# Patient Record
Sex: Male | Born: 1965 | Race: Black or African American | Hispanic: No | Smoking: Current every day smoker
Health system: Southern US, Community
[De-identification: ages and names within clinical notes are randomized; demographics above are authoritative.]

## PROBLEM LIST (undated history)

## (undated) DIAGNOSIS — IMO0002 Reserved for concepts with insufficient information to code with codable children: Secondary | ICD-10-CM

## (undated) DIAGNOSIS — R7989 Other specified abnormal findings of blood chemistry: Secondary | ICD-10-CM

## (undated) DIAGNOSIS — M1990 Unspecified osteoarthritis, unspecified site: Secondary | ICD-10-CM

## (undated) DIAGNOSIS — R634 Abnormal weight loss: Secondary | ICD-10-CM

## (undated) DIAGNOSIS — R945 Abnormal results of liver function studies: Secondary | ICD-10-CM

## (undated) DIAGNOSIS — K859 Acute pancreatitis without necrosis or infection, unspecified: Secondary | ICD-10-CM

## (undated) DIAGNOSIS — K219 Gastro-esophageal reflux disease without esophagitis: Secondary | ICD-10-CM

## (undated) DIAGNOSIS — F1010 Alcohol abuse, uncomplicated: Secondary | ICD-10-CM

## (undated) DIAGNOSIS — R51 Headache: Secondary | ICD-10-CM

## (undated) DIAGNOSIS — S62101A Fracture of unspecified carpal bone, right wrist, initial encounter for closed fracture: Secondary | ICD-10-CM

## (undated) DIAGNOSIS — F419 Anxiety disorder, unspecified: Secondary | ICD-10-CM

## (undated) DIAGNOSIS — I10 Essential (primary) hypertension: Secondary | ICD-10-CM

## (undated) DIAGNOSIS — G4700 Insomnia, unspecified: Secondary | ICD-10-CM

## (undated) DIAGNOSIS — H543 Unqualified visual loss, both eyes: Secondary | ICD-10-CM

## (undated) DIAGNOSIS — N390 Urinary tract infection, site not specified: Secondary | ICD-10-CM

## (undated) HISTORY — PX: EYE SURGERY: SHX253

## (undated) HISTORY — DX: Other specified abnormal findings of blood chemistry: R79.89

## (undated) HISTORY — PX: UPPER GASTROINTESTINAL ENDOSCOPY: SHX188

## (undated) HISTORY — PX: FRACTURE SURGERY: SHX138

## (undated) HISTORY — PX: ESOPHAGOGASTRODUODENOSCOPY: SHX1529

## (undated) HISTORY — DX: Abnormal results of liver function studies: R94.5

## (undated) HISTORY — PX: OTHER SURGICAL HISTORY: SHX169

## (undated) HISTORY — PX: MULTIPLE TOOTH EXTRACTIONS: SHX2053

## (undated) HISTORY — PX: COLONOSCOPY: SHX174

---

## 2000-01-17 ENCOUNTER — Emergency Department (HOSPITAL_COMMUNITY): Admission: EM | Admit: 2000-01-17 | Discharge: 2000-01-17 | Payer: Self-pay | Admitting: Emergency Medicine

## 2000-01-18 ENCOUNTER — Ambulatory Visit (HOSPITAL_COMMUNITY): Admission: RE | Admit: 2000-01-18 | Discharge: 2000-01-18 | Payer: Self-pay | Admitting: Ophthalmology

## 2000-10-27 ENCOUNTER — Encounter: Payer: Self-pay | Admitting: Emergency Medicine

## 2000-10-27 ENCOUNTER — Emergency Department (HOSPITAL_COMMUNITY): Admission: EM | Admit: 2000-10-27 | Discharge: 2000-10-27 | Payer: Self-pay | Admitting: Emergency Medicine

## 2000-12-09 ENCOUNTER — Emergency Department (HOSPITAL_COMMUNITY): Admission: EM | Admit: 2000-12-09 | Discharge: 2000-12-09 | Payer: Self-pay | Admitting: Emergency Medicine

## 2003-08-12 ENCOUNTER — Emergency Department (HOSPITAL_COMMUNITY): Admission: EM | Admit: 2003-08-12 | Discharge: 2003-08-12 | Payer: Self-pay | Admitting: Emergency Medicine

## 2004-03-16 ENCOUNTER — Emergency Department (HOSPITAL_COMMUNITY): Admission: EM | Admit: 2004-03-16 | Discharge: 2004-03-16 | Payer: Self-pay | Admitting: Emergency Medicine

## 2004-03-30 ENCOUNTER — Encounter: Admission: RE | Admit: 2004-03-30 | Discharge: 2004-06-04 | Payer: Self-pay | Admitting: Sports Medicine

## 2004-06-05 ENCOUNTER — Encounter: Admission: RE | Admit: 2004-06-05 | Discharge: 2004-07-05 | Payer: Self-pay | Admitting: Sports Medicine

## 2005-03-15 ENCOUNTER — Ambulatory Visit: Payer: Self-pay | Admitting: Family Medicine

## 2005-03-15 ENCOUNTER — Emergency Department (HOSPITAL_COMMUNITY): Admission: EM | Admit: 2005-03-15 | Discharge: 2005-03-15 | Payer: Self-pay | Admitting: *Deleted

## 2005-03-22 ENCOUNTER — Emergency Department (HOSPITAL_COMMUNITY): Admission: EM | Admit: 2005-03-22 | Discharge: 2005-03-22 | Payer: Self-pay | Admitting: Emergency Medicine

## 2005-12-14 ENCOUNTER — Ambulatory Visit: Payer: Self-pay | Admitting: Family Medicine

## 2006-01-19 ENCOUNTER — Ambulatory Visit: Payer: Self-pay | Admitting: Family Medicine

## 2006-02-02 ENCOUNTER — Ambulatory Visit: Payer: Self-pay | Admitting: Family Medicine

## 2006-08-14 ENCOUNTER — Emergency Department (HOSPITAL_COMMUNITY): Admission: EM | Admit: 2006-08-14 | Discharge: 2006-08-14 | Payer: Self-pay | Admitting: Emergency Medicine

## 2008-06-26 ENCOUNTER — Emergency Department (HOSPITAL_COMMUNITY): Admission: EM | Admit: 2008-06-26 | Discharge: 2008-06-26 | Payer: Self-pay | Admitting: Family Medicine

## 2009-03-10 ENCOUNTER — Emergency Department (HOSPITAL_COMMUNITY): Admission: EM | Admit: 2009-03-10 | Discharge: 2009-03-10 | Payer: Self-pay | Admitting: Emergency Medicine

## 2009-08-12 ENCOUNTER — Encounter: Admission: RE | Admit: 2009-08-12 | Discharge: 2009-08-12 | Payer: Self-pay | Admitting: Family Medicine

## 2010-08-01 ENCOUNTER — Inpatient Hospital Stay (INDEPENDENT_AMBULATORY_CARE_PROVIDER_SITE_OTHER)
Admission: RE | Admit: 2010-08-01 | Discharge: 2010-08-01 | Disposition: A | Discharge status: Home / Self Care | Payer: Medicare Other | Source: Ambulatory Visit | Attending: Family Medicine | Admitting: Family Medicine

## 2010-08-01 ENCOUNTER — Ambulatory Visit (INDEPENDENT_AMBULATORY_CARE_PROVIDER_SITE_OTHER): Payer: Medicare Other

## 2010-08-01 DIAGNOSIS — S52509A Unspecified fracture of the lower end of unspecified radius, initial encounter for closed fracture: Secondary | ICD-10-CM

## 2010-08-01 DIAGNOSIS — S52609A Unspecified fracture of lower end of unspecified ulna, initial encounter for closed fracture: Secondary | ICD-10-CM

## 2010-08-03 ENCOUNTER — Emergency Department (HOSPITAL_COMMUNITY)
Admission: EM | Admit: 2010-08-03 | Discharge: 2010-08-03 | Disposition: A | Discharge status: Home / Self Care | Payer: Medicare Other | Attending: Emergency Medicine | Admitting: Emergency Medicine

## 2010-08-03 DIAGNOSIS — S62109A Fracture of unspecified carpal bone, unspecified wrist, initial encounter for closed fracture: Secondary | ICD-10-CM | POA: Insufficient documentation

## 2010-08-03 DIAGNOSIS — X58XXXA Exposure to other specified factors, initial encounter: Secondary | ICD-10-CM | POA: Insufficient documentation

## 2010-08-03 DIAGNOSIS — Y929 Unspecified place or not applicable: Secondary | ICD-10-CM | POA: Insufficient documentation

## 2010-08-03 DIAGNOSIS — R209 Unspecified disturbances of skin sensation: Secondary | ICD-10-CM | POA: Insufficient documentation

## 2010-08-03 DIAGNOSIS — M79609 Pain in unspecified limb: Secondary | ICD-10-CM | POA: Insufficient documentation

## 2010-09-02 ENCOUNTER — Other Ambulatory Visit (HOSPITAL_COMMUNITY): Payer: Self-pay | Admitting: Orthopedic Surgery

## 2010-09-02 ENCOUNTER — Encounter (HOSPITAL_COMMUNITY)
Admission: RE | Admit: 2010-09-02 | Discharge: 2010-09-02 | Disposition: A | Discharge status: Home / Self Care | Payer: Medicare Other | Source: Ambulatory Visit | Attending: Orthopedic Surgery | Admitting: Orthopedic Surgery

## 2010-09-02 ENCOUNTER — Ambulatory Visit (HOSPITAL_COMMUNITY)
Admission: RE | Admit: 2010-09-02 | Discharge: 2010-09-02 | Disposition: A | Discharge status: Home / Self Care | Payer: Medicare Other | Source: Ambulatory Visit | Attending: Orthopedic Surgery | Admitting: Orthopedic Surgery

## 2010-09-02 DIAGNOSIS — Z01812 Encounter for preprocedural laboratory examination: Secondary | ICD-10-CM | POA: Insufficient documentation

## 2010-09-02 DIAGNOSIS — Z01810 Encounter for preprocedural cardiovascular examination: Secondary | ICD-10-CM | POA: Insufficient documentation

## 2010-09-02 DIAGNOSIS — R52 Pain, unspecified: Secondary | ICD-10-CM

## 2010-09-02 DIAGNOSIS — Z01818 Encounter for other preprocedural examination: Secondary | ICD-10-CM | POA: Insufficient documentation

## 2010-09-02 LAB — COMPREHENSIVE METABOLIC PANEL
ALT: 47 U/L (ref 0–53)
AST: 58 U/L — ABNORMAL HIGH (ref 0–37)
Albumin: 4.1 g/dL (ref 3.5–5.2)
Alkaline Phosphatase: 86 U/L (ref 39–117)
BUN: 6 mg/dL (ref 6–23)
CO2: 29 mEq/L (ref 19–32)
Calcium: 9.3 mg/dL (ref 8.4–10.5)
Chloride: 101 mEq/L (ref 96–112)
Creatinine, Ser: 1.12 mg/dL (ref 0.4–1.5)
GFR calc Af Amer: >60 mL/min (ref >60)
GFR calc non Af Amer: >60 mL/min (ref >60)
Glucose, Bld: 91 mg/dL (ref 70–99)
Potassium: 4.1 mEq/L (ref 3.5–5.1)
Sodium: 136 mEq/L (ref 135–145)
Total Bilirubin: 0.5 mg/dL (ref 0.3–1.2)
Total Protein: 6.8 g/dL (ref 6.0–8.3)

## 2010-09-02 LAB — PROTIME-INR
INR: 0.92 (ref 0.00–1.49)
Prothrombin Time: 12.6 seconds (ref 11.6–15.2)

## 2010-09-02 LAB — APTT: aPTT: 28 seconds (ref 24–37)

## 2010-09-02 LAB — CBC
HCT: 40.5 % (ref 39.0–52.0)
Hemoglobin: 14.6 g/dL (ref 13.0–17.0)
MCH: 32.7 pg (ref 26.0–34.0)
MCHC: 36 g/dL (ref 30.0–36.0)
MCV: 90.8 fL (ref 78.0–100.0)
Platelets: 116 10*3/uL — ABNORMAL LOW (ref 150–400)
RBC: 4.46 MIL/uL (ref 4.22–5.81)
RDW: 13.5 % (ref 11.5–15.5)
WBC: 5.5 10*3/uL (ref 4.0–10.5)

## 2010-09-02 LAB — SURGICAL PCR SCREEN
MRSA, PCR: POSITIVE — AB
Staphylococcus aureus: POSITIVE — AB

## 2010-09-04 ENCOUNTER — Observation Stay (HOSPITAL_COMMUNITY)
Admission: RE | Admit: 2010-09-04 | Discharge: 2010-09-05 | Disposition: A | Discharge status: Home / Self Care | Payer: Medicare Other | Source: Ambulatory Visit | Attending: Orthopedic Surgery | Admitting: Orthopedic Surgery

## 2010-09-04 DIAGNOSIS — F17200 Nicotine dependence, unspecified, uncomplicated: Secondary | ICD-10-CM | POA: Insufficient documentation

## 2010-09-04 DIAGNOSIS — IMO0002 Reserved for concepts with insufficient information to code with codable children: Principal | ICD-10-CM | POA: Insufficient documentation

## 2010-09-04 DIAGNOSIS — R05 Cough: Secondary | ICD-10-CM | POA: Insufficient documentation

## 2010-09-04 DIAGNOSIS — H543 Unqualified visual loss, both eyes: Secondary | ICD-10-CM | POA: Insufficient documentation

## 2010-09-04 DIAGNOSIS — R059 Cough, unspecified: Secondary | ICD-10-CM | POA: Insufficient documentation

## 2010-09-04 DIAGNOSIS — I10 Essential (primary) hypertension: Secondary | ICD-10-CM | POA: Insufficient documentation

## 2010-09-13 NOTE — Op Note (Signed)
  NAMEVARNELL, Austin [name]                 ACCOUNT NO.:  [id number]  MEDICAL RECORD NO.:  [id number]           PATIENT TYPE:  O  LOCATION:  5029                         FACILITY:  [facility]  PHYSICIAN:  Nadara Mustard, MD     DATE OF BIRTH:  03-06-1966  DATE OF PROCEDURE:  09/04/2010 DATE OF DISCHARGE:                              OPERATIVE REPORT   PREOPERATIVE DIAGNOSIS:  Displaced right distal radius fracture.  POSTOPERATIVE DIAGNOSIS:  Displaced right distal radius fracture.  PROCEDURE:  Open reduction and internal fixation of right distal radius.  SURGEON:  Nadara Mustard, MD  ANESTHESIA:  General.  ESTIMATED BLOOD LOSS:  Minimal.  ANTIBIOTICS:  1 g of Kefzol.  DRAINS:  None.  COMPLICATIONS:  None.  TOURNIQUET TIME:  Esmarch at the forearm for approximately 30 minutes.  DISPOSITION:  To PACU in stable condition.  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  He underwent closed reduction and obtained essentially anatomic alignment; however, with the patient's weightbearing on the forearm, he developed shortening and displacement and presents at this time for open reduction and internal fixation due to loss of function of the wrist and fingers. Risks and benefits of surgery were discussed including infection, neurovascular injury, persistent pain, need for additional surgery.  The patient states he understands and wishes to proceed at this time.  DESCRIPTION OF PROCEDURE:  The patient was brought to the OR room 4 and underwent a general anesthetic.  After an adequate level of anesthesia was obtained, the patient's right upper extremity was prepped using DuraPrep and draped into a sterile field.  An anterior volar incision of Sherilyn Cooter was used.  After the skin was incised carried bluntly through the soft tissue.  The skin was incised and blunt dissection was carried down to the FCR.  The FCR  sheath was incised and the FCR as well as the neurovascular bundle were retracted ulnarly.  The quadratus was elevated from the radial aspect of radius and elevated ulnarly.  The fracture was identified and osteotome was used to unlock the displaced shortened distal radius fracture.  This was then brought out to length and the patient's anatomic volar tilt was restored.  The patient did not have a normal volar tilt, this was the anatomic volar tilt of the patient.  The volar plate was applied with locking screws distally x4 and compression screws proximally x3.  C-arm fluoroscopy verified reduction in both AP and lateral planes.  The wound was irrigated with normal saline. Esmarch was released.  Hemostasis was obtained.  The subcu was closed using 2-0 Vicryl and the skin was closed using approximated staples.  The wound was covered with Adaptic, orthopedic sponges, Kerlix, and Coban.  The patient was extubated, taken to PACU in stable condition.  Plan for overnight observation.     Nadara Mustard, MD     MVD/MEDQ  D:  09/04/2010  T:  09/05/2010  Job:  [id number]  Electronically Signed by Aldean Baker MD on 09/13/2010 08:12:00 PM

## 2010-10-23 NOTE — Op Note (Signed)
[place]  Patient:    Austin [name], Austin [name]                          MRN: [id number] Proc. Date: 01/18/00 Adm. Date:  [id number] Disc. Date: [id number] Attending:  Ernesto Rutherford                           Operative Report  PREOPERATIVE DIAGNOSES 1. 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 2. 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  POSTOPERATIVE DIAGNOSES  SURGEON:  Ernesto Rutherford, M.D.  ANESTHESIA:  INDICATION:  Patient had been recently discharged from the "penitentiary" and now tells me upon appearing in the emergency room, that he has no way of getting down to [facility] in no way at all.  His preoperative visual acuity in the emergency room is listed as 20/200ths, but the patient reports that his vision had been better than this.  PLANNED PROCEDURE:  Diagnostic vitreous tap of the left eye with injection of intravitreal antibiotics and possible conjunctival wound revision of the left eye from conjunctival retraction in the area of the superotemporal aqueous shunt.  Patient understands the risks of anesthesia including the rare occurrence of death but also to the eye including hemorrhage, infection, scarring, need for further surgery, no change in vision, loss of vision and progressive disease despite intervention.  DESCRIPTION OF PROCEDURE:  After appropriate signed consent was obtained, patient was taken to the operating room where general endotracheal anesthesia was induced without difficulty.  Left periocular region was sterilely prepped in the usual ophthalmic fashion.  Care was taken not to apply any  pressure to the globe.  Diagnostic vitreous tap was then carried out using aspiration with a 26-gauge needle through the pars plana in the superotemporal quadrant away from the shunt devices which had been placed into the anterior chamber.  A small amount of clear fluid and somewhat cloudy fluid appeared to be present. An anterior chamber tap was also carried out and the hypopyon was removed and sent for laboratory testing and cultures.  At this time, conjunctival wound revision was then carried out.  The conjunctiva was tacked down to the limbus with 7-0 Vicryl suture, with care taken to bury the knot.  Intravitreal antibiotics were then applied; vancomycin 1 mg/0.1 cc concentration in a volume of 0.1 cc and ceftazidime 2.25 mg/0.1 cc in a volume of 0.1 cc were injected into the vitreous cavity without difficulty via the pars plana and 30-gauge needle.  This was an attempt to empirically treat the possible endophthalmitis in this only eye of this patient with a profound ocular problem.  Patient was awakened from anesthesia and taken to the recovery room in good and stable condition.  Patient was given intravenous antibiotics and will be sent home postoperatively in one day and his ocular condition monitored carefully. DD: 04/20/00 TD:  04/21/00 Job: [id number]

## 2011-07-26 ENCOUNTER — Emergency Department (INDEPENDENT_AMBULATORY_CARE_PROVIDER_SITE_OTHER)
Admission: EM | Admit: 2011-07-26 | Discharge: 2011-07-26 | Disposition: A | Discharge status: Nursing Home (Includes ICF) | Payer: Medicare Other | Source: Home / Self Care | Attending: Emergency Medicine | Admitting: Emergency Medicine

## 2011-07-26 ENCOUNTER — Emergency Department (HOSPITAL_COMMUNITY)
Admission: EM | Admit: 2011-07-26 | Discharge: 2011-07-26 | Disposition: A | Discharge status: Home / Self Care | Payer: Medicare Other | Attending: Emergency Medicine | Admitting: Emergency Medicine

## 2011-07-26 ENCOUNTER — Encounter (HOSPITAL_COMMUNITY): Payer: Self-pay | Admitting: Emergency Medicine

## 2011-07-26 DIAGNOSIS — Z9889 Other specified postprocedural states: Secondary | ICD-10-CM | POA: Insufficient documentation

## 2011-07-26 DIAGNOSIS — K5289 Other specified noninfective gastroenteritis and colitis: Secondary | ICD-10-CM | POA: Insufficient documentation

## 2011-07-26 DIAGNOSIS — R319 Hematuria, unspecified: Secondary | ICD-10-CM | POA: Insufficient documentation

## 2011-07-26 DIAGNOSIS — I10 Essential (primary) hypertension: Secondary | ICD-10-CM | POA: Insufficient documentation

## 2011-07-26 DIAGNOSIS — Z87828 Personal history of other (healed) physical injury and trauma: Secondary | ICD-10-CM | POA: Insufficient documentation

## 2011-07-26 DIAGNOSIS — M25539 Pain in unspecified wrist: Secondary | ICD-10-CM | POA: Insufficient documentation

## 2011-07-26 DIAGNOSIS — R197 Diarrhea, unspecified: Secondary | ICD-10-CM | POA: Insufficient documentation

## 2011-07-26 DIAGNOSIS — F329 Major depressive disorder, single episode, unspecified: Secondary | ICD-10-CM

## 2011-07-26 DIAGNOSIS — R112 Nausea with vomiting, unspecified: Secondary | ICD-10-CM | POA: Insufficient documentation

## 2011-07-26 DIAGNOSIS — K529 Noninfective gastroenteritis and colitis, unspecified: Secondary | ICD-10-CM

## 2011-07-26 DIAGNOSIS — R42 Dizziness and giddiness: Secondary | ICD-10-CM | POA: Insufficient documentation

## 2011-07-26 HISTORY — DX: Essential (primary) hypertension: I10

## 2011-07-26 HISTORY — DX: Fracture of unspecified carpal bone, right wrist, initial encounter for closed fracture: S62.101A

## 2011-07-26 LAB — POCT I-STAT, CHEM 8
BUN: 5 mg/dL — ABNORMAL LOW (ref 6–23)
Calcium, Ion: 1.15 mmol/L (ref 1.12–1.32)
Chloride: 100 mEq/L (ref 96–112)
Creatinine, Ser: 1 mg/dL (ref 0.50–1.35)
Glucose, Bld: 96 mg/dL (ref 70–99)
HCT: 45 % (ref 39.0–52.0)
Hemoglobin: 15.3 g/dL (ref 13.0–17.0)
Potassium: 3.8 mEq/L (ref 3.5–5.1)
Sodium: 136 mEq/L (ref 135–145)
TCO2: 25 mmol/L (ref 0–100)

## 2011-07-26 LAB — POCT URINALYSIS DIP (DEVICE)
Bilirubin Urine: NEGATIVE
Glucose, UA: NEGATIVE mg/dL
Ketones, ur: NEGATIVE mg/dL
Leukocytes, UA: NEGATIVE
Nitrite: NEGATIVE
Protein, ur: NEGATIVE mg/dL
Specific Gravity, Urine: 1.01 (ref 1.005–1.030)
Urobilinogen, UA: 0.2 mg/dL (ref 0.0–1.0)
pH: 7 (ref 5.0–8.0)

## 2011-07-26 LAB — URINALYSIS, ROUTINE W REFLEX MICROSCOPIC
Bilirubin Urine: NEGATIVE
Glucose, UA: NEGATIVE mg/dL
Ketones, ur: NEGATIVE mg/dL
Leukocytes, UA: NEGATIVE
Nitrite: NEGATIVE
Protein, ur: NEGATIVE mg/dL
Specific Gravity, Urine: 1.003 — ABNORMAL LOW (ref 1.005–1.030)
Urobilinogen, UA: 0.2 mg/dL (ref 0.0–1.0)
pH: 6.5 (ref 5.0–8.0)

## 2011-07-26 LAB — GLUCOSE, CAPILLARY: Glucose-Capillary: 98 mg/dL (ref 70–99)

## 2011-07-26 LAB — CBC
HCT: 39.6 % (ref 39.0–52.0)
Hemoglobin: 14.1 g/dL (ref 13.0–17.0)
MCH: 32.9 pg (ref 26.0–34.0)
MCHC: 35.6 g/dL (ref 30.0–36.0)
MCV: 92.3 fL (ref 78.0–100.0)
Platelets: 128 10*3/uL — ABNORMAL LOW (ref 150–400)
RBC: 4.29 MIL/uL (ref 4.22–5.81)
RDW: 13.3 % (ref 11.5–15.5)
WBC: 8.3 10*3/uL (ref 4.0–10.5)

## 2011-07-26 LAB — BASIC METABOLIC PANEL
BUN: 6 mg/dL (ref 6–23)
Creatinine, Ser: 0.85 mg/dL (ref 0.50–1.35)
GFR calc Af Amer: >90 mL/min (ref >90)
GFR calc non Af Amer: >90 mL/min (ref >90)
Glucose, Bld: 93 mg/dL (ref 70–99)

## 2011-07-26 LAB — URINE MICROSCOPIC-ADD ON: Urine-Other: NONE SEEN

## 2011-07-26 MED ORDER — SODIUM CHLORIDE 0.9 % IV BOLUS (SEPSIS)
1000.0000 mL | Freq: Once | INTRAVENOUS | Status: AC
  Administered 2011-07-26: 1000 mL via INTRAVENOUS

## 2011-07-26 MED ORDER — OXYCODONE-ACETAMINOPHEN 5-325 MG PO TABS: 1.0000 | ORAL_TABLET | ORAL | Status: DC | PRN

## 2011-07-26 MED ORDER — ONDANSETRON 4 MG PO TBDP
ORAL_TABLET | ORAL | Status: AC
  Filled 2011-07-26: qty 1

## 2011-07-26 MED ORDER — SODIUM CHLORIDE 0.9 % IV SOLN
INTRAVENOUS | Status: DC
  Administered 2011-07-26: 17:00:00 via INTRAVENOUS

## 2011-07-26 MED ORDER — ONDANSETRON HCL 4 MG/2ML IJ SOLN
4.0000 mg | Freq: Once | INTRAMUSCULAR | Status: AC
  Administered 2011-07-26: 4 mg via INTRAVENOUS
  Filled 2011-07-26: qty 2

## 2011-07-26 MED ORDER — ONDANSETRON 4 MG PO TBDP
4.0000 mg | ORAL_TABLET | Freq: Once | ORAL | Status: AC
  Administered 2011-07-26: 4 mg via ORAL

## 2011-07-26 NOTE — ED Notes (Signed)
Home health nurse and social worker at bedside.

## 2011-07-26 NOTE — ED Notes (Signed)
Pt sent here from [facility] with hematuria and N/V and fatigue x several days; pt sts was supposed to be taking meds for kidney infection but can not afford; per [facility] c/o about pt being depressed; pt blind

## 2011-07-26 NOTE — ED Notes (Signed)
Patient was sent to obtain a urine specimen , patient could not void.

## 2011-07-26 NOTE — Progress Notes (Signed)
Request made by attending Dr. Effie Shy to obtain home assistance for patient. I met with patient who stated he used to have in home assistance but he lost his Medicaid last year, and has just now been reinstated. Patient further states his ADL's are very limited due to the addition of a severe break to his arm. States he saw Dr. Lajoyce Corners last week and is waiting to hear on a surgery date. For transportation, he relies on [employer] or the possible assistance from a friend to give him a ride. Patient does work for [employer] full-time, but he has been having some problems going to work recently. With patient's permission, I have requested [facility] to meet with patient for evaluations in the home. Dr. Effie Shy is aware of orders necessary for RN, PT, SW. I have left a detailed message with Anibal Henderson of [facility]. I contacted SW Lennox Laity who will meet with patient also.

## 2011-07-26 NOTE — ED Notes (Signed)
PT HERE WITH H/A THAT STARTED X 3 DYS AGO FOLLOWED BY DIARRHEA YESTERDAY AND VOMITING THIS  AM FROM 1-4AM ,WEAKNESS AND FATIGUE.PT HAS HX HTN BUT HASN'T TAKEN MEDS X 1 MNTH.BLURRY VISIO NAND SHAKES ALSO REPORTED.WILL CHECK CBG.BP 164/90

## 2011-07-26 NOTE — ED Provider Notes (Signed)
History     CSN: [id number]  Arrival date & time 07/26/11  0825   First MD Initiated Contact with Patient 07/26/11 [id number]      Chief Complaint  Patient presents with  . GI Problem  . Headache  . Extremity Weakness    (Consider location/radiation/quality/duration/timing/severity/associated sxs/prior treatment) HPI Comments: Patient presents with decreased appetite, malaise over several weeks. Patient states that he has "a lot going on," and is not eating much during this time. States that he is having trouble taking care of himself secondary to degenerating vision, continued pain and difficulty using his right wrist status post fracture last year.  States he lives by himself, and has nobody to take care of him. Patient reports 20-30 pound weight loss during this time, inability to sleep- states he has not slept in over 2 days - decreased energy, loss of interest in usual activities. Reports generalized weakness, headache starting several days ago. Reports multiple episodes of in nbnb vomiting this pain. No abdominal pain. Last bowel movement this morning, was within normal limits for him. No urinary urgency, frequency, dysuria, decreased urine. Patient was recently diagnosed with a "kidney infection" last week, and was prescribed Cipro, which  He was unable to afford, and did not fill this States he ran out of his blood pressure medication month ago. Patient does not remember name medication.  Patient is a 46 y.o. male presenting with weakness. The history is provided by the patient. No language interpreter was used.  Weakness The primary symptoms include nausea and vomiting. Primary symptoms do not include fever. The symptoms began more than 1 week ago.  Additional symptoms include weakness.    Past Medical History  Diagnosis Date  . Hypertension   . Glaucoma   . Wrist fracture, right     Past Surgical History  Procedure Date  . Eye surgery   . Fracture surgery     History  reviewed. No pertinent family history.  History  Substance Use Topics  . Smoking status: Current Everyday Smoker  . Smokeless tobacco: Not on file  . Alcohol Use: Yes      Review of Systems  Constitutional: Positive for appetite change and unexpected weight change. Negative for fever.  Respiratory: Negative for cough, shortness of breath and wheezing.   Cardiovascular: Negative for chest pain.  Gastrointestinal: Positive for nausea and vomiting. Negative for abdominal pain, diarrhea and constipation.  Genitourinary: Negative for dysuria, frequency and decreased urine volume.  Neurological: Positive for weakness.    Allergies  Review of patient's allergies indicates no known allergies.  Home Medications   Current Outpatient Rx  Name Route Sig Dispense Refill  . UNKNOWN TO PATIENT        BP 164/90  Pulse 78  Temp(Src) 98.7 F (37.1 C) (Oral)  Resp 16  SpO2 99%  Physical Exam  Nursing note and vitals reviewed. Constitutional: He is oriented to person, place, and time. He appears well-developed and well-nourished.  HENT:  Head: Normocephalic and atraumatic.  Eyes:       Blind in left eye.  Neck: Normal range of motion.  Cardiovascular: Normal rate, regular rhythm, normal heart sounds and intact distal pulses.   Pulmonary/Chest: Effort normal and breath sounds normal.  Abdominal: Soft. Bowel sounds are normal. He exhibits no distension and no mass. There is no tenderness. There is no rebound, no guarding and no CVA tenderness.  Musculoskeletal: Normal range of motion. He exhibits no edema.  Neurological: He is alert and oriented  to person, place, and time.  Skin: Skin is warm and dry. No rash noted.  Psychiatric: His speech is normal and behavior is normal. Judgment and thought content normal. He exhibits a depressed mood.    ED Course  Procedures (including critical care time)  Labs Reviewed  CBC - Abnormal; Notable for the following:    Platelets 128 (*)     All other components within normal limits  POCT I-STAT, CHEM 8 - Abnormal; Notable for the following:    BUN 5 (*)    All other components within normal limits  POCT URINALYSIS DIP (DEVICE) - Abnormal; Notable for the following:    Hgb urine dipstick LARGE (*)    All other components within normal limits  GLUCOSE, CAPILLARY   No results found.   1. Hematuria   2. Depression    Results for orders placed during the hospital encounter of 07/26/11  GLUCOSE, CAPILLARY      Component Value Range   Glucose-Capillary 98  70 - 99 (mg/dL)  CBC      Component Value Range   WBC 8.3  4.0 - 10.5 (K/uL)   RBC 4.29  4.22 - 5.81 (MIL/uL)   Hemoglobin 14.1  13.0 - 17.0 (g/dL)   HCT 78.2  95.6 - 21.3 (%)   MCV 92.3  78.0 - 100.0 (fL)   MCH 32.9  26.0 - 34.0 (pg)   MCHC 35.6  30.0 - 36.0 (g/dL)   RDW 08.6  57.8 - 46.9 (%)   Platelets 128 (*) 150 - 400 (K/uL)  POCT I-STAT, CHEM 8      Component Value Range   Sodium 136  135 - 145 (mEq/L)   Potassium 3.8  3.5 - 5.1 (mEq/L)   Chloride 100  96 - 112 (mEq/L)   BUN 5 (*) 6 - 23 (mg/dL)   Creatinine, Ser 6.29  0.50 - 1.35 (mg/dL)   Glucose, Bld 96  70 - 99 (mg/dL)   Calcium, Ion 5.28  4.13 - 1.32 (mmol/L)   TCO2 25  0 - 100 (mmol/L)   Hemoglobin 15.3  13.0 - 17.0 (g/dL)   HCT 24.4  01.0 - 27.2 (%)  POCT URINALYSIS DIP (DEVICE)      Component Value Range   Glucose, UA NEGATIVE  NEGATIVE (mg/dL)   Bilirubin Urine NEGATIVE  NEGATIVE    Ketones, ur NEGATIVE  NEGATIVE (mg/dL)   Specific Gravity, Urine 1.010  1.005 - 1.030    Hgb urine dipstick LARGE (*) NEGATIVE    pH 7.0  5.0 - 8.0    Protein, ur NEGATIVE  NEGATIVE (mg/dL)   Urobilinogen, UA 0.2  0.0 - 1.0 (mg/dL)   Nitrite NEGATIVE  NEGATIVE    Leukocytes, UA NEGATIVE  NEGATIVE     .  MDM  Previous records reviewed.  No record of name of blood pressure medicine.  Labs reviewed. Hemoglobin unchanged, electrolytes unchanged. Patient with hematuria. Suspect that this is from untreated  UTI, however, may also be from kidney stone. Is very nauseous here, gave Zofran. Patient tolerating small amounts of soda.  Concern for patient's ability to adequately take care of himself. Patient appears very depressed, but has no explicit suicidal ideation. Transferring to the ER for social work consult, possible behavior health evaluation.   Luiz Blare, MD 07/26/11 1115

## 2011-07-26 NOTE — ED Notes (Signed)
Spoke with pt re: various issues.  Pt denies homicidal/suicidal ideations.  He just wants to feel better physically .  Discussed HH arrangements, pt agreeable.  Pt states that he he has [employer] transportation and is employed by [employer].  Pt has Medicare/Medicaid and will be able to get his meds filled.  Pt says his girlfriend had been getting his groceries, but they had recently broken up.  Food given to pt to take home.  Emotional support offered.

## 2011-07-26 NOTE — Discharge Instructions (Signed)
Stay on a clear liquid diet for one to 2 days.  The home health services will come to your house tomorrow to help you with the problems you have with fixing your foodB.R.A.T. Diet Your doctor has recommended the B.R.A.T. diet for you or your child until the condition improves. This is often used to help control diarrhea and vomiting symptoms. If you or your child can tolerate clear liquids, you may have:  Bananas.   Rice.   Applesauce.   Toast (and other simple starches such as crackers, potatoes, noodles).  Be sure to avoid dairy products, meats, and fatty foods until symptoms are better. Fruit juices such as apple, grape, and prune juice can make diarrhea worse. Avoid these. Continue this diet for 2 days or as instructed by your caregiver. Document Released: 05/24/2005 Document Revised: 02/03/2011 Document Reviewed: 11/10/2006 [facility] Patient Information 2012 [place], [place].Clear Liquid Diet The clear liquid dietconsists of foods that are liquid or will become liquid at room temperature.You should be able to see through the liquid and beverages. Examples of foods allowed on a clear liquid diet include fruit juice, broth or bouillon, gelatin, or frozen ice pops. The purpose of this diet is to provide necessary fluid, electrolytes such a sodium and potassium, and energy to keep the body functioning during times when you are not able to consume a regular diet.A clear liquid diet should not be continued for long periods of time as it is not nutritionally adequate.  REASONS FOR USING A CLEAR LIQUID DIET  In sudden onset (acute) conditions for a patient before or after surgery.   As the first step in oral feeding.   For fluid and electrolyte replacement in diarrheal diseases.   As a diet before certain medical tests are performed.  ADEQUACY The clear liquid diet is adequate only in ascorbic acid, according to the Recommended Dietary Allowances of the [employer]. CHOOSING  FOODS Breads and Starches  Allowed:  None are allowed.   Avoid: All are avoided.  Vegetables  Allowed:  Strained tomato or vegetable juice.   Avoid: Any others.  Fruit  Allowed:  Strained fruit juices and fruit drinks. Include 1 serving of citrus or vitamin C-enriched fruit juice daily.   Avoid: Any others.  Meat and Meat Substitutes  Allowed:  None are allowed.   Avoid: All are avoided.  Milk  Allowed:  None are allowed.   Avoid: All are avoided.  Soups and Combination Foods  Allowed:  Clear bouillon, broth, or strained broth-based soups.   Avoid: Any others.  Desserts and Sweets  Allowed:  Sugar, honey. High protein gelatin. Flavored gelatin, ices, or frozen ice pops that do not contain milk.   Avoid: Any others.  Fats and Oils  Allowed:  None are allowed.   Avoid: All are avoided.  Beverages  Allowed:  Carbonated beverages, cereal beverages, coffee (regular or decaffeinated), or tea.   Avoid: Any others.  Condiments  Allowed:  Iodized salt.   Avoid: Any others, including pepper.  Supplements  Allowed:  Liquid nutrition beverages.   Avoid: Any others that contain lactose or fiber.  SAMPLE MEAL PLAN Breakfast  4 oz strained orange juice.    to 1 cup gelatin (plain or fortified).   1 cup beverage (coffee or tea).   Sugar, if desired.  Midmorning Snack   cup gelatin (plain or fortified).  Lunch  1 cup broth or consomm.   4 oz strained grapefruit juice.    cup gelatin (plain or  fortified).   1 cup beverage (coffee or tea).   Sugar, if desired.  Midafternoon Snack   cup fruit ice.    cup strained fruit juice.  Dinner  1 cup broth or consomm.    cup cranberry juice.    cup flavored gelatin (plain or fortified).   1 cup beverage (coffee or tea).   Sugar, if desired.  Evening Snack  4 oz strained apple juice (vitamin C-fortified).    cup flavored gelatin (plain or fortified).  Document Released: 05/24/2005  Document Revised: 02/03/2011 Document Reviewed: 08/21/2010 [facility] Patient Information 2012 [place], [place]., and the wrist pain. See the doctor of your choice for problems. Return here if needed for problems.  RESOURCE GUIDE  Dental Problems  Patients with Medicaid: [facility] [phone] [address]                                           [phone] [address] Phone:  [phone]                                                  Phone:  [phone]  If unable to pay or uninsured, contact:  [facility] or [facility] to become qualified for the adult dental clinic.  Chronic Pain Problems Contact [facility]  [phone] Patients need to be referred by their primary care doctor.  Insufficient Money for Medicine Contact [employer] Way:  call "211" or [facility] 769-787-6193.  No Primary Care Doctor Call [facility]  [phone] Other agencies that provide inexpensive medical care    [facility]  [phone]    [facility]  [phone]    [facility]  [phone]    [facility]  [phone]    [facility]  [phone]    [facility]  [phone]  Psychological Services [facility] Behavioral Health  [phone] [facility]  [phone] [facility] Mental Health   [phone] (emergency services [phone])  Substance Abuse Resources [facility] and Drug Services  [phone] [facility] [phone] [facility] [phone] Floyd[facility] Flock [phone] Residential & Outpatient Substance Abuse Program  6143645327  Abuse/Neglect [facility] Child Abuse Hotline 512 872 3240 [facility] Child Abuse Hotline (541)719-8881 (After Hours)  Emergency Shelter [facility] (443)380-5917  Maternity Homes [facility] at the [place] [facility] ([phone] [facility] ([phone]  MRSA Hotline #:    (857)203-0581    [facility] Resources  [facility]     [employer]                          [facility] Dept. [address] [place]                       [address]      [address]  [address]  Cristobal Goldmann Phone:  [phone]                                   Phone:  [phone]                 Phone:  [phone]  [facility] Mental Health Phone:  [phone]  [facility] Child Abuse Hotline 413 024 4992 [phone] (After Hours)

## 2011-07-26 NOTE — ED Notes (Signed)
Pt states he has been weak and feels unsteady. He states he has not eaten in 2 days. He is blind and unable to cook for him self. States he saw the nurse at his job last week and was told he had a UTI. He was given a RX for abx but was unable to fill it due to financial reasons.

## 2011-07-26 NOTE — ED Notes (Signed)
Patient given crackers and water per EDP order for PO challenge Patient verbalized understanding.

## 2011-07-26 NOTE — ED Provider Notes (Addendum)
History     CSN: [id number]  Arrival date & time 07/26/11  1149   First MD Initiated Contact with Patient 07/26/11 1426      Chief Complaint  Patient presents with  . Hematuria    (Consider location/radiation/quality/duration/timing/severity/associated sxs/prior treatment) HPI Austin [name] is a 46 y.o. male who presents with nausea, vomiting, diarrhea, and dizziness that started today. 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 He has had help in the home, but none recently. He usually eats when he is at work, during the weekdays, but not on weekends because he cannot prepare his food. He was reportedly diagnosed with a urinary tract infection about 2 weeks ago, was prescribed medication, but did not take it. He states that he had no transportation to allow him to get the medication. Patient is unable to see. The color of his emesis or urine. He was dropped off at the hospital facility today by his sister. He reports stress, but denies depression. He has no flank pain or abdominal pain. He has no fever or chills. He is able to ambulate without falling. He has not tried any medication to improve his symptoms today.    Past Medical History  Diagnosis Date  . Hypertension   . Glaucoma   . Wrist fracture, right     Past Surgical History  Procedure Date  . Eye surgery   . Fracture surgery     History reviewed. No pertinent family history.  History  Substance Use Topics  . Smoking status: Current Everyday Smoker  . Smokeless tobacco: Not on file  . Alcohol Use: Yes      Review of Systems  All other systems reviewed and are negative.    Allergies  Review of patient's allergies indicates no known allergies.  Home Medications   Current Outpatient Rx  Name Route Sig Dispense Refill  . HYDROCODONE-ACETAMINOPHEN 5-325 MG PO TABS Oral Take 2 tablets by mouth every 6 (six) hours as  needed. For pain    . HYDROCORTISONE 1 % EX CREA Topical Apply 1 application topically 2 (two) times daily.      BP 162/90  Pulse 74  Temp(Src) 99.3 F (37.4 C) (Oral)  Resp 18  SpO2 98%  Physical Exam  Nursing note and vitals reviewed. Constitutional: He is oriented to person, place, and time. He appears well-developed and well-nourished.  HENT:  Head: Normocephalic and atraumatic.  Right Ear: External ear normal.  Left Ear: External ear normal.  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  Neck: Normal range of motion and phonation normal. Neck supple.  Cardiovascular: Normal rate, regular rhythm, normal heart sounds and intact distal pulses.   Pulmonary/Chest: Effort normal and breath sounds normal. He exhibits no bony tenderness.  Abdominal: Soft. Normal appearance and bowel sounds are normal. He exhibits no mass. There is no tenderness. There is no guarding.  Musculoskeletal: Normal range of motion.  Neurological: He is alert and oriented to person, place, and time. He has normal strength. No sensory deficit. He exhibits normal muscle tone. Coordination normal.  Skin: Skin is warm, dry and intact.  Psychiatric: He has a normal mood and affect. His behavior is normal. Judgment and thought content normal.    ED Course  Procedures (including critical care time)  Labs  Reviewed  URINALYSIS, ROUTINE W REFLEX MICROSCOPIC - Abnormal; Notable for the following:    Specific Gravity, Urine 1.003 (*)    Hgb urine dipstick MODERATE (*)    All other components within normal limits  URINE MICROSCOPIC-ADD ON  BASIC METABOLIC PANEL  CK TOTAL AND CKMB  URINALYSIS, ROUTINE W REFLEX MICROSCOPIC  URINE CULTURE   No results found. The patient was evaluated in the emergency department with social services. We arranged for him to be assessed in his home by home health for social work PT and nursing evaluation.  1.  Gastroenteritis   2. 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 He may have a gastroenteritis. today, did not vomit or have diarrhea. In emergency department. He was able to tolerate crackers and water in emergency department. Home health services have been arranged for further assessment and to get referrals as needed. Patient stable for discharge. Doubt metabolic instability, serious bacterial infection or colitis        Flint Melter, MD 07/26/11 2032  Flint Melter, MD 07/26/11 2033

## 2011-07-27 ENCOUNTER — Other Ambulatory Visit (HOSPITAL_COMMUNITY): Payer: Self-pay | Admitting: Orthopedic Surgery

## 2011-07-27 LAB — URINE CULTURE
Colony Count: NO GROWTH
Culture  Setup Time: 201302182303
Culture: NO GROWTH

## 2011-08-03 ENCOUNTER — Encounter (HOSPITAL_COMMUNITY): Payer: Self-pay | Admitting: Pharmacy Technician

## 2011-08-03 NOTE — Pre-Procedure Instructions (Signed)
20 Austin [name]  08/03/2011   Your procedure is scheduled on:  Friday, March 1  Report to [facility] at 9:40 AM.  Call this number if you have problems the morning of surgery: [phone]   Remember:   Do not eat food:After Midnight.  May have clear liquids: up to 4 Hours before arrival.  Clear liquids include soda, tea, black coffee, apple or grape juice, broth.  Take these medicines the morning of surgery with A SIP OF WATER: Norco pain pill, Cipro   Do not wear jewelry, make-up or nail polish.  Do not wear lotions, powders, or perfumes. You may wear deodorant.  Do not shave 48 hours prior to surgery.  Do not bring valuables to the hospital.  Contacts, dentures or bridgework may not be worn into surgery.  Leave suitcase in the car. After surgery it may be brought to your room.  For patients admitted to the hospital, checkout time is 11:00 AM the day of discharge.   Patients discharged the day of surgery will not be allowed to drive home.  Name and phone number of your driver:   Special Instructions: Incentive Spirometry - Practice and bring it with you on the day of surgery. and CHG Shower Use Special Wash: 1/2 bottle night before surgery and 1/2 bottle morning of surgery.   Please read over the following fact sheets that you were given: Pain Booklet, Coughing and Deep Breathing and Surgical Site Infection Prevention

## 2011-08-04 ENCOUNTER — Encounter (HOSPITAL_COMMUNITY)
Admission: RE | Admit: 2011-08-04 | Discharge: 2011-08-04 | Disposition: A | Discharge status: Home / Self Care | Payer: Medicare Other | Source: Ambulatory Visit | Attending: Orthopedic Surgery | Admitting: Orthopedic Surgery

## 2011-08-04 ENCOUNTER — Encounter (HOSPITAL_COMMUNITY): Payer: Self-pay

## 2011-08-04 DIAGNOSIS — N390 Urinary tract infection, site not specified: Secondary | ICD-10-CM

## 2011-08-04 HISTORY — DX: Unspecified osteoarthritis, unspecified site: M19.90

## 2011-08-04 HISTORY — DX: Headache: R51

## 2011-08-04 HISTORY — DX: Urinary tract infection, site not specified: N39.0

## 2011-08-04 HISTORY — DX: Anxiety disorder, unspecified: F41.9

## 2011-08-04 HISTORY — DX: Unqualified visual loss, both eyes: H54.3

## 2011-08-04 HISTORY — DX: Reserved for concepts with insufficient information to code with codable children: IMO0002

## 2011-08-04 HISTORY — DX: Abnormal weight loss: R63.4

## 2011-08-04 LAB — COMPREHENSIVE METABOLIC PANEL
Albumin: 3.6 g/dL (ref 3.5–5.2)
Alkaline Phosphatase: 114 U/L (ref 39–117)
BUN: 11 mg/dL (ref 6–23)
Chloride: 103 mEq/L (ref 96–112)
Creatinine, Ser: 0.98 mg/dL (ref 0.50–1.35)
GFR calc Af Amer: >90 mL/min (ref >90)
Glucose, Bld: 114 mg/dL — ABNORMAL HIGH (ref 70–99)
Potassium: 4.2 mEq/L (ref 3.5–5.1)
Total Bilirubin: 0.3 mg/dL (ref 0.3–1.2)

## 2011-08-04 LAB — CBC
HCT: 39.3 % (ref 39.0–52.0)
Hemoglobin: 14.1 g/dL (ref 13.0–17.0)
MCHC: 35.9 g/dL (ref 30.0–36.0)
MCV: 91.4 fL (ref 78.0–100.0)
RDW: 13 % (ref 11.5–15.5)
WBC: 4.9 10*3/uL (ref 4.0–10.5)

## 2011-08-04 LAB — PROTIME-INR
INR: 0.9 (ref 0.00–1.49)
Prothrombin Time: 12.3 seconds (ref 11.6–15.2)

## 2011-08-04 LAB — SURGICAL PCR SCREEN: Staphylococcus aureus: NEGATIVE

## 2011-08-04 NOTE — Progress Notes (Signed)
Notified Dr. Audrie Lia office that pt most likely does not have anyone to stay with him after surgery and he is concerned about how he will manage on his own as he is already having difficulty managing.   Encouraged pt to follow-up with social worker re: assistance.

## 2011-08-05 MED ORDER — CEFAZOLIN SODIUM 1-5 GM-% IV SOLN
1.0000 g | INTRAVENOUS | Status: AC
  Administered 2011-08-06: 1 g via INTRAVENOUS
  Filled 2011-08-05: qty 50

## 2011-08-06 ENCOUNTER — Encounter (HOSPITAL_COMMUNITY): Payer: Self-pay | Admitting: Anesthesiology

## 2011-08-06 ENCOUNTER — Ambulatory Visit (HOSPITAL_COMMUNITY): Payer: Medicare Other | Admitting: Anesthesiology

## 2011-08-06 ENCOUNTER — Ambulatory Visit (HOSPITAL_COMMUNITY)
Admission: RE | Admit: 2011-08-06 | Discharge: 2011-08-07 | Disposition: A | Discharge status: Home / Self Care | Payer: Medicare Other | Source: Ambulatory Visit | Attending: Orthopedic Surgery | Admitting: Orthopedic Surgery

## 2011-08-06 ENCOUNTER — Encounter (HOSPITAL_COMMUNITY)
Admission: RE | Disposition: A | Discharge status: Home / Self Care | Payer: Self-pay | Source: Ambulatory Visit | Attending: Orthopedic Surgery

## 2011-08-06 DIAGNOSIS — I10 Essential (primary) hypertension: Secondary | ICD-10-CM | POA: Insufficient documentation

## 2011-08-06 DIAGNOSIS — M129 Arthropathy, unspecified: Secondary | ICD-10-CM | POA: Insufficient documentation

## 2011-08-06 DIAGNOSIS — T85848A Pain due to other internal prosthetic devices, implants and grafts, initial encounter: Secondary | ICD-10-CM

## 2011-08-06 DIAGNOSIS — F411 Generalized anxiety disorder: Secondary | ICD-10-CM | POA: Insufficient documentation

## 2011-08-06 DIAGNOSIS — F1210 Cannabis abuse, uncomplicated: Secondary | ICD-10-CM | POA: Insufficient documentation

## 2011-08-06 DIAGNOSIS — H543 Unqualified visual loss, both eyes: Secondary | ICD-10-CM | POA: Insufficient documentation

## 2011-08-06 DIAGNOSIS — Z01812 Encounter for preprocedural laboratory examination: Secondary | ICD-10-CM | POA: Insufficient documentation

## 2011-08-06 DIAGNOSIS — Z969 Presence of functional implant, unspecified: Secondary | ICD-10-CM | POA: Diagnosis present

## 2011-08-06 DIAGNOSIS — F17200 Nicotine dependence, unspecified, uncomplicated: Secondary | ICD-10-CM | POA: Insufficient documentation

## 2011-08-06 DIAGNOSIS — Z7901 Long term (current) use of anticoagulants: Secondary | ICD-10-CM | POA: Insufficient documentation

## 2011-08-06 DIAGNOSIS — Z79899 Other long term (current) drug therapy: Secondary | ICD-10-CM | POA: Insufficient documentation

## 2011-08-06 DIAGNOSIS — Y831 Surgical operation with implant of artificial internal device as the cause of abnormal reaction of the patient, or of later complication, without mention of misadventure at the time of the procedure: Secondary | ICD-10-CM | POA: Insufficient documentation

## 2011-08-06 DIAGNOSIS — M79609 Pain in unspecified limb: Secondary | ICD-10-CM | POA: Insufficient documentation

## 2011-08-06 DIAGNOSIS — T8489XA Other specified complication of internal orthopedic prosthetic devices, implants and grafts, initial encounter: Secondary | ICD-10-CM | POA: Insufficient documentation

## 2011-08-06 HISTORY — PX: HARDWARE REMOVAL: SHX979

## 2011-08-06 SURGERY — REMOVAL, HARDWARE
Anesthesia: General | Site: Wrist | Laterality: Right | Wound class: Clean

## 2011-08-06 MED ORDER — PATIENT'S GUIDE TO USING COUMADIN BOOK
Freq: Once | Status: AC
  Administered 2011-08-07: 09:00:00
  Filled 2011-08-06: qty 1

## 2011-08-06 MED ORDER — ONDANSETRON HCL 4 MG/2ML IJ SOLN: 4.0000 mg | Freq: Once | INTRAMUSCULAR | Status: DC | PRN

## 2011-08-06 MED ORDER — METOCLOPRAMIDE HCL 5 MG/ML IJ SOLN: 5.0000 mg | Freq: Three times a day (TID) | INTRAMUSCULAR | Status: DC | PRN

## 2011-08-06 MED ORDER — ONDANSETRON HCL 4 MG PO TABS: 4.0000 mg | ORAL_TABLET | Freq: Four times a day (QID) | ORAL | Status: DC | PRN

## 2011-08-06 MED ORDER — OXYCODONE-ACETAMINOPHEN 5-325 MG PO TABS
1.0000 | ORAL_TABLET | ORAL | Status: DC | PRN
  Administered 2011-08-06 – 2011-08-07 (×4): 2 via ORAL
  Filled 2011-08-06 (×4): qty 2

## 2011-08-06 MED ORDER — WARFARIN VIDEO: Freq: Once | Status: DC

## 2011-08-06 MED ORDER — HYDROCODONE-ACETAMINOPHEN 5-325 MG PO TABS
1.0000 | ORAL_TABLET | ORAL | Status: DC | PRN
  Administered 2011-08-06 – 2011-08-07 (×2): 2 via ORAL
  Filled 2011-08-06 (×2): qty 2

## 2011-08-06 MED ORDER — HYDROMORPHONE HCL PF 1 MG/ML IJ SOLN
0.5000 mg | INTRAMUSCULAR | Status: DC | PRN
  Administered 2011-08-06 – 2011-08-07 (×4): 1 mg via INTRAVENOUS
  Filled 2011-08-06 (×4): qty 1

## 2011-08-06 MED ORDER — LIDOCAINE HCL (CARDIAC) 20 MG/ML IV SOLN
INTRAVENOUS | Status: DC | PRN
  Administered 2011-08-06: 100 mg via INTRAVENOUS

## 2011-08-06 MED ORDER — ONDANSETRON HCL 4 MG/2ML IJ SOLN
INTRAMUSCULAR | Status: DC | PRN
  Administered 2011-08-06: 4 mg via INTRAVENOUS

## 2011-08-06 MED ORDER — METOCLOPRAMIDE HCL 10 MG PO TABS: 5.0000 mg | ORAL_TABLET | Freq: Three times a day (TID) | ORAL | Status: DC | PRN

## 2011-08-06 MED ORDER — LACTATED RINGERS IV SOLN
INTRAVENOUS | Status: DC
  Administered 2011-08-06: 11:00:00 via INTRAVENOUS

## 2011-08-06 MED ORDER — WARFARIN - PHARMACIST DOSING INPATIENT
Freq: Every day | Status: DC
  Administered 2011-08-06: 18:00:00
  Filled 2011-08-06 (×2): qty 1

## 2011-08-06 MED ORDER — CEFAZOLIN SODIUM 1-5 GM-% IV SOLN
1.0000 g | Freq: Four times a day (QID) | INTRAVENOUS | Status: AC
  Administered 2011-08-06 – 2011-08-07 (×3): 1 g via INTRAVENOUS
  Filled 2011-08-06 (×3): qty 50

## 2011-08-06 MED ORDER — 0.9 % SODIUM CHLORIDE (POUR BTL) OPTIME
TOPICAL | Status: DC | PRN
  Administered 2011-08-06: 1000 mL

## 2011-08-06 MED ORDER — HYDROMORPHONE HCL PF 1 MG/ML IJ SOLN
0.2500 mg | INTRAMUSCULAR | Status: DC | PRN
  Administered 2011-08-06 (×2): 0.5 mg via INTRAVENOUS

## 2011-08-06 MED ORDER — ONDANSETRON HCL 4 MG/2ML IJ SOLN: 4.0000 mg | Freq: Four times a day (QID) | INTRAMUSCULAR | Status: DC | PRN

## 2011-08-06 MED ORDER — WARFARIN SODIUM 7.5 MG PO TABS
7.5000 mg | ORAL_TABLET | Freq: Once | ORAL | Status: DC
  Filled 2011-08-06: qty 1

## 2011-08-06 MED ORDER — SODIUM CHLORIDE 0.9 % IV SOLN: INTRAVENOUS | Status: DC

## 2011-08-06 MED ORDER — MIDAZOLAM HCL 5 MG/5ML IJ SOLN
INTRAMUSCULAR | Status: DC | PRN
  Administered 2011-08-06: 2 mg via INTRAVENOUS

## 2011-08-06 MED ORDER — FENTANYL CITRATE 0.05 MG/ML IJ SOLN
INTRAMUSCULAR | Status: DC | PRN
  Administered 2011-08-06 (×2): 50 ug via INTRAVENOUS
  Administered 2011-08-06: 100 ug via INTRAVENOUS
  Administered 2011-08-06: 50 ug via INTRAVENOUS

## 2011-08-06 MED ORDER — OXYCODONE-ACETAMINOPHEN 5-325 MG PO TABS: 1.0000 | ORAL_TABLET | ORAL | Status: AC | PRN

## 2011-08-06 MED ORDER — LACTATED RINGERS IV SOLN
INTRAVENOUS | Status: DC | PRN
  Administered 2011-08-06 (×2): via INTRAVENOUS

## 2011-08-06 MED ORDER — PROPOFOL 10 MG/ML IV EMUL
INTRAVENOUS | Status: DC | PRN
  Administered 2011-08-06: 200 mg via INTRAVENOUS

## 2011-08-06 SURGICAL SUPPLY — 44 items
BANDAGE ELASTIC 4 VELCRO ST LF (GAUZE/BANDAGES/DRESSINGS) IMPLANT
BANDAGE ELASTIC 6 VELCRO ST LF (GAUZE/BANDAGES/DRESSINGS) IMPLANT
BANDAGE ESMARK 6X9 LF (GAUZE/BANDAGES/DRESSINGS) IMPLANT
BANDAGE GAUZE ELAST BULKY 4 IN (GAUZE/BANDAGES/DRESSINGS) ×2 IMPLANT
BNDG CMPR 9X6 STRL LF SNTH (GAUZE/BANDAGES/DRESSINGS)
BNDG COHESIVE 4X5 TAN STRL (GAUZE/BANDAGES/DRESSINGS) IMPLANT
BNDG ESMARK 6X9 LF (GAUZE/BANDAGES/DRESSINGS)
CLOTH BEACON ORANGE TIMEOUT ST (SAFETY) ×2 IMPLANT
COVER SURGICAL LIGHT HANDLE (MISCELLANEOUS) ×2 IMPLANT
CUFF TOURNIQUET SINGLE 34IN LL (TOURNIQUET CUFF) IMPLANT
CUFF TOURNIQUET SINGLE 44IN (TOURNIQUET CUFF) IMPLANT
DRAPE C-ARM 42X72 X-RAY (DRAPES) IMPLANT
DRAPE INCISE IOBAN 66X45 STRL (DRAPES) IMPLANT
DRAPE ORTHO SPLIT 77X108 STRL (DRAPES)
DRAPE SURG ORHT 6 SPLT 77X108 (DRAPES) IMPLANT
DRSG EMULSION OIL 3X3 NADH (GAUZE/BANDAGES/DRESSINGS) ×2 IMPLANT
DRSG PAD ABDOMINAL 8X10 ST (GAUZE/BANDAGES/DRESSINGS) ×2 IMPLANT
ELECT REM PT RETURN 9FT ADLT (ELECTROSURGICAL) ×2
ELECTRODE REM PT RTRN 9FT ADLT (ELECTROSURGICAL) ×1 IMPLANT
GLOVE BIOGEL PI IND STRL 9 (GLOVE) ×1 IMPLANT
GLOVE BIOGEL PI INDICATOR 9 (GLOVE) ×1
GLOVE SURG ORTHO 9.0 STRL STRW (GLOVE) ×2 IMPLANT
GOWN PREVENTION PLUS XLARGE (GOWN DISPOSABLE) ×2 IMPLANT
GOWN SRG XL XLNG 56XLVL 4 (GOWN DISPOSABLE) ×2 IMPLANT
GOWN STRL NON-REIN XL XLG LVL4 (GOWN DISPOSABLE) ×4
KIT BASIN OR (CUSTOM PROCEDURE TRAY) ×2 IMPLANT
KIT ROOM TURNOVER OR (KITS) ×2 IMPLANT
MANIFOLD NEPTUNE II (INSTRUMENTS) ×2 IMPLANT
NS IRRIG 1000ML POUR BTL (IV SOLUTION) ×2 IMPLANT
PACK ORTHO EXTREMITY (CUSTOM PROCEDURE TRAY) ×2 IMPLANT
PAD ARMBOARD 7.5X6 YLW CONV (MISCELLANEOUS) ×4 IMPLANT
PAD CAST 4YDX4 CTTN HI CHSV (CAST SUPPLIES) ×1 IMPLANT
PADDING CAST COTTON 4X4 STRL (CAST SUPPLIES) ×2
SPONGE GAUZE 4X4 12PLY (GAUZE/BANDAGES/DRESSINGS) ×2 IMPLANT
STAPLER VISISTAT 35W (STAPLE) IMPLANT
STOCKINETTE IMPERVIOUS 9X36 MD (GAUZE/BANDAGES/DRESSINGS) IMPLANT
SUT ETHILON 2 0 PSLX (SUTURE) IMPLANT
SUT VIC AB 0 CT1 27 (SUTURE)
SUT VIC AB 0 CT1 27XBRD ANBCTR (SUTURE) IMPLANT
SUT VIC AB 2-0 CT1 27 (SUTURE)
SUT VIC AB 2-0 CT1 TAPERPNT 27 (SUTURE) IMPLANT
TOWEL OR 17X24 6PK STRL BLUE (TOWEL DISPOSABLE) ×2 IMPLANT
TOWEL OR 17X26 10 PK STRL BLUE (TOWEL DISPOSABLE) ×2 IMPLANT
WATER STERILE IRR 1000ML POUR (IV SOLUTION) ×2 IMPLANT

## 2011-08-06 NOTE — Anesthesia Procedure Notes (Signed)
Procedure Name: LMA Insertion Date/Time: 08/06/2011 12:20 PM Performed by: Shirlyn Goltz, TOM Pre-anesthesia Checklist: Patient identified, Emergency Drugs available, Suction available, Patient being monitored and Timeout performed Patient Re-evaluated:Patient Re-evaluated prior to inductionOxygen Delivery Method: Circle system utilized Preoxygenation: Pre-oxygenation with 100% oxygen Intubation Type: IV induction Ventilation: Mask ventilation without difficulty LMA: LMA inserted LMA Size: 4.0 Tube type: Oral Number of attempts: 1 Placement Confirmation: positive ETCO2 and breath sounds checked- equal and bilateral Tube secured with: Tape Dental Injury: Teeth and Oropharynx as per pre-operative assessment

## 2011-08-06 NOTE — Progress Notes (Signed)
ANTICOAGULATION CONSULT NOTE - Initial Consult  Pharmacy Consult for warfarin Indication: DVT prophylaxis post surgery     Assessment: Patient is a 46 yo male who is status post open reduction internal fixation for right distal radius fracture. Patient complains of pain from the retained hardware and presents at this time for removal of deep retained hardware. INR is at baseline of 0.9 will start warfarin for dvt px. Warfarin score is 6.    Goal of Therapy:  INR 2 - 3 unless otherwise specified by MD   Plan:  Warfarin 7.5mg  today INR daily  No Known Allergies   Vital Signs: Temp: 98.3 F (36.8 C) (03/01 1600) Temp src: Oral (03/01 0948) BP: 143/94 mmHg (03/01 1600) Pulse Rate: 54  (03/01 1430)  Labs:  Basename 08/04/11 1440  HGB 14.1  HCT 39.3  PLT 147*  APTT 31  LABPROT 12.3  INR 0.90  HEPARINUNFRC --  CREATININE 0.98  CKTOTAL --  CKMB --  TROPONINI --   CrCl is unknown because there is no height on file for the current visit.  Medical History: Past Medical History  Diagnosis Date  . Glaucoma   . Wrist fracture, right   . Foot and toe(s), blister     bilat feet, blistering between toes  . Weight loss     25 lb wt loss since 08/2010  . Hypertension     hx of  . UTI (lower urinary tract infection) 08/04/11    finishing abx tomorrow  . Headache   . Anxiety   . Arthritis   . Blind in both eyes     sees lights and shadows    Medications:  Prescriptions prior to admission  Medication Sig Dispense Refill  . ciprofloxacin (CIPRO) 500 MG tablet Take 500 mg by mouth 2 (two) times daily. Take for 7 days.  First dose 08/02/2011.      Marland Kitchen HYDROcodone-acetaminophen (NORCO) 5-325 MG per tablet Take 2 tablets by mouth every 6 (six) hours as needed. For pain      . hydrocortisone cream 1 % Apply 1 application topically 2 (two) times daily as needed. For rash.        Severiano Gilbert 08/06/2011,5:32 PM

## 2011-08-06 NOTE — Transfer of Care (Signed)
Immediate Anesthesia Transfer of Care Note  Patient: Austin [name]  Procedure(s) Performed: Procedure(s) (LRB): HARDWARE REMOVAL (Right)  Patient Location: PACU  Anesthesia Type: General  Level of Consciousness: awake, alert , oriented and patient cooperative  Airway & Oxygen Therapy: Patient Spontanous Breathing and Patient connected to nasal cannula oxygen  Post-op Assessment: Report given to PACU RN, Post -op Vital signs reviewed and stable and Patient moving all extremities X 4  Post vital signs: Reviewed and stable  Complications: No apparent anesthesia complications

## 2011-08-06 NOTE — H&P (Signed)
Austin [name] is an 46 y.o. male.   Chief Complaint: Right wrist pain. HPI: Patient is a 20 she'll gentleman who is status post open reduction internal fixation for right distal radius fracture. Patient complains of pain from the retained hardware and presents at this time for removal of deep retained hardware.  Past Medical History  Diagnosis Date  . Glaucoma   . Wrist fracture, right   . Foot and toe(s), blister     bilat feet, blistering between toes  . Weight loss     25 lb wt loss since 08/2010  . Hypertension     hx of  . UTI (lower urinary tract infection) 08/04/11    finishing abx tomorrow  . Headache   . Anxiety   . Arthritis   . Blind in both eyes     sees lights and shadows    Past Surgical History  Procedure Date  . Eye surgery   . Fracture surgery   . Multiple tooth extractions     for dentures  . Foot srugery     removal of bone near small toe    No family history on file. Social History:  reports that he has been smoking Cigars.  He does not have any smokeless tobacco history on file. He reports that he drinks about 3.5 ounces of alcohol per week. He reports that he uses illicit drugs (Marijuana).  Allergies: No Known Allergies  Medications Prior to Admission  Medication Dose Route Frequency Provider Last Rate Last Dose  . ceFAZolin (ANCEF) IVPB 1 g/50 mL premix  1 g Intravenous 60 min Pre-Op Nadara Mustard, MD      . ondansetron [facility]) injection 4 mg  4 mg Intravenous Once Flint Melter, MD   4 mg at 07/26/11 1640  . ondansetron (ZOFRAN-ODT) disintegrating tablet 4 mg  4 mg Oral Once Luiz Blare, MD   4 mg at 07/26/11 1100  . sodium chloride 0.9 % bolus 1,000 mL  1,000 mL Intravenous Once Flint Melter, MD   1,000 mL at 07/26/11 1638  . DISCONTD: 0.9 %  sodium chloride infusion   Intravenous Continuous Flint Melter, MD 125 mL/hr at 07/26/11 1717     Medications Prior to Admission  Medication Sig Dispense Refill  . ciprofloxacin (CIPRO) 500  MG tablet Take 500 mg by mouth 2 (two) times daily. Take for 7 days.  First dose 08/02/2011.      Marland Kitchen HYDROcodone-acetaminophen (NORCO) 5-325 MG per tablet Take 2 tablets by mouth every 6 (six) hours as needed. For pain      . hydrocortisone cream 1 % Apply 1 application topically 2 (two) times daily as needed. For rash.        Results for orders placed during the hospital encounter of 08/04/11 (from the past 48 hour(s))  SURGICAL PCR SCREEN     Status: Normal   Collection Time   08/04/11  2:39 PM      Component Value Range Comment   MRSA, PCR NEGATIVE  NEGATIVE     Staphylococcus aureus NEGATIVE  NEGATIVE     No results found.  Review of Systems  All other systems reviewed and are negative.    Blood pressure 142/93, pulse 70, temperature 98.4 F (36.9 C), temperature source Oral, resp. rate 18, SpO2 100.00%. Physical Exam  Examination of right wrist patient has global tenderness to palpation there is no crepitation with range of motion. Radiographs shows a bursal angulation to the  distal radius. Assessment/Plan Assessment: Painful retained hardware right wrist.  Plan: Patient was to proceed with removal of deep retained hardware. Risks and benefits were discussed including infection neurovascular injury persistent pain potential need for osteotomy for correction of the dorsal tilt. Patient states he understands was pursued this time.  Denym Christenberry V 08/06/2011, 9:50 AM

## 2011-08-06 NOTE — Op Note (Signed)
OPERATIVE REPORT  DATE OF SURGERY: 08/06/2011  PATIENT:  Austin [name],  46 y.o. male  PRE-OPERATIVE DIAGNOSIS:  Painful Deep Retained Hardware Right Wrist  POST-OPERATIVE DIAGNOSIS:  painful deep retained hardware right wrist  PROCEDURE:  Procedure(s): HARDWARE REMOVAL  SURGEON:  Surgeon(s): Nadara Mustard, MD  ANESTHESIA:   general  EBL:  Minimal ML  SPECIMEN:  No Specimen  TOURNIQUET:  * No tourniquets in log *  PROCEDURE DETAILS: Patient is a 46 year old gentleman who is status post ORIF right distal radius fracture. Patient has been complaining of increasing pain from the retained hardware has failed conservative treatment and presents at this time for surgical intervention. Risks and benefits were discussed including infection neurovascular injury persistent pain need for additional surgery. Patient states he understands was pursued this time position of procedure patient brought to OR room 3 and underwent a general anesthetic. After adequate levels and anesthesia obtained patient's right upper extremity was prepped using DuraPrep draped in the sterile field. His previous extensile Sherilyn Cooter approach was used this was carried down to the [employer]. The FCR sheath was incised the FCR was retracted radially and the dissection was carried down to the plate. There was fibrous tissue around the plate which was resected. The plate was removed without complications. The wounds irrigated with normal saline. The Esmarch around the wrist was released after approximately 15 minutes and hemostasis was obtained. The wound was closed using 2-0 nylon. The wound was covered with Adaptic orthopedic sponges Kerlix and Coban. Patient was extubated taken to the PACU in stable condition plan for overnight observation for pain control.  PLAN OF CARE: Admit for overnight observation  PATIENT DISPOSITION:  PACU - hemodynamically stable.   Nadara Mustard, MD 08/06/2011 1:02 PM

## 2011-08-06 NOTE — Anesthesia Preprocedure Evaluation (Addendum)
Anesthesia Evaluation  Patient identified by MRN, date of birth, ID band Patient awake    Reviewed: Allergy & Precautions, H&P , NPO status , Patient's Chart, lab work & pertinent test results  Airway Mallampati: II TM Distance: >3 FB Neck ROM: full    Dental  (+) Edentulous Upper and Edentulous Lower   Pulmonary          Cardiovascular hypertension, regular Normal    Neuro/Psych  Headaches, Anxiety Blindness    GI/Hepatic   Endo/Other    Renal/GU      Musculoskeletal   Abdominal   Peds  Hematology   Anesthesia Other Findings Blind (Glaucoma)  Reproductive/Obstetrics                          Anesthesia Physical Anesthesia Plan  ASA: II  Anesthesia Plan:    Post-op Pain Management:    Induction: Intravenous  Airway Management Planned: Mask and LMA  Additional Equipment:   Intra-op Plan:   Post-operative Plan:   Informed Consent: I have reviewed the patients History and Physical, chart, labs and discussed the procedure including the risks, benefits and alternatives for the proposed anesthesia with the patient or authorized representative who has indicated his/her understanding and acceptance.     Plan Discussed with: CRNA, Anesthesiologist and Surgeon  Anesthesia Plan Comments:         Anesthesia Quick Evaluation

## 2011-08-06 NOTE — Anesthesia Postprocedure Evaluation (Signed)
  Anesthesia Post-op Note  Patient: Austin [name]  Procedure(s) Performed: Procedure(s) (LRB): HARDWARE REMOVAL (Right)  Patient Location: PACU  Anesthesia Type: General  Level of Consciousness: awake, alert , oriented and patient cooperative  Airway and Oxygen Therapy: Patient Spontanous Breathing and Patient connected to nasal cannula oxygen  Post-op Pain: mild  Post-op Assessment: Post-op Vital signs reviewed, Patient's Cardiovascular Status Stable, Respiratory Function Stable, Patent Airway, No signs of Nausea or vomiting and Pain level controlled  Post-op Vital Signs: stable  Complications: No apparent anesthesia complications

## 2011-08-06 NOTE — Preoperative (Signed)
Beta Blockers   Reason not to administer Beta Blockers:Not Applicable 

## 2011-08-07 DIAGNOSIS — Z969 Presence of functional implant, unspecified: Secondary | ICD-10-CM | POA: Diagnosis present

## 2011-08-07 LAB — PROTIME-INR
INR: 0.93 (ref 0.00–1.49)
Prothrombin Time: 12.7 seconds (ref 11.6–15.2)

## 2011-08-07 NOTE — Progress Notes (Addendum)
Subjective: 1 Day Post-Op Procedure(s) (LRB): HARDWARE REMOVAL (Right)  Patient reports pain as mild.    Objective:   VITALS:  Temp:  [97 F (36.1 C)-98.7 F (37.1 C)] 98.4 F (36.9 C) (03/02 0509) Pulse Rate:  [54-77] 56  (03/02 0509) Resp:  [10-20] 18  (03/02 0509) BP: (117-152)/(75-98) 152/78 mmHg (03/02 0509) SpO2:  [96 %-100 %] 100 % (03/02 0509) Weight:  [65.772 kg (145 lb)] 65.772 kg (145 lb) (03/02 0501)  Neurologically intact ABD soft Neurovascular intact Sensation intact distally Intact pulses distally Compartment soft Dressing and splint right forearm intact without drainage or saturation.    LABS  Basename 08/04/11 1440  HGB 14.1  WBC 4.9  PLT 147*    Basename 08/04/11 1440  NA 138  K 4.2  CL 103  CO2 26  BUN 11  CREATININE 0.98  GLUCOSE 114*    Basename 08/07/11 0600 08/04/11 1440  LABPT -- --  INR 0.93 0.90     Assessment/Plan: 1 Day Post-Op Procedure(s) (LRB): HARDWARE REMOVAL (Right)  Advance diet Discharge home with home health D/C warfarin Rx for percocet on chart.  Damani Rando E 08/07/2011, 1:09 PM

## 2011-08-07 NOTE — Discharge Summary (Signed)
Physician Discharge Summary  Patient ID: Austin [name] MRN: [id number] DOB/AGE: 12-Oct-1965 46 y.o.  Admit date: 08/06/2011 Discharge date:   Admission Diagnoses:  Principal Problem:  *Retained orthopedic hardware Deep right forearm.  Discharge Diagnoses:  Same  Past Medical History  Diagnosis Date  . Glaucoma   . Wrist fracture, right   . Foot and toe(s), blister     bilat feet, blistering between toes  . Weight loss     25 lb wt loss since 08/2010  . Hypertension     hx of  . UTI (lower urinary tract infection) 08/04/11    finishing abx tomorrow  . Headache   . Anxiety   . Arthritis   . Blind in both eyes     sees lights and shadows    Surgeries: Procedure(s): HARDWARE REMOVAL on 08/06/2011   Consultants:    Discharged Condition: Improved  Hospital Course: Austin [name] is an 46 y.o. male who was admitted 08/06/2011 with a chief complaint of No chief complaint on file. , and found to have a diagnosis of Retained orthopedic hardware.  They were brought to the operating room on 08/06/2011 and underwent the above named procedures.    They were given perioperative antibiotics:  Anti-infectives     Start     Dose/Rate Route Frequency Ordered Stop   08/06/11 1800   ceFAZolin (ANCEF) IVPB 1 g/50 mL premix        1 g 100 mL/hr over 30 Minutes Intravenous Every 6 hours 08/06/11 1653 08/07/11 0531   08/05/11 1415   ceFAZolin (ANCEF) IVPB 1 g/50 mL premix        1 g 100 mL/hr over 30 Minutes Intravenous 60 min pre-op 08/05/11 1411 08/06/11 1221        .  They were given sequential compression devices, early ambulation, and chemoprophylaxis for DVT prophylaxis.Coumadin initially started then discontinued at discharge. Not indicated for upper extremity procedure. POD#1 alert Oriented x 3. Right forearm dressing dry and Intact splint intact. Right hand warm, without swelling, fingers neurovascular intact. Normal sensation and capillary refill. Excellent active ROM of the right  hand fingers.  They benefited maximally from their hospital stay and there were no complications.    Recent vital signs:  Filed Vitals:   08/07/11 0509  BP: 152/78  Pulse: 56  Temp: 98.4 F (36.9 C)  Resp: 18    Recent laboratory studies:  Results for orders placed during the hospital encounter of 08/06/11  PROTIME-INR      Component Value Range   Prothrombin Time 12.7  11.6 - 15.2 (seconds)   INR 0.93  0.00 - 1.49     Discharge Medications:   Medication List  As of 08/07/2011  1:33 PM   STOP taking these medications         HYDROcodone-acetaminophen 5-325 MG per tablet         TAKE these medications         ciprofloxacin 500 MG tablet   Commonly known as: CIPRO   Take 500 mg by mouth 2 (two) times daily. Take for 7 days.  First dose 08/02/2011.      hydrocortisone cream 1 %   Apply 1 application topically 2 (two) times daily as needed. For rash.      oxyCODONE-acetaminophen 5-325 MG per tablet   Commonly known as: PERCOCET   Take 1 tablet by mouth every 4 (four) hours as needed for pain.  Diagnostic Studies: No results found.  Disposition: 01-Home or Self Care  Discharge Orders    Future Orders Please Complete By Expires   Diet - low sodium heart healthy      Call MD / Call 911      Comments:   If you experience chest pain or shortness of breath, CALL 911 and be transported to the hospital emergency room.  If you develope a fever above 101 F, pus (white drainage) or increased drainage or redness at the wound, or calf pain, call your surgeon's office.   Constipation Prevention      Comments:   Drink plenty of fluids.  Prune juice may be helpful.  You may use a stool softener, such as Colace (over the counter) 100 mg twice a day.  Use MiraLax (over the counter) for constipation as needed.   Increase activity slowly as tolerated      Weight Bearing as taught in Physical Therapy      Comments:   Use a walker or crutches as instructed.       Follow-up Information    Follow up with DUDA,MARCUS V, MD in 2 weeks.   Contact information:   [address] [place] [address] [phone]           Signed: Kerrin Champagne 08/07/2011, 1:33 PM

## 2011-08-07 NOTE — Progress Notes (Signed)
Verbal review of D/C instructions done and pt. Verbalizes understanding. Script given. D/C per w/c in stable condition.

## 2011-08-11 ENCOUNTER — Encounter (HOSPITAL_COMMUNITY): Payer: Self-pay | Admitting: Orthopedic Surgery

## 2012-02-11 ENCOUNTER — Emergency Department (INDEPENDENT_AMBULATORY_CARE_PROVIDER_SITE_OTHER): Payer: Medicare Other

## 2012-02-11 ENCOUNTER — Emergency Department (INDEPENDENT_AMBULATORY_CARE_PROVIDER_SITE_OTHER)
Admission: EM | Admit: 2012-02-11 | Discharge: 2012-02-11 | Disposition: A | Discharge status: Home / Self Care | Payer: Medicare Other | Source: Home / Self Care | Attending: Emergency Medicine | Admitting: Emergency Medicine

## 2012-02-11 ENCOUNTER — Encounter (HOSPITAL_COMMUNITY): Payer: Self-pay | Admitting: Emergency Medicine

## 2012-02-11 DIAGNOSIS — R55 Syncope and collapse: Secondary | ICD-10-CM

## 2012-02-11 DIAGNOSIS — S0101XA Laceration without foreign body of scalp, initial encounter: Secondary | ICD-10-CM

## 2012-02-11 DIAGNOSIS — J029 Acute pharyngitis, unspecified: Secondary | ICD-10-CM

## 2012-02-11 DIAGNOSIS — Z23 Encounter for immunization: Secondary | ICD-10-CM

## 2012-02-11 LAB — POCT I-STAT, CHEM 8
Chloride: 101 mEq/L (ref 96–112)
HCT: 44 % (ref 39.0–52.0)
Hemoglobin: 15 g/dL (ref 13.0–17.0)
Potassium: 4 mEq/L (ref 3.5–5.1)
Sodium: 138 mEq/L (ref 135–145)

## 2012-02-11 MED ORDER — TETANUS-DIPHTH-ACELL PERTUSSIS 5-2.5-18.5 LF-MCG/0.5 IM SUSP
INTRAMUSCULAR | Status: AC
  Filled 2012-02-11: qty 0.5

## 2012-02-11 MED ORDER — TRAMADOL HCL 50 MG PO TABS: 100.0000 mg | ORAL_TABLET | Freq: Three times a day (TID) | ORAL | Status: AC | PRN

## 2012-02-11 MED ORDER — TETANUS-DIPHTH-ACELL PERTUSSIS 5-2.5-18.5 LF-MCG/0.5 IM SUSP
0.5000 mL | Freq: Once | INTRAMUSCULAR | Status: AC
  Administered 2012-02-11: 0.5 mL via INTRAMUSCULAR

## 2012-02-11 NOTE — ED Notes (Signed)
Pt also reports he has had left ear pain and sore throat x 2 weeks.

## 2012-02-11 NOTE — ED Notes (Signed)
Pt got up from bed. Became dizzy and lost his balance. Pt is BLIND. Denies LOC. Has small lac on back of head where he hit his head on a table when he fell. Pt has hx of dizziness and falls in the past. Goes to [facility].

## 2012-02-11 NOTE — ED Provider Notes (Signed)
Chief Complaint  Patient presents with  . Near Syncope  . Otalgia  . Sore Throat    History of Present Illness:   Austin [name] is a 46 year old vision impaired male who had an episode of near-syncope about 11:30 this morning. He had been in bed for the past one to 2 days because of a sore throat. He stood up suddenly and had an episode of near-syncope. He doesn't think he lost consciousness completely. He did fall to the ground and hit the back of his head and has a laceration to his occipital area. He has a moderate headache and feels weak lower. He denies any focal weakness or neurological symptoms. 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 He states that for the past 2 weeks he's had a sore throat with pain in the left side it radiates towards his ear. He's felt dizzy, weak, and had pain on swallowing. He also notes sneezing, cough productive of some sputum, diarrhea, epigastric pain, and chest pain. He denies any fever or chills. He denies any palpitations or prior history of heart disease.  Review of Systems:  Other than noted above, the patient denies any of the following symptoms. Systemic:  No fever, chills, or fatigue. Pulmonary:  No cough, wheezing, shortness of breath. Cardiac:  No chest pain, tightness, pressure, palpitations, PND, orthopnea, or edema. Ext:  No leg pain or swelling. Neuro:  No weakness, paresthesias, or difficulty with speech or gait. No vertigo or difficujlty with coordination or balance. No seizure activity, tongue biting, or incontinence.  Psych:  No anxiety or depression.  PMFSH:  Past medical history, family history, social history, meds, and allergies were reviewed and updated as needed. No history of cardiac disease.  No history of excessive alcohol intake.  No family history of sudden death.  Physical Exam:   Vital signs:  BP 127/85  Pulse 75  Temp 98.4 F (36.9 C) (Oral)  Resp 16  SpO2 98% Filed Vitals:   02/11/12 1330 02/11/12 1355 02/11/12 1400 02/11/12 1401  BP:  143/82 139/73 Supine  132/88 Sitting  127/85 Standing   Pulse: 73 70 70 75  Temp: 98.4 F (36.9 C)     TempSrc: Oral     Resp: 16     SpO2: 98%       Gen:  Alert, oriented, in no distress, skin warm and dry. 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 ENT:  Mucous membranes moist, pharynx clear. Despite his symptoms of severe sore throat, his pharynx appears completely clear. He does have a tiny laceration in the right occipital area. Neck:  Supple, no adenopathy or tenderness.  No JVD.  Thyroid not enlarged. Lungs:  Clear to auscultation, no wheezes, rales or rhonchi.  No respiratory distress. Heart:  Regular rhythm, no extrasystoles.  No gallops, murmers, clicks or rubs. Abdomen:  Soft, nontender, no organomegaly or mass.  Bowel sounds normal.  No pulsatile abdominal mass or bruit. Ext:  No edema. Pulses full and equal. Neuro:  Neurological examination: The patient is alert and oriented x3. Speech is clear, fluent, and appropriate. Cranial nerves are intact. There is no pronator drift and finger to nose was normal. Muscle strength, sensation, and DTRs are normal. Babinskis are downgoing. Station and gait were normal. Romberg sign is negative, patient is able to perform tandem gait well. Skin:  Warm and dry.  No rash.  Labs:   Results for orders placed during the hospital  encounter of 02/11/12  POCT I-STAT, CHEM 8      Component Value Range   Sodium 138  135 - 145 mEq/L   Potassium 4.0  3.5 - 5.1 mEq/L   Chloride 101  96 - 112 mEq/L   BUN 9  6 - 23 mg/dL   Creatinine, Ser 8.11  0.50 - 1.35 mg/dL   Glucose, Bld 78  70 - 99 mg/dL   Calcium, Ion 9.14  7.82 - 1.23 mmol/L   TCO2 23  0 - 100 mmol/L   Hemoglobin 15.0  13.0 - 17.0 g/dL   HCT 95.6  21.3 - 08.6 %     EKG:   Date: 02/11/2012  Rate: 62  Rhythm: normal sinus rhythm  QRS Axis: normal  Intervals: normal  ST/T Wave abnormalities: normal  Conduction Disutrbances:none   Narrative Interpretation: Normal sinus rhythm, normal EKG.  Old EKG Reviewed: none available  Dg Chest 2 View  02/11/2012  *RADIOLOGY REPORT*  Clinical Data: Cough, chest pain, sputum production, dizziness, and near-syncope.  CHEST - 2 VIEW  Comparison: 08/25/2010  Findings: The heart, mediastinal, and hilar contours are normal. Normal pulmonary vascularity.  Trachea is midline. The lungs are well expanded and clear.  Negative for pleural effusion or pneumothorax.  Bilateral cervical ribs at the most inferior cervical spine vertebral body, assumed to be C7, are noted. Visualized thoracic spine vertebral bodies are normal in height and alignment.  No acute bony abnormality.  IMPRESSION:  1.  No acute cardiopulmonary disease. 2.  Bilateral cervical ribs.   Original Report Authenticated By: Britta Mccreedy, M.D.    Procedure Note:  Verbal informed consent was obtained from the patient.  Risks and benefits were outlined with the patient.  Patient understands and accepts these risks.  Identity of the patient was confirmed verbally and by armband.    Procedure was performed as followed:  The small laceration on the right posterior occipital area was prepped with Betadine and closed with a single staple. The patient tolerated this procedure well.  Patient tolerated the procedure well without any immediate complications.   Assessment:  The primary encounter diagnosis was Near syncope. Diagnoses of Scalp laceration and Sore throat were also pertinent to this visit.  His episode of near-syncope he may have been the result of poor oral intake and prolonged immobilization. I am not certain as to the cause of his sore throat since his throat appeared normal despite a complaint of severe sore throat, pain on swallowing, and poor oral intake.  Plan:   1.  The following meds were prescribed:   New Prescriptions   TRAMADOL (ULTRAM) 50 MG TABLET    Take 2 tablets (100 mg total) by mouth every 8 (eight) hours as needed  for pain.   2.  The patient was instructed in symptomatic care and handouts were given. 3.  The patient was told to return if becoming worse in any way, if no better in 3 or 4 days, and given some red flag symptoms including syncope, presyncope, dyspnea, or chest pain that would indicate earlier return.  Follow up:  The patient was told to follow up with Dr. Annalee Genta with regard to his sore throat. In the meantime he was encouraged to get plenty of Ensure and Gatorade.     Reuben Likes, MD 02/11/12 [phone]

## 2012-02-11 NOTE — ED Notes (Signed)
Patient called w questions regarding how to care for his scalp wound and continued pain and bleeding. Was advised to expect some oozing for next 24-48 hours, and would be okay to apply a band aide to his scalp and use an ice pack to help with the discomfort. Was also advised we cannot call something stronger for his injury, as that could mask signs of a worsening head injury condition, but he could take what ever he normally takes as a dose of tylenol for pain and if this does not help his pain then he would need to be rechecked

## 2012-02-18 ENCOUNTER — Emergency Department (INDEPENDENT_AMBULATORY_CARE_PROVIDER_SITE_OTHER)
Admission: EM | Admit: 2012-02-18 | Discharge: 2012-02-18 | Disposition: A | Discharge status: Home / Self Care | Payer: Medicare Other | Source: Home / Self Care | Attending: Emergency Medicine | Admitting: Emergency Medicine

## 2012-02-18 ENCOUNTER — Encounter (HOSPITAL_COMMUNITY): Payer: Self-pay

## 2012-02-18 DIAGNOSIS — Z4802 Encounter for removal of sutures: Secondary | ICD-10-CM

## 2012-02-18 NOTE — ED Notes (Signed)
Pt seen here 02/11/12 for repair of occipital laceration with one staple.  Here today for staple removal.  Denies concerns.

## 2012-02-18 NOTE — ED Provider Notes (Signed)
History     CSN: [id number]  Arrival date & time 02/18/12  5621   First MD Initiated Contact with Patient 02/18/12 1009      Chief Complaint  Patient presents with  . Suture / Staple Removal    (Consider location/radiation/quality/duration/timing/severity/associated sxs/prior treatment) HPI Comments: Patient presents to urgent care today to have a staple removed from his occipital region. Patient denies any tenderness, drainage or any other problem.  Patient is a 46 y.o. male presenting with suture removal. The history is provided by the patient.  Suture / Staple Removal  The sutures were placed 7 to 10 days ago. Treatments since wound repair include antibiotic ointment use. There has been no drainage from the wound. There is no redness present. There is no swelling present. The pain has no pain.    Past Medical History  Diagnosis Date  . Glaucoma   . Wrist fracture, right   . Foot and toe(s), blister     bilat feet, blistering between toes  . Weight loss     25 lb wt loss since 08/2010  . Hypertension     hx of  . UTI (lower urinary tract infection) 08/04/11    finishing abx tomorrow  . Headache   . Anxiety   . Arthritis   . Blind in both eyes     sees lights and shadows    Past Surgical History  Procedure Date  . Eye surgery   . Fracture surgery   . Multiple tooth extractions     for dentures  . Foot srugery     removal of bone near small toe  . Hardware removal 08/06/2011    Procedure: HARDWARE REMOVAL;  Surgeon: Nadara Mustard, MD;  Location: [facility] OR;  Service: Orthopedics;  Laterality: Right;  Removal Deep Retained Hardware Right Distal Radius    No family history on file.  History  Substance Use Topics  . Smoking status: Current Every Day Smoker    Types: Cigars  . Smokeless tobacco: Not on file   Comment: 1/2 - 1 cigar/day  . Alcohol Use: 3.5 oz/week    7 drink(s) per week      Review of Systems  Constitutional: Negative for fever and activity  change.  Respiratory: Negative for shortness of breath.   Skin: Negative for rash.  Neurological: Negative for dizziness and headaches.    Allergies  Review of patient's allergies indicates no known allergies.  Home Medications   Current Outpatient Rx  Name Route Sig Dispense Refill  . TRAMADOL HCL 50 MG PO TABS Oral Take 2 tablets (100 mg total) by mouth every 8 (eight) hours as needed for pain. 30 tablet 0  . CIPROFLOXACIN HCL 500 MG PO TABS Oral Take 500 mg by mouth 2 (two) times daily. Take for 7 days.  First dose 08/02/2011.    Marland Kitchen HYDROCORTISONE 1 % EX CREA Topical Apply 1 application topically 2 (two) times daily as needed. For rash.      BP 150/82  Pulse 69  Temp 98.3 F (36.8 C) (Oral)  Resp 16  SpO2 100%  Physical Exam  Nursing note and vitals reviewed. Constitutional: He appears well-developed and well-nourished.  HENT:  Head: Normocephalic.    Skin: No erythema.    ED Course  Procedures (including critical care time)  Labs Reviewed - No data to display No results found.   1. Visit for suture removal       MDM  Uncomplicated occipital suture removal  Jimmie Molly, MD 02/18/12 1224

## 2012-10-19 DIAGNOSIS — Z947 Corneal transplant status: Secondary | ICD-10-CM | POA: Insufficient documentation

## 2013-03-28 ENCOUNTER — Encounter: Payer: Self-pay | Admitting: Gastroenterology

## 2013-03-29 ENCOUNTER — Encounter: Payer: Self-pay | Admitting: Gastroenterology

## 2013-04-03 ENCOUNTER — Encounter: Payer: Self-pay | Admitting: Gastroenterology

## 2013-04-03 ENCOUNTER — Other Ambulatory Visit (INDEPENDENT_AMBULATORY_CARE_PROVIDER_SITE_OTHER): Payer: Medicare Other

## 2013-04-03 ENCOUNTER — Ambulatory Visit (INDEPENDENT_AMBULATORY_CARE_PROVIDER_SITE_OTHER): Payer: Medicare Other | Admitting: Gastroenterology

## 2013-04-03 VITALS — BP 116/86 | HR 70 | Ht 67.0 in | Wt 143.0 lb

## 2013-04-03 DIAGNOSIS — R634 Abnormal weight loss: Secondary | ICD-10-CM

## 2013-04-03 DIAGNOSIS — G8929 Other chronic pain: Secondary | ICD-10-CM

## 2013-04-03 DIAGNOSIS — R197 Diarrhea, unspecified: Secondary | ICD-10-CM

## 2013-04-03 DIAGNOSIS — R109 Unspecified abdominal pain: Secondary | ICD-10-CM

## 2013-04-03 DIAGNOSIS — R1013 Epigastric pain: Secondary | ICD-10-CM

## 2013-04-03 DIAGNOSIS — F1010 Alcohol abuse, uncomplicated: Secondary | ICD-10-CM

## 2013-04-03 LAB — CBC WITH DIFFERENTIAL/PLATELET
Basophils Absolute: 0 10*3/uL (ref 0.0–0.1)
Basophils Relative: 0.4 % (ref 0.0–3.0)
Eosinophils Absolute: 0.3 10*3/uL (ref 0.0–0.7)
Lymphocytes Relative: 44 % (ref 12.0–46.0)
MCHC: 33.7 g/dL (ref 30.0–36.0)
Neutrophils Relative %: 39 % — ABNORMAL LOW (ref 43.0–77.0)
RBC: 4.54 Mil/uL (ref 4.22–5.81)

## 2013-04-03 LAB — SEDIMENTATION RATE: Sed Rate: 7 mm/hr (ref 0–22)

## 2013-04-03 LAB — HEPATIC FUNCTION PANEL
AST: 68 U/L — ABNORMAL HIGH (ref 0–37)
Albumin: 4 g/dL (ref 3.5–5.2)

## 2013-04-03 LAB — BASIC METABOLIC PANEL
BUN: 9 mg/dL (ref 6–23)
Chloride: 101 mEq/L (ref 96–112)
Potassium: 4.1 mEq/L (ref 3.5–5.1)

## 2013-04-03 LAB — AMYLASE: Amylase: 162 U/L — ABNORMAL HIGH (ref 27–131)

## 2013-04-03 LAB — IBC PANEL
Saturation Ratios: 42.6 % (ref 20.0–50.0)
Transferrin: 251.4 mg/dL (ref 212.0–360.0)

## 2013-04-03 LAB — FERRITIN: Ferritin: 423.7 ng/mL — ABNORMAL HIGH (ref 22.0–322.0)

## 2013-04-03 LAB — TSH: TSH: 1.53 u[IU]/mL (ref 0.35–5.50)

## 2013-04-03 LAB — FOLATE: Folate: 7.2 ng/mL (ref >5.9)

## 2013-04-03 LAB — LIPASE: Lipase: 33 U/L (ref 11.0–59.0)

## 2013-04-03 LAB — VITAMIN B12: Vitamin B-12: 610 pg/mL (ref 211–911)

## 2013-04-03 MED ORDER — OMEPRAZOLE 40 MG PO CPDR: 40.0000 mg | DELAYED_RELEASE_CAPSULE | Freq: Every day | ORAL | Status: DC

## 2013-04-03 NOTE — Progress Notes (Signed)
History of Present Illness:  This is a 47 year old African American male who is blind in both eyes.  He apparently had colonoscopy 2 years ago which was unremarkable.  We do not have this report for review, and he is sent now by Dr. Mirna Mires for evaluation of anorexia and weight loss.  This patient relates that for the last 3 years he's had anorexia,and he may go 3-4 days without eating.  When he does he has pain in his epigastric area with nausea but no emesis.  He denies reflux symptoms or dysphagia or any specific hepatobiliary complaints.  His abdominal pain is not severe and does not awaken him at night.  As mentioned above his had a negative colonoscopy, and he says he has regular bowel movements, but also appears of rather severe bulky-diarrhea..  He denies hematemesis or melena, but he is legally blind.  He apparently uses alcohol daily and heavily but denies a past history of hepatitis or pancreatitis.  He also denies abuse of cigarettes or NSAIDs.  I have no other records for review.  He is accompanied today by his brother who fell asleep during the interview and exam.  I have reviewed this patient's present history, medical and surgical past history, allergies and medications.     ROS:   All systems were reviewed and are negative unless otherwise stated in the HPI.    Physical Exam: Blood pressure 116/86, pulse 70 and regular, weight 143 with a BMI of 22.39.  I am unable to tell if his jaundice because of his eye problems. General well developed well nourished patient in no acute distress, appearing their stated age He has one prosthesis in his left eye and his right eye is bandaged from recent surgery. Skin no lesions noted Neck supple, no adenopathy, no thyroid enlargement, no tenderness Chest clear to percussion and auscultation Heart no significant murmurs, gallops or rubs noted Abdomen no hepatosplenomegaly masses or tenderness, BS normal.  Rectal inspection normal no fissures, or  fistulae noted.  No masses or tenderness on digital exam. Stool guaiac negative. Extremities no acute joint lesions, edema, phlebitis or evidence of cellulitis. Neurologic patient oriented x 3, cranial nerves intact, no focal neurologic deficits noted. Psychological mental status normal and normal affect.  Assessment and plan: I cannot appreciate hepatosplenomegaly or any abdominal masses.  His anorexia and weight loss certainly suggests possibility of either chronic alcoholic pancreatitis or pancreatic cancer.  Other considerations would be gastric carcinoma or some type of structural/ inflammatory upper GI lesion.  I've sent him by the lab for a complete set of lab tests including amylase and lipase, and scheduled CT scan of the abdomen and pelvis.  He probably will need endoscopic exam with these been completed.  In the interim, I placed him on Dexilant 60 mg every morning.  This patient has some type of serious underlying medical days gastrointestinal illness with an alleged 40-50 pound weight loss over the last several years.  He does complain of periodic diarrhea, but this is not his main complaint.  There may be an element of associated chronic malabsorption.

## 2013-04-03 NOTE — Patient Instructions (Addendum)
You have been given a separate informational sheet regarding your tobacco use, the importance of quitting and local resources to help you quit.   You have been scheduled for a CT scan of the abdomen and pelvis at [facility] ([address]---this is in the same building as [facility]).   You are scheduled on 04-06-2013 at 230 pm. You should arrive 15 minutes prior to your appointment time for registration. Please follow the written instructions below on the day of your exam:  WARNING: IF YOU ARE ALLERGIC TO IODINE/X-RAY DYE, PLEASE NOTIFY RADIOLOGY IMMEDIATELY AT [phone]! YOU WILL BE GIVEN A 13 HOUR PREMEDICATION PREP.  1) Do not eat or drink anything after 1030 am (4 hours prior to your test) 2) You have been given 2 bottles of oral contrast to drink. The solution may taste better if refrigerated, but do NOT add ice or any other liquid to this solution. Shake  well before drinking.    Drink 1 bottle of contrast @ 1230 pm (2 hours prior to your exam)  Drink 1 bottle of contrast @ 130 pm (1 hour prior to your exam)  You may take any medications as prescribed with a small amount of water except for the following: Metformin, Glucophage, Glucovance, Avandamet, Riomet, Fortamet, Actoplus Met, Janumet, Glumetza or Metaglip. The above medications must be held the day of the exam AND 48 hours after the exam.  The purpose of you drinking the oral contrast is to aid in the visualization of your intestinal tract. The contrast solution may cause some diarrhea. Before your exam is started, you will be given a small amount of fluid to drink. Depending on your individual set of symptoms, you may also receive an intravenous injection of x-ray contrast/dye. Plan on being at [facility] for 30 minutes or long, depending on the type of exam you are having performed.  This test typically takes 30-45 minutes to complete.  If you have any questions regarding your exam or if you need  to reschedule, you may call the CT department at [phone] between the hours of 8:00 am and 5:00 pm, Monday-Friday.  _______________________________________________________________________________________________________________________________________  Dr. Loleta Chance does not have record for your Colonoscopy report, if you remember who did the procedure please call office so we may obtain that record.  We have sent the following medications to your pharmacy for you to pick up at your convenience: Omeprazole 40 mg, please take one tablet by mouth once daily   Your physician has requested that you go to the basement for the following lab work before leaving today: Amylase Lipase  Sedimentation Rate  Anemia Panel Liver Function Panel CBC TSH BMP

## 2013-04-04 ENCOUNTER — Encounter: Payer: Medicare Other | Admitting: Gastroenterology

## 2013-04-06 ENCOUNTER — Other Ambulatory Visit: Payer: Medicare Other

## 2013-04-06 ENCOUNTER — Telehealth: Payer: Self-pay | Admitting: Gastroenterology

## 2013-04-06 NOTE — Telephone Encounter (Signed)
lmom for pt to call back; informed Rose at [facility]

## 2013-04-10 ENCOUNTER — Ambulatory Visit (HOSPITAL_COMMUNITY)
Admission: RE | Admit: 2013-04-10 | Discharge: 2013-04-10 | Disposition: A | Discharge status: Home / Self Care | Payer: Medicare Other | Source: Ambulatory Visit | Attending: Gastroenterology | Admitting: Gastroenterology

## 2013-04-10 DIAGNOSIS — R109 Unspecified abdominal pain: Secondary | ICD-10-CM

## 2013-04-10 MED ORDER — IOHEXOL 300 MG/ML  SOLN
100.0000 mL | Freq: Once | INTRAMUSCULAR | Status: AC | PRN
  Administered 2013-04-10: 100 mL via INTRAVENOUS

## 2013-04-11 ENCOUNTER — Telehealth: Payer: Self-pay | Admitting: *Deleted

## 2013-04-11 DIAGNOSIS — K760 Fatty (change of) liver, not elsewhere classified: Secondary | ICD-10-CM

## 2013-04-11 NOTE — Telephone Encounter (Signed)
Pt r/s

## 2013-04-11 NOTE — Telephone Encounter (Signed)
Informed pt of the need for EGD and Hemochromatosis lab. He will come for PV and lab on 04/16/13 and his EGD is on 04/18/13. Also discussed pt's CT and the need for him to cut back on his drinking. He admits to 1-2 beers a day and he drinks liquor on the weekends. He will attempt to cut back.

## 2013-04-11 NOTE — Telephone Encounter (Signed)
Message copied by Florene Glen on Wed Apr 11, 2013  2:33 PM ------      Message from: Jarold Motto, DAVID R      Created: Tue Apr 10, 2013  4:17 PM       CT scan labs are unremarkable. He needs to be scheduled for endoscopic exam. ------

## 2013-04-16 ENCOUNTER — Ambulatory Visit (AMBULATORY_SURGERY_CENTER): Payer: Self-pay | Admitting: *Deleted

## 2013-04-16 ENCOUNTER — Other Ambulatory Visit: Payer: Medicare Other

## 2013-04-16 ENCOUNTER — Encounter: Payer: Medicare Other | Admitting: Gastroenterology

## 2013-04-16 ENCOUNTER — Other Ambulatory Visit: Payer: Self-pay | Admitting: Gastroenterology

## 2013-04-16 VITALS — Ht 67.0 in | Wt 150.6 lb

## 2013-04-16 DIAGNOSIS — R109 Unspecified abdominal pain: Secondary | ICD-10-CM

## 2013-04-16 NOTE — Progress Notes (Signed)
No allergies to eggs or soy. No problems with anesthesia.  

## 2013-04-18 ENCOUNTER — Ambulatory Visit (AMBULATORY_SURGERY_CENTER): Payer: Medicare Other | Admitting: Gastroenterology

## 2013-04-18 ENCOUNTER — Telehealth: Payer: Self-pay | Admitting: *Deleted

## 2013-04-18 ENCOUNTER — Encounter: Payer: Self-pay | Admitting: Gastroenterology

## 2013-04-18 VITALS — BP 132/90 | HR 69 | Temp 98.6°F | Resp 17 | Ht 67.0 in | Wt 150.0 lb

## 2013-04-18 DIAGNOSIS — R109 Unspecified abdominal pain: Secondary | ICD-10-CM

## 2013-04-18 DIAGNOSIS — K2970 Gastritis, unspecified, without bleeding: Secondary | ICD-10-CM

## 2013-04-18 DIAGNOSIS — R112 Nausea with vomiting, unspecified: Secondary | ICD-10-CM

## 2013-04-18 DIAGNOSIS — R634 Abnormal weight loss: Secondary | ICD-10-CM

## 2013-04-18 MED ORDER — SODIUM CHLORIDE 0.9 % IV SOLN: 500.0000 mL | INTRAVENOUS | Status: DC

## 2013-04-18 NOTE — Progress Notes (Signed)
Patient did not experience any of the following events: a burn prior to discharge; a fall within the facility; wrong site/side/patient/procedure/implant event; or a hospital transfer or hospital admission upon discharge from the facility. (G8907) Patient did not have preoperative order for IV antibiotic SSI prophylaxis. (G8918)  

## 2013-04-18 NOTE — Progress Notes (Signed)
Lidocaine-40mg IV prior to Propofol InductionPropofol given over incremental dosages 

## 2013-04-18 NOTE — Op Note (Signed)
[facility] [address] [place], [address]   ENDOSCOPY PROCEDURE REPORT  PATIENT: [name], Austin Robles  MR#: [id number] BIRTHDATE: Apr 01, 1966 , 47  yrs. old GENDER: Male ENDOSCOPIST:Evanee Lubrano Hale Bogus, MD, Clementeen Graham REFERRED BY: Mirna Mires, M.D. PROCEDURE DATE:  04/18/2013 PROCEDURE:   EGD w/ biopsy for H.pylori ASA CLASS:    Class II INDICATIONS: Nausea and Epigastric pain. MEDICATION: propofol (Diprivan) 250mg  IV TOPICAL ANESTHETIC:   Cetacaine Spray  DESCRIPTION OF PROCEDURE:   After the risks and benefits of the procedure were explained, informed consent was obtained.  The LB [id number]  endoscope was introduced through the mouth  and advanced to the second portion of the duodenum .  The instrument was slowly withdrawn as the mucosa was fully examined.      DUODENUM: The duodenal mucosa showed no abnormalities in the bulb and second portion of the duodenum.  STOMACH: There was mild antral gastropathy noted.  Cold forcep biopsies were taken at the antrum.  ESOPHAGUS: The mucosa of the esophagus appeared normal. Retroflexed views revealed no abnormalities.    The scope was then withdrawn from the patient and the procedure completed.  COMPLICATIONS: There were no complications.   ENDOSCOPIC IMPRESSION: 1.   The duodenal mucosa showed no abnormalities in the bulb and second portion of the duodenum 2.   There was mild antral gastropathy noted [T2] ...r/o H.pylori 3.   The mucosa of the esophagus appeared normal 4 . Abdomonal pain ?? etiology... CT scan normal.  RECOMMENDATIONS: 1.  Await pathology results 2.  Continue current medications 3. schedule gastric emptying scan.Marland KitchenMarland Kitchen?? functional problems,may need psych evaluation.    _______________________________ eSignedMardella Layman, MD, [facility] 04/18/2013 1:44 PM   standard discharge

## 2013-04-18 NOTE — Progress Notes (Signed)
Called to room to assist during endoscopic procedure.  Patient ID and intended procedure confirmed with present staff. Received instructions for my participation in the procedure from the performing physician.  

## 2013-04-18 NOTE — Patient Instructions (Addendum)
YOU HAD AN ENDOSCOPIC PROCEDURE TODAY AT THE [facility]: Refer to the procedure report that was given to you for any specific questions about what was found during the examination.  If the procedure report does not answer your questions, please call your gastroenterologist to clarify.  If you requested that your care partner not be given the details of your procedure findings, then the procedure report has been included in a sealed envelope for you to review at your convenience later.  YOU SHOULD EXPECT: Some feelings of bloating in the abdomen. Passage of more gas than usual.  Walking can help get rid of the air that was put into your GI tract during the procedure and reduce the bloating. If you had a lower endoscopy (such as a colonoscopy or flexible sigmoidoscopy) you may notice spotting of blood in your stool or on the toilet paper. If you underwent a bowel prep for your procedure, then you may not have a normal bowel movement for a few days.  DIET: Your first meal following the procedure should be a light meal and then it is ok to progress to your normal diet.  A half-sandwich or bowl of soup is an example of a good first meal.  Heavy or fried foods are harder to digest and may make you feel nauseous or bloated.  Likewise meals heavy in dairy and vegetables can cause extra gas to form and this can also increase the bloating.  Drink plenty of fluids but you should avoid alcoholic beverages for 24 hours.  ACTIVITY: Your care partner should take you home directly after the procedure.  You should plan to take it easy, moving slowly for the rest of the day.  You can resume normal activity the day after the procedure however you should NOT DRIVE or use heavy machinery for 24 hours (because of the sedation medicines used during the test).    SYMPTOMS TO REPORT IMMEDIATELY: A gastroenterologist can be reached at any hour.  During normal business hours, 8:30 AM to 5:00 PM Monday through Friday,  call [phone].  After hours and on weekends, please call the GI answering service at [phone] who will take a message and have the physician on call contact you.    Following upper endoscopy (EGD)  Vomiting of blood or coffee ground material  New chest pain or pain under the shoulder blades  Painful or persistently difficult swallowing  New shortness of breath  Fever of 100F or higher  Black, tarry-looking stools  FOLLOW UP: If any biopsies were taken you will be contacted by phone or by letter within the next 1-3 weeks.  Call your gastroenterologist if you have not heard about the biopsies in 3 weeks.  Our staff will call the home number listed on your records the next business day following your procedure to check on you and address any questions or concerns that you may have at that time regarding the information given to you following your procedure. This is a courtesy call and so if there is no answer at the home number and we have not heard from you through the emergency physician on call, we will assume that you have returned to your regular daily activities without incident.  SIGNATURES/CONFIDENTIALITY: You and/or your care partner have signed paperwork which will be entered into your electronic medical record.  These signatures attest to the fact that that the information above on your After Visit Summary has been reviewed and is understood.  Full   responsibility of the confidentiality of this discharge information lies with you and/or your care-partner.  Resume medications. Arrive at [facility] at 7:15 a.m. November 28th Radiology, Nothing after midnight. DO Not take  OMEPRAZOLE 40 MG November 28TH

## 2013-04-18 NOTE — Telephone Encounter (Signed)
Per Dr Jarold Motto, schedule for [facility]. 05/04/13 arrive at 07:15am at [facility], Npo after midnight, no prilosec. Shelia in [facility] will notify the pt.

## 2013-04-19 ENCOUNTER — Telehealth: Payer: Self-pay

## 2013-04-19 NOTE — Telephone Encounter (Signed)
  Follow up Call-  Call back number 04/18/2013  Post procedure Call Back phone  # [phone]  Permission to leave phone message Yes     Patient questions:  Do you have a fever, pain , or abdominal swelling? no Pain Score  0 *  Have you tolerated food without any problems? yes  Have you been able to return to your normal activities? yes  Do you have any questions about your discharge instructions: Diet   no Medications  no Follow up visit  no  Do you have questions or concerns about your Care? no  Actions: * If pain score is 4 or above: No action needed, pain <4.  No problems per the pt. Maw

## 2013-04-20 ENCOUNTER — Encounter: Payer: Self-pay | Admitting: Gastroenterology

## 2013-04-20 LAB — HELICOBACTER PYLORI SCREEN-BIOPSY: UREASE: NEGATIVE

## 2013-05-04 ENCOUNTER — Ambulatory Visit (HOSPITAL_COMMUNITY): Payer: Medicare Other

## 2013-05-15 ENCOUNTER — Encounter (HOSPITAL_COMMUNITY): Admission: RE | Admit: 2013-05-15 | Payer: Medicare Other | Source: Ambulatory Visit

## 2014-06-06 ENCOUNTER — Encounter (HOSPITAL_COMMUNITY): Payer: Self-pay | Admitting: Emergency Medicine

## 2014-06-06 ENCOUNTER — Emergency Department (INDEPENDENT_AMBULATORY_CARE_PROVIDER_SITE_OTHER)
Admission: EM | Admit: 2014-06-06 | Discharge: 2014-06-06 | Disposition: A | Discharge status: Home / Self Care | Payer: Medicare Other | Source: Home / Self Care | Attending: Emergency Medicine | Admitting: Emergency Medicine

## 2014-06-06 DIAGNOSIS — M79672 Pain in left foot: Secondary | ICD-10-CM | POA: Diagnosis not present

## 2014-06-06 DIAGNOSIS — L089 Local infection of the skin and subcutaneous tissue, unspecified: Secondary | ICD-10-CM | POA: Diagnosis not present

## 2014-06-06 MED ORDER — CEPHALEXIN 500 MG PO CAPS: 500.0000 mg | ORAL_CAPSULE | Freq: Four times a day (QID) | ORAL | Status: DC

## 2014-06-06 MED ORDER — MUPIROCIN CALCIUM 2 % NA OINT
TOPICAL_OINTMENT | NASAL | Status: DC
Start: 2014-06-06 — End: 2015-10-10

## 2014-06-06 MED ORDER — BACITRACIN 500 UNIT/GM EX OINT
1.0000 "application " | TOPICAL_OINTMENT | Freq: Once | CUTANEOUS | Status: AC
  Administered 2014-06-06: 1 via TOPICAL

## 2014-06-06 MED ORDER — HYDROCODONE-ACETAMINOPHEN 5-325 MG PO TABS
1.0000 | ORAL_TABLET | ORAL | Status: DC | PRN
Start: 2014-06-06 — End: 2015-10-10

## 2014-06-06 NOTE — Discharge Instructions (Signed)
Keep toes spaces dry. Place cotton between toes and change every 2 hours. Use a very small amount of the ointment twice a day. See your podiatrist next week.

## 2014-06-06 NOTE — ED Notes (Signed)
Noticed a wound or cut btween two toes about 3-4 days ago, now reports noticing odor to wound, wound is draining.  Patient has been soaking in epsom salts, applying topical antibiotic

## 2014-06-06 NOTE — ED Provider Notes (Signed)
CSN: [id number]     Arrival date & time 06/06/14  1350 History   First MD Initiated Contact with Patient 06/06/14 1444     Chief Complaint  Patient presents with  . Foot Pain   (Consider location/radiation/quality/duration/timing/severity/associated sxs/prior Treatment) HPI Comments: 48 year old male complaining of pain to the left foot. Approximately 10 days ago he noticed an area between the fifth and fourth toes of the left foot that were macerated and painful. In the past 3 days the localized pain has been getting worse. He is complaining of pain mainly to the lateral and dorsal SPECT of the foot. He denies any known injury. The left fifth toe is medially displaced place pressure on the adjacent toe. This causes skin to skin contact on a continuous basis bus skin breakdown and maceration appearance. The patient believes it to be infected and smells and odor that he believes his infection. He has been soaking the foot and saw and vinegar tainted water. This is a recurring problem.   Past Medical History  Diagnosis Date  . Glaucoma   . Wrist fracture, right   . Foot and toe(s), blister     bilat feet, blistering between toes  . Weight loss     25 lb wt loss since 08/2010  . Hypertension     hx of  . UTI (lower urinary tract infection) 08/04/11    finishing abx tomorrow  . Headache(784.0)   . Anxiety   . Arthritis   . Blind in both eyes     sees lights and shadows   Past Surgical History  Procedure Laterality Date  . Eye surgery Left   . Fracture surgery Right     wrist  . Multiple tooth extractions      for dentures  . Foot srugery      removal of bone near small toe  . Hardware removal  08/06/2011    Procedure: HARDWARE REMOVAL;  Surgeon: Newt Minion, MD;  Location: [facility];  Service: Orthopedics;  Laterality: Right;  Removal Deep Retained Hardware Right Distal Radius  . Eye surgery Right     prostetic eye   Family History  Problem Relation Age of Onset  . Diabetes Sister    . Diabetes Maternal Aunt     aunt and uncles  . Hypertension Sister     aunt and uncles maternal side  . Colon cancer Neg Hx    History  Substance Use Topics  . Smoking status: Current Every Day Smoker -- 1.00 packs/day    Types: Cigarettes, Cigars  . Smokeless tobacco: Never Used     Comment: 1/2 - 1 cigar/day  . Alcohol Use: 3.5 oz/week    7 drink(s) per week     Comment: mostly on the weekends occasionally through the week    Review of Systems  Constitutional: Negative.   Eyes: Positive for visual disturbance.  Respiratory: Negative.   Cardiovascular: Negative for chest pain.  Skin: Positive for wound.       As per HPI    Allergies  Review of patient's allergies indicates no known allergies.  Home Medications   Prior to Admission medications   Medication Sig Start Date End Date Taking? Authorizing Provider  brimonidine (ALPHAGAN) 0.2 % ophthalmic solution Place 1 drop into the left eye 2 (two) times daily.    Historical Provider, MD  cephALEXin (KEFLEX) 500 MG capsule Take 1 capsule (500 mg total) by mouth 4 (four) times daily. 06/06/14   Janne Napoleon,  NP  HYDROcodone-acetaminophen (NORCO/VICODIN) 5-325 MG per tablet Take 1 tablet by mouth every 4 (four) hours as needed. 06/06/14   Janne Napoleon, NP  mupirocin nasal ointment (BACTROBAN) 2 % Apply very small amount between toes bid 06/06/14   Janne Napoleon, NP  prednisoLONE acetate (PRED FORTE) 1 % ophthalmic suspension Place 1 drop into the left eye 2 (two) times daily.    Historical Provider, MD  timolol (BETIMOL) 0.5 % ophthalmic solution Place 1 drop into the left eye 2 (two) times daily.    Historical Provider, MD   BP 149/92 mmHg  Pulse 78  Temp(Src) 98.3 F (36.8 C) (Oral)  Resp 20  SpO2 98% Physical Exam  Constitutional: He is oriented to person, place, and time. He appears well-developed. No distress.  Eyes:  Blind  Cardiovascular: Normal rate.   Pulmonary/Chest: Effort normal. No respiratory distress.   Neurological: He is alert and oriented to person, place, and time.  Skin:  Skin between the 4th and 5th toes of left foot macerated, white. No current drainage, bleeding or exudate. The 5th toe is swollen and tender with minimal redness. Tenderness to the 4th toe. Pain and tenderness to the dorsilateral aspect of the forefoot. No deformity or swelling.  No redness or other discoloration.  5th digit digit anatomically deviated medially and rotated outward. No lymphangitis.  Nursing note and vitals reviewed.   ED Course  Procedures (including critical care time) Labs Review Labs Reviewed - No data to display  Imaging Review No results found.   MDM   1. Foot pain, left   2. Toe infection     Will place a very small amount of bacitracin ointment between the toes and then line with iodoform calls and cotton for separation. Patient instructed to keep his toes dry he may continue to use his solution for soaking bit make sure it stays dry Keflex as directed. He appears in ointment as directed, Norco 5 mg #15. Follow-up with podiatrist next week. \     Janne Napoleon, NP 06/06/14 [phone]

## 2014-08-15 ENCOUNTER — Emergency Department (HOSPITAL_COMMUNITY): Payer: Medicare Other

## 2014-08-15 ENCOUNTER — Emergency Department (HOSPITAL_COMMUNITY)
Admission: EM | Admit: 2014-08-15 | Discharge: 2014-08-15 | Disposition: A | Discharge status: Home / Self Care | Payer: Medicare Other | Attending: Emergency Medicine | Admitting: Emergency Medicine

## 2014-08-15 ENCOUNTER — Encounter (HOSPITAL_COMMUNITY): Payer: Self-pay | Admitting: Physical Medicine and Rehabilitation

## 2014-08-15 DIAGNOSIS — I10 Essential (primary) hypertension: Secondary | ICD-10-CM | POA: Insufficient documentation

## 2014-08-15 DIAGNOSIS — Z792 Long term (current) use of antibiotics: Secondary | ICD-10-CM | POA: Insufficient documentation

## 2014-08-15 DIAGNOSIS — M1990 Unspecified osteoarthritis, unspecified site: Secondary | ICD-10-CM | POA: Diagnosis not present

## 2014-08-15 DIAGNOSIS — Z79899 Other long term (current) drug therapy: Secondary | ICD-10-CM | POA: Insufficient documentation

## 2014-08-15 DIAGNOSIS — R0602 Shortness of breath: Secondary | ICD-10-CM | POA: Insufficient documentation

## 2014-08-15 DIAGNOSIS — Z87440 Personal history of urinary (tract) infections: Secondary | ICD-10-CM | POA: Insufficient documentation

## 2014-08-15 DIAGNOSIS — R079 Chest pain, unspecified: Secondary | ICD-10-CM | POA: Insufficient documentation

## 2014-08-15 DIAGNOSIS — Z720 Tobacco use: Secondary | ICD-10-CM | POA: Insufficient documentation

## 2014-08-15 DIAGNOSIS — Z87828 Personal history of other (healed) physical injury and trauma: Secondary | ICD-10-CM | POA: Diagnosis not present

## 2014-08-15 DIAGNOSIS — Z8781 Personal history of (healed) traumatic fracture: Secondary | ICD-10-CM | POA: Insufficient documentation

## 2014-08-15 DIAGNOSIS — H540 Blindness, both eyes: Secondary | ICD-10-CM | POA: Diagnosis not present

## 2014-08-15 DIAGNOSIS — R51 Headache: Secondary | ICD-10-CM | POA: Diagnosis not present

## 2014-08-15 DIAGNOSIS — D696 Thrombocytopenia, unspecified: Secondary | ICD-10-CM | POA: Insufficient documentation

## 2014-08-15 DIAGNOSIS — R519 Headache, unspecified: Secondary | ICD-10-CM

## 2014-08-15 DIAGNOSIS — R040 Epistaxis: Secondary | ICD-10-CM | POA: Insufficient documentation

## 2014-08-15 DIAGNOSIS — R748 Abnormal levels of other serum enzymes: Secondary | ICD-10-CM

## 2014-08-15 DIAGNOSIS — Z8659 Personal history of other mental and behavioral disorders: Secondary | ICD-10-CM | POA: Diagnosis not present

## 2014-08-15 LAB — COMPREHENSIVE METABOLIC PANEL
ALT: 152 U/L — ABNORMAL HIGH (ref 0–53)
ANION GAP: 11 (ref 5–15)
AST: 328 U/L — AB (ref 0–37)
Albumin: 3.5 g/dL (ref 3.5–5.2)
Alkaline Phosphatase: 140 U/L — ABNORMAL HIGH (ref 39–117)
BUN: <5 mg/dL — ABNORMAL LOW (ref 6–23)
CO2: 26 mmol/L (ref 19–32)
Calcium: 9.1 mg/dL (ref 8.4–10.5)
Chloride: 97 mmol/L (ref 96–112)
Creatinine, Ser: 1.11 mg/dL (ref 0.50–1.35)
GFR calc Af Amer: 88 mL/min — ABNORMAL LOW (ref >90)
GFR, EST NON AFRICAN AMERICAN: 76 mL/min — AB (ref >90)
GLUCOSE: 136 mg/dL — AB (ref 70–99)
Potassium: 4.1 mmol/L (ref 3.5–5.1)
Sodium: 134 mmol/L — ABNORMAL LOW (ref 135–145)
TOTAL PROTEIN: 6.4 g/dL (ref 6.0–8.3)
Total Bilirubin: 1 mg/dL (ref 0.3–1.2)

## 2014-08-15 LAB — CBC WITH DIFFERENTIAL/PLATELET
Basophils Absolute: 0 10*3/uL (ref 0.0–0.1)
Basophils Relative: 1 % (ref 0–1)
Eosinophils Absolute: 0.1 10*3/uL (ref 0.0–0.7)
Eosinophils Relative: 4 % (ref 0–5)
HCT: 40 % (ref 39.0–52.0)
Hemoglobin: 14.2 g/dL (ref 13.0–17.0)
Lymphocytes Relative: 47 % — ABNORMAL HIGH (ref 12–46)
Lymphs Abs: 1.2 10*3/uL (ref 0.7–4.0)
MCH: 32.8 pg (ref 26.0–34.0)
MCHC: 35.5 g/dL (ref 30.0–36.0)
MCV: 92.4 fL (ref 78.0–100.0)
Monocytes Absolute: 0.2 10*3/uL (ref 0.1–1.0)
Monocytes Relative: 9 % (ref 3–12)
Neutro Abs: 1 10*3/uL — ABNORMAL LOW (ref 1.7–7.7)
Neutrophils Relative %: 39 % — ABNORMAL LOW (ref 43–77)
Platelets: 91 10*3/uL — ABNORMAL LOW (ref 150–400)
RBC: 4.33 MIL/uL (ref 4.22–5.81)
RDW: 13.5 % (ref 11.5–15.5)
WBC: 2.5 10*3/uL — ABNORMAL LOW (ref 4.0–10.5)

## 2014-08-15 LAB — I-STAT TROPONIN, ED: TROPONIN I, POC: 0 ng/mL (ref 0.00–0.08)

## 2014-08-15 MED ORDER — KETOROLAC TROMETHAMINE 30 MG/ML IJ SOLN
30.0000 mg | Freq: Once | INTRAMUSCULAR | Status: AC
  Administered 2014-08-15: 30 mg via INTRAMUSCULAR

## 2014-08-15 MED ORDER — METOCLOPRAMIDE HCL 5 MG/ML IJ SOLN
10.0000 mg | Freq: Once | INTRAMUSCULAR | Status: AC
  Administered 2014-08-15: 10 mg via INTRAVENOUS
  Filled 2014-08-15: qty 2

## 2014-08-15 MED ORDER — KETOROLAC TROMETHAMINE 30 MG/ML IJ SOLN
30.0000 mg | Freq: Once | INTRAMUSCULAR | Status: DC
  Filled 2014-08-15: qty 1

## 2014-08-15 MED ORDER — SODIUM CHLORIDE 0.9 % IV BOLUS (SEPSIS)
1000.0000 mL | Freq: Once | INTRAVENOUS | Status: AC
  Administered 2014-08-15: 1000 mL via INTRAVENOUS

## 2014-08-15 MED ORDER — DIPHENHYDRAMINE HCL 50 MG/ML IJ SOLN
25.0000 mg | Freq: Once | INTRAMUSCULAR | Status: AC
  Administered 2014-08-15: 25 mg via INTRAVENOUS
  Filled 2014-08-15: qty 1

## 2014-08-15 NOTE — ED Provider Notes (Signed)
CSN: [id number]     Arrival date & time 08/15/14  1243 History   First MD Initiated Contact with Patient 08/15/14 1455     Chief Complaint  Patient presents with  . Headache  . Shortness of Breath  . Chest Pain     Patient is a 49 y.o. male presenting with headaches, shortness of breath, and chest pain. The history is provided by the patient. No language interpreter was used.  Headache Shortness of Breath Associated symptoms: chest pain and headaches   Chest Pain Associated symptoms: headache and shortness of breath    Mr. Betters presents for evaluation of headache and shortness of breath.  The headache started three days ago and was gradual in onset.  He tried advil without relief.  He describes it as a migraine headache.  The pain started behind his left eye and spread throughout his entire head.  He has a hx/o glaucoma and is legally blind - can see lights and shadows.  No recent head injury, no fevers, numbness, weakness.  He reports feeling fatigued and tired.  He sees Dr. Drema Dallas for his eyes at [facility].  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  In terms of shortness of breath has been waxing and waning.  It started about three days ago.  There is no clear alleviating or worsening factor.  He denies any leg swelling.  He reports three days of waxing and waning chest pain that is central.  The pain is described as mild, gas-like pain.  There are no clear alleviating or worsening factors, no change with activity.  He reports vomiting and diarrhea that is an ongoing issue.  He reports decreased appetite and nausea.  Sxs are moderate, waxing and waning, worsening.    Past Medical History  Diagnosis Date  . Glaucoma   . Wrist fracture, right   . Foot and toe(s), blister     bilat feet, blistering between toes  . Weight loss     25 lb wt loss since 08/2010  . Hypertension     hx of  . UTI (lower urinary tract infection) 08/04/11    finishing abx tomorrow  .  Headache(784.0)   . Anxiety   . Arthritis   . Blind in both eyes     sees lights and shadows   Past Surgical History  Procedure Laterality Date  . Eye surgery Left   . Fracture surgery Right     wrist  . Multiple tooth extractions      for dentures  . Foot srugery      removal of bone near small toe  . Hardware removal  08/06/2011    Procedure: HARDWARE REMOVAL;  Surgeon: Newt Minion, MD;  Location: [place];  Service: Orthopedics;  Laterality: Right;  Removal Deep Retained Hardware Right Distal Radius  . Eye surgery Right     prostetic eye   Family History  Problem Relation Age of Onset  . Diabetes Sister   . Diabetes Maternal Aunt     aunt and uncles  . Hypertension Sister     aunt and uncles maternal side  . Colon cancer Neg Hx    History  Substance Use Topics  . Smoking status: Current Every Day Smoker -- 1.00 packs/day    Types: Cigarettes, Cigars  . Smokeless tobacco: Never Used     Comment: 1/2 - 1 cigar/day  . Alcohol Use: 3.5 oz/week    7  Standard drinks or equivalent per week    Review of Systems  HENT: Positive for nosebleeds.   Respiratory: Positive for shortness of breath.   Cardiovascular: Positive for chest pain.  Neurological: Positive for headaches.      Allergies  Review of patient's allergies indicates no known allergies.  Home Medications   Prior to Admission medications   Medication Sig Start Date End Date Taking? Authorizing Provider  brimonidine (ALPHAGAN) 0.2 % ophthalmic solution Place 1 drop into the left eye 2 (two) times daily.   Yes Historical Provider, MD  dorzolamide (TRUSOPT) 2 % ophthalmic solution Place 1 drop into the left eye 3 (three) times daily.   Yes Historical Provider, MD  ibuprofen (ADVIL,MOTRIN) 200 MG tablet Take 400 mg by mouth daily as needed.   Yes Historical Provider, MD  pilocarpine (PILOCAR) 4 % ophthalmic solution Place 1 drop into the left eye 3 (three) times daily.   Yes Historical Provider, MD  prednisoLONE  acetate (PRED FORTE) 1 % ophthalmic suspension Place 1 drop into the left eye daily.    Yes Historical Provider, MD  cephALEXin (KEFLEX) 500 MG capsule Take 1 capsule (500 mg total) by mouth 4 (four) times daily. Patient not taking: Reported on 08/15/2014 06/06/14   Janne Napoleon, NP  HYDROcodone-acetaminophen (NORCO/VICODIN) 5-325 MG per tablet Take 1 tablet by mouth every 4 (four) hours as needed. Patient not taking: Reported on 08/15/2014 06/06/14   Janne Napoleon, NP  mupirocin nasal ointment (BACTROBAN) 2 % Apply very small amount between toes bid Patient not taking: Reported on 08/15/2014 06/06/14   Janne Napoleon, NP   BP 124/86 mmHg  Pulse 75  Temp(Src) 98.2 F (36.8 C) (Oral)  Resp 19  SpO2 100% Physical Exam  Constitutional: He is oriented to person, place, and time. He appears well-developed and well-nourished.  HENT:  Head: Normocephalic and atraumatic.  Nose: Nose normal.  Mouth/Throat: Oropharynx is clear and moist.  Eyes:  Right eye is prosthetic, left eye with irregular sclera, cloudy, minimally reactive cornea.  Extra ocular movements intact.    Cardiovascular: Normal rate and regular rhythm.   No murmur heard. Pulmonary/Chest: Effort normal and breath sounds normal. No respiratory distress.  Abdominal: Soft. There is no tenderness. There is no rebound and no guarding.  Musculoskeletal: He exhibits no edema or tenderness.  Neurological: He is alert and oriented to person, place, and time.  No asymmetry of facial muscles, 5/5 strength in all four extremities.  Sensation to light touch intact throughout all four extremities.    Skin: Skin is warm and dry.  Psychiatric: He has a normal mood and affect. His behavior is normal.  Nursing note and vitals reviewed.   ED Course  Procedures (including critical care time) Labs Review Labs Reviewed  CBC WITH DIFFERENTIAL/PLATELET - Abnormal; Notable for the following:    WBC 2.5 (*)    Platelets 91 (*)    Neutrophils Relative % 39 (*)     Neutro Abs 1.0 (*)    Lymphocytes Relative 47 (*)    All other components within normal limits  COMPREHENSIVE METABOLIC PANEL - Abnormal; Notable for the following:    Sodium 134 (*)    Glucose, Bld 136 (*)    BUN <5 (*)    AST 328 (*)    ALT 152 (*)    Alkaline Phosphatase 140 (*)    GFR calc non Af Amer 76 (*)    GFR calc Af Amer 88 (*)    All other  components within normal limits  I-STAT TROPOININ, ED    Imaging Review Dg Chest 2 View  08/15/2014   CLINICAL DATA:  Shortness of Breath, wheezing last week  EXAM: CHEST  2 VIEW  COMPARISON:  02/11/2012  FINDINGS: Cardiomediastinal silhouette is stable. No acute infiltrate or pleural effusion. No pulmonary edema. Bony thorax is unremarkable.  IMPRESSION: No active cardiopulmonary disease.   Electronically Signed   By: Lahoma Crocker M.D.   On: 08/15/2014 14:01   Ct Head Wo Contrast  08/15/2014   CLINICAL DATA:  Headache, shortness breath, chest pain  EXAM: CT HEAD WITHOUT CONTRAST  TECHNIQUE: Contiguous axial images were obtained from the base of the skull through the vertex without intravenous contrast.  COMPARISON:  None.  FINDINGS: There is no evidence of mass effect, midline shift or extra-axial fluid collections. There is no evidence of a space-occupying lesion or intracranial hemorrhage. There is no evidence of a cortical-based area of acute infarction.  The ventricles and sulci are appropriate for the patient's age. The basal cisterns are patent.  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 There is a left eye glaucoma valve. The visualized portions of the paranasal sinuses and mastoid air cells are unremarkable.  The osseous structures are unremarkable.  IMPRESSION: 1. No acute intracranial pathology.   Electronically Signed   By: Kathreen Devoid   On: 08/15/2014 16:06   US Abdomen Limited Ruq  08/15/2014   CLINICAL DATA:  Elevated liver enzymes.  EXAM: US ABDOMEN LIMITED - RIGHT UPPER QUADRANT  COMPARISON:  CT abdomen and pelvis 04/10/2013   FINDINGS: Gallbladder:  No gallstones or wall thickening visualized. No sonographic Murphy sign noted.  Common bile duct:  Diameter: 3.9 mm, normal  Liver:  Mild diffuse parenchymal echotexture consistent with diffuse fatty infiltration. No focal liver lesions identified.  IMPRESSION: Diffuse fatty infiltration of the liver. Normal appearance of the gallbladder and bile ducts.   Electronically Signed   By: Lucienne Capers M.D.   On: 08/15/2014 17:25     EKG Interpretation None      MDM   Final diagnoses:  Elevated liver enzymes  Thrombocytopenia  Acute nonintractable headache, unspecified headache type   patient here for evaluation of multiple complaints. In terms of headache, patient with history of migraine headache, headache is improved in the emergency department. Clinical picture is not consistent with subarachnoid hemorrhage, meningitis, epidural abscess or her dural sinus thrombosis. In terms of malaise and multiple symptoms, LFTs are elevated. Patient reports that he is a heavy drinker at times and last drink 3 days ago. Patient denies any Tylenol use or overdose. Abdominal ultrasound and presentation are not consistent with acute cholecystitis. Clinical picture is not consistent with cholangitis. Patient was likely chronic liver disease. Discussed the importance of PCP follow-up as well as close return precautions for progressive symptoms. Patient also referred to gastroenterology for further evaluation of his elevated liver enzymes.  Quintella Reichert, MD 08/15/14 2342

## 2014-08-15 NOTE — Discharge Instructions (Signed)
Stop drinking alcohol, this is causing damage to your liver.    Headaches, Frequently Asked Questions MIGRAINE HEADACHES Q: What is migraine? What causes it? How can I treat it? A: Generally, migraine headaches begin as a dull ache. Then they develop into a constant, throbbing, and pulsating pain. You may experience pain at the temples. You may experience pain at the front or back of one or both sides of the head. The pain is usually accompanied by a combination of:  Nausea.  Vomiting.  Sensitivity to light and noise. Some people (about 15%) experience an aura (see below) before an attack. The cause of migraine is believed to be chemical reactions in the brain. Treatment for migraine may include over-the-counter or prescription medications. It may also include self-help techniques. These include relaxation training and biofeedback.  Q: What is an aura? A: About 15% of people with migraine get an "aura". This is a sign of neurological symptoms that occur before a migraine headache. You may see wavy or jagged lines, dots, or flashing lights. You might experience tunnel vision or blind spots in one or both eyes. The aura can include visual or auditory hallucinations (something imagined). It may include disruptions in smell (such as strange odors), taste or touch. Other symptoms include:  Numbness.  A "pins and needles" sensation.  Difficulty in recalling or speaking the correct word. These neurological events may last as long as 60 minutes. These symptoms will fade as the headache begins. Q: What is a trigger? A: Certain physical or environmental factors can lead to or "trigger" a migraine. These include:  Foods.  Hormonal changes.  Weather.  Stress. It is important to remember that triggers are different for everyone. To help prevent migraine attacks, you need to figure out which triggers affect you. Keep a headache diary. This is a good way to track triggers. The diary will help you  talk to your healthcare professional about your condition. Q: Does weather affect migraines? A: Bright sunshine, hot, humid conditions, and drastic changes in barometric pressure may lead to, or "trigger," a migraine attack in some people. But studies have shown that weather does not act as a trigger for everyone with migraines. Q: What is the link between migraine and hormones? A: Hormones start and regulate many of your body's functions. Hormones keep your body in balance within a constantly changing environment. The levels of hormones in your body are unbalanced at times. Examples are during menstruation, pregnancy, or menopause. That can lead to a migraine attack. In fact, about three quarters of all women with migraine report that their attacks are related to the menstrual cycle.  Q: Is there an increased risk of stroke for migraine sufferers? A: The likelihood of a migraine attack causing a stroke is very remote. That is not to say that migraine sufferers cannot have a stroke associated with their migraines. In persons under age 59, the most common associated factor for stroke is migraine headache. But over the course of a person's normal life span, the occurrence of migraine headache may actually be associated with a reduced risk of dying from cerebrovascular disease due to stroke.  Q: What are acute medications for migraine? A: Acute medications are used to treat the pain of the headache after it has started. Examples over-the-counter medications, NSAIDs, ergots, and triptans.  Q: What are the triptans? A: Triptans are the newest class of abortive medications. They are specifically targeted to treat migraine. Triptans are vasoconstrictors. They moderate some chemical reactions  in the brain. The triptans work on receptors in your brain. Triptans help to restore the balance of a neurotransmitter called serotonin. Fluctuations in levels of serotonin are thought to be a main cause of migraine.  Q: Are  over-the-counter medications for migraine effective? A: Over-the-counter, or "OTC," medications may be effective in relieving mild to moderate pain and associated symptoms of migraine. But you should see your caregiver before beginning any treatment regimen for migraine.  Q: What are preventive medications for migraine? A: Preventive medications for migraine are sometimes referred to as "prophylactic" treatments. They are used to reduce the frequency, severity, and length of migraine attacks. Examples of preventive medications include antiepileptic medications, antidepressants, beta-blockers, calcium channel blockers, and NSAIDs (nonsteroidal anti-inflammatory drugs). Q: Why are anticonvulsants used to treat migraine? A: During the past few years, there has been an increased interest in antiepileptic drugs for the prevention of migraine. They are sometimes referred to as "anticonvulsants". Both epilepsy and migraine may be caused by similar reactions in the brain.  Q: Why are antidepressants used to treat migraine? A: Antidepressants are typically used to treat people with depression. They may reduce migraine frequency by regulating chemical levels, such as serotonin, in the brain.  Q: What alternative therapies are used to treat migraine? A: The term "alternative therapies" is often used to describe treatments considered outside the scope of conventional Western medicine. Examples of alternative therapy include acupuncture, acupressure, and yoga. Another common alternative treatment is herbal therapy. Some herbs are believed to relieve headache pain. Always discuss alternative therapies with your caregiver before proceeding. Some herbal products contain arsenic and other toxins. TENSION HEADACHES Q: What is a tension-type headache? What causes it? How can I treat it? A: Tension-type headaches occur randomly. They are often the result of temporary stress, anxiety, fatigue, or anger. Symptoms include  soreness in your temples, a tightening band-like sensation around your head (a "vice-like" ache). Symptoms can also include a pulling feeling, pressure sensations, and contracting head and neck muscles. The headache begins in your forehead, temples, or the back of your head and neck. Treatment for tension-type headache may include over-the-counter or prescription medications. Treatment may also include self-help techniques such as relaxation training and biofeedback. CLUSTER HEADACHES Q: What is a cluster headache? What causes it? How can I treat it? A: Cluster headache gets its name because the attacks come in groups. The pain arrives with little, if any, warning. It is usually on one side of the head. A tearing or bloodshot eye and a runny nose on the same side of the headache may also accompany the pain. Cluster headaches are believed to be caused by chemical reactions in the brain. They have been described as the most severe and intense of any headache type. Treatment for cluster headache includes prescription medication and oxygen. SINUS HEADACHES Q: What is a sinus headache? What causes it? How can I treat it? A: When a cavity in the bones of the face and skull (a sinus) becomes inflamed, the inflammation will cause localized pain. This condition is usually the result of an allergic reaction, a tumor, or an infection. If your headache is caused by a sinus blockage, such as an infection, you will probably have a fever. An x-ray will confirm a sinus blockage. Your caregiver's treatment might include antibiotics for the infection, as well as antihistamines or decongestants.  REBOUND HEADACHES Q: What is a rebound headache? What causes it? How can I treat it? A: A pattern of taking acute  headache medications too often can lead to a condition known as "rebound headache." A pattern of taking too much headache medication includes taking it more than 2 days per week or in excessive amounts. That means more  than the label or a caregiver advises. With rebound headaches, your medications not only stop relieving pain, they actually begin to cause headaches. Doctors treat rebound headache by tapering the medication that is being overused. Sometimes your caregiver will gradually substitute a different type of treatment or medication. Stopping may be a challenge. Regularly overusing a medication increases the potential for serious side effects. Consult a caregiver if you regularly use headache medications more than 2 days per week or more than the label advises. ADDITIONAL QUESTIONS AND ANSWERS Q: What is biofeedback? A: Biofeedback is a self-help treatment. Biofeedback uses special equipment to monitor your body's involuntary physical responses. Biofeedback monitors:  Breathing.  Pulse.  Heart rate.  Temperature.  Muscle tension.  Brain activity. Biofeedback helps you refine and perfect your relaxation exercises. You learn to control the physical responses that are related to stress. Once the technique has been mastered, you do not need the equipment any more. Q: Are headaches hereditary? A: Four out of five (80%) of people that suffer report a family history of migraine. Scientists are not sure if this is genetic or a family predisposition. Despite the uncertainty, a child has a 50% chance of having migraine if one parent suffers. The child has a 75% chance if both parents suffer.  Q: Can children get headaches? A: By the time they reach high school, most young people have experienced some type of headache. Many safe and effective approaches or medications can prevent a headache from occurring or stop it after it has begun.  Q: What type of doctor should I see to diagnose and treat my headache? A: Start with your primary caregiver. Discuss his or her experience and approach to headaches. Discuss methods of classification, diagnosis, and treatment. Your caregiver may decide to recommend you to a  headache specialist, depending upon your symptoms or other physical conditions. Having diabetes, allergies, etc., may require a more comprehensive and inclusive approach to your headache. The National Headache Foundation will provide, upon request, a list of [facility] physician members in your state. Document Released: 08/14/2003 Document Revised: 08/16/2011 Document Reviewed: 01/22/2008 [facility] Patient Information 2015 [place], [place]. This information is not intended to replace advice given to you by your health care provider. Make sure you discuss any questions you have with your health care provider.  Thrombocytopenia Thrombocytopenia means there are not enough platelets in your blood. Platelets are tiny cells in your blood. When you start bleeding, platelets clump together around the cut or injury to stop the bleeding. This process is called blood clotting. Not having enough platelets can cause bleeding problems. HOME CARE  Check your skin and inside your mouth for bruises or blood as told by your doctor.  Check your spit (sputum), pee (urine), and poop (stool) for blood as told by your doctor.  Do not do activities that can cause bumps or bruises until your doctor says it is okay.  Be careful not to cut yourself when you shave or use scissors, needles, knives, or other tools.  Be careful not to burn yourself when you iron or cook.  Ask your doctor if you can drink alcohol.  Only take medicines as told by your doctor.  Tell all your doctors and your dentist that you have this bleeding problem. GET  HELP RIGHT AWAY IF:  You are bleeding anywhere on your body.  You are bleeding or have bruises without knowing why.  You have blood in your spit, pee, or poop. MAKE SURE YOU:  Understand these instructions.  Will watch your condition.  Will get help right away if you are not doing well or get worse. Document Released: 05/13/2011 Document Revised: 08/16/2011 Document Reviewed:  05/13/2011 [facility] Patient Information 2015 [place], [place]. This information is not intended to replace advice given to you by your health care provider. Make sure you discuss any questions you have with your health care provider. Cirrhosis Cirrhosis is a condition of scarring of the liver which is caused when the liver has tried repairing itself following damage. This damage may come from a previous infection such as one of the forms of hepatitis (usually hepatitis C), or the damage may come from being injured by toxins. The main toxin that causes this damage is alcohol. The scarring of the liver from use of alcohol is irreversible. That means the liver cannot return to normal even though alcohol is not used any more. The main danger of hepatitis C infection is that it may cause long-lasting (chronic) liver disease, and this also may lead to cirrhosis. This complication is progressive and irreversible. CAUSES  Prior to available blood tests, hepatitis C could be contracted by blood transfusions. Since testing of blood has improved, this is now unlikely. This infection can also be contracted through intravenous drug use and the sharing of needles. It can also be contracted through sexual relationships. The injury caused by alcohol comes from too much use. It is not a few drinks that poison the liver, but years of misuse. Usually there will be some signs and symptoms early with scarring of the liver that suggest the development of better habits. Alcohol should never be used while using acetaminophen. A small dose of both taken together may cause irreversible damage to the liver. HOME CARE INSTRUCTIONS  There is no specific treatment for cirrhosis. However, there are things you can do to avoid making the condition worse.  Rest as needed.  Eat a well-balanced diet. Your caregiver can help you with suggestions.  Vitamin supplements including vitamins A, K, D, and thiamine can help.  A low-salt diet,  water restriction, or diuretic medicine may be needed to reduce fluid retention.  Avoid alcohol. This can be extremely toxic if combined with acetaminophen.  Avoid drugs which are toxic to the liver. Some of these include isoniazid, methyldopa, acetaminophen, anabolic steroids (muscle-building drugs), erythromycin, and oral contraceptives (birth control pills). Check with your caregiver to make sure medicines you are presently taking will not be harmful.  Periodic blood tests may be required. Follow your caregiver's advice regarding the timing of these.  Milk thistle is an herbal remedy which does protect the liver against toxins. However, it will not help once the liver has been scarred. SEEK MEDICAL CARE IF:  You have increasing fatigue or weakness.  You develop swelling of the hands, feet, legs, or face.  You vomit bright red blood, or a coffee ground appearing material.  You have blood in your stools, or the stools turn black and tarry.  You have a fever.  You develop loss of appetite, or have nausea and vomiting.  You develop jaundice.  You develop easy bruising or bleeding.  You have worsening of any of the problems you are concerned about. Document Released: 05/24/2005 Document Revised: 08/16/2011 Document Reviewed: 01/10/2008 [employer] Patient Information  2015 [employer]. This information is not intended to replace advice given to you by your health care provider. Make sure you discuss any questions you have with your health care provider.

## 2014-08-15 NOTE — Care Management (Signed)
ED CM received call from patient concerning discharge, prescriptiion. Clarified with patient that he was not prescribed any medication upon discharge as per Dr. Ralene Bathe. Patient verbalized understanding no further ED CM needs identified.

## 2014-08-15 NOTE — ED Notes (Signed)
Pt presents to department for evaluation of headache, SOB and diffuse chest pain. Ongoing x3 days. Pt reports 9/10 headache upon arrival to ED. Respirations unlabored. Pt is alert and oriented x4.

## 2014-09-11 ENCOUNTER — Ambulatory Visit: Payer: Medicare Other | Admitting: Gastroenterology

## 2014-10-04 ENCOUNTER — Ambulatory Visit: Payer: Medicare Other | Admitting: Internal Medicine

## 2014-10-21 ENCOUNTER — Ambulatory Visit: Payer: Medicare Other | Admitting: Gastroenterology

## 2015-03-11 ENCOUNTER — Encounter (HOSPITAL_COMMUNITY): Payer: Self-pay | Admitting: Emergency Medicine

## 2015-03-11 ENCOUNTER — Emergency Department (HOSPITAL_COMMUNITY)
Admission: EM | Admit: 2015-03-11 | Discharge: 2015-03-11 | Disposition: A | Discharge status: Home / Self Care | Payer: Medicare Other | Attending: Emergency Medicine | Admitting: Emergency Medicine

## 2015-03-11 DIAGNOSIS — M1990 Unspecified osteoarthritis, unspecified site: Secondary | ICD-10-CM | POA: Insufficient documentation

## 2015-03-11 DIAGNOSIS — S46811A Strain of other muscles, fascia and tendons at shoulder and upper arm level, right arm, initial encounter: Secondary | ICD-10-CM

## 2015-03-11 DIAGNOSIS — Y998 Other external cause status: Secondary | ICD-10-CM | POA: Diagnosis not present

## 2015-03-11 DIAGNOSIS — F419 Anxiety disorder, unspecified: Secondary | ICD-10-CM | POA: Insufficient documentation

## 2015-03-11 DIAGNOSIS — Y92410 Unspecified street and highway as the place of occurrence of the external cause: Secondary | ICD-10-CM | POA: Diagnosis not present

## 2015-03-11 DIAGNOSIS — Z79899 Other long term (current) drug therapy: Secondary | ICD-10-CM | POA: Diagnosis not present

## 2015-03-11 DIAGNOSIS — Z8781 Personal history of (healed) traumatic fracture: Secondary | ICD-10-CM | POA: Diagnosis not present

## 2015-03-11 DIAGNOSIS — H540 Blindness, both eyes: Secondary | ICD-10-CM | POA: Diagnosis not present

## 2015-03-11 DIAGNOSIS — Y9389 Activity, other specified: Secondary | ICD-10-CM | POA: Insufficient documentation

## 2015-03-11 DIAGNOSIS — Z720 Tobacco use: Secondary | ICD-10-CM | POA: Diagnosis not present

## 2015-03-11 DIAGNOSIS — S4991XA Unspecified injury of right shoulder and upper arm, initial encounter: Secondary | ICD-10-CM | POA: Diagnosis present

## 2015-03-11 DIAGNOSIS — I10 Essential (primary) hypertension: Secondary | ICD-10-CM | POA: Diagnosis not present

## 2015-03-11 DIAGNOSIS — Z87440 Personal history of urinary (tract) infections: Secondary | ICD-10-CM | POA: Diagnosis not present

## 2015-03-11 DIAGNOSIS — S46911A Strain of unspecified muscle, fascia and tendon at shoulder and upper arm level, right arm, initial encounter: Secondary | ICD-10-CM | POA: Diagnosis not present

## 2015-03-11 MED ORDER — CYCLOBENZAPRINE HCL 10 MG PO TABS: 10.0000 mg | ORAL_TABLET | Freq: Two times a day (BID) | ORAL | Status: DC | PRN
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
## 2015-03-11 NOTE — Discharge Instructions (Signed)
Please read/information, please use medication only as directed. Please follow up with her primary care for reevaluation
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
## 2015-08-04 ENCOUNTER — Emergency Department (INDEPENDENT_AMBULATORY_CARE_PROVIDER_SITE_OTHER)
Admission: EM | Admit: 2015-08-04 | Discharge: 2015-08-04 | Disposition: A | Discharge status: Home / Self Care | Payer: Medicare Other | Source: Home / Self Care | Attending: Family Medicine | Admitting: Family Medicine

## 2015-08-04 ENCOUNTER — Encounter (HOSPITAL_COMMUNITY): Payer: Self-pay | Admitting: Emergency Medicine

## 2015-08-04 ENCOUNTER — Other Ambulatory Visit (HOSPITAL_COMMUNITY)
Admission: RE | Admit: 2015-08-04 | Discharge: 2015-08-04 | Disposition: A | Discharge status: Home / Self Care | Payer: Medicare Other | Source: Ambulatory Visit | Attending: Family Medicine | Admitting: Family Medicine

## 2015-08-04 DIAGNOSIS — Z113 Encounter for screening for infections with a predominantly sexual mode of transmission: Secondary | ICD-10-CM | POA: Diagnosis present
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
## 2015-08-04 NOTE — Discharge Instructions (Signed)
No external signs of STD Gonorrhea Testing Gonorrhea is a bacterial infection that spreads from person to person through sexual contact. It can also spread from a mother to her baby during childbirth. You may be tested for gonorrhea if:  You have symptoms of gonorrhea.  You are pregnant or plan to get pregnant.  You have multiple sexual partners. There are three gonorrhea tests:  Nucleic acid amplification test. For this test, you will provide a urine sample or your health care provider will use a swab to collect a fluid sample from the area of possible infection.  Nucleic acid hybridization test. For this test, your health care provider will use a swab to collect a fluid sample from the penis or vagina.  Gonorrhea culture. For this test, your health care provider will use a swab to collect a sample of bodily fluid from the area of possible infection. This test may be done in cases of sexual assault for legal reasons. In all three methods of testing, the sample is sent to a laboratory for analysis. PREPARATION FOR TEST  Let your health care provider know if you are taking antibiotic medicine.  Do not use vaginal creams or douches.  Try to arrive with a full bladder. RESULTS It is your responsibility to obtain your test results. Ask the lab or department performing the test when and how you will get your results. Contact your health care provider to discuss any questions you have about your results. The results of your gonorrhea test will either be negative or positive. Meaning of Negative Test Results If your test result is negative, then it is most likely that you do not have the disease. Meaning of Positive Test Results If your test result is positive, you have an active gonorrhea infection. Notify any sexual partners so that they can also be tested.   This information is not intended to replace advice given to you by your health care provider. Make sure you discuss any questions  you have with your health care provider.   Document Released: 06/25/2004 Document Revised: 06/14/2014 Document Reviewed: 08/27/2013 Elsevier Interactive Patient Education 2016 Else[employer]  Trichomonas Test The trichomonas test is done to diagnose trichomoniasis, an infection caused by an organism called Trichomonas. Trichomoniasis is a sexually transmitted infection (STI). In women, it causes vaginal infections. In men, it can cause the tube that carries urine (urethra) to become inflamed (urethritis). You may have this test as a part of a routine screening for STIs or if you have symptoms of trichomoniasis. To perform the test, your health care provider will take a sample of discharge. The sample is taken from the vagina or cervix in women and from the urethra in men. A urine sample can also be used for testing. RESULTS It is your responsibility to obtain your test results. Ask the lab or department performing the test when and how you will get your results. Contact your health care provider to discuss any questions you have about your results.  Meaning of Negative Test Results A negative test means you do not have trichomoniasis. Follow your health care provider's directions about any follow-up testing.  Meaning of Positive Test Results A positive test result means you have an active infection that needs to be treated with antibiotic medicine. All your current sexual partners must also be treated or it is likely you will get reinfected.  If your test is positive, your health care provider will start you on medicine and may advise you to:  Not have sexual intercourse until your infection has cleared up.  Use a latex condom properly every time you have sexual intercourse.  Limit the number of sexual partners you have. The more partners you have, the greater your risk of contracting trichomoniasis or another STI.  Tell all sexual partners about your infection so they can also be treated and  to prevent reinfection.   This information is not intended to replace advice given to you by your health care provider. Make sure you discuss any questions you have with your health care provider.   Document Released: 06/26/2004 Document Revised: 06/14/2014 Document Reviewed: 06/05/2013 Elsevier Interactive Patient Education [employer].

## 2015-08-04 NOTE — ED Provider Notes (Signed)
CSN: [id number]     Arrival date & time 08/04/15  1848 History   First MD Initiated Contact with Patient 08/04/15 1955     Chief Complaint  Patient presents with  . Exposure to STD   (Consider location/radiation/quality/duration/timing/severity/associated sxs/prior Treatment) HPI Comments: 50 year old male resents to the urgent care after his girlfriend told him that she had some sort of infection. He thinks that she is referring to a bladder infection greatest infection but not sure. She told him to get checked out. The patient is asymptomatic.   Past Medical History  Diagnosis Date  . Glaucoma   . Wrist fracture, right   . Foot and toe(s), blister     bilat feet, blistering between toes  . Weight loss     25 lb wt loss since 08/2010  . Hypertension     hx of  . UTI (lower urinary tract infection) 08/04/11    finishing abx tomorrow  . Headache(784.0)   . Anxiety   . Arthritis   . Blind in both eyes     sees lights and shadows  . Elevated LFTs    Past Surgical History  Procedure Laterality Date  . Eye surgery Left   . Fracture surgery Right     wrist  . Multiple tooth extractions      for dentures  . Foot srugery      removal of bone near small toe  . Hardware removal  08/06/2011    Procedure: HARDWARE REMOVAL;  Surgeon: Newt Minion, MD;  Location: [facility];  Service: Orthopedics;  Laterality: Right;  Removal Deep Retained Hardware Right Distal Radius  . Eye surgery Right     prostetic eye  . Esophagogastroduodenoscopy     Family History  Problem Relation Age of Onset  . Diabetes Sister   . Diabetes Maternal Aunt     aunt and uncles  . Hypertension Sister     aunt and uncles maternal side  . Colon cancer Neg Hx    Social History  Substance Use Topics  . Smoking status: Current Every Day Smoker -- 1.00 packs/day    Types: Cigarettes, Cigars  . Smokeless tobacco: Never Used     Comment: 1/2 - 1 cigar/day  . Alcohol Use: 3.5 oz/week    7 Standard drinks or  equivalent per week    Review of Systems  Constitutional: Negative.   Respiratory: Negative.   Genitourinary: Negative.   Skin: Negative.   Neurological: Negative.   All other systems reviewed and are negative.   Allergies  Review of patient's allergies indicates no known allergies.  Home Medications   Prior to Admission medications   Medication Sig Start Date End Date Taking? Authorizing Provider  brimonidine (ALPHAGAN) 0.2 % ophthalmic solution Place 1 drop into the left eye 2 (two) times daily.    Historical Provider, MD  cephALEXin (KEFLEX) 500 MG capsule Take 1 capsule (500 mg total) by mouth 4 (four) times daily. Patient not taking: Reported on 08/15/2014 06/06/14   Janne Napoleon, NP  cyclobenzaprine (FLEXERIL) 10 MG tablet Take 1 tablet (10 mg total) by mouth 2 (two) times daily as needed for muscle spasms. 03/11/15   Okey Regal, PA-C  dorzolamide (TRUSOPT) 2 % ophthalmic solution Place 1 drop into the left eye 3 (three) times daily.    Historical Provider, MD  HYDROcodone-acetaminophen (NORCO/VICODIN) 5-325 MG per tablet Take 1 tablet by mouth every 4 (four) hours as needed. Patient not taking: Reported on 08/15/2014 06/06/14  Janne Napoleon, NP  ibuprofen (ADVIL,MOTRIN) 200 MG tablet Take 400 mg by mouth daily as needed.    Historical Provider, MD  mupirocin nasal ointment (BACTROBAN) 2 % Apply very small amount between toes bid Patient not taking: Reported on 08/15/2014 06/06/14   Janne Napoleon, NP  pilocarpine (PILOCAR) 4 % ophthalmic solution Place 1 drop into the left eye 3 (three) times daily.    Historical Provider, MD  prednisoLONE acetate (PRED FORTE) 1 % ophthalmic suspension Place 1 drop into the left eye daily.     Historical Provider, MD   Meds Ordered and Administered this Visit  Medications - No data to display  BP 146/96 mmHg  Pulse 74  Temp(Src) 97.6 F (36.4 C) (Oral)  Resp 16  SpO2 100% No data found.   Physical Exam  Constitutional: He is oriented to  person, place, and time. He appears well-developed and well-nourished. No distress.  Eyes: EOM are normal.  Neck: Normal range of motion. Neck supple.  Cardiovascular: Normal rate.   Pulmonary/Chest: Effort normal. No respiratory distress.  Genitourinary: Penis normal.  Normal external male genitalia. Normal phallus. Bilateral testicles descended, normal shape and orientation. No tenderness to the testicles or the epididymal apparatus. No swelling to the genitals. No urethral discharge. No external skin lesions. No lymphadenopathy.  Musculoskeletal: He exhibits no edema.  Neurological: He is alert and oriented to person, place, and time. He exhibits normal muscle tone.  Skin: Skin is warm and dry.  Psychiatric: He has a normal mood and affect.  Nursing note and vitals reviewed.   ED Course  Procedures (including critical care time)  Labs Review Labs Reviewed  URINE CYTOLOGY ANCILLARY ONLY    Imaging Review No results found.   Visual Acuity Review  Right Eye Distance:   Left Eye Distance:   Bilateral Distance:    Right Eye Near:   Left Eye Near:    Bilateral Near:         MDM   1. Screen for STD (sexually transmitted disease)    No external signs of STD Urine cytology pending    Janne Napoleon, NP 08/04/15 2045

## 2015-08-06 ENCOUNTER — Telehealth: Payer: Self-pay | Admitting: Internal Medicine

## 2015-08-06 DIAGNOSIS — A5900 Urogenital trichomoniasis, unspecified: Secondary | ICD-10-CM

## 2015-08-06 LAB — URINE CYTOLOGY ANCILLARY ONLY
Chlamydia: NEGATIVE
Neisseria Gonorrhea: NEGATIVE
Trichomonas: POSITIVE — AB

## 2015-08-06 MED ORDER — METRONIDAZOLE 500 MG PO TABS: 500.0000 mg | ORAL_TABLET | Freq: Two times a day (BID) | ORAL | Status: DC

## 2015-08-06 NOTE — ED Notes (Signed)
Please let patient know that test for trichomonas was positive.  Will send rx for metronidazole to pharmacy of record [facility] [place] [facility]). Tests for gonorrhea and chlamydia were negative.  LM  Sherlene Shams, MD 08/06/15 [phone]

## 2015-08-07 ENCOUNTER — Telehealth (HOSPITAL_COMMUNITY): Payer: Self-pay | Admitting: Emergency Medicine

## 2015-08-07 NOTE — ED Notes (Signed)
Pt called back... notified of recent lab results from visit 2/27 Pt ID'd properly... Reports feeling better and sx have subsided  Per Dr. Valere Dross,  Please let patient know that test for trichomonas was positive.  Will send rx for metronidazole to pharmacy of record [facility]). Recheck or followup pcp/Takela Anderson for persistent symptoms. Tests for gonorrhea and chlamydia were negative. LM  Adv pt if sx are not getting better to return  Pt verb understanding Education on safe sex given

## 2015-08-07 NOTE — ED Notes (Signed)
x1 attempt  LM on pt's VM [phone] Need to give lab results from recent visit on 2/27  Per Dr. Valere Dross,  Please let patient know that test for trichomonas was positive.  Will send rx for metronidazole to pharmacy of record [facility] [place] [facility]). Recheck or followup pcp/Takela Anderson for persistent symptoms. Tests for gonorrhea and chlamydia were negative. LM  Will try later.

## 2015-10-10 ENCOUNTER — Emergency Department (HOSPITAL_COMMUNITY)
Admission: EM | Admit: 2015-10-10 | Discharge: 2015-10-10 | Disposition: A | Discharge status: Home / Self Care | Payer: Medicare Other | Attending: Emergency Medicine | Admitting: Emergency Medicine

## 2015-10-10 ENCOUNTER — Emergency Department (HOSPITAL_COMMUNITY): Payer: Medicare Other

## 2015-10-10 ENCOUNTER — Encounter (HOSPITAL_COMMUNITY): Payer: Self-pay

## 2015-10-10 DIAGNOSIS — M545 Low back pain: Secondary | ICD-10-CM | POA: Diagnosis not present

## 2015-10-10 DIAGNOSIS — Z792 Long term (current) use of antibiotics: Secondary | ICD-10-CM | POA: Insufficient documentation

## 2015-10-10 DIAGNOSIS — Z79891 Long term (current) use of opiate analgesic: Secondary | ICD-10-CM | POA: Insufficient documentation

## 2015-10-10 DIAGNOSIS — Z79899 Other long term (current) drug therapy: Secondary | ICD-10-CM | POA: Insufficient documentation

## 2015-10-10 DIAGNOSIS — M1990 Unspecified osteoarthritis, unspecified site: Secondary | ICD-10-CM | POA: Insufficient documentation

## 2015-10-10 DIAGNOSIS — M549 Dorsalgia, unspecified: Secondary | ICD-10-CM

## 2015-10-10 DIAGNOSIS — Z791 Long term (current) use of non-steroidal anti-inflammatories (NSAID): Secondary | ICD-10-CM | POA: Diagnosis not present

## 2015-10-10 DIAGNOSIS — I10 Essential (primary) hypertension: Secondary | ICD-10-CM | POA: Insufficient documentation

## 2015-10-10 DIAGNOSIS — F17210 Nicotine dependence, cigarettes, uncomplicated: Secondary | ICD-10-CM | POA: Diagnosis not present

## 2015-10-10 MED ORDER — OXYCODONE-ACETAMINOPHEN 5-325 MG PO TABS
2.0000 | ORAL_TABLET | Freq: Once | ORAL | Status: AC
  Administered 2015-10-10: 2 via ORAL
  Filled 2015-10-10: qty 2

## 2015-10-10 MED ORDER — PREDNISONE 20 MG PO TABS: ORAL_TABLET | ORAL | Status: DC

## 2015-10-10 MED ORDER — OXYCODONE-ACETAMINOPHEN 5-325 MG PO TABS: 1.0000 | ORAL_TABLET | ORAL | Status: DC | PRN

## 2015-10-10 NOTE — ED Notes (Signed)
Bed: WTR9 Expected date:  Expected time:  Means of arrival:  Comments: EMS-back pain

## 2015-10-10 NOTE — ED Notes (Signed)
[facility] called regarding transportation to patient's home. Patient is seeing impaired.

## 2015-10-10 NOTE — ED Notes (Signed)
Patient transported to X-ray 

## 2015-10-10 NOTE — ED Provider Notes (Signed)
CSN: [id number]     Arrival date & time 10/10/15  1324 History   First MD Initiated Contact with Patient 10/10/15 1533     Chief Complaint  Patient presents with  . Back Pain     (Consider location/radiation/quality/duration/timing/severity/associated sxs/prior Treatment) Patient is a 50 y.o. male presenting with back pain. The history is provided by the patient and medical records.  Back Pain   50 y.o. M with hx of HTN, UTI, headaches, anxiety, arthritis, bilateral blindness, presenting to the ED for back pain.  Patient reports he has been having right-sided back pain for the past 3 days. He states he did have a fall 3 days ago when he tripped over his own feet and landed on his bilateral knees. He denies any direct trauma to his back. No head injury or loss of consciousness. He states since this time he's had pain in his low back, worse on the right side. He reports some radiation of pain into his right hip and right thigh. No numbness or weakness. No bowel or bladder incontinence. No dysuria or urinary frequency. No flank pain.  He denies any fever or chills.  He has been taking Tylenol at home without relief. Patient generally walks with a cane/walking stick but does not have it with him today as he took EMS to the ED.  Vital signs stable.  Past Medical History  Diagnosis Date  . Glaucoma   . Wrist fracture, right   . Foot and toe(s), blister     bilat feet, blistering between toes  . Weight loss     25 lb wt loss since 08/2010  . Hypertension     hx of  . UTI (lower urinary tract infection) 08/04/11    finishing abx tomorrow  . Headache(784.0)   . Anxiety   . Arthritis   . Blind in both eyes     sees lights and shadows  . Elevated LFTs    Past Surgical History  Procedure Laterality Date  . Eye surgery Left   . Fracture surgery Right     wrist  . Multiple tooth extractions      for dentures  . Foot srugery      removal of bone near small toe  . Hardware removal  08/06/2011      Procedure: HARDWARE REMOVAL;  Surgeon: Newt Minion, MD;  Location: [facility];  Service: Orthopedics;  Laterality: Right;  Removal Deep Retained Hardware Right Distal Radius  . Eye surgery Right     prostetic eye  . Esophagogastroduodenoscopy     Family History  Problem Relation Age of Onset  . Diabetes Sister   . Diabetes Maternal Aunt     aunt and uncles  . Hypertension Sister     aunt and uncles maternal side  . Colon cancer Neg Hx    Social History  Substance Use Topics  . Smoking status: Current Every Day Smoker -- 1.00 packs/day    Types: Cigarettes, Cigars  . Smokeless tobacco: Never Used     Comment: 1/2 - 1 cigar/day  . Alcohol Use: 3.5 oz/week    7 Standard drinks or equivalent per week    Review of Systems  Musculoskeletal: Positive for back pain.  All other systems reviewed and are negative.     Allergies  Review of patient's allergies indicates no known allergies.  Home Medications   Prior to Admission medications   Medication Sig Start Date End Date Taking? Authorizing Provider  brimonidine [facility])  0.2 % ophthalmic solution Place 1 drop into the left eye 2 (two) times daily.    Historical Provider, MD  cephALEXin (KEFLEX) 500 MG capsule Take 1 capsule (500 mg total) by mouth 4 (four) times daily. Patient not taking: Reported on 08/15/2014 06/06/14   Janne Napoleon, NP  cyclobenzaprine (FLEXERIL) 10 MG tablet Take 1 tablet (10 mg total) by mouth 2 (two) times daily as needed for muscle spasms. 03/11/15   Okey Regal, PA-C  dorzolamide (TRUSOPT) 2 % ophthalmic solution Place 1 drop into the left eye 3 (three) times daily.    Historical Provider, MD  HYDROcodone-acetaminophen (NORCO/VICODIN) 5-325 MG per tablet Take 1 tablet by mouth every 4 (four) hours as needed. Patient not taking: Reported on 08/15/2014 06/06/14   Janne Napoleon, NP  ibuprofen (ADVIL,MOTRIN) 200 MG tablet Take 400 mg by mouth daily as needed.    Historical Provider, MD  metroNIDAZOLE (FLAGYL)  500 MG tablet Take 1 tablet (500 mg total) by mouth 2 (two) times daily. 08/06/15   Sherlene Shams, MD  mupirocin nasal ointment (BACTROBAN) 2 % Apply very small amount between toes bid Patient not taking: Reported on 08/15/2014 06/06/14   Janne Napoleon, NP  pilocarpine (PILOCAR) 4 % ophthalmic solution Place 1 drop into the left eye 3 (three) times daily.    Historical Provider, MD  prednisoLONE acetate (PRED FORTE) 1 % ophthalmic suspension Place 1 drop into the left eye daily.     Historical Provider, MD   BP 143/98 mmHg  Pulse 77  Temp(Src) 99.8 F (37.7 C) (Oral)  Resp 18  Ht 5\' 7"  (1.702 m)  SpO2 96%   Physical Exam  Constitutional: He is oriented to person, place, and time. He appears well-developed and well-nourished. No distress.  HENT:  Head: Normocephalic and atraumatic.  Mouth/Throat: Oropharynx is clear and moist.  Eyes:  Patient is blind  Neck: Normal range of motion. Neck supple.  Cardiovascular: Normal rate, regular rhythm and normal heart sounds.   Pulmonary/Chest: Effort normal and breath sounds normal. No respiratory distress. He has no wheezes.  Abdominal: Soft. Bowel sounds are normal. There is no tenderness. There is CVA tenderness. There is no guarding.  Musculoskeletal: Normal range of motion. He exhibits no edema.       Lumbar back: He exhibits tenderness, bony tenderness and pain. He exhibits no spasm.       Back:  TTP of midline LS and sacrum with radiation to the right SI joint; no noted deformities or step-off; full ROM of all spinal levels without difficulty; normal strength and sensation of BLE  Neurological: He is alert and oriented to person, place, and time.  Skin: Skin is warm and dry. He is not diaphoretic.  Psychiatric: He has a normal mood and affect.  Nursing note and vitals reviewed.   ED Course  Procedures (including critical care time) Labs Review Labs Reviewed - No data to display  Imaging Review Dg Lumbar Spine Complete  10/10/2015   CLINICAL DATA:  Low back pain with increasing severity today. Pain extends into the right lower extremity. EXAM: LUMBAR SPINE - COMPLETE 4+ VIEW COMPARISON:  CT of the abdomen and pelvis 04/10/2013. FINDINGS: Five non rib-bearing lumbar type vertebral bodies are present. Mild leftward curvature of the lumbar spine is again noted. Atherosclerotic calcifications are present in the aorta without aneurysm. IMPRESSION: 1. No acute or focal abnormality to explain the patient's symptoms. 2. Slight leftward curvature of the lumbar spine. 3. Atherosclerosis. Electronically Signed  By: San Morelle M.D.   On: 10/10/2015 16:17   Dg Sacrum/coccyx  10/10/2015  CLINICAL DATA:  Low back pain beginning yesterday is more severe today. EXAM: SACRUM AND COCCYX - 2+ VIEW COMPARISON:  CT of the abdomen and pelvis 04/10/2013. FINDINGS: Two views of the sacrum demonstrate no acute or healing fracture. The pelvis is intact. IMPRESSION: Negative radiographs of the sacrum. Electronically Signed   By: San Morelle M.D.   On: 10/10/2015 16:17   I have personally reviewed and evaluated these images and lab results as part of my medical decision-making.   EKG Interpretation None      MDM   Final diagnoses:  Back pain, unspecified location   51 year old male here with back pain after fall 3 days ago. No direct trauma to his back. Patient is afebrile, nontoxic. He has tenderness of his lumbar spine with radiation to the right SI joint without noted deformity. He has normal strength and sensation of bilateral lower extremities.  X-rays were obtained, no acute findings aside form slight leftward curvature of lumbar spine which is likely chronic.  Pain improved with percocet here in ED.  No neurologic deficits or red flag symptoms today to suggest cauda equina or other emergent spinal pathology.  Suspect lumbar radiculopathy/sciatica.  D/c home with percocet, prednisone.  Encouraged to follow-up with PCP.  Discussed  plan with patient, he/she acknowledged understanding and agreed with plan of care.  Return precautions given for new or worsening symptoms.  Larene Pickett, PA-C 10/10/15 1840  Leo Grosser, MD 10/13/15 [phone]

## 2015-10-10 NOTE — ED Notes (Signed)
[facility] notified that the request for transport needs to be cancelled due to patient having a ride home from a friend.

## 2015-10-10 NOTE — ED Notes (Addendum)
According to EMS, pt c/o of non-traumatic right lower back pain that started yesterday. Pt experienced a mechanical fall and landed on his knees x3 days ago. Pt A+OX4, speaking in complete sentences, ambulatory w/ assistance. Pt states he as been taking generic tylenol w/ no relief. Pt visually impaired/blind.

## 2015-10-10 NOTE — Discharge Instructions (Signed)
Take the prescribed medication as directed.  Use caution when taking percocet, it can make you sleep/drowsy. Follow-up with your primary care doctor.   Return to the ED for new or worsening symptoms.

## 2016-09-13 ENCOUNTER — Ambulatory Visit (HOSPITAL_COMMUNITY)
Admission: EM | Admit: 2016-09-13 | Discharge: 2016-09-13 | Disposition: A | Discharge status: Home / Self Care | Payer: Medicare Other | Attending: Internal Medicine | Admitting: Internal Medicine

## 2016-09-13 ENCOUNTER — Encounter (HOSPITAL_COMMUNITY): Payer: Self-pay | Admitting: *Deleted

## 2016-09-13 DIAGNOSIS — R109 Unspecified abdominal pain: Secondary | ICD-10-CM | POA: Diagnosis not present

## 2016-09-13 DIAGNOSIS — N342 Other urethritis: Secondary | ICD-10-CM

## 2016-09-13 DIAGNOSIS — R197 Diarrhea, unspecified: Secondary | ICD-10-CM | POA: Diagnosis not present

## 2016-09-13 DIAGNOSIS — R300 Dysuria: Secondary | ICD-10-CM | POA: Diagnosis not present

## 2016-09-13 DIAGNOSIS — Z202 Contact with and (suspected) exposure to infections with a predominantly sexual mode of transmission: Secondary | ICD-10-CM

## 2016-09-13 DIAGNOSIS — R3129 Other microscopic hematuria: Secondary | ICD-10-CM

## 2016-09-13 LAB — POCT URINALYSIS DIP (DEVICE)
Bilirubin Urine: NEGATIVE
Glucose, UA: NEGATIVE mg/dL
Leukocytes, UA: NEGATIVE
Nitrite: NEGATIVE
PROTEIN: NEGATIVE mg/dL
Specific Gravity, Urine: 1.02 (ref 1.005–1.030)
UROBILINOGEN UA: 2 mg/dL — AB (ref 0.0–1.0)
pH: 6 (ref 5.0–8.0)

## 2016-09-13 MED ORDER — PHENAZOPYRIDINE HCL 200 MG PO TABS: 200.0000 mg | ORAL_TABLET | Freq: Three times a day (TID) | ORAL | 0 refills | Status: DC

## 2016-09-13 MED ORDER — CEFTRIAXONE SODIUM 250 MG IJ SOLR
INTRAMUSCULAR | Status: AC
  Filled 2016-09-13: qty 250

## 2016-09-13 MED ORDER — LIDOCAINE HCL (PF) 1 % IJ SOLN
INTRAMUSCULAR | Status: AC
  Filled 2016-09-13: qty 2

## 2016-09-13 MED ORDER — CEFTRIAXONE SODIUM 250 MG IJ SOLR
250.0000 mg | Freq: Once | INTRAMUSCULAR | Status: AC
  Administered 2016-09-13: 250 mg via INTRAMUSCULAR

## 2016-09-13 MED ORDER — AZITHROMYCIN 250 MG PO TABS
ORAL_TABLET | ORAL | Status: AC
  Filled 2016-09-13: qty 4

## 2016-09-13 MED ORDER — AZITHROMYCIN 250 MG PO TABS
1000.0000 mg | ORAL_TABLET | Freq: Once | ORAL | Status: AC
  Administered 2016-09-13: 1000 mg via ORAL

## 2016-09-13 NOTE — ED Notes (Signed)
Pt  Wants  A  Second  Opinion      Thinks  They  Did  Not  Him for  Std    As  They  Should  He   Stated

## 2016-09-13 NOTE — ED Provider Notes (Signed)
CSN: [id number]     Arrival date & time 09/13/16  1000 History   First MD Initiated Contact with Patient 09/13/16 1027     Chief Complaint  Patient presents with  . Abdominal Pain   (Consider location/radiation/quality/duration/timing/severity/associated sxs/prior Treatment) Patient c/o abdominal discomfort and diarrhea.  Patient c/o burning sensation when voids.    The history is provided by the patient.  Abdominal Pain  Pain location:  Generalized Pain quality: aching   Pain radiates to:  Does not radiate Pain severity:  Mild Onset quality:  Sudden Duration:  2 days Timing:  Constant Progression:  Waxing and waning Chronicity:  New Relieved by:  Nothing Worsened by:  Nothing Ineffective treatments:  None tried Associated symptoms: dysuria     Past Medical History:  Diagnosis Date  . Anxiety   . Arthritis   . 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 Elevated LFTs   . Foot and toe(s), blister    bilat feet, blistering between toes  . Glaucoma   . Headache(784.0)   . Hypertension    hx of  . UTI (lower urinary tract infection) 08/04/11   finishing abx tomorrow  . Weight loss    25 lb wt loss since 08/2010  . Wrist fracture, right    Past Surgical History:  Procedure Laterality Date  . ESOPHAGOGASTRODUODENOSCOPY    . EYE SURGERY Left   . EYE SURGERY Right    prostetic eye  . foot srugery     removal of bone near small toe  . FRACTURE SURGERY Right    wrist  . HARDWARE REMOVAL  08/06/2011   Procedure: HARDWARE REMOVAL;  Surgeon: Newt Minion, MD;  Location: [facility];  Service: Orthopedics;  Laterality: Right;  Removal Deep Retained Hardware Right Distal Radius  . MULTIPLE TOOTH EXTRACTIONS     for dentures   Family History  Problem Relation Age of Onset  . Diabetes Sister   . Diabetes Maternal Aunt     aunt and uncles  . Hypertension Sister     aunt and uncles maternal side  . Colon cancer Neg Hx    Social History  Substance Use Topics  . Smoking  status: Current Every Day Smoker    Packs/day: 1.00    Types: Cigarettes, Cigars  . Smokeless tobacco: Never Used     Comment: 1/2 - 1 cigar/day  . Alcohol use 3.5 oz/week    7 Standard drinks or equivalent per week    Review of Systems  Constitutional: Negative.   HENT: Negative.   Eyes: Negative.   Respiratory: Negative.   Cardiovascular: Negative.   Gastrointestinal: Positive for abdominal pain.  Endocrine: Negative.   Genitourinary: Positive for dysuria.  Musculoskeletal: Negative.   Allergic/Immunologic: Negative.   Neurological: Negative.   Hematological: Negative.   Psychiatric/Behavioral: Negative.     Allergies  Patient has no known allergies.  Home Medications   Prior to Admission medications   Medication Sig Start Date End Date Taking? Authorizing Provider  ibuprofen (ADVIL,MOTRIN) 200 MG tablet Take 400 mg by mouth daily as needed for moderate pain.     Historical Provider, MD  metroNIDAZOLE (FLAGYL) 500 MG tablet Take 1 tablet (500 mg total) by mouth 2 (two) times daily. 08/06/15   Sherlene Shams, MD  oxyCODONE-acetaminophen (PERCOCET/ROXICET) 5-325 MG tablet Take 1 tablet by mouth every 4 (four) hours as needed. 10/10/15   Larene Pickett, PA-C  phenazopyridine (PYRIDIUM) 200 MG tablet Take  1 tablet (200 mg total) by mouth 3 (three) times daily. 09/13/16   Lysbeth Penner, FNP  predniSONE (DELTASONE) 20 MG tablet Take 40 mg by mouth daily for 3 days, then 20mg  by mouth daily for 3 days, then 10mg  daily for 3 days 10/10/15   Larene Pickett, PA-C   Meds Ordered and Administered this Visit   Medications  cefTRIAXone (ROCEPHIN) injection 250 mg (not administered)  azithromycin (ZITHROMAX) tablet 1,000 mg (1,000 mg Oral Given 09/13/16 1104)    BP (!) 161/91 (BP Location: Left Arm)   Pulse 82   Temp 98.1 F (36.7 C) (Oral)   Resp 16   SpO2 98%  No data found.   Physical Exam  Constitutional: He is oriented to person, place, and time. He appears well-developed and  well-nourished.  HENT:  Head: Normocephalic and atraumatic.  Eyes: Conjunctivae and EOM are normal. Pupils are equal, round, and reactive to light.  Neck: Normal range of motion. Neck supple.  Cardiovascular: Normal rate, regular rhythm and normal heart sounds.   Pulmonary/Chest: Effort normal and breath sounds normal.  Abdominal: Soft. Bowel sounds are normal.  Genitourinary: Penis normal.  Musculoskeletal: Normal range of motion.  Neurological: He is alert and oriented to person, place, and time.  Nursing note and vitals reviewed.   Urgent Care Course     Procedures (including critical care time)  Labs Review Labs Reviewed  POCT URINALYSIS DIP (DEVICE) - Abnormal; Notable for the following:       Result Value   Ketones, ur TRACE (*)    Hgb urine dipstick MODERATE (*)    Urobilinogen, UA 2.0 (*)    All other components within normal limits  URINE CULTURE  URINE CYTOLOGY ANCILLARY ONLY    Imaging Review No results found.   Visual Acuity Review  Right Eye Distance:   Left Eye Distance:   Bilateral Distance:    Right Eye Near:   Left Eye Near:    Bilateral Near:         MDM   1. Urethritis   2. Possible exposure to STD   3. Other microscopic hematuria   UA cx  Pyridium 200mg  one po tid x 2d #6  Push po fluids, rest, tylenol and motrin otc prn as directed for fever, arthralgias, and myalgias.  Follow up prn if sx's continue or persist.    Lysbeth Penner, FNP 09/13/16 1110

## 2016-09-13 NOTE — Discharge Instructions (Signed)
Follow up with PCP for Hematuria.

## 2016-09-13 NOTE — ED Triage Notes (Signed)
Pt  Reports   Abdominal  Pain    With   Diarrhea   Also  Has  A  Burning  Sensation on the  Tip  Of  Penis      Seen  By  pcp  On  Friday    For  Std

## 2016-09-14 LAB — URINE CULTURE: Culture: NO GROWTH

## 2016-09-14 LAB — URINE CYTOLOGY ANCILLARY ONLY
Chlamydia: NEGATIVE
Neisseria Gonorrhea: NEGATIVE
Trichomonas: NEGATIVE

## 2017-03-10 ENCOUNTER — Encounter: Payer: Self-pay | Admitting: Gastroenterology

## 2017-03-22 ENCOUNTER — Ambulatory Visit (INDEPENDENT_AMBULATORY_CARE_PROVIDER_SITE_OTHER): Payer: Medicare Other

## 2017-03-22 ENCOUNTER — Ambulatory Visit (INDEPENDENT_AMBULATORY_CARE_PROVIDER_SITE_OTHER): Payer: Medicare Other | Admitting: Podiatry

## 2017-03-22 ENCOUNTER — Encounter: Payer: Self-pay | Admitting: Podiatry

## 2017-03-22 VITALS — BP 132/88 | HR 88

## 2017-03-22 DIAGNOSIS — M722 Plantar fascial fibromatosis: Secondary | ICD-10-CM

## 2017-03-22 DIAGNOSIS — M7742 Metatarsalgia, left foot: Secondary | ICD-10-CM

## 2017-03-22 DIAGNOSIS — Q828 Other specified congenital malformations of skin: Secondary | ICD-10-CM | POA: Diagnosis not present

## 2017-03-22 DIAGNOSIS — M7741 Metatarsalgia, right foot: Secondary | ICD-10-CM

## 2017-03-22 MED ORDER — METHYLPREDNISOLONE 4 MG PO TBPK: ORAL_TABLET | ORAL | 0 refills | Status: DC

## 2017-03-22 NOTE — Progress Notes (Signed)
He presents today on referral from for bilateral ankle pain and swelling bilateral heel pain he states tingling for over a year.  Objective: Vital signs are stable he is alert and oriented 3 pulses are strongly palpable. Has mild edema about the bilateral lower extremity pain on palpation posterior tibial tendon pes planus bilateral flexible nature with pain on palpation medial trochanter was bilateral. Radiographs do demonstrate soft tissue increase in density at the plantar fascia insertion site was swelling of the ankles. Some osteoarthritic changes in forefoot. Porokeratosis and plantar aspect of the bilateral foot greater than 4.  Assessment: Pes planus plantar fasciitis posterior tibial tendinitis. Multiple porokeratosis bilateral.  Plan: Discussed etiology and pathology conservative versus surgical therapies. Started him on a Medrol Dosepak injected the bilateral heels placed in compression anklets will follow up with him in 1 month. Also note that he needs to sit while at work. Debridement already hyperkeratosis bilateral.

## 2017-03-22 NOTE — Patient Instructions (Signed)

## 2017-04-21 ENCOUNTER — Ambulatory Visit (INDEPENDENT_AMBULATORY_CARE_PROVIDER_SITE_OTHER): Payer: Medicare Other | Admitting: Podiatry

## 2017-04-21 ENCOUNTER — Encounter: Payer: Self-pay | Admitting: Podiatry

## 2017-04-21 DIAGNOSIS — M722 Plantar fascial fibromatosis: Secondary | ICD-10-CM | POA: Diagnosis not present

## 2017-04-21 DIAGNOSIS — B351 Tinea unguium: Secondary | ICD-10-CM

## 2017-04-21 DIAGNOSIS — Q828 Other specified congenital malformations of skin: Secondary | ICD-10-CM | POA: Diagnosis not present

## 2017-04-21 DIAGNOSIS — M79676 Pain in unspecified toe(s): Secondary | ICD-10-CM

## 2017-04-21 NOTE — Progress Notes (Signed)
He presents today for follow-up of his plantar fasciitis and states that he is doing very well. He is also complaining of painful calluses bilateral and elongated toenails.  Objective: Vital signs are stable alert and oriented 3. Pulses are palpable. No pain on palpation medially continued tubercles bilateral multiple porokeratosis was noted to the plantar aspect of the forefoot. 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  Assessment: Pain on palpation of the nails with nail dystrophy. Also painful porokeratosis.  Plan: Debridement of all reactive hyperkeratotic tissue debridement of all nails tissue 1 through 5 bilateral.

## 2017-04-25 ENCOUNTER — Emergency Department (HOSPITAL_COMMUNITY): Payer: Medicare Other

## 2017-04-25 ENCOUNTER — Encounter (HOSPITAL_COMMUNITY): Payer: Self-pay

## 2017-04-25 ENCOUNTER — Emergency Department (HOSPITAL_COMMUNITY)
Admission: EM | Admit: 2017-04-25 | Discharge: 2017-04-25 | Disposition: A | Discharge status: Home / Self Care | Payer: Medicare Other | Attending: Emergency Medicine | Admitting: Emergency Medicine

## 2017-04-25 DIAGNOSIS — I10 Essential (primary) hypertension: Secondary | ICD-10-CM | POA: Insufficient documentation

## 2017-04-25 DIAGNOSIS — F17210 Nicotine dependence, cigarettes, uncomplicated: Secondary | ICD-10-CM | POA: Diagnosis not present

## 2017-04-25 DIAGNOSIS — Z79899 Other long term (current) drug therapy: Secondary | ICD-10-CM | POA: Insufficient documentation

## 2017-04-25 DIAGNOSIS — K2980 Duodenitis without bleeding: Secondary | ICD-10-CM

## 2017-04-25 DIAGNOSIS — R1013 Epigastric pain: Secondary | ICD-10-CM | POA: Diagnosis present

## 2017-04-25 HISTORY — DX: Gastro-esophageal reflux disease without esophagitis: K21.9

## 2017-04-25 HISTORY — DX: Alcohol abuse, uncomplicated: F10.10

## 2017-04-25 HISTORY — DX: Insomnia, unspecified: G47.00

## 2017-04-25 LAB — COMPREHENSIVE METABOLIC PANEL
ALT: 208 U/L — ABNORMAL HIGH (ref 17–63)
AST: 258 U/L — ABNORMAL HIGH (ref 15–41)
Albumin: 3.2 g/dL — ABNORMAL LOW (ref 3.5–5.0)
Alkaline Phosphatase: 219 U/L — ABNORMAL HIGH (ref 38–126)
Anion gap: 12 (ref 5–15)
BUN: 10 mg/dL (ref 6–20)
CO2: 27 mmol/L (ref 22–32)
Calcium: 8.8 mg/dL — ABNORMAL LOW (ref 8.9–10.3)
Chloride: 95 mmol/L — ABNORMAL LOW (ref 101–111)
Creatinine, Ser: 0.95 mg/dL (ref 0.61–1.24)
GFR calc Af Amer: >60 mL/min (ref >60)
GFR calc non Af Amer: >60 mL/min (ref >60)
Glucose, Bld: 102 mg/dL — ABNORMAL HIGH (ref 65–99)
Potassium: 4 mmol/L (ref 3.5–5.1)
Sodium: 134 mmol/L — ABNORMAL LOW (ref 135–145)
Total Bilirubin: 4 mg/dL — ABNORMAL HIGH (ref 0.3–1.2)
Total Protein: 6.3 g/dL — ABNORMAL LOW (ref 6.5–8.1)

## 2017-04-25 LAB — URINALYSIS, ROUTINE W REFLEX MICROSCOPIC
Glucose, UA: NEGATIVE mg/dL
Ketones, ur: NEGATIVE mg/dL
Nitrite: NEGATIVE
Protein, ur: 30 mg/dL — AB
RBC / HPF: NONE SEEN RBC/hpf (ref 0–5)
Specific Gravity, Urine: 1.028 (ref 1.005–1.030)
Squamous Epithelial / LPF: NONE SEEN
pH: 5 (ref 5.0–8.0)

## 2017-04-25 LAB — CBC
HCT: 40.2 % (ref 39.0–52.0)
Hemoglobin: 14.7 g/dL (ref 13.0–17.0)
MCH: 35 pg — ABNORMAL HIGH (ref 26.0–34.0)
MCHC: 36.6 g/dL — ABNORMAL HIGH (ref 30.0–36.0)
MCV: 95.7 fL (ref 78.0–100.0)
Platelets: 118 10*3/uL — ABNORMAL LOW (ref 150–400)
RBC: 4.2 MIL/uL — ABNORMAL LOW (ref 4.22–5.81)
RDW: 13.9 % (ref 11.5–15.5)
WBC: 7.1 10*3/uL (ref 4.0–10.5)

## 2017-04-25 LAB — LIPASE, BLOOD: Lipase: 252 U/L — ABNORMAL HIGH (ref 11–51)

## 2017-04-25 MED ORDER — FAMOTIDINE IN NACL 20-0.9 MG/50ML-% IV SOLN
20.0000 mg | Freq: Once | INTRAVENOUS | Status: AC
  Administered 2017-04-25: 20 mg via INTRAVENOUS
  Filled 2017-04-25: qty 50

## 2017-04-25 MED ORDER — OXYCODONE HCL 5 MG PO TABS: 5.0000 mg | ORAL_TABLET | ORAL | 0 refills | Status: DC | PRN

## 2017-04-25 MED ORDER — SUCRALFATE 1 G PO TABS: 1.0000 g | ORAL_TABLET | Freq: Three times a day (TID) | ORAL | 0 refills | Status: DC

## 2017-04-25 MED ORDER — SODIUM CHLORIDE 0.9 % IV SOLN
INTRAVENOUS | Status: DC
  Administered 2017-04-25: 12:00:00 via INTRAVENOUS

## 2017-04-25 MED ORDER — IOPAMIDOL (ISOVUE-300) INJECTION 61%
100.0000 mL | Freq: Once | INTRAVENOUS | Status: AC | PRN
  Administered 2017-04-25: 100 mL via INTRAVENOUS

## 2017-04-25 MED ORDER — HYDROMORPHONE HCL 1 MG/ML IJ SOLN
1.0000 mg | Freq: Once | INTRAMUSCULAR | Status: AC
  Administered 2017-04-25: 1 mg via INTRAVENOUS
  Filled 2017-04-25: qty 1

## 2017-04-25 MED ORDER — SODIUM CHLORIDE 0.9 % IV SOLN
80.0000 mg | Freq: Once | INTRAVENOUS | Status: AC
  Administered 2017-04-25: 80 mg via INTRAVENOUS
  Filled 2017-04-25: qty 80

## 2017-04-25 MED ORDER — PANTOPRAZOLE SODIUM 20 MG PO TBEC: 20.0000 mg | DELAYED_RELEASE_TABLET | Freq: Two times a day (BID) | ORAL | 0 refills | Status: DC

## 2017-04-25 MED ORDER — ONDANSETRON HCL 4 MG/2ML IJ SOLN
4.0000 mg | Freq: Once | INTRAMUSCULAR | Status: AC
  Administered 2017-04-25: 4 mg via INTRAVENOUS
  Filled 2017-04-25: qty 2

## 2017-04-25 MED ORDER — SUCRALFATE 1 G PO TABS
1.0000 g | ORAL_TABLET | Freq: Once | ORAL | Status: AC
  Administered 2017-04-25: 1 g via ORAL
  Filled 2017-04-25: qty 1

## 2017-04-25 MED ORDER — IOPAMIDOL (ISOVUE-300) INJECTION 61%
INTRAVENOUS | Status: AC
Start: 2017-04-25 — End: 2017-04-25
  Administered 2017-04-25: 100 mL via INTRAVENOUS
  Filled 2017-04-25: qty 100

## 2017-04-25 NOTE — ED Notes (Signed)
Patient stuck 2 times for IV access and blood work, both attempts unsuccessful.

## 2017-04-25 NOTE — ED Triage Notes (Signed)
Per EMS- Patient c/o epigastric pain that radiates into his mid back. Patient reported that he has been seeing his GI doctor and stated he had an inflamed appendix and a gallbladder issues. Patient also reports that he was suppose to go for scans this week,but pain was too bad. Patient is legally blind.

## 2017-04-25 NOTE — ED Notes (Signed)
Made Dr Wilson Singer aware that patient wanting to know plan of care.  Dr will be going to speak with patient.

## 2017-04-25 NOTE — ED Provider Notes (Signed)
[facility] DEPT Provider Note   CSN: [id number] Arrival date & time: 04/25/17  3419     History   Chief Complaint Chief Complaint  Patient presents with  . Abdominal Pain    epigastric  . Nausea  . Fever    HPI Austin [name] is a 51 y.o. male.  HPI   51 year old male with abdominal pain.  Intermittent for the past several months but in the past several days but is constant.  Epigastric to right upper quadrant.  Radiates into his back.  Worse with certain movements.  Associated nausea.  No vomiting.  Subjective fever.  Patient has a past history of alcohol abuse and continues to drink daily although "only a margarita a day."  Denies any past abdominal surgical history.  Past Medical History:  Diagnosis Date  . Alcohol abuse   . Anxiety   . Arthritis   . 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 Elevated LFTs   . Foot and toe(s), blister    bilat feet, blistering between toes  . GERD (gastroesophageal reflux disease)   . Glaucoma   . Headache(784.0)   . Hypertension    hx of  . Insomnia   . UTI (lower urinary tract infection) 08/04/11   finishing abx tomorrow  . Weight loss    25 lb wt loss since 08/2010  . Wrist fracture, right     Patient Active Problem List   Diagnosis Date Noted  . 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 ESOPHAGOGASTRODUODENOSCOPY    . EYE SURGERY Left   . EYE SURGERY Right    prostetic eye  . foot srugery     removal of bone near small toe  . FRACTURE SURGERY Right    wrist  . HARDWARE REMOVAL Right 08/06/2011   Performed by Newt Minion, MD at [facility]  . MULTIPLE TOOTH EXTRACTIONS     for dentures       Home Medications    Prior to Admission medications   Medication Sig Start Date End Date Taking? Authorizing Provider  amLODipine (NORVASC) 5 MG tablet Take 5 mg daily by mouth. 04/05/17  Yes [provider]  cetirizine (ZYRTEC) 10 MG tablet Take 10 mg daily by mouth. 04/05/17  Yes [provider]  HYDROcodone-acetaminophen (NORCO/VICODIN) 5-325 MG tablet Take 1 tablet every 4 (four) hours as needed by mouth for pain. 04/13/17  Yes [provider]  ibuprofen (ADVIL,MOTRIN) 800 MG tablet Take 800 mg every 8 (eight) hours as needed by mouth for moderate pain.   Yes [provider]  ipratropium (ATROVENT) 0.03 % nasal spray Place 2 sprays 2 (two) times daily into both nostrils. 04/05/17  Yes [provider]  mirtazapine (REMERON) 15 MG tablet Take 15 mg by mouth at bedtime. 03/02/17  Yes [provider]  omeprazole (PRILOSEC) 40 MG capsule Take 40 mg daily by mouth.  03/02/17  Yes [provider]  oxybutynin (DITROPAN) 5 MG tablet Take 5 mg 2 (two) times daily by mouth. 04/18/17  Yes [provider]  methylPREDNISolone (MEDROL DOSEPAK) 4 MG TBPK tablet 6 day dose pack - take as directed Patient not taking: Reported on 04/25/2017 03/22/17   Garrel Ridgel, DPM    Family History Family History  Problem Relation Age of Onset  . Diabetes Sister   . Diabetes Maternal Aunt  aunt and uncles  . Hypertension Sister        aunt and uncles maternal side  . Colon cancer Neg Hx     Social History Social History   Tobacco Use  . Smoking status: Current Every Day Smoker    Packs/day: 1.00    Types: Cigarettes, Cigars  . Smokeless tobacco: Never Used  . Tobacco comment: 1/2 - 1 cigar/day  Substance Use Topics  . Alcohol use: Yes    Alcohol/week: 3.5 oz    Types: 7 Standard drinks or equivalent per week  . Drug use: Yes    Types: Marijuana    Comment: smokes marijuana 1 to 2 times daily     Allergies   Patient has no known allergies.   Review of Systems Review of Systems  All systems reviewed and negative, other than as noted in HPI.  Physical Exam Updated Vital Signs BP 134/88 (BP Location: Left Arm)   Pulse 87   Temp  99.1 F (37.3 C) (Rectal)   Resp 16   Ht 5\' 7"  (1.702 m)   Wt 68.9 kg (152 lb)   SpO2 98%   BMI 23.81 kg/m    Physical Exam  Constitutional: He appears well-developed and well-nourished. No distress.  HENT:  Head: Normocephalic and atraumatic.  Eyes: Conjunctivae are normal. Right eye exhibits no discharge. Left eye exhibits no discharge.  Blind  Neck: Neck supple.  Cardiovascular: Normal rate, regular rhythm and normal heart sounds. Exam reveals no gallop and no friction rub.  No murmur heard. Pulmonary/Chest: Effort normal and breath sounds normal. No respiratory distress.  Abdominal: Soft. He exhibits no distension. There is tenderness.  Epigastric and right upper quadrant tenderness without rebound or guarding.  Reducible umbilical hernia.  Musculoskeletal: He exhibits no edema or tenderness.  Neurological: He is alert.  Skin: Skin is warm and dry.  Psychiatric: He has a normal mood and affect. His behavior is normal. Thought content normal.  Nursing note and vitals reviewed.    ED Treatments / Results  Labs (all labs ordered are listed, but only abnormal results are displayed) Labs Reviewed  LIPASE, BLOOD - Abnormal; Notable for the following components:      Result Value   Lipase 252 (*)    All other components within normal limits  COMPREHENSIVE METABOLIC PANEL - Abnormal; Notable for the following components:   Sodium 134 (*)    Chloride 95 (*)    Glucose, Bld 102 (*)    Calcium 8.8 (*)    Total Protein 6.3 (*)    Albumin 3.2 (*)    AST 258 (*)    ALT 208 (*)    Alkaline Phosphatase 219 (*)    Total Bilirubin 4.0 (*)    All other components within normal limits  CBC - Abnormal; Notable for the following components:   RBC 4.20 (*)    MCH 35.0 (*)    MCHC 36.6 (*)    Platelets 118 (*)    All other components within normal limits  URINALYSIS, ROUTINE W REFLEX MICROSCOPIC - Abnormal; Notable for the following components:   Color, Urine AMBER (*)    Hgb  urine dipstick SMALL (*)    Bilirubin Urine MODERATE (*)    Protein, ur 30 (*)    Leukocytes, UA TRACE (*)    Bacteria, UA FEW (*)    All other components within normal limits  HEPATITIS PANEL, ACUTE    EKG  EKG Interpretation None  Radiology Ct Abdomen Pelvis W Contrast  Result Date: 04/25/2017 CLINICAL DATA:  Umbilical pain, ruq and rlq pain since Fri 04/22/17; nausea; hematuria; low grade fever; wt fluctuating 4# loss since prev MD visit; no recent surg EXAM: CT ABDOMEN AND PELVIS WITH CONTRAST TECHNIQUE: Multidetector CT imaging of the abdomen and pelvis was performed using the standard protocol following bolus administration of intravenous contrast. CONTRAST:  100 mL of Isovue-300 intravenous contrast COMPARISON:  03/10/2017 FINDINGS: Lower chest: Clear lung bases.  Heart is normal in size. Hepatobiliary: Liver shows decreased attenuation diffusely consistent with fatty infiltration. No liver masses or focal lesions. Normal gallbladder. No bile duct dilation. Pancreas: Unremarkable. No pancreatic ductal dilatation or surrounding inflammatory changes. Spleen: Normal in size without focal abnormality. Adrenals/Urinary Tract: No adrenal masses. Small nonobstructing stone in the upper pole the left kidney. No other intrarenal stones. No renal masses. Symmetric renal enhancement and excretion. Normal ureters. Bladder is mostly decompressed but otherwise unremarkable. Stomach/Bowel: Stomach is unremarkable. There is subtle stranding in the fat adjacent to the duodenum, primarily the bulb and second portion. This is suspicious for duodenitis in the proper clinical setting. Small bowel is unremarkable. No colonic wall thickening or inflammation. Appendix is not visualized. Vascular/Lymphatic: Aortic atherosclerosis. No enlarged abdominal or pelvic lymph nodes. Reproductive: Mild prostatic enlargement similar to the prior study. Other: Stable prominence of the umbilicus. No significant abdominal  wall hernia or inflammation. Trace ascites collects in the posterior pelvic recess. Musculoskeletal: No acute or significant osseous findings. IMPRESSION: 1. Mild inflammatory type changes noted adjacent to the duodenum suspicious for duodenitis if this correlates clinically. No CT evidence of an ulcer. Trace ascites noted in the pelvis. 2. No other evidence of an acute abnormality within the abdomen or pelvis. 3. Hepatic steatosis. 4. Aortic atherosclerosis. Electronically Signed   By: Lajean Manes M.D.   On: 04/25/2017 14:33   US Abdomen Limited  Result Date: 04/25/2017 CLINICAL DATA:  Four days of right upper quadrant pain and fever. History of alcohol abuse. EXAM: ULTRASOUND ABDOMEN LIMITED RIGHT UPPER QUADRANT COMPARISON:  Noncontrast abdominal CT scan of March 10, 2017 FINDINGS: Gallbladder: No gallstones or wall thickening visualized. No sonographic Murphy sign noted by sonographer. Common bile duct: Diameter: 3.4 mm where visualized Liver: The hepatic echotexture is heterogeneous. There is no focal mass or ductal dilation. The surface contour remains smooth. Portal vein is patent on color Doppler imaging with normal direction of blood flow towards the liver. IMPRESSION: Heterogeneous hepatic echotexture which may reflect early changes of fatty infiltration or other hepatocellular disease. No surface contour nodularity or evidence of hepatic masses or intrahepatic ductal dilation. Normal appearance of the gallbladder and common bile duct. Electronically Signed   By: David  Martinique M.D.   On: 04/25/2017 12:21    Procedures Procedures (including critical care time)  Medications Ordered in ED Medications  0.9 %  sodium chloride infusion ( Intravenous New Bag/Given 04/25/17 1132)  famotidine (PEPCID) IVPB 20 mg premix (20 mg Intravenous New Bag/Given 04/25/17 1517)  pantoprazole (PROTONIX) 80 mg in sodium chloride 0.9 % 100 mL IVPB (not administered)  HYDROmorphone (DILAUDID) injection 1 mg (1 mg  Intravenous Given 04/25/17 1134)  ondansetron (ZOFRAN) injection 4 mg (4 mg Intravenous Given 04/25/17 1134)  iopamidol (ISOVUE-300) 61 % injection 100 mL (100 mLs Intravenous Contrast Given 04/25/17 1404)     Initial Impression / Assessment and Plan / ED Course  I have reviewed the triage vital signs and the nursing notes.  Pertinent labs & imaging  results that were available during my care of the patient were reviewed by me and considered in my medical decision making (see chart for details).     51 year old male with abdominal pain.  Epigastric to right upper quadrant.  Abnormal LFTs.  Ultrasound is negative for stones.  No mention of biliary dilatation on ultrasound or CT imaging.  He reports subjective fever but is afebrile in the emergency room.  No leukocytosis. I doubt cholangitis.  CT with evidence of inflammation around the duodenum.  Mild elevation of lipase may be reflective of this. No discrete pancreatic inflammation noted.   Currently feeling much better. Pt advised to stop drinking ETOH. PPI and carafate. Avoid NSAIDs.   Final Clinical Impressions(s) / ED Diagnoses   Final diagnoses:  Duodenitis    ED Discharge Orders    None      Virgel Manifold, MD 04/27/17 [phone]

## 2017-04-25 NOTE — ED Notes (Signed)
Pt has been informed about UA. Urinal at bedside

## 2017-04-25 NOTE — Discharge Instructions (Signed)
You likely have an ulcer of your duodenum (first part of your intestine after it leaves your stomach). You need to stop drinking alcohol completely. Take prescribed medication. Stop taking NSAIDs (ibuprofen, aleve, naprosyn, goody powder, etc). Take prescribed pain medication as needed.

## 2017-04-26 LAB — HEPATITIS PANEL, ACUTE
HCV Ab: <0.1 s/co ratio (ref 0.0–0.9)
Hep A IgM: NEGATIVE
Hep B C IgM: NEGATIVE
Hepatitis B Surface Ag: NEGATIVE

## 2017-05-06 ENCOUNTER — Ambulatory Visit (INDEPENDENT_AMBULATORY_CARE_PROVIDER_SITE_OTHER): Payer: Medicare Other | Admitting: Gastroenterology

## 2017-05-06 ENCOUNTER — Encounter: Payer: Self-pay | Admitting: Gastroenterology

## 2017-05-06 VITALS — BP 124/80 | HR 74 | Ht 67.0 in | Wt 147.5 lb

## 2017-05-06 DIAGNOSIS — R935 Abnormal findings on diagnostic imaging of other abdominal regions, including retroperitoneum: Secondary | ICD-10-CM | POA: Diagnosis not present

## 2017-05-06 DIAGNOSIS — R1011 Right upper quadrant pain: Secondary | ICD-10-CM | POA: Diagnosis not present

## 2017-05-06 DIAGNOSIS — R945 Abnormal results of liver function studies: Secondary | ICD-10-CM

## 2017-05-06 DIAGNOSIS — R7989 Other specified abnormal findings of blood chemistry: Secondary | ICD-10-CM

## 2017-05-06 DIAGNOSIS — F1010 Alcohol abuse, uncomplicated: Secondary | ICD-10-CM | POA: Diagnosis not present

## 2017-05-06 NOTE — H&P (View-Only) (Signed)
[facility] Consult Note:  History: Austin [name] 05/06/2017  Referring physician: Care, Jinny Blossom Total Access  Reason for consult/chief complaint: Abdominal Pain (right side); Gas; and Weight Loss   Subjective  HPI:  This is a 51 year old man originally referred to [place] in late September for colon cancer screening.  However, he was more recently referred again for dysphagia, abdominal pain and alcohol abuse.  This patient seems to recall having had a prior colonoscopy, but can give no details about that, and is certainly not have one at this clinic. His complaint is really several months of frequent epigastric to right upper quadrant abdominal pain that is fairly constant and has brought him to the ED on at least one occasion.  A review of that encounter from 04/25/2017 was thoroughly reviewed including provider note, labs and imaging.  He cannot recall how this pain started, but he was certainly taking ibuprofen 800 mg regularly.  He believes he was started on that because of the abdominal pain.  He was told to stop that at the recent emergency department visit, and was given a small supply of oxycodone.  Austin [name] reports that that is insufficiently controlling the pain. His appetite has been decreased somewhat lately, and he thinks he may have lost a few pounds.  He denies nausea or vomiting or dysphagia.  He admits that he has been a heavy alcohol user for years, but has cut back considerably in the last month.  He says he had "swallow" of some alcohol yesterday. He denies diarrhea or rectal bleeding.  ROS:  Review of Systems  Constitutional: Negative for appetite change and unexpected weight change.  HENT: Negative for mouth sores and voice change.   Eyes: Positive for visual disturbance. Negative for pain and redness.  Respiratory: Negative for cough and shortness of breath.   Cardiovascular: Negative for chest pain and palpitations.  Genitourinary: Positive  for frequency. Negative for dysuria and hematuria.  Musculoskeletal: Negative for arthralgias and myalgias.  Skin: Negative for pallor and rash.  Neurological: Negative for weakness and headaches.  Hematological: Negative for adenopathy.   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  He reports that he sees quite poorly out of the right eye.  Past Medical History: Past Medical History:  Diagnosis Date  . Alcohol abuse   . Anxiety   . Arthritis   . 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 Elevated LFTs   . Foot and toe(s), blister    bilat feet, blistering between toes  . GERD (gastroesophageal reflux disease)   . Glaucoma   . Headache(784.0)   . Hypertension    hx of  . Insomnia   . UTI (lower urinary tract infection) 08/04/11   finishing abx tomorrow  . Weight loss    25 lb wt loss since 08/2010  . Wrist fracture, right      Past Surgical History: Past Surgical History:  Procedure Laterality Date  . ESOPHAGOGASTRODUODENOSCOPY    . EYE SURGERY Left   . EYE SURGERY Right    prostetic eye  . foot srugery     removal of bone near small toe  . FRACTURE SURGERY Right    wrist  . HARDWARE REMOVAL  08/06/2011   Procedure: HARDWARE REMOVAL;  Surgeon: Newt Minion, MD;  Location: [place];  Service: Orthopedics;  Laterality: Right;  Removal Deep Retained Hardware Right Distal  Radius  . MULTIPLE TOOTH EXTRACTIONS     for dentures     Family History: Family History  Problem Relation Age of Onset  . Diabetes Sister   . Diabetes Maternal Aunt        aunt and uncles  . Hypertension Sister        aunt and uncles maternal side  . Colon cancer Neg Hx     Social History: Social History   Socioeconomic History  . Marital status: Single    Spouse name: None  . Number of children: 1  . Years of education: None  . Highest education level: None  Social Needs  . Financial resource strain: None  . Food insecurity  - worry: None  . Food insecurity - inability: None  . Transportation needs - medical: None  . Transportation needs - non-medical: None  Occupational History  . Occupation: disabled  Tobacco Use  . Smoking status: Current Every Day Smoker    Packs/day: 1.00    Types: Cigarettes, Cigars  . Smokeless tobacco: Never Used  . Tobacco comment: 1/2 - 1 cigar/day  Substance and Sexual Activity  . Alcohol use: Yes    Alcohol/week: 3.5 oz    Types: 7 Standard drinks or equivalent per week  . Drug use: Yes    Types: Marijuana    Comment: smokes marijuana 1 to 2 times daily  . Sexual activity: None  Other Topics Concern  . None  Social History Narrative  . None    Allergies: No Known Allergies  Outpatient Meds: Current Outpatient Medications  Medication Sig Dispense Refill  . amLODipine (NORVASC) 5 MG tablet Take 5 mg daily by mouth.  2  . cetirizine (ZYRTEC) 10 MG tablet Take 10 mg daily by mouth.  2  . HYDROcodone-acetaminophen (NORCO/VICODIN) 5-325 MG tablet Take 1 tablet every 4 (four) hours as needed by mouth for pain.  0  . ibuprofen (ADVIL,MOTRIN) 800 MG tablet Take 800 mg every 8 (eight) hours as needed by mouth for moderate pain.    Marland Kitchen ipratropium (ATROVENT) 0.03 % nasal spray Place 2 sprays 2 (two) times daily into both nostrils.  2  . mirtazapine (REMERON) 15 MG tablet Take 15 mg by mouth at bedtime.  1  . oxybutynin (DITROPAN) 5 MG tablet Take 5 mg 2 (two) times daily by mouth.  11  . oxyCODONE (ROXICODONE) 5 MG immediate release tablet Take 1 tablet (5 mg total) every 4 (four) hours as needed by mouth for severe pain. 15 tablet 0  . pantoprazole (PROTONIX) 20 MG tablet Take 1 tablet (20 mg total) 2 (two) times daily before a meal by mouth. 60 tablet 0  . sucralfate (CARAFATE) 1 g tablet Take 1 tablet (1 g total) 4 (four) times daily -  with meals and at bedtime by mouth. 30 tablet 0   No current facility-administered medications for this visit.        ___________________________________________________________________ Objective   Exam:  BP 124/80   Pulse 74   Ht 5\' 7"  (1.702 m)   Wt 147 lb 8 oz (66.9 kg)   BMI 23.10 kg/m    General: this is a(n) chronically ill-appearing man with a prosthetic left eye and some postsurgical change of the right eye, making it difficult to examine.  Difficult to determine if there is icterus.  ENT: oral mucosa moist without lesions, no cervical or supraclavicular lymphadenopathy, poor dentition  CV: RRR without murmur, S1/S2, no JVD, no peripheral edema  Resp: clear  to auscultation bilaterally, normal RR and effort noted  GI: soft, epigastric and right upper quadrant tenderness, with active bowel sounds. No guarding or palpable organomegaly noted.  Not distended or tympanitic  Skin; warm and dry, no rash or jaundice noted  Neuro: awake, alert and oriented x 3. Normal gross motor function and fluent speech  Labs:  CMP Latest Ref Rng & Units 04/25/2017 08/15/2014 04/03/2013  Glucose 65 - 99 mg/dL 102(H) 136(H) 96  BUN 6 - 20 mg/dL 10 <5(L) 9  Creatinine 0.61 - 1.24 mg/dL 0.95 1.11 1.0  Sodium 135 - 145 mmol/L 134(L) 134(L) 136  Potassium 3.5 - 5.1 mmol/L 4.0 4.1 4.1  Chloride 101 - 111 mmol/L 95(L) 97 101  CO2 22 - 32 mmol/L 27 26 26   Calcium 8.9 - 10.3 mg/dL 8.8(L) 9.1 9.1  Total Protein 6.5 - 8.1 g/dL 6.3(L) 6.4 7.3  Total Bilirubin 0.3 - 1.2 mg/dL 4.0(H) 1.0 0.6  Alkaline Phos 38 - 126 U/L 219(H) 140(H) 107  AST 15 - 41 U/L 258(H) 328(H) 68(H)  ALT 17 - 63 U/L 208(H) 152(H) 45   CBC Latest Ref Rng & Units 04/25/2017 08/15/2014 04/03/2013  WBC 4.0 - 10.5 K/uL 7.1 2.5(L) 5.9  Hemoglobin 13.0 - 17.0 g/dL 14.7 14.2 14.4  Hematocrit 39.0 - 52.0 % 40.2 40.0 42.9  Platelets 150 - 400 K/uL 118(L) 91(L) 146.0(L)     Radiologic Studies:  Right upper quadrant ultrasound shows coarsened echotexture of the liver but with a smooth edge.  No gallstones, normal caliber CBD  CT scan  abdomen pelvis with contrast from 04/15/2017 was personally reviewed.  There is no biliary ductal dilatation, no liver lesion and no bowel obstruction.  There is apparent inflammation around the duodenum.  No free air is seen.  Assessment: Encounter Diagnoses  Name Primary?  . RUQ pain Yes  . Abnormal CT of the abdomen   . LFT elevation   . Alcohol abuse     I suspect he has a duodenal ulcer from NSAID use, unknown H. pylori status.  I reviewed an upper endoscopy report from November 2014 by Dr. Sharlett Iles.  A biopsy of the stomach was reportedly taken to check H. pylori status, but I cannot find the results of that biopsy and discharge.  Plan:  No aspirin or NSAIDs I reviewed his meds with him and his fiance because it seems they have difficulty keeping track of them, he does not seem to know what some of them are for.  They both have visual impairment, and it is not clear if he is taking all of his meds as directed or at all. We have tried to make sure he is taking his pantoprazole twice daily. Upper endoscopy next week  The benefits and risks of the planned procedure were described in detail with the patient or (when appropriate) their health care proxy.  Risks were outlined as including, but not limited to, bleeding, infection, perforation, adverse medication reaction leading to cardiac or pulmonary decompensation, or pancreatitis (if ERCP).  The limitation of incomplete mucosal visualization was also discussed.  No guarantees or warranties were given.  He requested more opioid pain medicine, which I declined.  We will address a possible routine colonoscopy at a later date.  Thank you for the courtesy of this consult.  Please call me with any questions or concerns.  Nelida Meuse III  CC: Care, Jinny Blossom Total Access Dr. Lillia Corporal

## 2017-05-06 NOTE — Patient Instructions (Signed)
If you are age 51 or older, your body mass index should be between 23-30. Your Body mass index is 23.1 kg/m. If this is out of the aforementioned range listed, please consider follow up with your Primary Care Provider.  If you are age 84 or younger, your body mass index should be between 19-25. Your Body mass index is 23.1 kg/m. If this is out of the aformentioned range listed, please consider follow up with your Primary Care Provider.   You have been scheduled for an endoscopy. Please follow written instructions given to you at your visit today. If you use inhalers (even only as needed), please bring them with you on the day of your procedure. Your physician has requested that you go to www.startemmi.com and enter the access code given to you at your visit today. This web site gives a general overview about your procedure. However, you should still follow specific instructions given to you by our office regarding your preparation for the procedure.  Thank you for choosing [facility]  Dr Wilfrid Lund III

## 2017-05-06 NOTE — Progress Notes (Signed)
[facility] Consult Note:  History: Austin [name] 05/06/2017  Referring physician: Care, Jinny Blossom Total Access  Reason for consult/chief complaint: Abdominal Pain (right side); Gas; and Weight Loss   Subjective  HPI:  This is a 51 year old man originally referred to [place] in late September for colon cancer screening.  However, he was more recently referred again for dysphagia, abdominal pain and alcohol abuse.  This patient seems to recall having had a prior colonoscopy, but can give no details about that, and is certainly not have one at this clinic. His complaint is really several months of frequent epigastric to right upper quadrant abdominal pain that is fairly constant and has brought him to the ED on at least one occasion.  A review of that encounter from 04/25/2017 was thoroughly reviewed including provider note, labs and imaging.  He cannot recall how this pain started, but he was certainly taking ibuprofen 800 mg regularly.  He believes he was started on that because of the abdominal pain.  He was told to stop that at the recent emergency department visit, and was given a small supply of oxycodone.  [name] reports that that is insufficiently controlling the pain. His appetite has been decreased somewhat lately, and he thinks he may have lost a few pounds.  He denies nausea or vomiting or dysphagia.  He admits that he has been a heavy alcohol user for years, but has cut back considerably in the last month.  He says he had "swallow" of some alcohol yesterday. He denies diarrhea or rectal bleeding.  ROS:  Review of Systems  Constitutional: Negative for appetite change and unexpected weight change.  HENT: Negative for mouth sores and voice change.   Eyes: Positive for visual disturbance. Negative for pain and redness.  Respiratory: Negative for cough and shortness of breath.   Cardiovascular: Negative for chest pain and palpitations.  Genitourinary: Positive  for frequency. Negative for dysuria and hematuria.  Musculoskeletal: Negative for arthralgias and myalgias.  Skin: Negative for pallor and rash.  Neurological: Negative for weakness and headaches.  Hematological: Negative for adenopathy.   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  He reports that he sees quite poorly out of the right eye.  Past Medical History: Past Medical History:  Diagnosis Date  . Alcohol abuse   . Anxiety   . Arthritis   . 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 Elevated LFTs   . Foot and toe(s), blister    bilat feet, blistering between toes  . GERD (gastroesophageal reflux disease)   . Glaucoma   . Headache(784.0)   . Hypertension    hx of  . Insomnia   . UTI (lower urinary tract infection) 08/04/11   finishing abx tomorrow  . Weight loss    25 lb wt loss since 08/2010  . Wrist fracture, right      Past Surgical History: Past Surgical History:  Procedure Laterality Date  . ESOPHAGOGASTRODUODENOSCOPY    . EYE SURGERY Left   . EYE SURGERY Right    prostetic eye  . foot srugery     removal of bone near small toe  . FRACTURE SURGERY Right    wrist  . HARDWARE REMOVAL  08/06/2011   Procedure: HARDWARE REMOVAL;  Surgeon: Newt Minion, MD;  Location: [facility];  Service: Orthopedics;  Laterality: Right;  Removal Deep Retained Hardware Right Distal  Radius  . MULTIPLE TOOTH EXTRACTIONS     for dentures     Family History: Family History  Problem Relation Age of Onset  . Diabetes Sister   . Diabetes Maternal Aunt        aunt and uncles  . Hypertension Sister        aunt and uncles maternal side  . Colon cancer Neg Hx     Social History: Social History   Socioeconomic History  . Marital status: Single    Spouse name: None  . Number of children: 1  . Years of education: None  . Highest education level: None  Social Needs  . Financial resource strain: None  . Food insecurity  - worry: None  . Food insecurity - inability: None  . Transportation needs - medical: None  . Transportation needs - non-medical: None  Occupational History  . Occupation: disabled  Tobacco Use  . Smoking status: Current Every Day Smoker    Packs/day: 1.00    Types: Cigarettes, Cigars  . Smokeless tobacco: Never Used  . Tobacco comment: 1/2 - 1 cigar/day  Substance and Sexual Activity  . Alcohol use: Yes    Alcohol/week: 3.5 oz    Types: 7 Standard drinks or equivalent per week  . Drug use: Yes    Types: Marijuana    Comment: smokes marijuana 1 to 2 times daily  . Sexual activity: None  Other Topics Concern  . None  Social History Narrative  . None    Allergies: No Known Allergies  Outpatient Meds: Current Outpatient Medications  Medication Sig Dispense Refill  . amLODipine (NORVASC) 5 MG tablet Take 5 mg daily by mouth.  2  . cetirizine (ZYRTEC) 10 MG tablet Take 10 mg daily by mouth.  2  . HYDROcodone-acetaminophen (NORCO/VICODIN) 5-325 MG tablet Take 1 tablet every 4 (four) hours as needed by mouth for pain.  0  . ibuprofen (ADVIL,MOTRIN) 800 MG tablet Take 800 mg every 8 (eight) hours as needed by mouth for moderate pain.    Marland Kitchen ipratropium (ATROVENT) 0.03 % nasal spray Place 2 sprays 2 (two) times daily into both nostrils.  2  . mirtazapine (REMERON) 15 MG tablet Take 15 mg by mouth at bedtime.  1  . oxybutynin (DITROPAN) 5 MG tablet Take 5 mg 2 (two) times daily by mouth.  11  . oxyCODONE (ROXICODONE) 5 MG immediate release tablet Take 1 tablet (5 mg total) every 4 (four) hours as needed by mouth for severe pain. 15 tablet 0  . pantoprazole (PROTONIX) 20 MG tablet Take 1 tablet (20 mg total) 2 (two) times daily before a meal by mouth. 60 tablet 0  . sucralfate (CARAFATE) 1 g tablet Take 1 tablet (1 g total) 4 (four) times daily -  with meals and at bedtime by mouth. 30 tablet 0   No current facility-administered medications for this visit.        ___________________________________________________________________ Objective   Exam:  BP 124/80   Pulse 74   Ht 5\' 7"  (1.702 m)   Wt 147 lb 8 oz (66.9 kg)   BMI 23.10 kg/m    General: this is a(n) chronically ill-appearing man with a prosthetic left eye and some postsurgical change of the right eye, making it difficult to examine.  Difficult to determine if there is icterus.  ENT: oral mucosa moist without lesions, no cervical or supraclavicular lymphadenopathy, poor dentition  CV: RRR without murmur, S1/S2, no JVD, no peripheral edema  Resp: clear  to auscultation bilaterally, normal RR and effort noted  GI: soft, epigastric and right upper quadrant tenderness, with active bowel sounds. No guarding or palpable organomegaly noted.  Not distended or tympanitic  Skin; warm and dry, no rash or jaundice noted  Neuro: awake, alert and oriented x 3. Normal gross motor function and fluent speech  Labs:  CMP Latest Ref Rng & Units 04/25/2017 08/15/2014 04/03/2013  Glucose 65 - 99 mg/dL 102(H) 136(H) 96  BUN 6 - 20 mg/dL 10 <5(L) 9  Creatinine 0.61 - 1.24 mg/dL 0.95 1.11 1.0  Sodium 135 - 145 mmol/L 134(L) 134(L) 136  Potassium 3.5 - 5.1 mmol/L 4.0 4.1 4.1  Chloride 101 - 111 mmol/L 95(L) 97 101  CO2 22 - 32 mmol/L 27 26 26   Calcium 8.9 - 10.3 mg/dL 8.8(L) 9.1 9.1  Total Protein 6.5 - 8.1 g/dL 6.3(L) 6.4 7.3  Total Bilirubin 0.3 - 1.2 mg/dL 4.0(H) 1.0 0.6  Alkaline Phos 38 - 126 U/L 219(H) 140(H) 107  AST 15 - 41 U/L 258(H) 328(H) 68(H)  ALT 17 - 63 U/L 208(H) 152(H) 45   CBC Latest Ref Rng & Units 04/25/2017 08/15/2014 04/03/2013  WBC 4.0 - 10.5 K/uL 7.1 2.5(L) 5.9  Hemoglobin 13.0 - 17.0 g/dL 14.7 14.2 14.4  Hematocrit 39.0 - 52.0 % 40.2 40.0 42.9  Platelets 150 - 400 K/uL 118(L) 91(L) 146.0(L)     Radiologic Studies:  Right upper quadrant ultrasound shows coarsened echotexture of the liver but with a smooth edge.  No gallstones, normal caliber CBD  CT scan  abdomen pelvis with contrast from 04/15/2017 was personally reviewed.  There is no biliary ductal dilatation, no liver lesion and no bowel obstruction.  There is apparent inflammation around the duodenum.  No free air is seen.  Assessment: Encounter Diagnoses  Name Primary?  . RUQ pain Yes  . Abnormal CT of the abdomen   . LFT elevation   . Alcohol abuse     I suspect he has a duodenal ulcer from NSAID use, unknown H. pylori status.  I reviewed an upper endoscopy report from November 2014 by Dr. Sharlett Iles.  A biopsy of the stomach was reportedly taken to check H. pylori status, but I cannot find the results of that biopsy and discharge.  Plan:  No aspirin or NSAIDs I reviewed his meds with him and his fiance because it seems they have difficulty keeping track of them, he does not seem to know what some of them are for.  They both have visual impairment, and it is not clear if he is taking all of his meds as directed or at all. We have tried to make sure he is taking his pantoprazole twice daily. Upper endoscopy next week  The benefits and risks of the planned procedure were described in detail with the patient or (when appropriate) their health care proxy.  Risks were outlined as including, but not limited to, bleeding, infection, perforation, adverse medication reaction leading to cardiac or pulmonary decompensation, or pancreatitis (if ERCP).  The limitation of incomplete mucosal visualization was also discussed.  No guarantees or warranties were given.  He requested more opioid pain medicine, which I declined.  We will address a possible routine colonoscopy at a later date.  Thank you for the courtesy of this consult.  Please call me with any questions or concerns.  Nelida Meuse III  CC: Care, Jinny Blossom Total Access Dr. Lillia Corporal

## 2017-05-09 ENCOUNTER — Encounter (HOSPITAL_COMMUNITY): Payer: Self-pay | Admitting: Anesthesiology

## 2017-05-09 ENCOUNTER — Ambulatory Visit (HOSPITAL_COMMUNITY): Payer: Medicare Other | Admitting: Anesthesiology

## 2017-05-09 ENCOUNTER — Other Ambulatory Visit: Payer: Self-pay

## 2017-05-09 ENCOUNTER — Encounter (HOSPITAL_COMMUNITY)
Admission: RE | Disposition: A | Discharge status: Home / Self Care | Payer: Self-pay | Source: Ambulatory Visit | Attending: Gastroenterology

## 2017-05-09 ENCOUNTER — Ambulatory Visit (HOSPITAL_COMMUNITY)
Admission: RE | Admit: 2017-05-09 | Discharge: 2017-05-09 | Disposition: A | Discharge status: Home / Self Care | Payer: Medicare Other | Source: Ambulatory Visit | Attending: Gastroenterology | Admitting: Gastroenterology

## 2017-05-09 DIAGNOSIS — Z6823 Body mass index (BMI) 23.0-23.9, adult: Secondary | ICD-10-CM | POA: Diagnosis not present

## 2017-05-09 DIAGNOSIS — R1011 Right upper quadrant pain: Secondary | ICD-10-CM | POA: Diagnosis not present

## 2017-05-09 DIAGNOSIS — R945 Abnormal results of liver function studies: Secondary | ICD-10-CM

## 2017-05-09 DIAGNOSIS — R935 Abnormal findings on diagnostic imaging of other abdominal regions, including retroperitoneum: Secondary | ICD-10-CM | POA: Diagnosis not present

## 2017-05-09 DIAGNOSIS — F17210 Nicotine dependence, cigarettes, uncomplicated: Secondary | ICD-10-CM | POA: Insufficient documentation

## 2017-05-09 DIAGNOSIS — Z79899 Other long term (current) drug therapy: Secondary | ICD-10-CM | POA: Insufficient documentation

## 2017-05-09 DIAGNOSIS — K2950 Unspecified chronic gastritis without bleeding: Secondary | ICD-10-CM | POA: Insufficient documentation

## 2017-05-09 DIAGNOSIS — K219 Gastro-esophageal reflux disease without esophagitis: Secondary | ICD-10-CM | POA: Diagnosis not present

## 2017-05-09 DIAGNOSIS — R634 Abnormal weight loss: Secondary | ICD-10-CM | POA: Insufficient documentation

## 2017-05-09 DIAGNOSIS — I10 Essential (primary) hypertension: Secondary | ICD-10-CM | POA: Insufficient documentation

## 2017-05-09 DIAGNOSIS — R933 Abnormal findings on diagnostic imaging of other parts of digestive tract: Secondary | ICD-10-CM | POA: Insufficient documentation

## 2017-05-09 DIAGNOSIS — F1010 Alcohol abuse, uncomplicated: Secondary | ICD-10-CM

## 2017-05-09 DIAGNOSIS — H547 Unspecified visual loss: Secondary | ICD-10-CM | POA: Insufficient documentation

## 2017-05-09 DIAGNOSIS — R7989 Other specified abnormal findings of blood chemistry: Secondary | ICD-10-CM

## 2017-05-09 DIAGNOSIS — R109 Unspecified abdominal pain: Secondary | ICD-10-CM | POA: Diagnosis present

## 2017-05-09 HISTORY — PX: ESOPHAGOGASTRODUODENOSCOPY (EGD) WITH PROPOFOL: SHX5813

## 2017-05-09 SURGERY — ESOPHAGOGASTRODUODENOSCOPY (EGD) WITH PROPOFOL
Anesthesia: Monitor Anesthesia Care

## 2017-05-09 MED ORDER — LIDOCAINE 2% (20 MG/ML) 5 ML SYRINGE
INTRAMUSCULAR | Status: DC | PRN
  Administered 2017-05-09: 40 mg via INTRAVENOUS

## 2017-05-09 MED ORDER — SODIUM CHLORIDE 0.9 % IV SOLN: INTRAVENOUS | Status: DC

## 2017-05-09 MED ORDER — ALBUTEROL SULFATE HFA 108 (90 BASE) MCG/ACT IN AERS
INHALATION_SPRAY | RESPIRATORY_TRACT | Status: DC | PRN
  Administered 2017-05-09: 2 via RESPIRATORY_TRACT

## 2017-05-09 MED ORDER — PROPOFOL 10 MG/ML IV BOLUS
INTRAVENOUS | Status: DC | PRN
  Administered 2017-05-09: 50 mg via INTRAVENOUS
  Administered 2017-05-09: 100 mg via INTRAVENOUS

## 2017-05-09 MED ORDER — ALBUTEROL SULFATE HFA 108 (90 BASE) MCG/ACT IN AERS
INHALATION_SPRAY | RESPIRATORY_TRACT | Status: AC
  Filled 2017-05-09: qty 6.7

## 2017-05-09 MED ORDER — LACTATED RINGERS IV SOLN
INTRAVENOUS | Status: DC
  Administered 2017-05-09 (×2): via INTRAVENOUS

## 2017-05-09 MED ORDER — PROPOFOL 10 MG/ML IV BOLUS
INTRAVENOUS | Status: AC
  Filled 2017-05-09: qty 40

## 2017-05-09 SURGICAL SUPPLY — 14 items

## 2017-05-09 NOTE — Interval H&P Note (Signed)
History and Physical Interval Note:  05/09/2017 11:36 AM  Austin [name]  has presented today for surgery, with the diagnosis of RUQ pain tt  The various methods of treatment have been discussed with the patient and family. After consideration of risks, benefits and other options for treatment, the patient has consented to  Procedure(s): ESOPHAGOGASTRODUODENOSCOPY (EGD) WITH PROPOFOL (N/A) as a surgical intervention .  The patient's history has been reviewed, patient examined, no change in status, stable for surgery.  I have reviewed the patient's chart and labs.  Questions were answered to the patient's satisfaction.     Nelida Meuse III

## 2017-05-09 NOTE — Op Note (Signed)
[facility] Patient Name: Austin [name] Procedure Date: 05/09/2017 MRN: [id number] Attending MD: Estill Cotta. Loletha Carrow , MD Date of Birth: 1965-12-06 CSN: [id number] Age: 51 Admit Type: Outpatient Procedure:                Upper GI endoscopy Indications:              Epigastric abdominal pain, Abdominal pain in the                            right upper quadrant, Abnormal CT of the GI tract                            (suggesting duodenal inflammation) Providers:                Estill Cotta. Loletha Carrow, MD, Elmer Ramp. Tilden Dome, RN, Danford Bad, Technician, Glenis Smoker, CRNA Referring MD:              Medicines:                Monitored Anesthesia Care Complications:            No immediate complications. Estimated Blood Loss:     Estimated blood loss was minimal. Procedure:                Pre-Anesthesia Assessment:                           - Prior to the procedure, a History and Physical                            was performed, and patient medications and                            allergies were reviewed. The patient's tolerance of                            previous anesthesia was also reviewed. The risks                            and benefits of the procedure and the sedation                            options and risks were discussed with the patient.                            All questions were answered, and informed consent                            was obtained. Prior Anticoagulants: The patient has                            taken no previous anticoagulant or antiplatelet  agents. ASA Grade Assessment: III - A patient with                            severe systemic disease. After reviewing the risks                            and benefits, the patient was deemed in                            satisfactory condition to undergo the procedure.                           After obtaining informed consent, the endoscope was                         passed under direct vision. Throughout the                            procedure, the patient's blood pressure, pulse, and                            oxygen saturations were monitored continuously. The                            Endoscope was introduced through the mouth, and                            advanced to the second part of duodenum. The upper                            GI endoscopy was accomplished without difficulty.                            The patient tolerated the procedure well. Scope In: Scope Out: Findings:      The esophagus was normal.      The entire examined stomach was normal. Biopsies were taken with a cold       forceps for histology. (Sidney protocol).      The cardia and gastric fundus were normal on retroflexion.      The examined duodenum was normal. Impression:               - Normal esophagus.                           - Normal stomach. Biopsied.                           - Normal examined duodenum. Moderate Sedation:      MAC sedation used Recommendation:           - Patient has a contact number available for                            emergencies. The signs and symptoms of potential  delayed complications were discussed with the                            patient. Return to normal activities tomorrow.                            Written discharge instructions were provided to the                            patient.                           - Resume previous diet.                           - Continue present medications, but decrease                            pantoprazole to 20 mg once daily.                           - Await pathology results.                           - Discontinue alcohol consumption indefinitely. The                            apparent duodenal inflammation initially raised                            concern for ulcer. However, it now appears to be                            due to  pancreatitis. Procedure Code(s):        --- Professional ---                           [id number], Esophagogastroduodenoscopy, flexible,                            transoral; with biopsy, single or multiple Diagnosis Code(s):        --- Professional ---                           R10.13, Epigastric pain                           R10.11, Right upper quadrant pain                           R93.3, Abnormal findings on diagnostic imaging of                            other parts of digestive tract CPT copyright 2016 [employer]. All rights reserved. The codes documented in this report are preliminary and upon coder review may  be revised to meet current compliance requirements. Henry L.  Loletha Carrow, MD 05/09/2017 12:04:40 PM This report has been signed electronically. Number of Addenda: 0

## 2017-05-09 NOTE — Discharge Instructions (Signed)
YOU HAD AN ENDOSCOPIC PROCEDURE TODAY: Refer to the procedure report and other information in the discharge instructions given to you for any specific questions about what was found during the examination. If this information does not answer your questions, please call [place] office at [phone] to clarify.  ° °YOU SHOULD EXPECT: Some feelings of bloating in the abdomen. Passage of more gas than usual. Walking can help get rid of the air that was put into your GI tract during the procedure and reduce the bloating. If you had a lower endoscopy (such as a colonoscopy or flexible sigmoidoscopy) you may notice spotting of blood in your stool or on the toilet paper. Some abdominal soreness may be present for a day or two, also. ° °DIET: Your first meal following the procedure should be a light meal and then it is ok to progress to your normal diet. A half-sandwich or bowl of soup is an example of a good first meal. Heavy or fried foods are harder to digest and may make you feel nauseous or bloated. Drink plenty of fluids but you should avoid alcoholic beverages for 24 hours. If you had a esophageal dilation, please see attached instructions for diet.   ° °ACTIVITY: Your care partner should take you home directly after the procedure. You should plan to take it easy, moving slowly for the rest of the day. You can resume normal activity the day after the procedure however YOU SHOULD NOT DRIVE, use power tools, machinery or perform tasks that involve climbing or major physical exertion for 24 hours (because of the sedation medicines used during the test).  ° °SYMPTOMS TO REPORT IMMEDIATELY: °A gastroenterologist can be reached at any hour. Please call [phone]  for any of the following symptoms:  °Following lower endoscopy (colonoscopy, flexible sigmoidoscopy) °Excessive amounts of blood in the stool  °Significant tenderness, worsening of abdominal pains  °Swelling of the abdomen that is new, acute  °Fever of 100° or  higher  °Following upper endoscopy (EGD, EUS, ERCP, esophageal dilation) °Vomiting of blood or coffee ground material  °New, significant abdominal pain  °New, significant chest pain or pain under the shoulder blades  °Painful or persistently difficult swallowing  °New shortness of breath  °Black, tarry-looking or red, bloody stools ° °FOLLOW UP:  °If any biopsies were taken you will be contacted by phone or by letter within the next 1-3 weeks. Call [phone]  if you have not heard about the biopsies in 3 weeks.  °Please also call with any specific questions about appointments or follow up tests. ° °

## 2017-05-09 NOTE — Anesthesia Preprocedure Evaluation (Addendum)
Anesthesia Evaluation  Patient identified by MRN, date of birth, ID band Patient awake    Reviewed: Allergy & Precautions, NPO status , Patient's Chart, lab work & pertinent test results  Airway Mallampati: II  TM Distance: >3 FB Neck ROM: Full    Dental  (+) Edentulous Upper, Edentulous Lower   Pulmonary neg pulmonary ROS, Current Smoker,    Pulmonary exam normal        Cardiovascular hypertension, Pt. on medications Normal cardiovascular exam     Neuro/Psych  Headaches, PSYCHIATRIC DISORDERS Anxiety    GI/Hepatic Neg liver ROS, GERD  Medicated,  Endo/Other  negative endocrine ROS  Renal/GU negative Renal ROS     Musculoskeletal  (+) Arthritis ,   Abdominal   Peds  Hematology negative hematology ROS (+)   Anesthesia Other Findings Day of surgery medications reviewed with the patient.  Reproductive/Obstetrics                           Anesthesia Physical Anesthesia Plan  ASA: II  Anesthesia Plan: MAC   Post-op Pain Management:    Induction: Intravenous  PONV Risk Score and Plan: Propofol infusion  Airway Management Planned: Natural Airway and Nasal Cannula  Additional Equipment:   Intra-op Plan:   Post-operative Plan:   Informed Consent: I have reviewed the patients History and Physical, chart, labs and discussed the procedure including the risks, benefits and alternatives for the proposed anesthesia with the patient or authorized representative who has indicated his/her understanding and acceptance.     Plan Discussed with:   Anesthesia Plan Comments:         Anesthesia Quick Evaluation

## 2017-05-09 NOTE — Anesthesia Procedure Notes (Signed)
Date/Time: 05/09/2017 12:43 AM Performed by: Cynda Familia, CRNA Pre-anesthesia Checklist: Patient identified, Emergency Drugs available, Suction available, Patient being monitored and Timeout performed Oxygen Delivery Method: Simple face mask Placement Confirmation: positive ETCO2 and breath sounds checked- equal and bilateral Dental Injury: Teeth and Oropharynx as per pre-operative assessment  Comments: Mask for sedation

## 2017-05-09 NOTE — Interval H&P Note (Signed)
History and Physical Interval Note:  05/09/2017 11:27 AM  Austin [name]  has presented today for surgery, with the diagnosis of RUQ pain tt  The various methods of treatment have been discussed with the patient and family. After consideration of risks, benefits and other options for treatment, the patient has consented to  Procedure(s): ESOPHAGOGASTRODUODENOSCOPY (EGD) WITH PROPOFOL (N/A) as a surgical intervention .  The patient's history has been reviewed, patient examined, no change in status, stable for surgery.  I have reviewed the patient's chart and labs.  Questions were answered to the patient's satisfaction.     Nelida Meuse III

## 2017-05-09 NOTE — Anesthesia Postprocedure Evaluation (Signed)
Anesthesia Post Note  Patient: Michale R Fearnow  Procedure(s) Performed: ESOPHAGOGASTRODUODENOSCOPY (EGD) WITH PROPOFOL (N/A )     Patient location during evaluation: PACU Anesthesia Type: MAC Level of consciousness: awake and alert Pain management: pain level controlled Vital Signs Assessment: post-procedure vital signs reviewed and stable Respiratory status: spontaneous breathing, nonlabored ventilation, respiratory function stable and patient connected to nasal cannula oxygen Cardiovascular status: stable and blood pressure returned to baseline Postop Assessment: no apparent nausea or vomiting Anesthetic complications: no    Last Vitals:  Vitals:   05/09/17 1210 05/09/17 1220  BP: 138/88 (!) 143/98  Pulse: 89 82  Resp: (!) 21 18  Temp:    SpO2: 100% 100%    Last Pain:  Vitals:   05/09/17 1200  TempSrc: Oral  PainSc:                  Effie Berkshire

## 2017-05-09 NOTE — Transfer of Care (Signed)
Immediate Anesthesia Transfer of Care Note  Patient: Austin [name]  Procedure(s) Performed: ESOPHAGOGASTRODUODENOSCOPY (EGD) WITH PROPOFOL (N/A )  Patient Location: PACU and Endoscopy Unit  Anesthesia Type:MAC  Level of Consciousness: awake and alert   Airway & Oxygen Therapy: Patient Spontanous Breathing and Patient connected to nasal cannula oxygen  Post-op Assessment: Report given to RN and Post -op Vital signs reviewed and stable  Post vital signs: Reviewed and stable  Last Vitals:  Vitals:   05/09/17 1030  BP: (!) 146/89  Pulse: 80  Resp: 18  Temp: 37.3 C  SpO2: 99%    Last Pain:  Vitals:   05/09/17 1030  TempSrc: Oral  PainSc: 4          Complications: No apparent anesthesia complications

## 2017-05-10 ENCOUNTER — Encounter (HOSPITAL_COMMUNITY): Payer: Self-pay | Admitting: Gastroenterology

## 2017-05-26 ENCOUNTER — Ambulatory Visit: Payer: Medicare Other | Admitting: Podiatry

## 2017-06-16 ENCOUNTER — Ambulatory Visit (INDEPENDENT_AMBULATORY_CARE_PROVIDER_SITE_OTHER): Payer: Medicare Other | Admitting: Podiatry

## 2017-06-16 ENCOUNTER — Encounter: Payer: Self-pay | Admitting: Podiatry

## 2017-06-16 DIAGNOSIS — B353 Tinea pedis: Secondary | ICD-10-CM

## 2017-06-16 DIAGNOSIS — M79676 Pain in unspecified toe(s): Secondary | ICD-10-CM

## 2017-06-16 DIAGNOSIS — L02619 Cutaneous abscess of unspecified foot: Secondary | ICD-10-CM | POA: Diagnosis not present

## 2017-06-16 DIAGNOSIS — B351 Tinea unguium: Secondary | ICD-10-CM | POA: Diagnosis not present

## 2017-06-16 DIAGNOSIS — L03119 Cellulitis of unspecified part of limb: Secondary | ICD-10-CM | POA: Diagnosis not present

## 2017-06-16 MED ORDER — KETOCONAZOLE 2 % EX CREA: 1.0000 "application " | TOPICAL_CREAM | Freq: Two times a day (BID) | CUTANEOUS | 2 refills | Status: DC

## 2017-06-16 MED ORDER — DOXYCYCLINE HYCLATE 100 MG PO TABS: 100.0000 mg | ORAL_TABLET | Freq: Two times a day (BID) | ORAL | 0 refills | Status: DC

## 2017-06-16 MED ORDER — TERBINAFINE HCL 250 MG PO TABS: 250.0000 mg | ORAL_TABLET | Freq: Every day | ORAL | 0 refills | Status: DC

## 2017-06-16 NOTE — Progress Notes (Signed)
He presents today chief complaint of a painful infection and painful elongated toenails laterally.  He states that the infection was between the fourth and fifth toes of the left foot and has been soaking in Epsom salts and warm work.  Objective: Vital signs are stable he is alert and oriented x3 evaluation of the left foot demonstrates elongated painful toenails thick yellow dystrophic-like mycotic patient is blind.  Also he has what appears to be a ruptured blister between the fourth and fifth digits of the left foot.  Assessment cellulitis due to a fungal infection between the fourth and fifth digits and pain in limb secondary to onychomycosis.  Plan: Patient on doxycycline 100 mg twice daily also placed him on a single once a day antifungal terbinafine 250 mg tablets.  Both of these for 2 weeks.  Also wrote a prescription for ketoconazole cream be applied between the toes and on the bottom of the foot bilaterally twice daily.  We will follow-up with him in 2 weeks.

## 2017-06-30 ENCOUNTER — Ambulatory Visit (INDEPENDENT_AMBULATORY_CARE_PROVIDER_SITE_OTHER): Payer: Medicare Other | Admitting: Podiatry

## 2017-06-30 ENCOUNTER — Encounter: Payer: Self-pay | Admitting: Podiatry

## 2017-06-30 DIAGNOSIS — L02612 Cutaneous abscess of left foot: Secondary | ICD-10-CM

## 2017-06-30 DIAGNOSIS — L03032 Cellulitis of left toe: Secondary | ICD-10-CM

## 2017-06-30 DIAGNOSIS — B353 Tinea pedis: Secondary | ICD-10-CM | POA: Diagnosis not present

## 2017-06-30 MED ORDER — TERBINAFINE HCL 250 MG PO TABS: 250.0000 mg | ORAL_TABLET | Freq: Every day | ORAL | 0 refills | Status: DC

## 2017-06-30 NOTE — Progress Notes (Signed)
Presents today for follow-up of painful lesion between the fourth and fifth toes of the left foot states that I did not get my medicine filled.  He has a painful cyst to his medial ankle left he states that it bothers him with his shoes.  He thinks is associated with trauma from work.  Objective: Vital signs are stable he is alert and oriented x3 much decrease in erythema and edema though he still has what appears to be interdigital tinea pedis.  Cellulitis has resolved nicely.  Nonpulsatile mass fluctuant but firm just anterior and medial to the medial malleolus.  Assessment: Resolving cellulitis.  Bursitis  Plan: Encouraged him to take the terbinafine which he has at his house and I will follow-up with him in a couple of weeks to make sure that he is doing better.  After sterile Betadine skin prep today and verbal permission we were able to aspirate this lesion.  A small amount of local anesthetic was injected initially and then an 18-gauge needle was introduced to draw off 2-1/2 cc of clear very liquidy fluid.  Did not appear to be infectious did not appear to be joint fluid.  Her to be more of a bursa.  Possibly a seroma.  Follow-up with him in a couple of weeks.

## 2017-07-14 ENCOUNTER — Encounter: Payer: Self-pay | Admitting: Podiatry

## 2017-07-14 ENCOUNTER — Ambulatory Visit (INDEPENDENT_AMBULATORY_CARE_PROVIDER_SITE_OTHER): Payer: Medicare Other | Admitting: Podiatry

## 2017-07-14 DIAGNOSIS — B351 Tinea unguium: Secondary | ICD-10-CM

## 2017-07-14 DIAGNOSIS — L02612 Cutaneous abscess of left foot: Secondary | ICD-10-CM | POA: Diagnosis not present

## 2017-07-14 DIAGNOSIS — Q828 Other specified congenital malformations of skin: Secondary | ICD-10-CM

## 2017-07-14 DIAGNOSIS — M79676 Pain in unspecified toe(s): Secondary | ICD-10-CM

## 2017-07-14 DIAGNOSIS — L03032 Cellulitis of left toe: Secondary | ICD-10-CM

## 2017-07-17 NOTE — Progress Notes (Signed)
He presents today after taking his terbinafine as we had requested he states that it really seems to have helped.  He is complaining of painful elongated toenails and multiple calluses.  Objective: Vital signs are stable alert and oriented x3.  Pulses are palpable.  Neurologic sensorium is intact.  Deep tendon reflexes are intact.  Toenails are long thick yellow dystrophic clinically mycotic.  His abscess has almost completely healed up.  He will continue his antifungals.  Assessment: InterDigital tinea.  Onychomycosis toenails 1 through 5 bilateral.  Calluses bilateral forefoot.  Plan: Debridement of all reactive hyperkeratotic tissue debridement of toenails 1 through 5 bilateral.

## 2017-08-13 ENCOUNTER — Encounter (HOSPITAL_COMMUNITY): Payer: Self-pay | Admitting: *Deleted

## 2017-08-13 ENCOUNTER — Emergency Department (HOSPITAL_COMMUNITY): Payer: Medicare Other

## 2017-08-13 ENCOUNTER — Other Ambulatory Visit: Payer: Self-pay

## 2017-08-13 ENCOUNTER — Emergency Department (HOSPITAL_COMMUNITY)
Admission: EM | Admit: 2017-08-13 | Discharge: 2017-08-13 | Disposition: A | Discharge status: Home / Self Care | Payer: Medicare Other | Attending: Emergency Medicine | Admitting: Emergency Medicine

## 2017-08-13 DIAGNOSIS — F17210 Nicotine dependence, cigarettes, uncomplicated: Secondary | ICD-10-CM | POA: Diagnosis not present

## 2017-08-13 DIAGNOSIS — K861 Other chronic pancreatitis: Secondary | ICD-10-CM | POA: Insufficient documentation

## 2017-08-13 DIAGNOSIS — K2980 Duodenitis without bleeding: Secondary | ICD-10-CM | POA: Insufficient documentation

## 2017-08-13 DIAGNOSIS — R1013 Epigastric pain: Secondary | ICD-10-CM | POA: Diagnosis present

## 2017-08-13 DIAGNOSIS — F1010 Alcohol abuse, uncomplicated: Secondary | ICD-10-CM | POA: Insufficient documentation

## 2017-08-13 DIAGNOSIS — I10 Essential (primary) hypertension: Secondary | ICD-10-CM | POA: Diagnosis not present

## 2017-08-13 DIAGNOSIS — Z79899 Other long term (current) drug therapy: Secondary | ICD-10-CM | POA: Insufficient documentation

## 2017-08-13 LAB — COMPREHENSIVE METABOLIC PANEL
ALBUMIN: 3.5 g/dL (ref 3.5–5.0)
ALT: 37 U/L (ref 17–63)
AST: 50 U/L — AB (ref 15–41)
Alkaline Phosphatase: 109 U/L (ref 38–126)
Anion gap: 11 (ref 5–15)
CO2: 26 mmol/L (ref 22–32)
CREATININE: 1.02 mg/dL (ref 0.61–1.24)
Calcium: 9.1 mg/dL (ref 8.9–10.3)
Chloride: 102 mmol/L (ref 101–111)
GFR calc Af Amer: >60 mL/min (ref >60)
GFR calc non Af Amer: >60 mL/min (ref >60)
GLUCOSE: 97 mg/dL (ref 65–99)
Potassium: 3.6 mmol/L (ref 3.5–5.1)
Sodium: 139 mmol/L (ref 135–145)
TOTAL PROTEIN: 6.9 g/dL (ref 6.5–8.1)
Total Bilirubin: 1.4 mg/dL — ABNORMAL HIGH (ref 0.3–1.2)

## 2017-08-13 LAB — LIPASE, BLOOD: Lipase: 75 U/L — ABNORMAL HIGH (ref 11–51)

## 2017-08-13 LAB — URINALYSIS, ROUTINE W REFLEX MICROSCOPIC
Bacteria, UA: NONE SEEN
Bilirubin Urine: NEGATIVE
Glucose, UA: NEGATIVE mg/dL
Ketones, ur: 20 mg/dL — AB
LEUKOCYTES UA: NEGATIVE
NITRITE: NEGATIVE
Protein, ur: 30 mg/dL — AB
SPECIFIC GRAVITY, URINE: 1.021 (ref 1.005–1.030)
pH: 5 (ref 5.0–8.0)

## 2017-08-13 LAB — CBC
HEMATOCRIT: 38.3 % — AB (ref 39.0–52.0)
Hemoglobin: 13.3 g/dL (ref 13.0–17.0)
MCH: 33.6 pg (ref 26.0–34.0)
MCHC: 34.7 g/dL (ref 30.0–36.0)
MCV: 96.7 fL (ref 78.0–100.0)
PLATELETS: 86 10*3/uL — AB (ref 150–400)
RBC: 3.96 MIL/uL — ABNORMAL LOW (ref 4.22–5.81)
RDW: 13.5 % (ref 11.5–15.5)
WBC: 6.1 10*3/uL (ref 4.0–10.5)

## 2017-08-13 MED ORDER — HYDROMORPHONE HCL 1 MG/ML IJ SOLN
1.0000 mg | Freq: Once | INTRAMUSCULAR | Status: AC
  Administered 2017-08-13: 1 mg via INTRAVENOUS
  Filled 2017-08-13: qty 1

## 2017-08-13 MED ORDER — SODIUM CHLORIDE 0.9 % IV BOLUS (SEPSIS)
2000.0000 mL | Freq: Once | INTRAVENOUS | Status: AC
  Administered 2017-08-13: 2000 mL via INTRAVENOUS

## 2017-08-13 MED ORDER — IOPAMIDOL (ISOVUE-300) INJECTION 61%
INTRAVENOUS | Status: AC
  Administered 2017-08-13: 100 mL
  Filled 2017-08-13: qty 100

## 2017-08-13 MED ORDER — OXYCODONE-ACETAMINOPHEN 5-325 MG PO TABS
1.0000 | ORAL_TABLET | Freq: Once | ORAL | Status: AC
  Administered 2017-08-13: 1 via ORAL
  Filled 2017-08-13: qty 1

## 2017-08-13 MED ORDER — OXYCODONE HCL 5 MG PO TABS: 5.0000 mg | ORAL_TABLET | Freq: Four times a day (QID) | ORAL | 0 refills | Status: DC | PRN

## 2017-08-13 NOTE — Discharge Instructions (Signed)
Take the pain medication as prescribed for severe pain only.  Take Zantac as directed avoid all alcohol, beer, or wine.  Call [facility] gastroenterology on Monday, 08/15/2017 to schedule next available appointment.  Stay on clear liquid diet such as broth, Jell-O, juices or water for the next 24-48 hours.  Ask your primary care physician to help you to stop smoking as smoking and alcohol contributes to your abdominal pain.  Get your blood pressure rechecked within the next 3 weeks.  Today's was elevated at 144/97

## 2017-08-13 NOTE — ED Provider Notes (Addendum)
[facility]ER[facility]ENC[facility] DEPARTMENT Provider Note   CSN: [id number] Arrival date & time: 08/13/17  1625     History   Chief Complaint Chief Complaint  Patient presents with  . Nausea  . Abdominal Pain    HPI Austin [name] is a 52 y.o. male.  Planes of epigastric pain nonradiating onset 6 days ago after drinking alcohol.  Associated symptoms include nausea no vomiting.  His last bowel movement was 4 days ago.  He continues to pass gas per rectum.  He denies any fever.  He is not nauseated present.  He states nothing to eat or drink today.  Nothing makes symptoms better or worse.  No treatment prior to coming here.  Symptoms feel like pancreatitis he said in the past as any shortness of breath.  No other associated symptoms HPI  Past Medical History:  Diagnosis Date  . Alcohol abuse   . Anxiety   . Arthritis   . 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 Elevated LFTs   . Foot and toe(s), blister    bilat feet, blistering between toes  . GERD (gastroesophageal reflux disease)   . Glaucoma   . Headache(784.0)   . Hypertension    hx of  . Insomnia   . UTI (lower urinary tract infection) 08/04/11   finishing abx tomorrow  . Weight loss    25 lb wt loss since 08/2010  . Wrist fracture, right     Patient Active Problem List   Diagnosis Date Noted  . RUQ pain   . Abnormal CT of the abdomen   . 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 ESOPHAGOGASTRODUODENOSCOPY    . ESOPHAGOGASTRODUODENOSCOPY (EGD) WITH PROPOFOL N/A 05/09/2017   Procedure: ESOPHAGOGASTRODUODENOSCOPY (EGD) WITH PROPOFOL;  Surgeon: Doran Stabler, MD;  Location: [facility];  Service: Gastroenterology;  Laterality: N/A;  . EYE SURGERY Left   . EYE SURGERY Right    prostetic eye  . foot srugery     removal of bone near small toe  . FRACTURE SURGERY Right    wrist  . HARDWARE REMOVAL  08/06/2011   Procedure: HARDWARE REMOVAL;  Surgeon: Newt Minion, MD;  Location: [facility];  Service: Orthopedics;  Laterality: Right;  Removal Deep Retained Hardware Right Distal Radius  . MULTIPLE TOOTH EXTRACTIONS     for dentures       Home Medications    Prior to Admission medications   Medication Sig Start Date End Date Taking? Authorizing Provider  amLODipine (NORVASC) 5 MG tablet Take 5 mg daily by mouth. 04/05/17   [provider]  cetirizine (ZYRTEC) 10 MG tablet Take 10 mg daily by mouth. 04/05/17   [provider]  doxycycline (VIBRA-TABS) 100 MG tablet Take 1 tablet (100 mg total) by mouth 2 (two) times daily. 06/16/17   Hyatt, Max T, DPM  HYDROcodone-acetaminophen (NORCO/VICODIN) 5-325 MG tablet Take 1 tablet every 4 (four) hours as needed by mouth for pain. 04/13/17   [provider]  ipratropium (ATROVENT) 0.03 % nasal spray Place 2 sprays 2 (two) times daily into both nostrils. 04/05/17   [provider]  ketoconazole (NIZORAL) 2 % cream Apply 1 application topically 2 (two) times daily. 06/16/17   Hyatt, Max T, DPM  mirtazapine (REMERON) 15 MG tablet Take 15 mg by mouth at bedtime. 03/02/17   [provider]  naltrexone (DEPADE) 50 MG tablet Take 50 mg by mouth daily. 06/02/17   [provider]  omeprazole (PRILOSEC) 40 MG capsule Take 40 mg by mouth every evening. 06/02/17   [provider]  oxybutynin (DITROPAN) 5 MG tablet Take 5 mg 2 (two) times daily by mouth. 04/18/17   [provider]  oxyCODONE (ROXICODONE) 5 MG immediate release tablet Take 1 tablet (5 mg total) every 4 (four) hours as needed by mouth for severe pain. 04/25/17   Virgel Manifold, MD  pantoprazole (PROTONIX) 20 MG tablet Take 1 tablet (20 mg total) 2 (two) times daily before a meal by mouth. 04/25/17   Virgel Manifold, MD  sucralfate (CARAFATE) 1 g tablet Take 1 tablet (1 g total) 4 (four) times daily -  with meals and at bedtime by mouth. 04/25/17    Virgel Manifold, MD  terbinafine (LAMISIL) 250 MG tablet Take 1 tablet (250 mg total) by mouth daily. 06/30/17   Hyatt, Max T, DPM  atorvastatin (LIPITOR) 20 MG tablet Take 20 mg by mouth daily at 6 PM.  03/22/13 06/06/14  [provider]    Family History Family History  Problem Relation Age of Onset  . Diabetes Sister   . Diabetes Maternal Aunt        aunt and uncles  . Hypertension Sister        aunt and uncles maternal side  . Colon cancer Neg Hx     Social History Social History   Tobacco Use  . Smoking status: Current Every Day Smoker    Packs/day: 1.00    Types: Cigarettes, Cigars  . Smokeless tobacco: Never Used  . Tobacco comment: 1/2 - 1 cigar/day  Substance Use Topics  . Alcohol use: Yes    Alcohol/week: 3.5 oz    Types: 7 Standard drinks or equivalent per week  . Drug use: Yes    Types: Marijuana    Comment: smokes marijuana 1 to 2 times daily     Allergies   Patient has no known allergies.   Review of Systems Review of Systems  Constitutional: Positive for appetite change.  Eyes: Positive for visual disturbance.       Blind  Gastrointestinal: Positive for abdominal pain and nausea.     Physical Exam Updated Vital Signs BP 137/90 (BP Location: Left Arm)   Pulse 72   Temp 98.9 F (37.2 C) (Oral)   Resp 18   SpO2 100%   Physical Exam  Constitutional: He appears well-developed and well-nourished.  HENT:  Head: Normocephalic and atraumatic.  Eyes: Conjunctivae are normal.  Bilateral cornea is opacified  Neck: Neck supple.  Cardiovascular: Normal rate and regular rhythm.  No murmur heard. Pulmonary/Chest: Effort normal and breath sounds normal. No respiratory distress.  Abdominal: Soft. There is tenderness.  Tender at epigastrium, small umbilical hernia which is nontender not red or warm  Genitourinary: Penis normal.  Musculoskeletal: He exhibits no edema.  Neurological: He is alert.  Skin: Skin is warm and dry.  Psychiatric: He  has a normal mood and affect.  Nursing note and vitals reviewed.    ED Treatments / Results  Labs (all labs ordered are listed, but only abnormal results are displayed) Labs Reviewed  LIPASE, BLOOD - Abnormal; Notable for the following components:      Result Value   Lipase 75 (*)    All other components within normal limits  COMPREHENSIVE METABOLIC PANEL - Abnormal; Notable for the following components:   BUN <5 (*)  AST 50 (*)    Total Bilirubin 1.4 (*)    All other components within normal limits  CBC - Abnormal; Notable for the following components:   RBC 3.96 (*)    HCT 38.3 (*)    Platelets 86 (*)    All other components within normal limits  URINALYSIS, ROUTINE W REFLEX MICROSCOPIC - Abnormal; Notable for the following components:   Color, Urine AMBER (*)    Hgb urine dipstick MODERATE (*)    Ketones, ur 20 (*)    Protein, ur 30 (*)    Squamous Epithelial / LPF 0-5 (*)    All other components within normal limits    EKG  EKG Interpretation None       Radiology No results found.  Procedures Procedures (including critical care time)  Medications Ordered in ED Medications  HYDROmorphone (DILAUDID) injection 1 mg (not administered)  sodium chloride 0.9 % bolus 2,000 mL (not administered)     Results for orders placed or performed during the hospital encounter of 08/13/17  Lipase, blood  Result Value Ref Range   Lipase 75 (H) 11 - 51 U/L  Comprehensive metabolic panel  Result Value Ref Range   Sodium 139 135 - 145 mmol/L   Potassium 3.6 3.5 - 5.1 mmol/L   Chloride 102 101 - 111 mmol/L   CO2 26 22 - 32 mmol/L   Glucose, Bld 97 65 - 99 mg/dL   BUN <5 (L) 6 - 20 mg/dL   Creatinine, Ser 1.02 0.61 - 1.24 mg/dL   Calcium 9.1 8.9 - 10.3 mg/dL   Total Protein 6.9 6.5 - 8.1 g/dL   Albumin 3.5 3.5 - 5.0 g/dL   AST 50 (H) 15 - 41 U/L   ALT 37 17 - 63 U/L   Alkaline Phosphatase 109 38 - 126 U/L   Total Bilirubin 1.4 (H) 0.3 - 1.2 mg/dL   GFR calc non Af  Amer >60 >60 mL/min   GFR calc Af Amer >60 >60 mL/min   Anion gap 11 5 - 15  CBC  Result Value Ref Range   WBC 6.1 4.0 - 10.5 K/uL   RBC 3.96 (L) 4.22 - 5.81 MIL/uL   Hemoglobin 13.3 13.0 - 17.0 g/dL   HCT 38.3 (L) 39.0 - 52.0 %   MCV 96.7 78.0 - 100.0 fL   MCH 33.6 26.0 - 34.0 pg   MCHC 34.7 30.0 - 36.0 g/dL   RDW 13.5 11.5 - 15.5 %   Platelets 86 (L) 150 - 400 K/uL  Urinalysis, Routine w reflex microscopic  Result Value Ref Range   Color, Urine AMBER (A) YELLOW   APPearance CLEAR CLEAR   Specific Gravity, Urine 1.021 1.005 - 1.030   pH 5.0 5.0 - 8.0   Glucose, UA NEGATIVE NEGATIVE mg/dL   Hgb urine dipstick MODERATE (A) NEGATIVE   Bilirubin Urine NEGATIVE NEGATIVE   Ketones, ur 20 (A) NEGATIVE mg/dL   Protein, ur 30 (A) NEGATIVE mg/dL   Nitrite NEGATIVE NEGATIVE   Leukocytes, UA NEGATIVE NEGATIVE   RBC / HPF 6-30 0 - 5 RBC/hpf   WBC, UA 0-5 0 - 5 WBC/hpf   Bacteria, UA NONE SEEN NONE SEEN   Squamous Epithelial / LPF 0-5 (A) NONE SEEN   Mucus PRESENT    Hyaline Casts, UA PRESENT    Ct Abdomen Pelvis W Contrast  Result Date: 08/13/2017 CLINICAL DATA:  Nausea and abdominal pain EXAM: CT ABDOMEN AND PELVIS WITH CONTRAST TECHNIQUE: Multidetector CT imaging of  the abdomen and pelvis was performed using the standard protocol following bolus administration of intravenous contrast. CONTRAST:  160mL ISOVUE-300 COMPARISON:  04/25/2017 FINDINGS: Lower chest: No acute abnormality. Hepatobiliary: Mild fatty infiltration of the liver is noted. No gallstones, gallbladder wall thickening, or biliary dilatation. Pancreas: Unremarkable. No pancreatic ductal dilatation or surrounding inflammatory changes. Spleen: Normal in size without focal abnormality. Adrenals/Urinary Tract: Adrenal glands are unremarkable. Kidneys are normal, without renal calculi, focal lesion, or hydronephrosis. Bladder is unremarkable. Stomach/Bowel: Some very mild inflammatory changes are noted surrounding the second portion  the duodenum similar to that seen on the prior exam which may represent some mild duodenitis. No evidence of perforation is noted. The appendix is not well visualized. No inflammatory changes to suggest appendicitis are seen. No obstructive changes are noted. Vascular/Lymphatic: Aortic atherosclerosis. No enlarged abdominal or pelvic lymph nodes. Reproductive: Prostate is unremarkable. Other: Stable prominent umbilicus. Trace free fluid is again noted in the pelvis. Musculoskeletal: No acute bony abnormality is noted. IMPRESSION: Periduodenal inflammatory changes similar to that seen on the prior exam. No definitive perforation or ulceration is seen. Chronic changes similar to that seen on the prior exam. No new focal abnormality is noted. Electronically Signed   By: Inez Catalina M.D.   On: 08/13/2017 22:36    Initial Impression / Assessment and Plan / ED Course  I have reviewed the triage vital signs and the nursing notes.  Pertinent labs & imaging results that were available during my care of the patient were reviewed by me and considered in my medical decision making (see chart for details).    Results for orders placed or performed during the hospital encounter of 08/13/17  Lipase, blood  Result Value Ref Range   Lipase 75 (H) 11 - 51 U/L  Comprehensive metabolic panel  Result Value Ref Range   Sodium 139 135 - 145 mmol/L   Potassium 3.6 3.5 - 5.1 mmol/L   Chloride 102 101 - 111 mmol/L   CO2 26 22 - 32 mmol/L   Glucose, Bld 97 65 - 99 mg/dL   BUN <5 (L) 6 - 20 mg/dL   Creatinine, Ser 1.02 0.61 - 1.24 mg/dL   Calcium 9.1 8.9 - 10.3 mg/dL   Total Protein 6.9 6.5 - 8.1 g/dL   Albumin 3.5 3.5 - 5.0 g/dL   AST 50 (H) 15 - 41 U/L   ALT 37 17 - 63 U/L   Alkaline Phosphatase 109 38 - 126 U/L   Total Bilirubin 1.4 (H) 0.3 - 1.2 mg/dL   GFR calc non Af Amer >60 >60 mL/min   GFR calc Af Amer >60 >60 mL/min   Anion gap 11 5 - 15  CBC  Result Value Ref Range   WBC 6.1 4.0 - 10.5 K/uL   RBC  3.96 (L) 4.22 - 5.81 MIL/uL   Hemoglobin 13.3 13.0 - 17.0 g/dL   HCT 38.3 (L) 39.0 - 52.0 %   MCV 96.7 78.0 - 100.0 fL   MCH 33.6 26.0 - 34.0 pg   MCHC 34.7 30.0 - 36.0 g/dL   RDW 13.5 11.5 - 15.5 %   Platelets 86 (L) 150 - 400 K/uL  Urinalysis, Routine w reflex microscopic  Result Value Ref Range   Color, Urine AMBER (A) YELLOW   APPearance CLEAR CLEAR   Specific Gravity, Urine 1.021 1.005 - 1.030   pH 5.0 5.0 - 8.0   Glucose, UA NEGATIVE NEGATIVE mg/dL   Hgb urine dipstick MODERATE (A) NEGATIVE  Bilirubin Urine NEGATIVE NEGATIVE   Ketones, ur 20 (A) NEGATIVE mg/dL   Protein, ur 30 (A) NEGATIVE mg/dL   Nitrite NEGATIVE NEGATIVE   Leukocytes, UA NEGATIVE NEGATIVE   RBC / HPF 6-30 0 - 5 RBC/hpf   WBC, UA 0-5 0 - 5 WBC/hpf   Bacteria, UA NONE SEEN NONE SEEN   Squamous Epithelial / LPF 0-5 (A) NONE SEEN   Mucus PRESENT    Hyaline Casts, UA PRESENT    Ct Abdomen Pelvis W Contrast  Result Date: 08/13/2017 CLINICAL DATA:  Nausea and abdominal pain EXAM: CT ABDOMEN AND PELVIS WITH CONTRAST TECHNIQUE: Multidetector CT imaging of the abdomen and pelvis was performed using the standard protocol following bolus administration of intravenous contrast. CONTRAST:  175mL ISOVUE-300 COMPARISON:  04/25/2017 FINDINGS: Lower chest: No acute abnormality. Hepatobiliary: Mild fatty infiltration of the liver is noted. No gallstones, gallbladder wall thickening, or biliary dilatation. Pancreas: Unremarkable. No pancreatic ductal dilatation or surrounding inflammatory changes. Spleen: Normal in size without focal abnormality. Adrenals/Urinary Tract: Adrenal glands are unremarkable. Kidneys are normal, without renal calculi, focal lesion, or hydronephrosis. Bladder is unremarkable. Stomach/Bowel: Some very mild inflammatory changes are noted surrounding the second portion the duodenum similar to that seen on the prior exam which may represent some mild duodenitis. No evidence of perforation is noted. The  appendix is not well visualized. No inflammatory changes to suggest appendicitis are seen. No obstructive changes are noted. Vascular/Lymphatic: Aortic atherosclerosis. No enlarged abdominal or pelvic lymph nodes. Reproductive: Prostate is unremarkable. Other: Stable prominent umbilicus. Trace free fluid is again noted in the pelvis. Musculoskeletal: No acute bony abnormality is noted. IMPRESSION: Periduodenal inflammatory changes similar to that seen on the prior exam. No definitive perforation or ulceration is seen. Chronic changes similar to that seen on the prior exam. No new focal abnormality is noted. Electronically Signed   By: Inez Catalina M.D.   On: 08/13/2017 22:36   10:45 PM pain somewhat improved after treatment with intravenous hydromorphone and intravenous hydration with normal saline.  At 11 p.m. he is able to drink water without difficulty.  He will be given Percocet 1 tablet prior to discharge. Plan prescription [facility], Zantac as directed.  Referral [facility] gastroenterology.[place] Controlled Substance reporting System queried.  Avoid alcohol.counseled patient for 5 minutes on smoking cessation Repeat blood pressure 3 weeks Final Clinical Impressions(s) / ED Diagnoses  Diagnoses #1 chronic pancreatitis Final diagnoses:  None   #2 duodenitis #3 alcohol abuse #4 tobacco abuse ED Discharge Orders    None    #5 elevated blood pressure   Orlie Dakin, MD 08/13/17 [id number]    Orlie Dakin, MD 08/13/17 [place], MD 08/13/17 [id number]

## 2017-08-13 NOTE — ED Triage Notes (Signed)
To ED for eval of nausea and abd pain. States he noticed swelling to right rib area. No sob noted. No difficulty with urination. Describes constipation

## 2017-08-25 ENCOUNTER — Encounter: Payer: Self-pay | Admitting: Podiatry

## 2017-08-25 ENCOUNTER — Ambulatory Visit (INDEPENDENT_AMBULATORY_CARE_PROVIDER_SITE_OTHER): Payer: Medicare Other | Admitting: Podiatry

## 2017-08-25 DIAGNOSIS — M7741 Metatarsalgia, right foot: Secondary | ICD-10-CM | POA: Diagnosis not present

## 2017-08-25 DIAGNOSIS — M7742 Metatarsalgia, left foot: Secondary | ICD-10-CM

## 2017-08-25 NOTE — Progress Notes (Signed)
He presents today for follow-up of an abscess and tinea pedis bilaterally he is complaining of painful elongated toenails and calluses.  He states that his skin is doing much better he is very happy with the outcome thus far.  Pain to the forefoot bilateral.  New shoe gear.  Objective: Vital signs are stable he is alert and oriented x3 cellulitis has resolved 100% and there is no signs of tinea pedis.  His toenails are long thick yellow dystrophic-like mycotic and he is unable to cut them due to his blindness.  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  He has pain on palpation of the lesser metatarsals  Assessment: Pain in limb secondary to porokeratosis as well as painful mycotic nails.  Metatarsalgia.  Plan: Debridement of toenails bilaterally follow-up with him in a few months for re-debridement I did discuss appropriate shoe gear and possible orthotics.

## 2017-11-01 ENCOUNTER — Other Ambulatory Visit: Payer: Self-pay

## 2017-11-01 ENCOUNTER — Ambulatory Visit: Payer: Medicare Other | Admitting: Podiatry

## 2018-03-20 ENCOUNTER — Other Ambulatory Visit: Payer: Self-pay

## 2018-03-20 ENCOUNTER — Emergency Department (HOSPITAL_COMMUNITY): Payer: Medicare Other

## 2018-03-20 ENCOUNTER — Emergency Department (HOSPITAL_COMMUNITY)
Admission: EM | Admit: 2018-03-20 | Discharge: 2018-03-20 | Disposition: A | Discharge status: Home / Self Care | Payer: Medicare Other | Attending: Emergency Medicine | Admitting: Emergency Medicine

## 2018-03-20 ENCOUNTER — Encounter (HOSPITAL_COMMUNITY): Payer: Self-pay

## 2018-03-20 DIAGNOSIS — K852 Alcohol induced acute pancreatitis without necrosis or infection: Secondary | ICD-10-CM | POA: Diagnosis not present

## 2018-03-20 DIAGNOSIS — F17210 Nicotine dependence, cigarettes, uncomplicated: Secondary | ICD-10-CM | POA: Insufficient documentation

## 2018-03-20 DIAGNOSIS — R1011 Right upper quadrant pain: Secondary | ICD-10-CM | POA: Diagnosis present

## 2018-03-20 DIAGNOSIS — I10 Essential (primary) hypertension: Secondary | ICD-10-CM | POA: Diagnosis not present

## 2018-03-20 LAB — CBC WITH DIFFERENTIAL/PLATELET
Abs Immature Granulocytes: 0.01 10*3/uL (ref 0.00–0.07)
BASOS ABS: 0 10*3/uL (ref 0.0–0.1)
BASOS PCT: 1 %
EOS PCT: 2 %
Eosinophils Absolute: 0.1 10*3/uL (ref 0.0–0.5)
HCT: 41.8 % (ref 39.0–52.0)
Hemoglobin: 14.4 g/dL (ref 13.0–17.0)
Immature Granulocytes: 0 %
LYMPHS PCT: 24 %
Lymphs Abs: 1.3 10*3/uL (ref 0.7–4.0)
MCH: 31.4 pg (ref 26.0–34.0)
MCHC: 34.4 g/dL (ref 30.0–36.0)
MCV: 91.1 fL (ref 80.0–100.0)
MONO ABS: 0.7 10*3/uL (ref 0.1–1.0)
Monocytes Relative: 12 %
NEUTROS ABS: 3.3 10*3/uL (ref 1.7–7.7)
NRBC: 0 % (ref 0.0–0.2)
Neutrophils Relative %: 61 %
PLATELETS: 111 10*3/uL — AB (ref 150–400)
RBC: 4.59 MIL/uL (ref 4.22–5.81)
RDW: 13.2 % (ref 11.5–15.5)
WBC: 5.4 10*3/uL (ref 4.0–10.5)

## 2018-03-20 LAB — URINALYSIS, ROUTINE W REFLEX MICROSCOPIC
BILIRUBIN URINE: NEGATIVE
Glucose, UA: NEGATIVE mg/dL
KETONES UR: 20 mg/dL — AB
LEUKOCYTES UA: NEGATIVE
NITRITE: NEGATIVE
PROTEIN: NEGATIVE mg/dL
SPECIFIC GRAVITY, URINE: 1.044 — AB (ref 1.005–1.030)
pH: 5 (ref 5.0–8.0)

## 2018-03-20 LAB — COMPREHENSIVE METABOLIC PANEL
ALBUMIN: 3.7 g/dL (ref 3.5–5.0)
ALT: 22 U/L (ref 0–44)
ANION GAP: 13 (ref 5–15)
AST: 37 U/L (ref 15–41)
Alkaline Phosphatase: 113 U/L (ref 38–126)
BUN: 9 mg/dL (ref 6–20)
CHLORIDE: 97 mmol/L — AB (ref 98–111)
CO2: 24 mmol/L (ref 22–32)
Calcium: 8.8 mg/dL — ABNORMAL LOW (ref 8.9–10.3)
Creatinine, Ser: 0.94 mg/dL (ref 0.61–1.24)
GFR calc non Af Amer: >60 mL/min (ref >60)
Glucose, Bld: 117 mg/dL — ABNORMAL HIGH (ref 70–99)
POTASSIUM: 4 mmol/L (ref 3.5–5.1)
SODIUM: 134 mmol/L — AB (ref 135–145)
Total Bilirubin: 1 mg/dL (ref 0.3–1.2)
Total Protein: 7.1 g/dL (ref 6.5–8.1)

## 2018-03-20 LAB — LIPASE, BLOOD: Lipase: 119 U/L — ABNORMAL HIGH (ref 11–51)

## 2018-03-20 MED ORDER — ONDANSETRON HCL 4 MG/2ML IJ SOLN
4.0000 mg | Freq: Once | INTRAMUSCULAR | Status: AC
  Administered 2018-03-20: 4 mg via INTRAVENOUS
  Filled 2018-03-20: qty 2

## 2018-03-20 MED ORDER — HYDROMORPHONE HCL 1 MG/ML IJ SOLN
1.0000 mg | Freq: Once | INTRAMUSCULAR | Status: AC
  Administered 2018-03-20: 1 mg via INTRAVENOUS
  Filled 2018-03-20: qty 1

## 2018-03-20 MED ORDER — SODIUM CHLORIDE 0.9 % IV BOLUS
1000.0000 mL | Freq: Once | INTRAVENOUS | Status: AC
  Administered 2018-03-20: 1000 mL via INTRAVENOUS

## 2018-03-20 MED ORDER — IOPAMIDOL (ISOVUE-300) INJECTION 61%
100.0000 mL | Freq: Once | INTRAVENOUS | Status: AC | PRN
Start: 2018-03-20 — End: 2018-03-20
  Administered 2018-03-20: 100 mL via INTRAVENOUS

## 2018-03-20 MED ORDER — SODIUM CHLORIDE 0.9 % IJ SOLN
INTRAMUSCULAR | Status: AC
  Filled 2018-03-20: qty 50

## 2018-03-20 MED ORDER — OXYCODONE HCL 5 MG PO TABS: 5.0000 mg | ORAL_TABLET | Freq: Four times a day (QID) | ORAL | 0 refills | Status: DC | PRN

## 2018-03-20 MED ORDER — ONDANSETRON HCL 4 MG PO TABS: 4.0000 mg | ORAL_TABLET | Freq: Four times a day (QID) | ORAL | 0 refills | Status: DC

## 2018-03-20 MED ORDER — MORPHINE SULFATE (PF) 4 MG/ML IV SOLN
4.0000 mg | Freq: Once | INTRAVENOUS | Status: AC
  Administered 2018-03-20: 4 mg via INTRAVENOUS
  Filled 2018-03-20: qty 1

## 2018-03-20 MED ORDER — IOPAMIDOL (ISOVUE-300) INJECTION 61%
INTRAVENOUS | Status: AC
  Filled 2018-03-20: qty 100

## 2018-03-20 NOTE — ED Provider Notes (Signed)
[facility] DEPT Provider Note   CSN: [id number] Arrival date & time: 03/20/18  0759     History   Chief Complaint Chief Complaint  Patient presents with  . Abdominal Pain    HPI Austin [name] is a 52 y.o. male.  HPI Patient states that he drank vodka and ate fried fish Friday night.  Began feeling bad at that point.  States he developed abdominal pain.  This worsened overnight.  Reports nausea and vomiting.  States he is also having dizziness especially with standing.  Described as feeling off balance.  Has continued to have abdominal pain mostly in the epigastric and right upper quadrant that radiates through to his back.  States it feels similar to bouts of pancreatitis.  Has not had a bowel movement in several days but is still passing gas.  No fever or chills. Past Medical History:  Diagnosis Date  . Alcohol abuse   . Anxiety   . Arthritis   . 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 Elevated LFTs   . Foot and toe(s), blister    bilat feet, blistering between toes  . GERD (gastroesophageal reflux disease)   . Glaucoma   . Headache(784.0)   . Hypertension    hx of  . Insomnia   . UTI (lower urinary tract infection) 08/04/11   finishing abx tomorrow  . Weight loss    25 lb wt loss since 08/2010  . Wrist fracture, right     Patient Active Problem List   Diagnosis Date Noted  . RUQ pain   . Abnormal CT of the abdomen   . 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 ESOPHAGOGASTRODUODENOSCOPY    . ESOPHAGOGASTRODUODENOSCOPY (EGD) WITH PROPOFOL N/A 05/09/2017   Procedure: ESOPHAGOGASTRODUODENOSCOPY (EGD) WITH PROPOFOL;  Surgeon: Doran Stabler, MD;  Location: [facility];  Service: Gastroenterology;  Laterality: N/A;  . EYE SURGERY Left   . EYE SURGERY Right    prostetic eye  . foot srugery     removal of  bone near small toe  . FRACTURE SURGERY Right    wrist  . HARDWARE REMOVAL  08/06/2011   Procedure: HARDWARE REMOVAL;  Surgeon: Newt Minion, MD;  Location: [facility];  Service: Orthopedics;  Laterality: Right;  Removal Deep Retained Hardware Right Distal Radius  . MULTIPLE TOOTH EXTRACTIONS     for dentures        Home Medications    Prior to Admission medications   Medication Sig Start Date End Date Taking? Authorizing Provider  ketoconazole (NIZORAL) 2 % cream Apply 1 application topically 2 (two) times daily. Patient not taking: Reported on 03/20/2018 06/16/17   Hyatt, Max T, DPM  ondansetron (ZOFRAN) 4 MG tablet Take 1 tablet (4 mg total) by mouth every 6 (six) hours. 03/20/18   Julianne Rice, MD  oxyCODONE (ROXICODONE) 5 MG immediate release tablet Take 1 tablet (5 mg total) by mouth every 6 (six) hours as needed for severe pain. 03/20/18   Julianne Rice, MD  pantoprazole (PROTONIX) 20 MG tablet Take 1 tablet (20 mg total) 2 (two) times daily before a meal by mouth. Patient not taking: Reported on 03/20/2018 04/25/17   Virgel Manifold, MD  sucralfate (CARAFATE) 1 g tablet Take 1 tablet (1 g total) 4 (four) times daily -  with meals and  at bedtime by mouth. Patient not taking: Reported on 03/20/2018 04/25/17   Virgel Manifold, MD  terbinafine (LAMISIL) 250 MG tablet Take 1 tablet (250 mg total) by mouth daily. Patient not taking: Reported on 03/20/2018 06/30/17   Garrel Ridgel, DPM    Family History Family History  Problem Relation Age of Onset  . Diabetes Sister   . Diabetes Maternal Aunt        aunt and uncles  . Hypertension Sister        aunt and uncles maternal side  . Colon cancer Neg Hx     Social History Social History   Tobacco Use  . Smoking status: Current Every Day Smoker    Packs/day: 1.00    Types: Cigarettes, Cigars  . Smokeless tobacco: Never Used  . Tobacco comment: 1/2 - 1 cigar/day  Substance Use Topics  . Alcohol use: Yes    Alcohol/week: 7.0  standard drinks    Types: 7 Standard drinks or equivalent per week  . Drug use: Yes    Types: Marijuana    Comment: smokes marijuana 1 to 2 times daily     Allergies   Patient has no known allergies.   Review of Systems Review of Systems  Constitutional: Negative for chills and fever.  HENT: Negative for sore throat and trouble swallowing.   Eyes: Negative for visual disturbance.  Respiratory: Negative for cough and shortness of breath.   Gastrointestinal: Positive for abdominal pain, constipation, nausea and vomiting. Negative for diarrhea.  Genitourinary: Negative for dysuria, flank pain and frequency.  Musculoskeletal: Positive for back pain, gait problem and myalgias. Negative for neck pain and neck stiffness.  Skin: Negative for rash and wound.  Neurological: Positive for dizziness. Negative for light-headedness, numbness and headaches.  All other systems reviewed and are negative.    Physical Exam Updated Vital Signs BP (!) 142/87   Pulse (!) 58   Temp 98.8 F (37.1 C) (Oral)   Resp 16   Wt 63.5 kg   SpO2 94%   BMI 21.93 kg/m   Physical Exam  Constitutional: He is oriented to person, place, and time. He appears well-developed and well-nourished. No distress.  HENT:  Head: Normocephalic and atraumatic.  Mouth/Throat: Oropharynx is clear and moist. No oropharyngeal exudate.  Eyes:  Right eye prosthesis.  Left eye with opacified cornea.  Patient does have fatigable horizontal nystagmus present in the left eye.  Neck: Normal range of motion. Neck supple. No JVD present.  Cardiovascular: Normal rate and regular rhythm. Exam reveals no gallop and no friction rub.  No murmur heard. Pulmonary/Chest: Effort normal and breath sounds normal. No stridor. No respiratory distress. He has no wheezes. He has no rales. He exhibits no tenderness.  Abdominal: Soft. Bowel sounds are normal. There is tenderness. There is no rebound and no guarding.  Epigastric and right upper  quadrant tenderness to palpation.  Mild right lower quadrant tenderness.  Patient does have an umbilical hernia which is extended but is soft and nontender.  Musculoskeletal: Normal range of motion. He exhibits no edema or tenderness.  Mild right flank tenderness to percussion.  No midline thoracic or lumbar tenderness.  No lower extremity swelling, asymmetry or tenderness. Distal pulses are 2+.  Lymphadenopathy:    He has no cervical adenopathy.  Neurological: He is alert and oriented to person, place, and time.  Moving all extremities without focal deficit.  Sensation intact.  Skin: Skin is warm and dry. Capillary refill takes less than 2  seconds. No rash noted. He is not diaphoretic. No erythema.  Psychiatric: He has a normal mood and affect. His behavior is normal.  Nursing note and vitals reviewed.    ED Treatments / Results  Labs (all labs ordered are listed, but only abnormal results are displayed) Labs Reviewed  CBC WITH DIFFERENTIAL/PLATELET - Abnormal; Notable for the following components:      Result Value   Platelets 111 (*)    All other components within normal limits  COMPREHENSIVE METABOLIC PANEL - Abnormal; Notable for the following components:   Sodium 134 (*)    Chloride 97 (*)    Glucose, Bld 117 (*)    Calcium 8.8 (*)    All other components within normal limits  LIPASE, BLOOD - Abnormal; Notable for the following components:   Lipase 119 (*)    All other components within normal limits  URINALYSIS, ROUTINE W REFLEX MICROSCOPIC - Abnormal; Notable for the following components:   Specific Gravity, Urine 1.044 (*)    Hgb urine dipstick MODERATE (*)    Ketones, ur 20 (*)    Bacteria, UA RARE (*)    All other components within normal limits    EKG None  Radiology Ct Abdomen Pelvis W Contrast  Result Date: 03/20/2018 CLINICAL DATA:  52 year old male with right upper quadrant abdominal pain for 3 days. History of pancreatitis. Initial encounter. EXAM: CT  ABDOMEN AND PELVIS WITH CONTRAST TECHNIQUE: Multidetector CT imaging of the abdomen and pelvis was performed using the standard protocol following bolus administration of intravenous contrast. CONTRAST:  112mL ISOVUE-300 IOPAMIDOL (ISOVUE-300) INJECTION 61% COMPARISON:  08/13/2017 CT. FINDINGS: Lower chest: Bibasilar subsegmental atelectasis. Heart size within normal limits. Hepatobiliary: Mild fatty infiltration liver. No worrisome hepatic lesion noted. No calcified gallstones. Pancreas: Hazy infiltration surrounds pancreatic head and uncinate process. No obstructing stone or mass identified. Spleen: No mass or enlargement. Adrenals/Urinary Tract: No obstructing stone or hydronephrosis. No worrisome renal or adrenal mass. Noncontrast filled views the urinary bladder with slight impression upon the bladder base by prostate gland otherwise negative. Stomach/Bowel: Circumferential thickening gastric pylorus. Inflammatory process surrounds the proximal thi duodenum. Vascular/Lymphatic: Atherosclerotic changes aorta without aortic aneurysm or large vessel occlusion. Scattered normal size lymph nodes. Reproductive: Slightly lobulated prostate gland causes slight impression upon the bladder base. Other: No free air. Small amount of free fluid right pericolic gutter and within the pelvis. Small periumbilical hernia. No bowel containing hernia. Musculoskeletal: Mild degenerative changes lower lumbar spine. Minimal lucencies of the ilium unchanged. IMPRESSION: 1. Mild inflammatory process centered at the pancreatic head/neck junction with inflamed appearing gastric antrum and proximal duodenum. Small amount of free fluid. Findings may reflect changes of pancreatitis as versus primary antritis/duodenitis with secondary involvement of the pancreatic head/uncinate process. 2. Mild fatty infiltration of the liver. 3.  Aortic Atherosclerosis (ICD10-I70.0). 4. Slightly lobulated prostate gland with mild impression upon the bladder  base. Electronically Signed   By: Genia Del M.D.   On: 03/20/2018 10:56    Procedures Procedures (including critical care time)  Medications Ordered in ED Medications  iopamidol (ISOVUE-300) 61 % injection (has no administration in time range)  sodium chloride 0.9 % injection (has no administration in time range)  ondansetron (ZOFRAN) injection 4 mg (4 mg Intravenous Given 03/20/18 0837)  morphine 4 MG/ML injection 4 mg (4 mg Intravenous Given 03/20/18 0837)  sodium chloride 0.9 % bolus 1,000 mL (0 mLs Intravenous Stopped 03/20/18 1005)  iopamidol (ISOVUE-300) 61 % injection 100 mL (100 mLs Intravenous Contrast  Given 03/20/18 1016)  HYDROmorphone (DILAUDID) injection 1 mg (1 mg Intravenous Given 03/20/18 1325)     Initial Impression / Assessment and Plan / ED Course  I have reviewed the triage vital signs and the nursing notes.  Pertinent labs & imaging results that were available during my care of the patient were reviewed by me and considered in my medical decision making (see chart for details).    Pain is controlled.  Patient is tolerating oral intake.  Will treat symptomatically.  Advised to avoid alcohol.  Follow-up with primary physician and return precautions given.   Final Clinical Impressions(s) / ED Diagnoses   Final diagnoses:  Alcohol-induced acute pancreatitis, unspecified complication status    ED Discharge Orders         Ordered    oxyCODONE (ROXICODONE) 5 MG immediate release tablet  Every 6 hours PRN     03/20/18 1443    ondansetron (ZOFRAN) 4 MG tablet  Every 6 hours     03/20/18 1443           Julianne Rice, MD 03/20/18 1447

## 2018-03-20 NOTE — Discharge Instructions (Signed)
Avoid alcohol.  Follow-up with your primary physician.

## 2018-03-20 NOTE — ED Triage Notes (Addendum)
Patient BIB EMS from workplace for complaints of RUQ abdominal pain x3 days. Patient reports a history of pancreatitis. Patient reports the pain started after eating fried fish and vodka on Friday evening, which patient states he knows is a causation for pain in the past. Patient reports nausea x3 days, denies vomiting/diarrhea/fever. 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 EMS VS 138/100, RR 18, HR 72, O2 Sat 98% RA.

## 2018-03-20 NOTE — ED Notes (Signed)
Pt d/c home per MD order. Discharge summary reviewed with pt, pt verbalizes understanding. Pt called friend [phone]) reports will be to lobby to pick up as discharge ride home. Pt to lobby in [facility] without complication.

## 2018-03-20 NOTE — ED Notes (Signed)
Bed: XB26 Expected date:  Expected time:  Means of arrival:  Comments: EMS-pancreatitis

## 2018-03-20 NOTE — ED Notes (Signed)
Patient provided for ice water per ED MD order for PO trial. Will monitor.

## 2018-04-14 ENCOUNTER — Other Ambulatory Visit: Payer: Self-pay

## 2018-04-14 ENCOUNTER — Emergency Department (HOSPITAL_COMMUNITY): Payer: Medicare Other

## 2018-04-14 ENCOUNTER — Encounter (HOSPITAL_COMMUNITY): Payer: Self-pay | Admitting: Emergency Medicine

## 2018-04-14 ENCOUNTER — Observation Stay (HOSPITAL_COMMUNITY)
Admission: EM | Admit: 2018-04-14 | Discharge: 2018-04-15 | Disposition: A | Discharge status: Home / Self Care | Payer: Medicare Other | Attending: Internal Medicine | Admitting: Internal Medicine

## 2018-04-14 DIAGNOSIS — K219 Gastro-esophageal reflux disease without esophagitis: Secondary | ICD-10-CM | POA: Diagnosis not present

## 2018-04-14 DIAGNOSIS — H409 Unspecified glaucoma: Secondary | ICD-10-CM | POA: Diagnosis not present

## 2018-04-14 DIAGNOSIS — F17210 Nicotine dependence, cigarettes, uncomplicated: Secondary | ICD-10-CM | POA: Diagnosis not present

## 2018-04-14 DIAGNOSIS — I10 Essential (primary) hypertension: Secondary | ICD-10-CM | POA: Diagnosis not present

## 2018-04-14 DIAGNOSIS — Z947 Corneal transplant status: Secondary | ICD-10-CM | POA: Insufficient documentation

## 2018-04-14 DIAGNOSIS — Z8249 Family history of ischemic heart disease and other diseases of the circulatory system: Secondary | ICD-10-CM | POA: Insufficient documentation

## 2018-04-14 DIAGNOSIS — E871 Hypo-osmolality and hyponatremia: Secondary | ICD-10-CM | POA: Diagnosis not present

## 2018-04-14 DIAGNOSIS — Z970 Presence of artificial eye: Secondary | ICD-10-CM | POA: Diagnosis not present

## 2018-04-14 DIAGNOSIS — Z7289 Other problems related to lifestyle: Secondary | ICD-10-CM

## 2018-04-14 DIAGNOSIS — Z79899 Other long term (current) drug therapy: Secondary | ICD-10-CM | POA: Insufficient documentation

## 2018-04-14 DIAGNOSIS — Z789 Other specified health status: Secondary | ICD-10-CM

## 2018-04-14 DIAGNOSIS — M1990 Unspecified osteoarthritis, unspecified site: Secondary | ICD-10-CM | POA: Diagnosis not present

## 2018-04-14 DIAGNOSIS — H543 Unqualified visual loss, both eyes: Secondary | ICD-10-CM

## 2018-04-14 DIAGNOSIS — F1010 Alcohol abuse, uncomplicated: Secondary | ICD-10-CM | POA: Insufficient documentation

## 2018-04-14 DIAGNOSIS — K852 Alcohol induced acute pancreatitis without necrosis or infection: Secondary | ICD-10-CM | POA: Diagnosis not present

## 2018-04-14 DIAGNOSIS — K859 Acute pancreatitis without necrosis or infection, unspecified: Secondary | ICD-10-CM | POA: Diagnosis present

## 2018-04-14 DIAGNOSIS — H547 Unspecified visual loss: Secondary | ICD-10-CM | POA: Diagnosis not present

## 2018-04-14 DIAGNOSIS — F1090 Alcohol use, unspecified, uncomplicated: Secondary | ICD-10-CM

## 2018-04-14 HISTORY — DX: Acute pancreatitis without necrosis or infection, unspecified: K85.90

## 2018-04-14 LAB — CBC WITH DIFFERENTIAL/PLATELET
ABS IMMATURE GRANULOCYTES: 0.01 10*3/uL (ref 0.00–0.07)
BASOS PCT: 0 %
Basophils Absolute: 0 10*3/uL (ref 0.0–0.1)
EOS ABS: 0.1 10*3/uL (ref 0.0–0.5)
EOS PCT: 2 %
HCT: 43.3 % (ref 39.0–52.0)
HEMOGLOBIN: 14.8 g/dL (ref 13.0–17.0)
Immature Granulocytes: 0 %
Lymphocytes Relative: 28 %
Lymphs Abs: 1.5 10*3/uL (ref 0.7–4.0)
MCH: 31.9 pg (ref 26.0–34.0)
MCHC: 34.2 g/dL (ref 30.0–36.0)
MCV: 93.3 fL (ref 80.0–100.0)
MONO ABS: 0.6 10*3/uL (ref 0.1–1.0)
MONOS PCT: 12 %
Neutro Abs: 3.1 10*3/uL (ref 1.7–7.7)
Neutrophils Relative %: 58 %
Platelets: 147 10*3/uL — ABNORMAL LOW (ref 150–400)
RBC: 4.64 MIL/uL (ref 4.22–5.81)
RDW: 13 % (ref 11.5–15.5)
WBC: 5.3 10*3/uL (ref 4.0–10.5)
nRBC: 0 % (ref 0.0–0.2)

## 2018-04-14 LAB — COMPREHENSIVE METABOLIC PANEL
ALK PHOS: 97 U/L (ref 38–126)
ALT: 18 U/L (ref 0–44)
ANION GAP: 11 (ref 5–15)
AST: 27 U/L (ref 15–41)
Albumin: 4.2 g/dL (ref 3.5–5.0)
BILIRUBIN TOTAL: 1.1 mg/dL (ref 0.3–1.2)
BUN: 11 mg/dL (ref 6–20)
CALCIUM: 9.2 mg/dL (ref 8.9–10.3)
CO2: 28 mmol/L (ref 22–32)
Chloride: 95 mmol/L — ABNORMAL LOW (ref 98–111)
Creatinine, Ser: 1.08 mg/dL (ref 0.61–1.24)
Glucose, Bld: 108 mg/dL — ABNORMAL HIGH (ref 70–99)
POTASSIUM: 3.5 mmol/L (ref 3.5–5.1)
Sodium: 134 mmol/L — ABNORMAL LOW (ref 135–145)
TOTAL PROTEIN: 7.5 g/dL (ref 6.5–8.1)

## 2018-04-14 LAB — URINALYSIS, ROUTINE W REFLEX MICROSCOPIC
BILIRUBIN URINE: NEGATIVE
Bacteria, UA: NONE SEEN
Glucose, UA: NEGATIVE mg/dL
KETONES UR: 20 mg/dL — AB
LEUKOCYTES UA: NEGATIVE
Nitrite: NEGATIVE
PROTEIN: NEGATIVE mg/dL
Specific Gravity, Urine: 1.032 — ABNORMAL HIGH (ref 1.005–1.030)
pH: 6 (ref 5.0–8.0)

## 2018-04-14 LAB — I-STAT TROPONIN, ED: TROPONIN I, POC: 0 ng/mL (ref 0.00–0.08)

## 2018-04-14 LAB — ETHANOL

## 2018-04-14 LAB — LIPASE, BLOOD: LIPASE: 121 U/L — AB (ref 11–51)

## 2018-04-14 LAB — TROPONIN I: Troponin I: <0.03 ng/mL (ref <0.03)

## 2018-04-14 MED ORDER — ENOXAPARIN SODIUM 40 MG/0.4ML ~~LOC~~ SOLN
40.0000 mg | SUBCUTANEOUS | Status: DC
  Administered 2018-04-14: 40 mg via SUBCUTANEOUS
  Filled 2018-04-14: qty 0.4

## 2018-04-14 MED ORDER — ONDANSETRON HCL 4 MG PO TABS: 4.0000 mg | ORAL_TABLET | Freq: Four times a day (QID) | ORAL | Status: DC | PRN

## 2018-04-14 MED ORDER — LORAZEPAM 2 MG/ML IJ SOLN: 0.0000 mg | Freq: Two times a day (BID) | INTRAMUSCULAR | Status: DC

## 2018-04-14 MED ORDER — MORPHINE SULFATE (PF) 4 MG/ML IV SOLN
4.0000 mg | INTRAVENOUS | Status: AC | PRN
  Administered 2018-04-14 (×2): 4 mg via INTRAVENOUS
  Filled 2018-04-14 (×2): qty 1

## 2018-04-14 MED ORDER — FENTANYL CITRATE (PF) 100 MCG/2ML IJ SOLN
50.0000 ug | INTRAMUSCULAR | Status: DC | PRN
  Administered 2018-04-14: 50 ug via INTRAVENOUS
  Filled 2018-04-14: qty 2

## 2018-04-14 MED ORDER — LORAZEPAM 2 MG/ML IJ SOLN: 0.0000 mg | Freq: Four times a day (QID) | INTRAMUSCULAR | Status: DC

## 2018-04-14 MED ORDER — BISACODYL 10 MG RE SUPP
10.0000 mg | Freq: Once | RECTAL | Status: AC
  Administered 2018-04-14: 10 mg via RECTAL
  Filled 2018-04-14: qty 1

## 2018-04-14 MED ORDER — HYDRALAZINE HCL 20 MG/ML IJ SOLN: 10.0000 mg | Freq: Four times a day (QID) | INTRAMUSCULAR | Status: DC | PRN

## 2018-04-14 MED ORDER — MIRTAZAPINE 15 MG PO TABS
15.0000 mg | ORAL_TABLET | Freq: Every day | ORAL | Status: DC
  Administered 2018-04-14: 15 mg via ORAL
  Filled 2018-04-14: qty 1

## 2018-04-14 MED ORDER — ONDANSETRON HCL 4 MG/2ML IJ SOLN: 4.0000 mg | Freq: Four times a day (QID) | INTRAMUSCULAR | Status: DC | PRN

## 2018-04-14 MED ORDER — LORAZEPAM 1 MG PO TABS: 1.0000 mg | ORAL_TABLET | Freq: Four times a day (QID) | ORAL | Status: DC | PRN

## 2018-04-14 MED ORDER — THIAMINE HCL 100 MG/ML IJ SOLN
100.0000 mg | Freq: Every day | INTRAMUSCULAR | Status: DC
  Administered 2018-04-14 – 2018-04-15 (×2): 100 mg via INTRAVENOUS
  Filled 2018-04-14 (×2): qty 2

## 2018-04-14 MED ORDER — SODIUM CHLORIDE (PF) 0.9 % IJ SOLN
INTRAMUSCULAR | Status: AC
  Filled 2018-04-14: qty 50

## 2018-04-14 MED ORDER — FOLIC ACID 1 MG PO TABS: 1.0000 mg | ORAL_TABLET | Freq: Every day | ORAL | Status: DC

## 2018-04-14 MED ORDER — IOPAMIDOL (ISOVUE-300) INJECTION 61%
INTRAVENOUS | Status: AC
  Filled 2018-04-14: qty 100

## 2018-04-14 MED ORDER — FAMOTIDINE IN NACL 20-0.9 MG/50ML-% IV SOLN
20.0000 mg | Freq: Once | INTRAVENOUS | Status: AC
  Administered 2018-04-14: 20 mg via INTRAVENOUS
  Filled 2018-04-14: qty 50

## 2018-04-14 MED ORDER — ONDANSETRON HCL 4 MG/2ML IJ SOLN
4.0000 mg | INTRAMUSCULAR | Status: DC | PRN
  Administered 2018-04-14: 4 mg via INTRAVENOUS
  Filled 2018-04-14: qty 2

## 2018-04-14 MED ORDER — LORAZEPAM 2 MG/ML IJ SOLN: 1.0000 mg | Freq: Four times a day (QID) | INTRAMUSCULAR | Status: DC | PRN

## 2018-04-14 MED ORDER — IOPAMIDOL (ISOVUE-300) INJECTION 61%
100.0000 mL | Freq: Once | INTRAVENOUS | Status: AC | PRN
  Administered 2018-04-14: 100 mL via INTRAVENOUS

## 2018-04-14 MED ORDER — FAMOTIDINE IN NACL 20-0.9 MG/50ML-% IV SOLN
20.0000 mg | Freq: Two times a day (BID) | INTRAVENOUS | Status: DC
  Administered 2018-04-14 – 2018-04-15 (×2): 20 mg via INTRAVENOUS
  Filled 2018-04-14 (×3): qty 50

## 2018-04-14 MED ORDER — VITAMIN B-1 100 MG PO TABS: 100.0000 mg | ORAL_TABLET | Freq: Every day | ORAL | Status: DC

## 2018-04-14 MED ORDER — SODIUM CHLORIDE 0.9 % IV SOLN
INTRAVENOUS | Status: DC
  Administered 2018-04-14 – 2018-04-15 (×4): via INTRAVENOUS

## 2018-04-14 MED ORDER — ADULT MULTIVITAMIN W/MINERALS CH: 1.0000 | ORAL_TABLET | Freq: Every day | ORAL | Status: DC

## 2018-04-14 MED ORDER — MORPHINE SULFATE (PF) 2 MG/ML IV SOLN
2.0000 mg | INTRAVENOUS | Status: DC | PRN
  Administered 2018-04-14: 4 mg via INTRAVENOUS
  Administered 2018-04-14 – 2018-04-15 (×2): 2 mg via INTRAVENOUS
  Filled 2018-04-14: qty 1
  Filled 2018-04-14 (×2): qty 2

## 2018-04-14 NOTE — H&P (Signed)
History and Physical    Austin [name] DUK:[id number] DOB: [birth year] DOA: 04/14/2018  PCP: [facility] Patient coming from: Home   I have personally briefly reviewed patient's old medical records in [place]  Chief Complaint: Abdominal pain.   HPI: Austin [name] is a 52 y.o. male with medical history significant of alcoholic pancreatitis, blindness, alcohol abuse who presents complaining of abdominal pain, sharp in quality, intermittent, 8/10, epigastrium radiates to back. He relates pain started 5 days prior to admission. Last alcohol drink was 5 days ago. He has not been able to eat due to pain. He report nausea. No vomiting. Last BM 5 days ago.    ED Course: patient presents with abdominal pain, he has received morphine and fentanyl. His pain is not controlled. Sodium 134, lipase 121, alcohol level less than 5, CT abdomen and pelvis; Findings consistent with pancreatitis of the uncinate process and head of the pancreas. Findings are slightly more pronounced than on the previous exam. No evidence of pseudocyst or other complication.  Review of Systems: As per HPI otherwise 10 point review of systems negative.    Past Medical History:  Diagnosis Date  . Alcohol abuse   . Anxiety   . Arthritis   . 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 Elevated LFTs   . Foot and toe(s), blister    bilat feet, blistering between toes  . GERD (gastroesophageal reflux disease)   . Glaucoma   . Headache(784.0)   . Hypertension    hx of  . Insomnia   . Pancreatitis   . UTI (lower urinary tract infection) 08/04/11   finishing abx tomorrow  . Weight loss    25 lb wt loss since 08/2010  . Wrist fracture, right     Past Surgical History:  Procedure Laterality Date  . ESOPHAGOGASTRODUODENOSCOPY    . ESOPHAGOGASTRODUODENOSCOPY (EGD) WITH PROPOFOL N/A 05/09/2017   Procedure: ESOPHAGOGASTRODUODENOSCOPY (EGD) WITH PROPOFOL;  Surgeon: Doran Stabler, MD;   Location: [facility];  Service: Gastroenterology;  Laterality: N/A;  . EYE SURGERY Left   . EYE SURGERY Right    prostetic eye  . foot srugery     removal of bone near small toe  . FRACTURE SURGERY Right    wrist  . HARDWARE REMOVAL  08/06/2011   Procedure: HARDWARE REMOVAL;  Surgeon: Newt Minion, MD;  Location: [facility];  Service: Orthopedics;  Laterality: Right;  Removal Deep Retained Hardware Right Distal Radius  . MULTIPLE TOOTH EXTRACTIONS     for dentures     reports that he has been smoking cigarettes and cigars. He has been smoking about 1.00 pack per day. He has never used smokeless tobacco. He reports that he drinks about 7.0 standard drinks of alcohol per week. He reports that he has current or past drug history. Drug: Marijuana.  No Known Allergies  Family History  Problem Relation Age of Onset  . Diabetes Sister   . Diabetes Maternal Aunt        aunt and uncles  . Hypertension Sister        aunt and uncles maternal side  . Colon cancer Neg Hx     Prior to Admission medications   Medication Sig Start Date End Date Taking? Authorizing Provider  amLODipine (NORVASC) 5 MG tablet Take 5 mg by mouth daily.   Yes [provider]  mirtazapine (REMERON) 15 MG tablet Take 15 mg by  mouth at bedtime.   Yes [provider]  naltrexone (DEPADE) 50 MG tablet Take 50 mg by mouth at bedtime.   Yes [provider]  omeprazole (PRILOSEC) 40 MG capsule Take 40 mg by mouth daily.   Yes [provider]  ketoconazole (NIZORAL) 2 % cream Apply 1 application topically 2 (two) times daily. Patient not taking: Reported on 03/20/2018 06/16/17   Hyatt, Max T, DPM  ondansetron (ZOFRAN) 4 MG tablet Take 1 tablet (4 mg total) by mouth every 6 (six) hours. Patient not taking: Reported on 04/14/2018 03/20/18   Julianne Rice, MD  oxyCODONE (ROXICODONE) 5 MG immediate release tablet Take 1 tablet (5 mg total) by mouth every 6 (six) hours as needed for severe  pain. Patient not taking: Reported on 04/14/2018 03/20/18   Julianne Rice, MD  pantoprazole (PROTONIX) 20 MG tablet Take 1 tablet (20 mg total) 2 (two) times daily before a meal by mouth. Patient not taking: Reported on 03/20/2018 04/25/17   Virgel Manifold, MD  sucralfate (CARAFATE) 1 g tablet Take 1 tablet (1 g total) 4 (four) times daily -  with meals and at bedtime by mouth. Patient not taking: Reported on 03/20/2018 04/25/17   Virgel Manifold, MD  terbinafine (LAMISIL) 250 MG tablet Take 1 tablet (250 mg total) by mouth daily. Patient not taking: Reported on 03/20/2018 06/30/17   Garrel Ridgel, [place]    Physical Exam: Vitals:   04/14/18 1256 04/14/18 1400 04/14/18 1500 04/14/18 1530  BP:    (!) 143/96  Pulse: 64 64 (!) 58 (!) 59  Resp:  17 13 15   SpO2: 100% 100% 100% 100%  Weight:      Height:        Constitutional: NAD, calm, comfortable Vitals:   04/14/18 1256 04/14/18 1400 04/14/18 1500 04/14/18 1530  BP:    (!) 143/96  Pulse: 64 64 (!) 58 (!) 59  Resp:  17 13 15   SpO2: 100% 100% 100% 100%  Weight:      Height:       Eyes: PERRL, lids and conjunctivae normal ENMT: Mucous membranes are moist. Posterior pharynx clear of any exudate or lesions.Normal dentition.  Neck: normal, supple, no masses, no thyromegaly Respiratory: clear to auscultation bilaterally, no wheezing, no crackles. Normal respiratory effort. No accessory muscle use.  Cardiovascular: Regular rate and rhythm, no murmurs / rubs / gallops. No extremity edema. 2+ pedal pulses. No carotid bruits.  Abdomen: no tenderness, no masses palpated. No hepatosplenomegaly. Bowel sounds positive.  Musculoskeletal: no clubbing / cyanosis. No joint deformity upper and lower extremities. Good ROM, no contractures. Normal muscle tone.  Skin: no rashes, lesions, ulcers. No induration Neurologic: CN 2-12 grossly intact. Sensation intact, DTR normal. Strength 5/5 in all 4.  Psychiatric: Normal judgment and insight. Alert and  oriented x 3. Normal mood.    Labs on Admission: I have personally reviewed following labs and imaging studies  CBC: Recent Labs  Lab 04/14/18 1144  WBC 5.3  NEUTROABS 3.1  HGB 14.8  HCT 43.3  MCV 93.3  PLT 620*   Basic Metabolic Panel: Recent Labs  Lab 04/14/18 1144  NA 134*  K 3.5  CL 95*  CO2 28  GLUCOSE 108*  BUN 11  CREATININE 1.08  CALCIUM 9.2   GFR: Estimated Creatinine Clearance: 66.8 mL/min (by C-G formula based on SCr of 1.08 mg/dL). Liver Function Tests: Recent Labs  Lab 04/14/18 1144  AST 27  ALT 18  ALKPHOS 97  BILITOT 1.1  PROT 7.5  ALBUMIN 4.2   Recent Labs  Lab 04/14/18 1144  LIPASE 121*   No results for input(s): AMMONIA in the last 168 hours. Coagulation Profile: No results for input(s): INR, PROTIME in the last 168 hours. Cardiac Enzymes: Recent Labs  Lab 04/14/18 1144  TROPONINI <0.03   BNP (last 3 results) No results for input(s): PROBNP in the last 8760 hours. HbA1C: No results for input(s): HGBA1C in the last 72 hours. CBG: No results for input(s): GLUCAP in the last 168 hours. Lipid Profile: No results for input(s): CHOL, HDL, LDLCALC, TRIG, CHOLHDL, LDLDIRECT in the last 72 hours. Thyroid Function Tests: No results for input(s): TSH, T4TOTAL, FREET4, T3FREE, THYROIDAB in the last 72 hours. Anemia Panel: No results for input(s): VITAMINB12, FOLATE, FERRITIN, TIBC, IRON, RETICCTPCT in the last 72 hours. Urine analysis:    Component Value Date/Time   COLORURINE AMBER (A) 04/14/2018 1503   APPEARANCEUR CLEAR 04/14/2018 1503   LABSPEC 1.032 (H) 04/14/2018 1503   PHURINE 6.0 04/14/2018 1503   GLUCOSEU NEGATIVE 04/14/2018 1503   HGBUR MODERATE (A) 04/14/2018 1503   BILIRUBINUR NEGATIVE 04/14/2018 1503   KETONESUR 20 (A) 04/14/2018 1503   PROTEINUR NEGATIVE 04/14/2018 1503   UROBILINOGEN 2.0 (H) 09/13/2016 1039   NITRITE NEGATIVE 04/14/2018 1503   LEUKOCYTESUR NEGATIVE 04/14/2018 1503    Radiological Exams on  Admission: Dg Chest 2 View  Result Date: 04/14/2018 CLINICAL DATA:  Right-sided abdominal pain radiating to right flank 5 days. Nausea. EXAM: CHEST - 2 VIEW COMPARISON:  08/15/2014 FINDINGS: Lungs are adequately inflated and otherwise clear. Cardiomediastinal silhouette and remainder of the exam is unchanged. IMPRESSION: No active cardiopulmonary disease. Electronically Signed   By: Marin Olp M.D.   On: 04/14/2018 12:40   Ct Abdomen Pelvis W Contrast  Result Date: 04/14/2018 CLINICAL DATA:  Right upper quadrant abdominal pain and back pain with nausea. History of pancreatitis EXAM: CT ABDOMEN AND PELVIS WITH CONTRAST TECHNIQUE: Multidetector CT imaging of the abdomen and pelvis was performed using the standard protocol following bolus administration of intravenous contrast. CONTRAST:  19mL ISOVUE-300 IOPAMIDOL (ISOVUE-300) INJECTION 61% COMPARISON:  03/20/2018.  08/13/2017. FINDINGS: Lower chest: Normal Hepatobiliary: Normal Pancreas: Mild swelling and peripancreatic inflammatory change in the region of the uncinate process and head consistent with acute pancreatitis. Findings are similar to the previous study, possibly slightly more pronounced. No evidence of pseudocyst. Spleen: Normal Adrenals/Urinary Tract: Adrenal glands are normal. Kidneys are normal. Bladder is normal. Stomach/Bowel: Ileus pattern.  No focal or primary bowel finding. Vascular/Lymphatic: Aortic atherosclerosis. No aneurysm. IVC is normal. No retroperitoneal adenopathy. Reproductive: Normal Other: Small amount of free fluid in the pelvis. Musculoskeletal: Normal IMPRESSION: Findings consistent with pancreatitis of the uncinate process and head of the pancreas. Findings are slightly more pronounced than on the previous exam. No evidence of pseudocyst or other complication. Electronically Signed   By: Nelson Chimes M.D.   On: 04/14/2018 13:24    EKG: Independently reviewed. Sinus rhythm.   Assessment/Plan Active Problems:    Alcoholic pancreatitis   Essential hypertension   Blindness of both eyes   Alcohol use   Hyponatremia  1-Alcoholic Pancreatitis;  Presents with abdominal pain, nausea, not oral intake. CT abdomen: Findings consistent with pancreatitis of the uncinate process and head of the pancreas. Findings are slightly more pronounced than on the previous exam. No evidence of pseudocyst or other complication. Admit to med-surgery.  NPO.  IV fluids, IV Pepcid.  IV morphine.   2-HTN; hold Norvasc, due to  NPO.  IV PRN Hydralazine.   3-Alcohol abuse;  CIWA Protocol.   4-Mild Hyponatremia; IV fluids.     DVT prophylaxis:lovenox.  Code Status: full code.  Family Communication: care discussed with patient  Disposition Plan: discharge home when tolerating diet  Consults called: none Admission status: observation. Will observe and treat with fluids, to see if pain improved.     Elmarie Shiley MD [facility] Pager [phone]  If 7PM-7AM, please contact night-coverage [email or web address] Password [facility]  04/14/2018, 4:26 PM

## 2018-04-14 NOTE — ED Provider Notes (Signed)
[facility] DEPT Provider Note   CSN: [id number] Arrival date & time: 04/14/18  1111     History   Chief Complaint Chief Complaint  Patient presents with  . Abdominal Pain    HPI Austin [name] is a 52 y.o. male.  HPI  Pt was seen at 1130.  Per pt, c/o gradual onset and persistence of constant generalized abd "pain" for the past 4 to 5 days. Has been associated with nausea.  Describes the abd pain as "aching."  LD etoh 6 days ago. Pt states he "ate a steak sandwich" before his pain began. Denies vomiting/diarrhea, no fevers, no back pain, no rash, no CP/SOB, no black or blood in stools.      Past Medical History:  Diagnosis Date  . Alcohol abuse   . Anxiety   . Arthritis   . 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 Elevated LFTs   . Foot and toe(s), blister    bilat feet, blistering between toes  . GERD (gastroesophageal reflux disease)   . Glaucoma   . Headache(784.0)   . Hypertension    hx of  . Insomnia   . Pancreatitis   . UTI (lower urinary tract infection) 08/04/11   finishing abx tomorrow  . Weight loss    25 lb wt loss since 08/2010  . Wrist fracture, right     Patient Active Problem List   Diagnosis Date Noted  . RUQ pain   . Abnormal CT of the abdomen   . 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 ESOPHAGOGASTRODUODENOSCOPY    . ESOPHAGOGASTRODUODENOSCOPY (EGD) WITH PROPOFOL N/A 05/09/2017   Procedure: ESOPHAGOGASTRODUODENOSCOPY (EGD) WITH PROPOFOL;  Surgeon: Doran Stabler, MD;  Location: [facility];  Service: Gastroenterology;  Laterality: N/A;  . EYE SURGERY Left   . EYE SURGERY Right    prostetic eye  . foot srugery     removal of bone near small toe  . FRACTURE SURGERY Right    wrist  . HARDWARE REMOVAL  08/06/2011   Procedure: HARDWARE REMOVAL;  Surgeon: Newt Minion, MD;   Location: [place];  Service: Orthopedics;  Laterality: Right;  Removal Deep Retained Hardware Right Distal Radius  . MULTIPLE TOOTH EXTRACTIONS     for dentures        Home Medications    Prior to Admission medications   Medication Sig Start Date End Date Taking? Authorizing Provider  ketoconazole (NIZORAL) 2 % cream Apply 1 application topically 2 (two) times daily. Patient not taking: Reported on 03/20/2018 06/16/17   Hyatt, Max T, DPM  ondansetron (ZOFRAN) 4 MG tablet Take 1 tablet (4 mg total) by mouth every 6 (six) hours. 03/20/18   Julianne Rice, MD  oxyCODONE (ROXICODONE) 5 MG immediate release tablet Take 1 tablet (5 mg total) by mouth every 6 (six) hours as needed for severe pain. 03/20/18   Julianne Rice, MD  pantoprazole (PROTONIX) 20 MG tablet Take 1 tablet (20 mg total) 2 (two) times daily before a meal by mouth. Patient not taking: Reported on 03/20/2018 04/25/17   Virgel Manifold, MD  sucralfate (CARAFATE) 1 g tablet Take 1 tablet (1 g total) 4 (four) times daily -  with meals and at bedtime by mouth. Patient not taking: Reported on 03/20/2018 04/25/17   Virgel Manifold, MD  terbinafine (LAMISIL)  250 MG tablet Take 1 tablet (250 mg total) by mouth daily. Patient not taking: Reported on 03/20/2018 06/30/17   Garrel Ridgel, DPM    Family History Family History  Problem Relation Age of Onset  . Diabetes Sister   . Diabetes Maternal Aunt        aunt and uncles  . Hypertension Sister        aunt and uncles maternal side  . Colon cancer Neg Hx     Social History Social History   Tobacco Use  . Smoking status: Current Every Day Smoker    Packs/day: 1.00    Types: Cigarettes, Cigars  . Smokeless tobacco: Never Used  . Tobacco comment: 1/2 - 1 cigar/day  Substance Use Topics  . Alcohol use: Yes    Alcohol/week: 7.0 standard drinks    Types: 7 Standard drinks or equivalent per week  . Drug use: Yes    Types: Marijuana    Comment: smokes marijuana 1 to 2 times  daily     Allergies   Patient has no known allergies.   Review of Systems Review of Systems ROS: Statement: All systems negative except as marked or noted in the HPI; Constitutional: Negative for fever and chills. ; ; Eyes: Negative for eye pain, redness and discharge. ; ; ENMT: Negative for ear pain, hoarseness, nasal congestion, sinus pressure and sore throat. ; ; Cardiovascular: Negative for chest pain, palpitations, diaphoresis, dyspnea and peripheral edema. ; ; Respiratory: Negative for cough, wheezing and stridor. ; ; Gastrointestinal: +nausea, abd pain. Negative for vomiting, diarrhea, blood in stool, hematemesis, jaundice and rectal bleeding. . ; ; Genitourinary: Negative for dysuria, flank pain and hematuria. ; ; Musculoskeletal: Negative for back pain and neck pain. Negative for swelling and trauma.; ; Skin: Negative for pruritus, rash, abrasions, blisters, bruising and skin lesion.; ; Neuro: Negative for headache, lightheadedness and neck stiffness. Negative for weakness, altered level of consciousness, altered mental status, extremity weakness, paresthesias, involuntary movement, seizure and syncope.       Physical Exam Updated Vital Signs BP (!) 173/111 (BP Location: Right Arm)   Pulse 61   Resp 14   Ht 5\' 7"  (1.702 m)   Wt 59 kg   SpO2 100%   BMI 20.36 kg/m   Physical Exam 1135: Physical examination:  Nursing notes reviewed; Vital signs and O2 SAT reviewed;  Constitutional: Well developed, Well nourished, Well hydrated, In no acute distress; Head:  Normocephalic, atraumatic; Eyes: EOMI, PERRL, No scleral icterus; ENMT: Mouth and pharynx normal, Mucous membranes moist; Neck: Supple, Full range of motion, No lymphadenopathy; Cardiovascular: Regular rate and rhythm, No gallop; Respiratory: Breath sounds clear & equal bilaterally, No wheezes.  Speaking full sentences with ease, Normal respiratory effort/excursion; Chest: Nontender, Movement normal; Abdomen: Soft, +generalized  tenderness to palp. Nondistended, Normal bowel sounds; Genitourinary: No CVA tenderness; Extremities: Peripheral pulses normal, No tenderness, No edema, No calf edema or asymmetry.; Neuro: AA&Ox3, Blind per hx, otherwise major CN grossly intact.  Speech clear. No gross focal motor deficits in extremities.; Skin: Color normal, Warm, Dry.   ED Treatments / Results  Labs (all labs ordered are listed, but only abnormal results are displayed)   EKG EKG Interpretation  Date/Time:  Friday April 14 2018 11:33:49 EST Ventricular Rate:  61 PR Interval:    QRS Duration: 94 QT Interval:  429 QTC Calculation: 433 R Axis:   29 Text Interpretation:  Sinus rhythm Low voltage, extremity and precordial leads Baseline wander When compared with  ECG of 03/20/2018 No significant change was found Confirmed by Francine Graven [phone]) on 04/14/2018 12:17:32 PM   Radiology   Procedures Procedures (including critical care time)  Medications Ordered in ED Medications  0.9 %  sodium chloride infusion ( Intravenous New Bag/Given 04/14/18 1209)  morphine 4 MG/ML injection 4 mg (4 mg Intravenous Given 04/14/18 1156)  ondansetron (ZOFRAN) injection 4 mg (4 mg Intravenous Given 04/14/18 1156)  famotidine (PEPCID) IVPB 20 mg premix (20 mg Intravenous New Bag/Given 04/14/18 1212)     Initial Impression / Assessment and Plan / ED Course  I have reviewed the triage vital signs and the nursing notes.  Pertinent labs & imaging results that were available during my care of the patient were reviewed by me and considered in my medical decision making (see chart for details).  MDM Reviewed: previous chart, nursing note and vitals Reviewed previous: labs and ECG Interpretation: labs, ECG, x-ray and CT scan    Results for orders placed or performed during the hospital encounter of 04/14/18  Comprehensive metabolic panel  Result Value Ref Range   Sodium 134 (L) 135 - 145 mmol/L   Potassium 3.5 3.5 - 5.1 mmol/L     Chloride 95 (L) 98 - 111 mmol/L   CO2 28 22 - 32 mmol/L   Glucose, Bld 108 (H) 70 - 99 mg/dL   BUN 11 6 - 20 mg/dL   Creatinine, Ser 1.08 0.61 - 1.24 mg/dL   Calcium 9.2 8.9 - 10.3 mg/dL   Total Protein 7.5 6.5 - 8.1 g/dL   Albumin 4.2 3.5 - 5.0 g/dL   AST 27 15 - 41 U/L   ALT 18 0 - 44 U/L   Alkaline Phosphatase 97 38 - 126 U/L   Total Bilirubin 1.1 0.3 - 1.2 mg/dL   GFR calc non Af Amer >60 >60 mL/min   GFR calc Af Amer >60 >60 mL/min   Anion gap 11 5 - 15  Ethanol  Result Value Ref Range   Alcohol, Ethyl (B) <10 <10 mg/dL  Lipase, blood  Result Value Ref Range   Lipase 121 (H) 11 - 51 U/L  Troponin I Once  Result Value Ref Range   Troponin I <0.03 <0.03 ng/mL  CBC with Differential  Result Value Ref Range   WBC 5.3 4.0 - 10.5 K/uL   RBC 4.64 4.22 - 5.81 MIL/uL   Hemoglobin 14.8 13.0 - 17.0 g/dL   HCT 43.3 39.0 - 52.0 %   MCV 93.3 80.0 - 100.0 fL   MCH 31.9 26.0 - 34.0 pg   MCHC 34.2 30.0 - 36.0 g/dL   RDW 13.0 11.5 - 15.5 %   Platelets 147 (L) 150 - 400 K/uL   nRBC 0.0 0.0 - 0.2 %   Neutrophils Relative % 58 %   Neutro Abs 3.1 1.7 - 7.7 K/uL   Lymphocytes Relative 28 %   Lymphs Abs 1.5 0.7 - 4.0 K/uL   Monocytes Relative 12 %   Monocytes Absolute 0.6 0.1 - 1.0 K/uL   Eosinophils Relative 2 %   Eosinophils Absolute 0.1 0.0 - 0.5 K/uL   Basophils Relative 0 %   Basophils Absolute 0.0 0.0 - 0.1 K/uL   Immature Granulocytes 0 %   Abs Immature Granulocytes 0.01 0.00 - 0.07 K/uL  I-stat troponin, ED  Result Value Ref Range   Troponin i, poc 0.00 0.00 - 0.08 ng/mL   Comment 3           Dg Chest  2 View Result Date: 04/14/2018 CLINICAL DATA:  Right-sided abdominal pain radiating to right flank 5 days. Nausea. EXAM: CHEST - 2 VIEW COMPARISON:  08/15/2014 FINDINGS: Lungs are adequately inflated and otherwise clear. Cardiomediastinal silhouette and remainder of the exam is unchanged. IMPRESSION: No active cardiopulmonary disease. Electronically Signed   By: Marin Olp M.D.   On: 04/14/2018 12:40   Ct Abdomen Pelvis W Contrast Result Date: 04/14/2018 CLINICAL DATA:  Right upper quadrant abdominal pain and back pain with nausea. History of pancreatitis EXAM: CT ABDOMEN AND PELVIS WITH CONTRAST TECHNIQUE: Multidetector CT imaging of the abdomen and pelvis was performed using the standard protocol following bolus administration of intravenous contrast. CONTRAST:  192mL ISOVUE-300 IOPAMIDOL (ISOVUE-300) INJECTION 61% COMPARISON:  03/20/2018.  08/13/2017. FINDINGS: Lower chest: Normal Hepatobiliary: Normal Pancreas: Mild swelling and peripancreatic inflammatory change in the region of the uncinate process and head consistent with acute pancreatitis. Findings are similar to the previous study, possibly slightly more pronounced. No evidence of pseudocyst. Spleen: Normal Adrenals/Urinary Tract: Adrenal glands are normal. Kidneys are normal. Bladder is normal. Stomach/Bowel: Ileus pattern.  No focal or primary bowel finding. Vascular/Lymphatic: Aortic atherosclerosis. No aneurysm. IVC is normal. No retroperitoneal adenopathy. Reproductive: Normal Other: Small amount of free fluid in the pelvis. Musculoskeletal: Normal IMPRESSION: Findings consistent with pancreatitis of the uncinate process and head of the pancreas. Findings are slightly more pronounced than on the previous exam. No evidence of pseudocyst or other complication. Electronically Signed   By: Nelson Chimes M.D.   On: 04/14/2018 13:24    1550:  Pt continues to c/o abd pain despite multiple doses of meds for same. Will admit.  T/C returned from [facility] Tyrell Antonio, case discussed, including:  HPI, pertinent PM/SHx, VS/PE, dx testing, ED course and treatment:  Agreeable to admit.      Final Clinical Impressions(s) / ED Diagnoses   Final diagnoses:  None    ED Discharge Orders    None       Francine Graven, DO 04/16/18 1355

## 2018-04-14 NOTE — ED Triage Notes (Signed)
Patient complains of right abdominal pain that radiates into the right flank for 5 days since drinking alcohol. He has also experienced nausea. Though he was told not to take tylenol per his specialist, he did take tylenol for pain this morning. EMS reported fever at 101.

## 2018-04-14 NOTE — ED Notes (Signed)
Bed: DI97 Expected date:  Expected time:  Means of arrival:  Comments: 52 yo abd pain

## 2018-04-14 NOTE — Progress Notes (Signed)
ED TO INPATIENT HANDOFF REPORT  Name/Age/Gender Austin [name] 52 y.o. male  Code Status Code Status History    Date Active Date Inactive Code Status Order ID Comments User Context   08/06/2011 1349 08/07/2011 1732 Full Code [id number]  Roosvelt Harps, RN Inpatient      Home/SNF/Other Home  Chief Complaint abd pain  Level of Care/Admitting Diagnosis ED Disposition    ED Disposition Condition [facility] Area: [facility] [[id number]]  Level of Care: Med-Surg [16]  Diagnosis: Pancreatitis [768115]  Admitting Physician: Elmarie Shiley [phone]  Attending Physician: Niel Hummer A [[id number]]  PT Class (Do Not Modify): Observation [104]  PT Acc Code (Do Not Modify): Observation [10022]       Medical History Past Medical History:  Diagnosis Date  . Alcohol abuse   . Anxiety   . Arthritis   . 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 Elevated LFTs   . Foot and toe(s), blister    bilat feet, blistering between toes  . GERD (gastroesophageal reflux disease)   . Glaucoma   . Headache(784.0)   . Hypertension    hx of  . Insomnia   . Pancreatitis   . UTI (lower urinary tract infection) 08/04/11   finishing abx tomorrow  . Weight loss    25 lb wt loss since 08/2010  . Wrist fracture, right     Allergies No Known Allergies  IV Location/Drains/Wounds Patient Lines/Drains/Airways Status   Active Line/Drains/Airways    Name:   Placement date:   Placement time:   Site:   Days:   Peripheral IV 03/20/18 Right Antecubital   03/20/18    0835    Antecubital   25   Peripheral IV 04/14/18 Right Antecubital   04/14/18    1154    Antecubital   less than 1   Wound 02/11/12 Laceration Head Posterior 1/2 IN LACERATION TO POSTERIOR SCALP   02/11/12    1347    Head   2254          Labs/Imaging Results for orders placed or performed during the hospital encounter of 04/14/18 (from the past 48 hour(s))  Comprehensive metabolic panel      Status: Abnormal   Collection Time: 04/14/18 11:44 AM  Result Value Ref Range   Sodium 134 (L) 135 - 145 mmol/L   Potassium 3.5 3.5 - 5.1 mmol/L   Chloride 95 (L) 98 - 111 mmol/L   CO2 28 22 - 32 mmol/L   Glucose, Bld 108 (H) 70 - 99 mg/dL   BUN 11 6 - 20 mg/dL   Creatinine, Ser 1.08 0.61 - 1.24 mg/dL   Calcium 9.2 8.9 - 10.3 mg/dL   Total Protein 7.5 6.5 - 8.1 g/dL   Albumin 4.2 3.5 - 5.0 g/dL   AST 27 15 - 41 U/L   ALT 18 0 - 44 U/L   Alkaline Phosphatase 97 38 - 126 U/L   Total Bilirubin 1.1 0.3 - 1.2 mg/dL   GFR calc non Af Amer >60 >60 mL/min   GFR calc Af Amer >60 >60 mL/min    Comment: (NOTE) The eGFR has been calculated using the CKD EPI equation. This calculation has not been validated in all clinical situations. eGFR's persistently <60 mL/min signify possible Chronic Kidney Disease.    Anion gap 11 5 - 15    Comment: Performed at [facility], [place] [address]  [place], [place] [address]  Lipase, blood     Status: Abnormal   Collection Time: 04/14/18 11:44 AM  Result Value Ref Range   Lipase 121 (H) 11 - 51 U/L    Comment: Performed at [facility], [place] [address], [place], [place] [address]  Troponin I Once     Status: None   Collection Time: 04/14/18 11:44 AM  Result Value Ref Range   Troponin I <0.03 <0.03 ng/mL    Comment: Performed at [facility], [place] [address], [place], [place] [address]  CBC with Differential     Status: Abnormal   Collection Time: 04/14/18 11:44 AM  Result Value Ref Range   WBC 5.3 4.0 - 10.5 K/uL   RBC 4.64 4.22 - 5.81 MIL/uL   Hemoglobin 14.8 13.0 - 17.0 g/dL   HCT 43.3 39.0 - 52.0 %   MCV 93.3 80.0 - 100.0 fL   MCH 31.9 26.0 - 34.0 pg   MCHC 34.2 30.0 - 36.0 g/dL   RDW 13.0 11.5 - 15.5 %   Platelets 147 (L) 150 - 400 K/uL   nRBC 0.0 0.0 - 0.2 %   Neutrophils Relative % 58 %   Neutro Abs 3.1 1.7 - 7.7 K/uL   Lymphocytes Relative 28 %   Lymphs Abs 1.5 0.7 - 4.0 K/uL    Monocytes Relative 12 %   Monocytes Absolute 0.6 0.1 - 1.0 K/uL   Eosinophils Relative 2 %   Eosinophils Absolute 0.1 0.0 - 0.5 K/uL   Basophils Relative 0 %   Basophils Absolute 0.0 0.0 - 0.1 K/uL   Immature Granulocytes 0 %   Abs Immature Granulocytes 0.01 0.00 - 0.07 K/uL    Comment: Performed at [facility], [place] [address] [place], [place] [address]  Ethanol     Status: None   Collection Time: 04/14/18 11:45 AM  Result Value Ref Range   Alcohol, Ethyl (B) <10 <10 mg/dL    Comment: (NOTE) Lowest detectable limit for serum alcohol is 10 mg/dL. For medical purposes only. Performed at [facility], [place] [address], [place],  [address]   I-stat troponin, ED     Status: None   Collection Time: 04/14/18 11:51 AM  Result Value Ref Range   Troponin i, poc 0.00 0.00 - 0.08 ng/mL   Comment 3            Comment: Due to the release kinetics of cTnI, a negative result within the first hours of the onset of symptoms does not rule out myocardial infarction with certainty. If myocardial infarction is still suspected, repeat the test at appropriate intervals.   Urinalysis, Routine w reflex microscopic     Status: Abnormal   Collection Time: 04/14/18  3:03 PM  Result Value Ref Range   Color, Urine AMBER (A) YELLOW    Comment: BIOCHEMICALS MAY BE AFFECTED BY COLOR   APPearance CLEAR CLEAR   Specific Gravity, Urine 1.032 (H) 1.005 - 1.030   pH 6.0 5.0 - 8.0   Glucose, UA NEGATIVE NEGATIVE mg/dL   Hgb urine dipstick MODERATE (A) NEGATIVE   Bilirubin Urine NEGATIVE NEGATIVE   Ketones, ur 20 (A) NEGATIVE mg/dL   Protein, ur NEGATIVE NEGATIVE mg/dL   Nitrite NEGATIVE NEGATIVE   Leukocytes, UA NEGATIVE NEGATIVE   RBC / HPF 6-10 0 - 5 RBC/hpf   WBC, UA 0-5 0 - 5 WBC/hpf   Bacteria, UA NONE SEEN NONE SEEN   Mucus PRESENT  Comment: Performed at [facility], [place] [address] [place], [place] [address]   Dg Chest 2  View  Result Date: 04/14/2018 CLINICAL DATA:  Right-sided abdominal pain radiating to right flank 5 days. Nausea. EXAM: CHEST - 2 VIEW COMPARISON:  08/15/2014 FINDINGS: Lungs are adequately inflated and otherwise clear. Cardiomediastinal silhouette and remainder of the exam is unchanged. IMPRESSION: No active cardiopulmonary disease. Electronically Signed   By: Marin Olp M.D.   On: 04/14/2018 12:40   Ct Abdomen Pelvis W Contrast  Result Date: 04/14/2018 CLINICAL DATA:  Right upper quadrant abdominal pain and back pain with nausea. History of pancreatitis EXAM: CT ABDOMEN AND PELVIS WITH CONTRAST TECHNIQUE: Multidetector CT imaging of the abdomen and pelvis was performed using the standard protocol following bolus administration of intravenous contrast. CONTRAST:  151m ISOVUE-300 IOPAMIDOL (ISOVUE-300) INJECTION 61% COMPARISON:  03/20/2018.  08/13/2017. FINDINGS: Lower chest: Normal Hepatobiliary: Normal Pancreas: Mild swelling and peripancreatic inflammatory change in the region of the uncinate process and head consistent with acute pancreatitis. Findings are similar to the previous study, possibly slightly more pronounced. No evidence of pseudocyst. Spleen: Normal Adrenals/Urinary Tract: Adrenal glands are normal. Kidneys are normal. Bladder is normal. Stomach/Bowel: Ileus pattern.  No focal or primary bowel finding. Vascular/Lymphatic: Aortic atherosclerosis. No aneurysm. IVC is normal. No retroperitoneal adenopathy. Reproductive: Normal Other: Small amount of free fluid in the pelvis. Musculoskeletal: Normal IMPRESSION: Findings consistent with pancreatitis of the uncinate process and head of the pancreas. Findings are slightly more pronounced than on the previous exam. No evidence of pseudocyst or other complication. Electronically Signed   By: MNelson ChimesM.D.   On: 04/14/2018 13:24    Pending Labs Unresulted Labs (From admission, onward)    Start     Ordered   04/14/18 1136  Urine culture   STAT - ONCE,   STAT     04/14/18 1136   Signed and Held  HIV antibody (Routine Testing)  Once,   R     Signed and Held   Signed and Held  Creatinine, serum  (enoxaparin (LOVENOX)    CrCl >/= 30 ml/min)  Weekly,   R    Comments:  while on enoxaparin therapy    Signed and Held   Signed and Held  Comprehensive metabolic panel  Tomorrow morning,   R     Signed and Held   Signed and Held  CBC  Tomorrow morning,   R     Signed and Held          Vitals/Pain Today's Vitals   04/14/18 1500 04/14/18 1530 04/14/18 1530 04/14/18 1614  BP:  (!) 143/96    Pulse: (!) 58 (!) 59    Resp: 13 15    SpO2: 100% 100%    Weight:      Height:      PainSc:   6  7     Isolation Precautions No active isolations  Medications Medications  0.9 %  sodium chloride infusion ( Intravenous New Bag/Given 04/14/18 1209)  ondansetron (ZOFRAN) injection 4 mg (4 mg Intravenous Given 04/14/18 1156)  iopamidol (ISOVUE-300) 61 % injection (has no administration in time range)  sodium chloride (PF) 0.9 % injection (has no administration in time range)  morphine 2 MG/ML injection 2-4 mg (has no administration in time range)  famotidine (PEPCID) IVPB 20 mg premix (has no administration in time range)  hydrALAZINE (APRESOLINE) injection 10 mg (has no administration in time range)  LORazepam (ATIVAN) tablet 1 mg (  has no administration in time range)    Or  LORazepam (ATIVAN) injection 1 mg (has no administration in time range)  thiamine (VITAMIN B-1) tablet 100 mg (has no administration in time range)    Or  thiamine (B-1) injection 100 mg (has no administration in time range)  folic acid (FOLVITE) tablet 1 mg (has no administration in time range)  multivitamin with minerals tablet 1 tablet (has no administration in time range)  LORazepam (ATIVAN) injection 0-4 mg (has no administration in time range)    Followed by  LORazepam (ATIVAN) injection 0-4 mg (has no administration in time range)  morphine 4 MG/ML  injection 4 mg (4 mg Intravenous Given 04/14/18 1321)  famotidine (PEPCID) IVPB 20 mg premix (0 mg Intravenous Stopped 04/14/18 1242)  iopamidol (ISOVUE-300) 61 % injection 100 mL (100 mLs Intravenous Contrast Given 04/14/18 1306)    Mobility walks

## 2018-04-15 DIAGNOSIS — K859 Acute pancreatitis without necrosis or infection, unspecified: Secondary | ICD-10-CM

## 2018-04-15 LAB — CBC
HEMATOCRIT: 39.9 % (ref 39.0–52.0)
HEMOGLOBIN: 13.1 g/dL (ref 13.0–17.0)
MCH: 31.3 pg (ref 26.0–34.0)
MCHC: 32.8 g/dL (ref 30.0–36.0)
MCV: 95.5 fL (ref 80.0–100.0)
NRBC: 0 % (ref 0.0–0.2)
Platelets: 143 10*3/uL — ABNORMAL LOW (ref 150–400)
RBC: 4.18 MIL/uL — AB (ref 4.22–5.81)
RDW: 13 % (ref 11.5–15.5)
WBC: 4.6 10*3/uL (ref 4.0–10.5)

## 2018-04-15 LAB — COMPREHENSIVE METABOLIC PANEL
ALT: 15 U/L (ref 0–44)
ANION GAP: 9 (ref 5–15)
AST: 21 U/L (ref 15–41)
Albumin: 3.4 g/dL — ABNORMAL LOW (ref 3.5–5.0)
Alkaline Phosphatase: 78 U/L (ref 38–126)
BUN: 10 mg/dL (ref 6–20)
CHLORIDE: 101 mmol/L (ref 98–111)
CO2: 24 mmol/L (ref 22–32)
Calcium: 8.5 mg/dL — ABNORMAL LOW (ref 8.9–10.3)
Creatinine, Ser: 0.99 mg/dL (ref 0.61–1.24)
GFR calc non Af Amer: >60 mL/min (ref >60)
Glucose, Bld: 68 mg/dL — ABNORMAL LOW (ref 70–99)
POTASSIUM: 3.7 mmol/L (ref 3.5–5.1)
Sodium: 134 mmol/L — ABNORMAL LOW (ref 135–145)
Total Bilirubin: 1.2 mg/dL (ref 0.3–1.2)
Total Protein: 6.2 g/dL — ABNORMAL LOW (ref 6.5–8.1)

## 2018-04-15 LAB — URINE CULTURE: Culture: NO GROWTH

## 2018-04-15 LAB — HIV ANTIBODY (ROUTINE TESTING W REFLEX): HIV SCREEN 4TH GENERATION: NONREACTIVE

## 2018-04-15 MED ORDER — CIPROFLOXACIN HCL 0.3 % OP SOLN: 1.0000 [drp] | OPHTHALMIC | 0 refills | Status: DC

## 2018-04-15 MED ORDER — TRAMADOL HCL 50 MG PO TABS: 50.0000 mg | ORAL_TABLET | Freq: Four times a day (QID) | ORAL | Status: DC | PRN

## 2018-04-15 MED ORDER — TRAMADOL HCL 50 MG PO TABS: 50.0000 mg | ORAL_TABLET | Freq: Four times a day (QID) | ORAL | 0 refills | Status: AC | PRN

## 2018-04-15 MED ORDER — ADULT MULTIVITAMIN W/MINERALS CH: 1.0000 | ORAL_TABLET | Freq: Every day | ORAL | 0 refills | Status: DC

## 2018-04-15 MED ORDER — FOLIC ACID 1 MG PO TABS: 1.0000 mg | ORAL_TABLET | Freq: Every day | ORAL | 0 refills | Status: DC

## 2018-04-15 MED ORDER — CIPROFLOXACIN HCL 0.3 % OP SOLN
1.0000 [drp] | OPHTHALMIC | Status: DC
  Administered 2018-04-15 (×2): 1 [drp] via OPHTHALMIC
  Filled 2018-04-15: qty 2.5

## 2018-04-15 NOTE — Discharge Instructions (Signed)
Acute Pancreatitis Acute pancreatitis is a condition in which the pancreas suddenly gets irritated and swollen (has inflammation). The pancreas is a large gland behind the stomach. It makes enzymes that help to digest food. The pancreas also makes hormones that help to control your blood sugar. Acute pancreatitis happens when the enzymes attack the pancreas and damage it. Most attacks last a couple of days and can cause serious problems. Follow these instructions at home: Eating and drinking  Follow instructions from your doctor about diet. You may need to: ? Avoid alcohol. ? Limit how much fat is in your diet.  Eat small meals often. Avoid eating big meals.  Drink enough fluid to keep your pee (urine) clear or pale yellow.  Do not drink alcohol if it caused your condition. General instructions  Take over-the-counter and prescription medicines only as told by your doctor.  Do not use any tobacco products. These include cigarettes, chewing tobacco, and e-cigarettes. If you need help quitting, ask your doctor.  Get plenty of rest.  If directed, check your blood sugar at home as told by your doctor.  Keep all follow-up visits as told by your doctor. This is important. Contact a doctor if:  You do not get better as quickly as expected.  You have new symptoms.  Your symptoms get worse.  You have lasting pain or weakness.  You continue to feel sick to your stomach (nauseous).  You get better and then you have another pain attack.  You have a fever. Get help right away if:  You cannot eat or keep fluids down.  Your pain becomes very bad.  Your skin or the white part of your eyes turns yellow (jaundice).  You throw up (vomit).  You feel dizzy or you pass out (faint).  Your blood sugar is high (over 300 mg/dL). This information is not intended to replace advice given to you by your health care provider. Make sure you discuss any questions you have with your health care  provider. Document Released: 11/10/2007 Document Revised: 10/30/2015 Document Reviewed: 02/25/2015 Elsevier Interactive Patient Education  2018 [facility] full liquid diet for 1 or 2 days, then advance as tolerated to low fat diet

## 2018-04-15 NOTE — Progress Notes (Signed)
Patient complain of 8 out of 10 abdominal pain when returning from bathroom.  Patient had not eaten up to this time.  MD notified via text page.  Pain medication given per MD order.

## 2018-04-15 NOTE — Progress Notes (Signed)
Discharge instructions and medications discussed with patient & fiance, Diane.  AVS and prescriptions given to Diane.  All questions answered.

## 2018-04-15 NOTE — Care Management Note (Signed)
Case Management Note  Patient Details  Name: Austin [name] MRN: [id number] Date of Birth: July 31, 1965  Subjective/Objective:     Alcohol pancreatitis, HTN, ETOH               Action/Plan: NCM spoke to pt and lives at home with fiance. States he works full-time. He is set up with FMLA if needed. He had a [place] Medicaid PCS in the past with [employer]. Has shower chair, RW and cane at home. Pt is inquiring about rails for his bathroom. Explained Medicaid is limited in DME that is covered by insurance. He will need to follow up with his Medicaid CW about catalog of DME covered by insurance. Uses Medicaid transportation/[employer] to get to appts.    Expected Discharge Date:  04/15/18               Expected Discharge Plan:  Home/Self Care  In-House Referral:  NA  Discharge planning Services  CM Consult  Post Acute Care Choice:  NA Choice offered to:  NA  DME Arranged:  N/A DME Agency:  NA  HH Arranged:  NA HH Agency:  NA  Status of Service:  Completed, signed off  If discussed at [place] of Stay Meetings, dates discussed:    Additional Comments:  Erenest Rasher, RN 04/15/2018, 3:31 PM

## 2018-04-15 NOTE — Discharge Summary (Signed)
Physician Discharge Summary  [name] SAY:[id number] DOB: 01/19/1966 DOA: 04/14/2018  PCP: [facility]  Admit date: 04/14/2018 Discharge date: 04/15/2018  Admitted From: Home  Disposition:  Home   Recommendations for Outpatient Follow-up:  1. Follow up with PCP in 1-2 weeks 2. Please obtain BMP/CBC in one week 3. Counseling regarding alcohol use.   Home Health; none  Discharge Condition: stable.  CODE STATUS: full code.  Diet recommendation: full liquid diet, advance as tolerated.   Brief/Interim Summary: HPI: Austin [name] is a 52 y.o. male with medical history significant of alcoholic pancreatitis, blindness, alcohol abuse who presents complaining of abdominal pain, sharp in quality, intermittent, 8/10, epigastrium radiates to back. He relates pain started 5 days prior to admission. Last alcohol drink was 5 days ago. He has not been able to eat due to pain. He report nausea. No vomiting. Last BM 5 days ago.    ED Course: patient presents with abdominal pain, he has received morphine and fentanyl. His pain is not controlled. Sodium 134, lipase 121, alcohol level less than 5, CT abdomen and pelvis; Findings consistent with pancreatitis of the uncinate process and head of the pancreas. Findings are slightly more pronounced than on the previous exam. No evidence of pseudocyst or other complication.   1-Alcoholic Pancreatitis;  Presents with abdominal pain, nausea, not oral intake. CT abdomen: Findings consistent with pancreatitis of the uncinate process and head of the pancreas. Findings are slightly more pronounced than on the previous exam. No evidence of pseudocyst or other complication. He report pain has improved. Only had some pain on walking to bathroom.  He tolerated full liquid. He wants to go home. Feels he will do ok on oral pain medications. He will eat full liquid diet and advance as tolerated to low fat diet   2-HTN; resume Norvasc at discharge.   IV PRN Hydralazine.   3-Alcohol abuse;  CIWA Protocol.  No evidence of withdrawal symptoms.   4-Mild Hyponatremia; stable.     Discharge Diagnoses:  Active Problems:   Alcoholic pancreatitis   Essential hypertension   Blindness of both eyes   Alcohol use   Hyponatremia    Discharge Instructions  Discharge Instructions    Increase activity slowly   Complete by:  As directed    Increase activity slowly   Complete by:  As directed      Allergies as of 04/15/2018   No Known Allergies     Medication List    STOP taking these medications   naltrexone 50 MG tablet Commonly known as:  DEPADE   oxyCODONE 5 MG immediate release tablet Commonly known as:  Oxy IR/ROXICODONE   terbinafine 250 MG tablet Commonly known as:  LAMISIL     TAKE these medications   amLODipine 5 MG tablet Commonly known as:  NORVASC Take 5 mg by mouth daily.   ciprofloxacin 0.3 % ophthalmic solution Commonly known as:  CILOXAN Place 1 drop into both eyes every 4 (four) hours while awake. Administer 1 drop, every 2 hours, while awake, for 2 days. Then 1 drop, every 4 hours, while awake, for the next 5 days.   folic acid 1 MG tablet Commonly known as:  FOLVITE Take 1 tablet (1 mg total) by mouth daily. Start taking on:  04/16/2018   mirtazapine 15 MG tablet Commonly known as:  REMERON Take 15 mg by mouth at bedtime.   multivitamin with minerals Tabs tablet Take 1 tablet by mouth daily. Start  taking on:  04/16/2018   omeprazole 40 MG capsule Commonly known as:  PRILOSEC Take 40 mg by mouth daily.   traMADol 50 MG tablet Commonly known as:  ULTRAM Take 1 tablet (50 mg total) by mouth every 6 (six) hours as needed for up to 4 days for moderate pain.       No Known Allergies  Consultations:  none   Procedures/Studies: Dg Chest 2 View  Result Date: 04/14/2018 CLINICAL DATA:  Right-sided abdominal pain radiating to right flank 5 days. Nausea. EXAM: CHEST - 2 VIEW  COMPARISON:  08/15/2014 FINDINGS: Lungs are adequately inflated and otherwise clear. Cardiomediastinal silhouette and remainder of the exam is unchanged. IMPRESSION: No active cardiopulmonary disease. Electronically Signed   By: Marin Olp M.D.   On: 04/14/2018 12:40   Ct Abdomen Pelvis W Contrast  Result Date: 04/14/2018 CLINICAL DATA:  Right upper quadrant abdominal pain and back pain with nausea. History of pancreatitis EXAM: CT ABDOMEN AND PELVIS WITH CONTRAST TECHNIQUE: Multidetector CT imaging of the abdomen and pelvis was performed using the standard protocol following bolus administration of intravenous contrast. CONTRAST:  134mL ISOVUE-300 IOPAMIDOL (ISOVUE-300) INJECTION 61% COMPARISON:  03/20/2018.  08/13/2017. FINDINGS: Lower chest: Normal Hepatobiliary: Normal Pancreas: Mild swelling and peripancreatic inflammatory change in the region of the uncinate process and head consistent with acute pancreatitis. Findings are similar to the previous study, possibly slightly more pronounced. No evidence of pseudocyst. Spleen: Normal Adrenals/Urinary Tract: Adrenal glands are normal. Kidneys are normal. Bladder is normal. Stomach/Bowel: Ileus pattern.  No focal or primary bowel finding. Vascular/Lymphatic: Aortic atherosclerosis. No aneurysm. IVC is normal. No retroperitoneal adenopathy. Reproductive: Normal Other: Small amount of free fluid in the pelvis. Musculoskeletal: Normal IMPRESSION: Findings consistent with pancreatitis of the uncinate process and head of the pancreas. Findings are slightly more pronounced than on the previous exam. No evidence of pseudocyst or other complication. Electronically Signed   By: Nelson Chimes M.D.   On: 04/14/2018 13:24   Ct Abdomen Pelvis W Contrast  Result Date: 03/20/2018 CLINICAL DATA:  52 year old male with right upper quadrant abdominal pain for 3 days. History of pancreatitis. Initial encounter. EXAM: CT ABDOMEN AND PELVIS WITH CONTRAST TECHNIQUE:  Multidetector CT imaging of the abdomen and pelvis was performed using the standard protocol following bolus administration of intravenous contrast. CONTRAST:  118mL ISOVUE-300 IOPAMIDOL (ISOVUE-300) INJECTION 61% COMPARISON:  08/13/2017 CT. FINDINGS: Lower chest: Bibasilar subsegmental atelectasis. Heart size within normal limits. Hepatobiliary: Mild fatty infiltration liver. No worrisome hepatic lesion noted. No calcified gallstones. Pancreas: Hazy infiltration surrounds pancreatic head and uncinate process. No obstructing stone or mass identified. Spleen: No mass or enlargement. Adrenals/Urinary Tract: No obstructing stone or hydronephrosis. No worrisome renal or adrenal mass. Noncontrast filled views the urinary bladder with slight impression upon the bladder base by prostate gland otherwise negative. Stomach/Bowel: Circumferential thickening gastric pylorus. Inflammatory process surrounds the proximal thi duodenum. Vascular/Lymphatic: Atherosclerotic changes aorta without aortic aneurysm or large vessel occlusion. Scattered normal size lymph nodes. Reproductive: Slightly lobulated prostate gland causes slight impression upon the bladder base. Other: No free air. Small amount of free fluid right pericolic gutter and within the pelvis. Small periumbilical hernia. No bowel containing hernia. Musculoskeletal: Mild degenerative changes lower lumbar spine. Minimal lucencies of the ilium unchanged. IMPRESSION: 1. Mild inflammatory process centered at the pancreatic head/neck junction with inflamed appearing gastric antrum and proximal duodenum. Small amount of free fluid. Findings may reflect changes of pancreatitis as versus primary antritis/duodenitis with secondary involvement of the pancreatic  head/uncinate process. 2. Mild fatty infiltration of the liver. 3.  Aortic Atherosclerosis (ICD10-I70.0). 4. Slightly lobulated prostate gland with mild impression upon the bladder base. Electronically Signed   By: Genia Del M.D.   On: 03/20/2018 10:56     Subjective: Feeling better, wants to go home.   Discharge Exam: Vitals:   04/15/18 1125 04/15/18 1151  BP: 136/75   Pulse: 74   Resp: 18   Temp: 98.6 F (37 C)   SpO2: 100% 98%   Vitals:   04/14/18 1721 04/15/18 0450 04/15/18 1125 04/15/18 1151  BP: (!) 150/100 122/73 136/75   Pulse: (!) 58 70 74   Resp: 16 18 18    Temp: 99 F (37.2 C) 98.4 F (36.9 C) 98.6 F (37 C)   TempSrc: Oral Oral Oral   SpO2: 100% 98% 100% 98%  Weight:      Height:        General: Pt is alert, awake, not in acute distress Cardiovascular: RRR, S1/S2 +, no rubs, no gallops Respiratory: CTA bilaterally, no wheezing, no rhonchi Abdominal: Soft, no tenderness, ND, bowel sounds + Extremities: no edema, no cyanosis    The results of significant diagnostics from this hospitalization (including imaging, microbiology, ancillary and laboratory) are listed below for reference.     Microbiology: No results found for this or any previous visit (from the past 240 hour(s)).   Labs: BNP (last 3 results) No results for input(s): BNP in the last 8760 hours. Basic Metabolic Panel: Recent Labs  Lab 04/14/18 1144 04/15/18 0516  NA 134* 134*  K 3.5 3.7  CL 95* 101  CO2 28 24  GLUCOSE 108* 68*  BUN 11 10  CREATININE 1.08 0.99  CALCIUM 9.2 8.5*   Liver Function Tests: Recent Labs  Lab 04/14/18 1144 04/15/18 0516  AST 27 21  ALT 18 15  ALKPHOS 97 78  BILITOT 1.1 1.2  PROT 7.5 6.2*  ALBUMIN 4.2 3.4*   Recent Labs  Lab 04/14/18 1144  LIPASE 121*   No results for input(s): AMMONIA in the last 168 hours. CBC: Recent Labs  Lab 04/14/18 1144 04/15/18 0516  WBC 5.3 4.6  NEUTROABS 3.1  --   HGB 14.8 13.1  HCT 43.3 39.9  MCV 93.3 95.5  PLT 147* 143*   Cardiac Enzymes: Recent Labs  Lab 04/14/18 1144  TROPONINI <0.03   BNP: Invalid input(s): POCBNP CBG: No results for input(s): GLUCAP in the last 168 hours. D-Dimer No results for  input(s): DDIMER in the last 72 hours. Hgb A1c No results for input(s): HGBA1C in the last 72 hours. Lipid Profile No results for input(s): CHOL, HDL, LDLCALC, TRIG, CHOLHDL, LDLDIRECT in the last 72 hours. Thyroid function studies No results for input(s): TSH, T4TOTAL, T3FREE, THYROIDAB in the last 72 hours.  Invalid input(s): FREET3 Anemia work up No results for input(s): VITAMINB12, FOLATE, FERRITIN, TIBC, IRON, RETICCTPCT in the last 72 hours. Urinalysis    Component Value Date/Time   COLORURINE AMBER (A) 04/14/2018 1503   APPEARANCEUR CLEAR 04/14/2018 1503   LABSPEC 1.032 (H) 04/14/2018 1503   PHURINE 6.0 04/14/2018 1503   GLUCOSEU NEGATIVE 04/14/2018 1503   HGBUR MODERATE (A) 04/14/2018 1503   BILIRUBINUR NEGATIVE 04/14/2018 1503   KETONESUR 20 (A) 04/14/2018 1503   PROTEINUR NEGATIVE 04/14/2018 1503   UROBILINOGEN 2.0 (H) 09/13/2016 1039   NITRITE NEGATIVE 04/14/2018 1503   LEUKOCYTESUR NEGATIVE 04/14/2018 1503   Sepsis Labs Invalid input(s): PROCALCITONIN,  WBC,  LACTICIDVEN Microbiology No  results found for this or any previous visit (from the past 240 hour(s)).   Time coordinating discharge: 35 minutes.   SIGNED:   Elmarie Shiley, MD  [facility] 04/15/2018, 1:41 PM Pager   If 7PM-7AM, please contact night-coverage [email or web address] Password [email or web address]

## 2018-05-25 ENCOUNTER — Ambulatory Visit (INDEPENDENT_AMBULATORY_CARE_PROVIDER_SITE_OTHER): Payer: Medicare Other | Admitting: Podiatry

## 2018-05-25 ENCOUNTER — Encounter: Payer: Self-pay | Admitting: Podiatry

## 2018-05-25 DIAGNOSIS — B351 Tinea unguium: Secondary | ICD-10-CM | POA: Diagnosis not present

## 2018-05-25 DIAGNOSIS — M79676 Pain in unspecified toe(s): Secondary | ICD-10-CM

## 2018-05-25 DIAGNOSIS — Q828 Other specified congenital malformations of skin: Secondary | ICD-10-CM

## 2018-05-25 DIAGNOSIS — B353 Tinea pedis: Secondary | ICD-10-CM

## 2018-05-25 MED ORDER — TERBINAFINE HCL 250 MG PO TABS: 250.0000 mg | ORAL_TABLET | Freq: Every day | ORAL | 0 refills | Status: DC

## 2018-05-25 NOTE — Progress Notes (Signed)
He presents today chief complaint of painful elongated toenails with calluses.  He also has a rash between his toes once again.  Objective: Vital signs are stable he is alert and oriented x3.  There is no erythema edema cellulitis drainage or odor there he does have a rash between his toes which is associated with tinea pedis.  Toenails are long thick yellow dystrophic clinically mycotic and painful on palpation and debridement.  Assessment: Tinea pedis bilateral.  Onychomycosis painful in nature.  Plan: Debridement of toenails bilateral.  Started him on terbinafine 250 mg tablets 1 p.o. daily follow-up with him in 2 months.

## 2018-08-24 ENCOUNTER — Ambulatory Visit: Payer: Medicare Other | Admitting: Podiatry

## 2018-08-31 ENCOUNTER — Other Ambulatory Visit: Payer: Self-pay | Admitting: Podiatry

## 2018-11-20 ENCOUNTER — Telehealth: Payer: Self-pay | Admitting: Podiatry

## 2018-11-20 NOTE — Telephone Encounter (Signed)
Patient called and says he needs a note for work saying her can not work on his feet. Still has issues with Callus and pain. Was unable to take the July appt I offered him. Please call and inform if he can pick up a note for this.

## 2018-11-20 NOTE — Telephone Encounter (Signed)
Unable to leave message on mobile phone number is invalid or disconnected.

## 2018-11-20 NOTE — Telephone Encounter (Signed)
Pt states his work is requesting a note for him to be on seated duty. I told pt Dr. Milinda Pointer had not seen him in 6 months and he would need to be evaluated for the ankle pain, I would write a note to allow him to perform seated duties until he was reevaluated in our office by Dr. Milinda Pointer. Pt states he will get the fax number from his supervisor.

## 2018-11-21 ENCOUNTER — Telehealth: Payer: Self-pay | Admitting: Podiatry

## 2018-11-21 NOTE — Telephone Encounter (Signed)
Faxed letter of 11/20/2018 to Attn: HR Department, and mailed copy of letter to pt.

## 2018-11-21 NOTE — Telephone Encounter (Signed)
Answered in a different phone message.

## 2018-11-21 NOTE — Telephone Encounter (Signed)
Patient called: The fax number to send his work note is [phone] [place] [employer]. Also please mail a copy of the letter to him at his home.

## 2018-11-29 IMAGING — CR DG CHEST 2V
2 series · 2 of 2 positions shown · non-contrast
Comparison: 08/15/2014

CLINICAL DATA: Right-sided abdominal pain radiating to right flank
5 days. Nausea.

EXAM:
CHEST - 2 VIEW

[w chest lat]
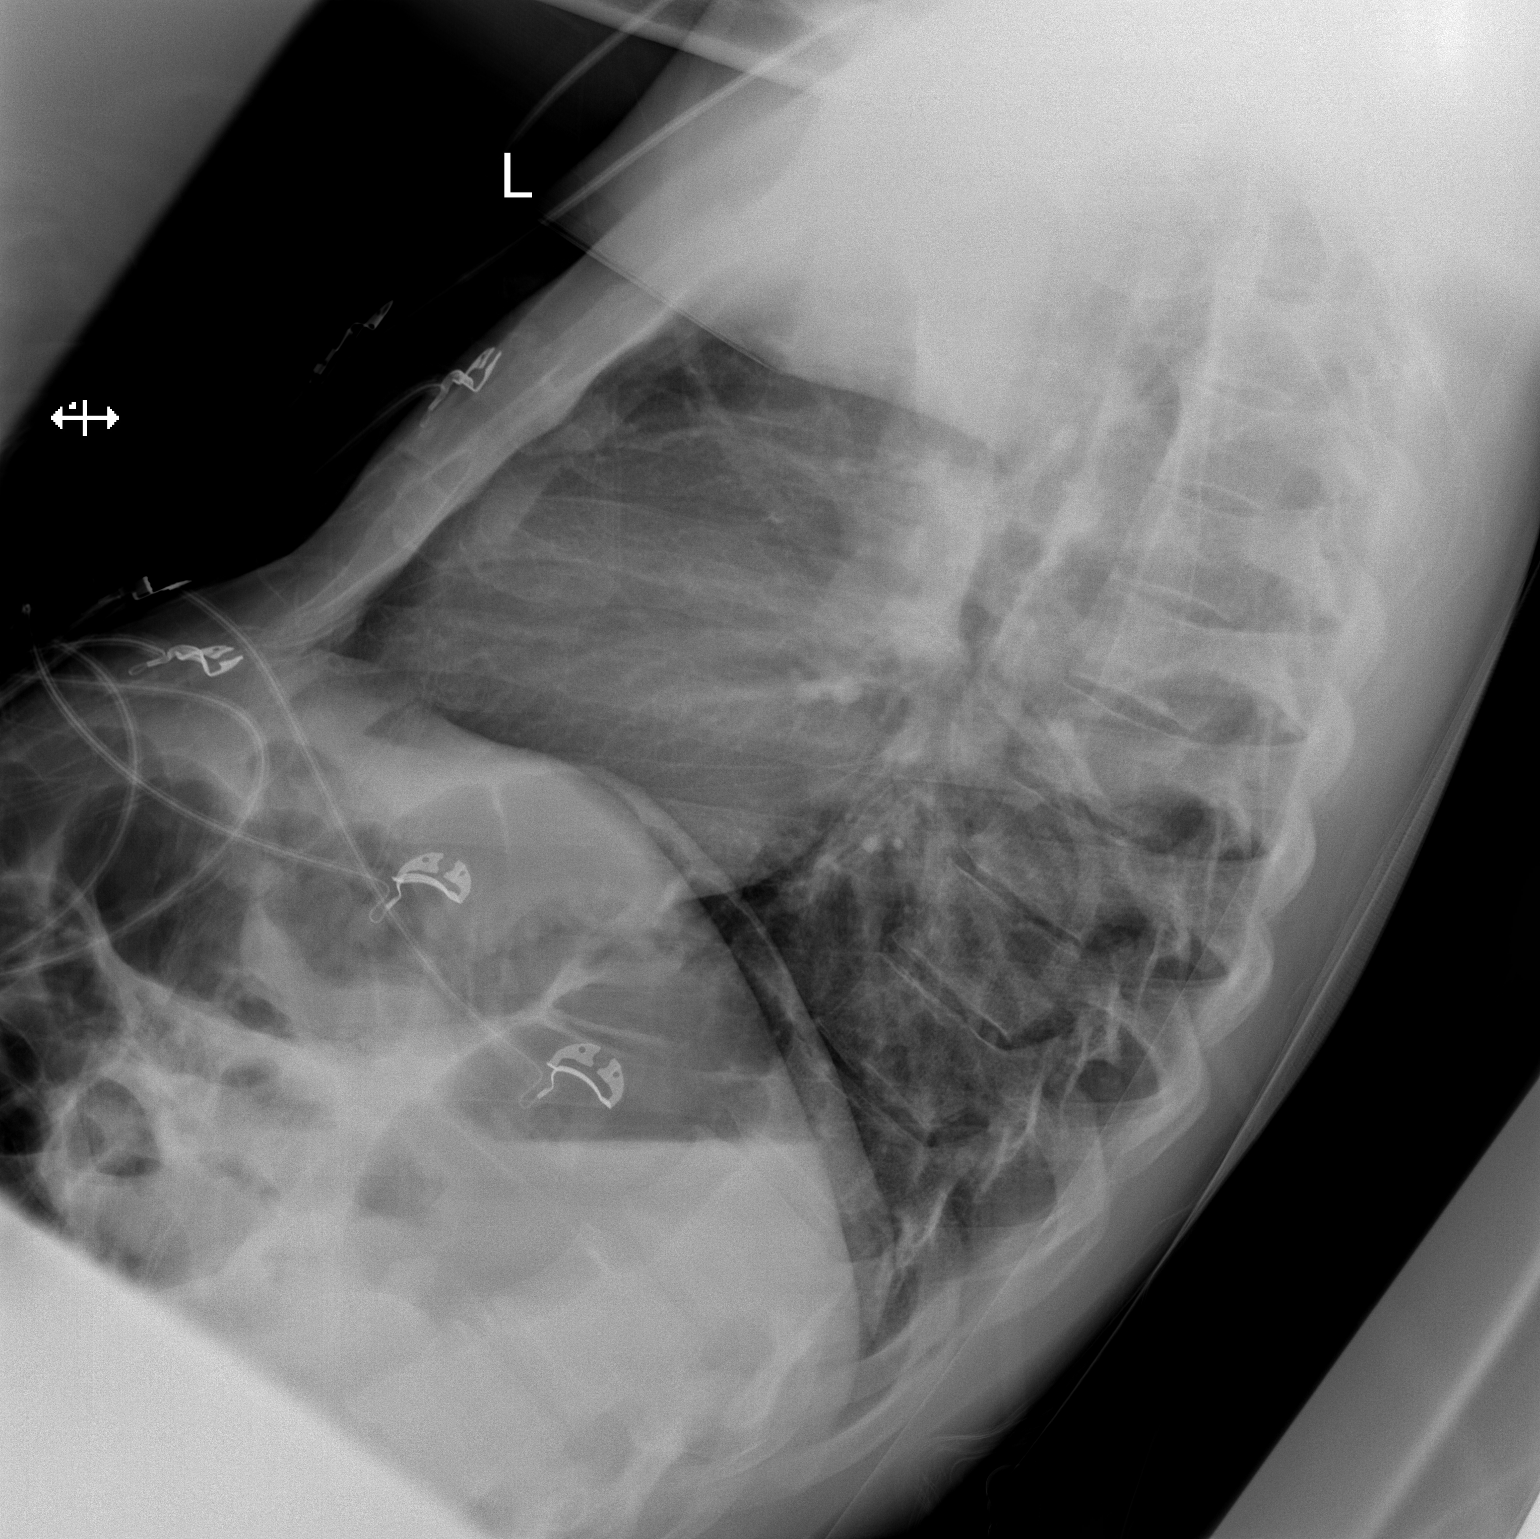

[x chest ap]
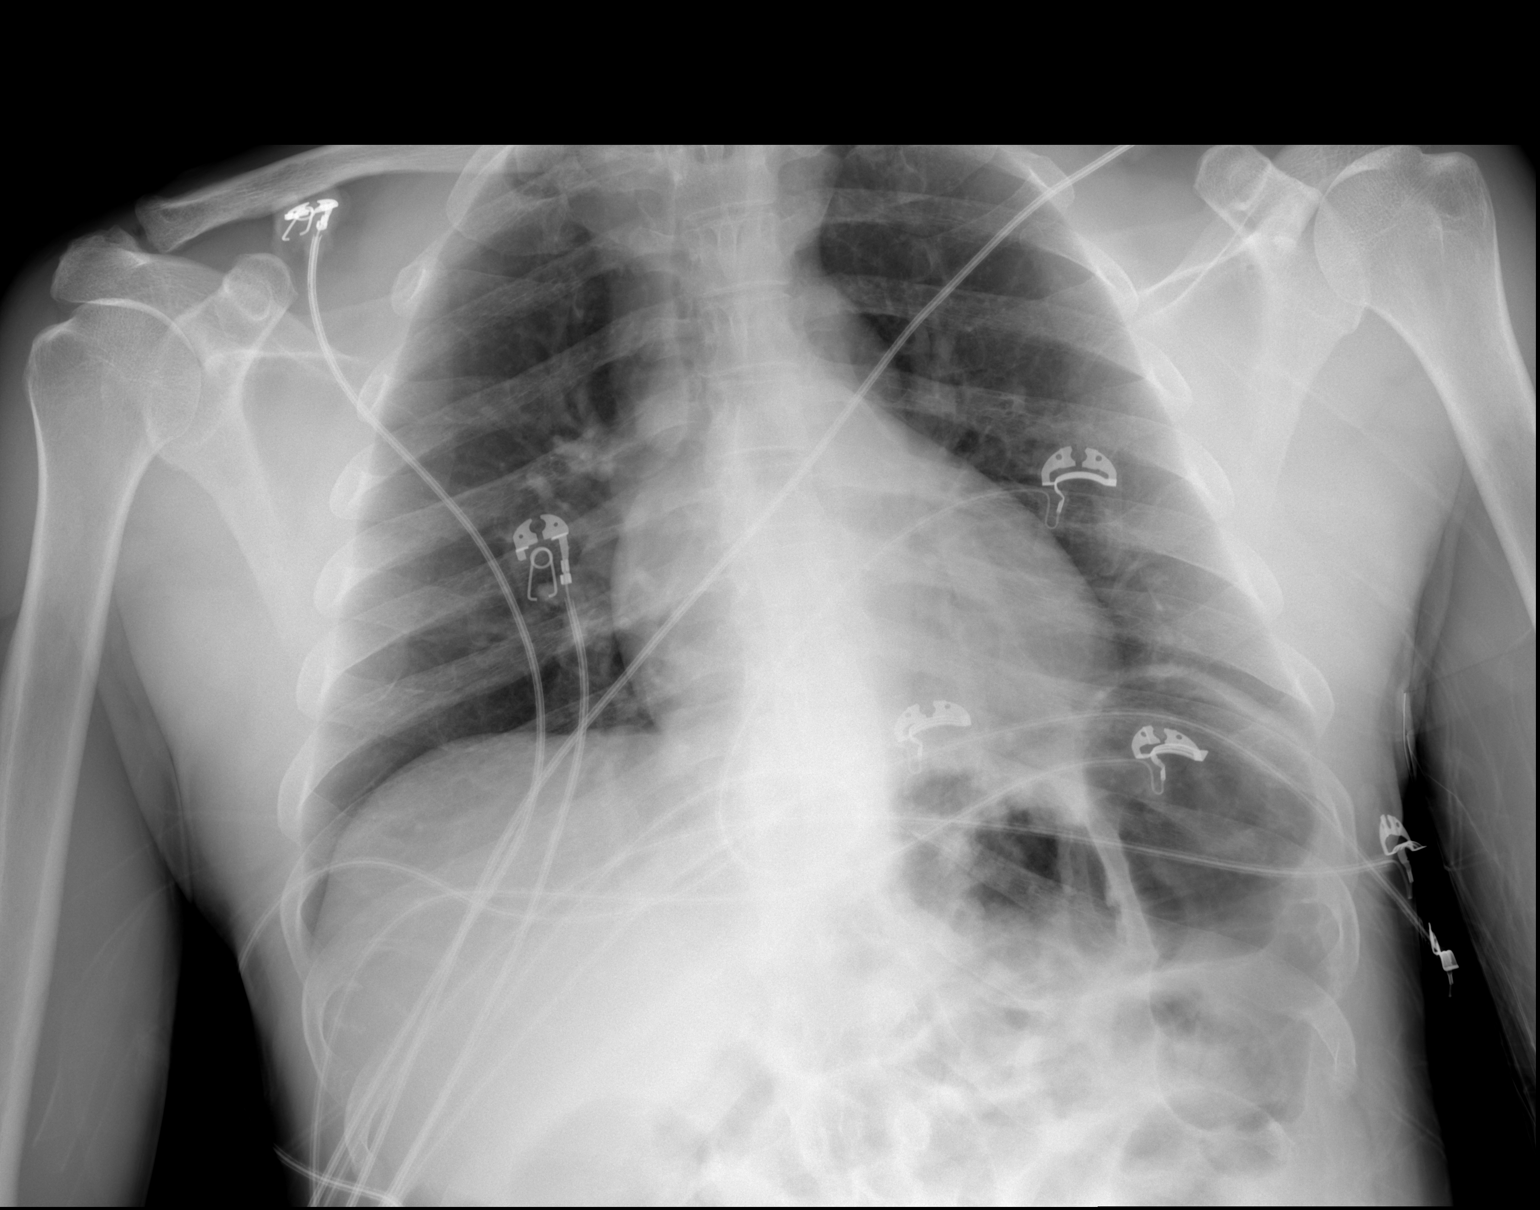

[2 of 2 positions shown; findings below may reference images not displayed]

FINDINGS: Lungs are adequately inflated and otherwise clear. Cardiomediastinal
silhouette and remainder of the exam is unchanged.
IMPRESSION: No active cardiopulmonary disease.

## 2018-12-26 ENCOUNTER — Encounter: Payer: Self-pay | Admitting: Podiatry

## 2018-12-26 ENCOUNTER — Ambulatory Visit (INDEPENDENT_AMBULATORY_CARE_PROVIDER_SITE_OTHER): Payer: Medicare Other | Admitting: Podiatry

## 2018-12-26 ENCOUNTER — Other Ambulatory Visit: Payer: Self-pay

## 2018-12-26 VITALS — Temp 97.9°F

## 2018-12-26 DIAGNOSIS — Q828 Other specified congenital malformations of skin: Secondary | ICD-10-CM | POA: Diagnosis not present

## 2018-12-26 DIAGNOSIS — M79676 Pain in unspecified toe(s): Secondary | ICD-10-CM

## 2018-12-26 DIAGNOSIS — B351 Tinea unguium: Secondary | ICD-10-CM

## 2018-12-26 NOTE — Progress Notes (Signed)
He presents today for chief complaint of painfully elongated toenails with calluses.  He has chronic ankle pain that he just deals with.  Objective: Vital signs are stable he is alert and oriented x3.  Pulses are palpable.  Rate reactive porokeratotic lesions plantar aspect of bilateral foot toenails are thick yellow dystrophic onychomycotic painfully elongated.  Assessment: Pain secondary to onychomycosis ankle pain and reactive hyperkeratotic tissue.  Plan: Debrided all reactive hyperkeratotic tissue debrided toenails 1 through 5 bilaterally.  Wrote a note so that he can sit for work due to the ankle pain.  I will follow-up with him on an as-needed basis.

## 2018-12-27 ENCOUNTER — Encounter (HOSPITAL_COMMUNITY): Payer: Self-pay | Admitting: Obstetrics and Gynecology

## 2018-12-27 ENCOUNTER — Other Ambulatory Visit: Payer: Self-pay

## 2018-12-27 ENCOUNTER — Emergency Department (HOSPITAL_COMMUNITY): Payer: Medicare Other

## 2018-12-27 ENCOUNTER — Emergency Department (HOSPITAL_COMMUNITY)
Admission: EM | Admit: 2018-12-27 | Discharge: 2018-12-27 | Disposition: A | Discharge status: Home / Self Care | Payer: Medicare Other | Source: Home / Self Care | Attending: Emergency Medicine | Admitting: Emergency Medicine

## 2018-12-27 DIAGNOSIS — M791 Myalgia, unspecified site: Secondary | ICD-10-CM | POA: Insufficient documentation

## 2018-12-27 DIAGNOSIS — Z79899 Other long term (current) drug therapy: Secondary | ICD-10-CM | POA: Insufficient documentation

## 2018-12-27 DIAGNOSIS — I10 Essential (primary) hypertension: Secondary | ICD-10-CM | POA: Insufficient documentation

## 2018-12-27 DIAGNOSIS — R112 Nausea with vomiting, unspecified: Secondary | ICD-10-CM

## 2018-12-27 DIAGNOSIS — Z20828 Contact with and (suspected) exposure to other viral communicable diseases: Secondary | ICD-10-CM | POA: Insufficient documentation

## 2018-12-27 DIAGNOSIS — R51 Headache: Secondary | ICD-10-CM | POA: Insufficient documentation

## 2018-12-27 DIAGNOSIS — R197 Diarrhea, unspecified: Secondary | ICD-10-CM | POA: Insufficient documentation

## 2018-12-27 DIAGNOSIS — F17210 Nicotine dependence, cigarettes, uncomplicated: Secondary | ICD-10-CM | POA: Insufficient documentation

## 2018-12-27 DIAGNOSIS — H548 Legal blindness, as defined in USA: Secondary | ICD-10-CM | POA: Insufficient documentation

## 2018-12-27 LAB — CBC WITH DIFFERENTIAL/PLATELET
Abs Immature Granulocytes: 0.01 10*3/uL (ref 0.00–0.07)
Basophils Absolute: 0 10*3/uL (ref 0.0–0.1)
Basophils Relative: 1 %
Eosinophils Absolute: 0.2 10*3/uL (ref 0.0–0.5)
Eosinophils Relative: 3 %
HCT: 37.9 % — ABNORMAL LOW (ref 39.0–52.0)
Hemoglobin: 13.2 g/dL (ref 13.0–17.0)
Immature Granulocytes: 0 %
Lymphocytes Relative: 41 %
Lymphs Abs: 2.7 10*3/uL (ref 0.7–4.0)
MCH: 32.1 pg (ref 26.0–34.0)
MCHC: 34.8 g/dL (ref 30.0–36.0)
MCV: 92.2 fL (ref 80.0–100.0)
Monocytes Absolute: 0.7 10*3/uL (ref 0.1–1.0)
Monocytes Relative: 10 %
Neutro Abs: 2.9 10*3/uL (ref 1.7–7.7)
Neutrophils Relative %: 45 %
Platelets: 162 10*3/uL (ref 150–400)
RBC: 4.11 MIL/uL — ABNORMAL LOW (ref 4.22–5.81)
RDW: 13.3 % (ref 11.5–15.5)
WBC: 6.5 10*3/uL (ref 4.0–10.5)
nRBC: 0 % (ref 0.0–0.2)

## 2018-12-27 LAB — COMPREHENSIVE METABOLIC PANEL
ALT: 19 U/L (ref 0–44)
AST: 24 U/L (ref 15–41)
Albumin: 3.7 g/dL (ref 3.5–5.0)
Alkaline Phosphatase: 83 U/L (ref 38–126)
Anion gap: 11 (ref 5–15)
BUN: 12 mg/dL (ref 6–20)
CO2: 25 mmol/L (ref 22–32)
Calcium: 8.5 mg/dL — ABNORMAL LOW (ref 8.9–10.3)
Chloride: 100 mmol/L (ref 98–111)
Creatinine, Ser: 0.99 mg/dL (ref 0.61–1.24)
GFR calc Af Amer: >60 mL/min (ref >60)
GFR calc non Af Amer: >60 mL/min (ref >60)
Glucose, Bld: 85 mg/dL (ref 70–99)
Potassium: 3.8 mmol/L (ref 3.5–5.1)
Sodium: 136 mmol/L (ref 135–145)
Total Bilirubin: 0.5 mg/dL (ref 0.3–1.2)
Total Protein: 6.8 g/dL (ref 6.5–8.1)

## 2018-12-27 LAB — LIPASE, BLOOD: Lipase: 51 U/L (ref 11–51)

## 2018-12-27 MED ORDER — ONDANSETRON HCL 4 MG/2ML IJ SOLN
4.0000 mg | Freq: Once | INTRAMUSCULAR | Status: AC
  Administered 2018-12-27: 4 mg via INTRAVENOUS
  Filled 2018-12-27: qty 2

## 2018-12-27 MED ORDER — FENTANYL CITRATE (PF) 100 MCG/2ML IJ SOLN
50.0000 ug | Freq: Once | INTRAMUSCULAR | Status: AC
  Administered 2018-12-27: 50 ug via INTRAVENOUS
  Filled 2018-12-27: qty 2

## 2018-12-27 MED ORDER — SODIUM CHLORIDE 0.9 % IV BOLUS
1000.0000 mL | Freq: Once | INTRAVENOUS | Status: AC
  Administered 2018-12-27: 1000 mL via INTRAVENOUS

## 2018-12-27 NOTE — ED Notes (Signed)
Patient reports people at his job have been sent home due to be positive to for [place]. Pt is unsure if he was exposed.

## 2018-12-27 NOTE — Discharge Instructions (Signed)
COVID test pending.  If positive, you will be notified.  Tylenol for fever.  Increase fluids.  Off work through Sunday.

## 2018-12-27 NOTE — ED Triage Notes (Signed)
Pt reports COVID like symptoms. Pt reports loose stool, generalized body pain, back pain, and headaches.

## 2018-12-27 NOTE — ED Provider Notes (Signed)
[facility] DEPT Provider Note   CSN: [id number] Arrival date & time: 12/27/18  1457    History   Chief Complaint Chief Complaint  Patient presents with  . Headache  . Back Pain  . Generalized Body Aches    HPI Austin [name] is a 53 y.o. male.     Nausea, vomiting, diarrhea with associated general body achiness and headache for 36 hours.  People at the job site have been diagnosed with [place].  He is legally blind.  No stiff neck, neuro deficits, obvious cough, dysuria.  Severity of symptoms is mild to moderate.  Nothing makes symptoms better or worse.     Past Medical History:  Diagnosis Date  . Alcohol abuse   . Anxiety   . Arthritis   . 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 Elevated LFTs   . Foot and toe(s), blister    bilat feet, blistering between toes  . GERD (gastroesophageal reflux disease)   . Glaucoma   . Headache(784.0)   . Hypertension    hx of  . Insomnia   . Pancreatitis   . UTI (lower urinary tract infection) 08/04/11   finishing abx tomorrow  . Weight loss    25 lb wt loss since 08/2010  . Wrist fracture, right     Patient Active Problem List   Diagnosis Date Noted  . Alcoholic pancreatitis 40/98/1191  . Essential hypertension 04/14/2018  . Blindness of both eyes 04/14/2018  . Alcohol use 04/14/2018  . Hyponatremia 04/14/2018  . RUQ pain   . Abnormal CT of the abdomen   . 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 ESOPHAGOGASTRODUODENOSCOPY    . ESOPHAGOGASTRODUODENOSCOPY (EGD) WITH PROPOFOL N/A 05/09/2017   Procedure: ESOPHAGOGASTRODUODENOSCOPY (EGD) WITH PROPOFOL;  Surgeon: Doran Stabler, MD;  Location: [facility];  Service: Gastroenterology;  Laterality: N/A;  . EYE SURGERY Left   . EYE SURGERY Right    prostetic eye  . foot srugery     removal of bone near small toe   . FRACTURE SURGERY Right    wrist  . HARDWARE REMOVAL  08/06/2011   Procedure: HARDWARE REMOVAL;  Surgeon: Newt Minion, MD;  Location: [facility];  Service: Orthopedics;  Laterality: Right;  Removal Deep Retained Hardware Right Distal Radius  . MULTIPLE TOOTH EXTRACTIONS     for dentures        Home Medications    Prior to Admission medications   Medication Sig Start Date End Date Taking? Authorizing Provider  amLODipine (NORVASC) 5 MG tablet Take 5 mg by mouth daily.    [provider]  ciprofloxacin (CILOXAN) 0.3 % ophthalmic solution Place 1 drop into both eyes every 4 (four) hours while awake. Administer 1 drop, every 2 hours, while awake, for 2 days. Then 1 drop, every 4 hours, while awake, for the next 5 days. 04/15/18   Regalado, Belkys A, MD  folic acid (FOLVITE) 1 MG tablet Take 1 tablet (1 mg total) by mouth daily. 04/16/18   Regalado, Belkys A, MD  mirtazapine (REMERON) 15 MG tablet Take 15 mg by mouth at bedtime.    [provider]  Multiple Vitamin (MULTIVITAMIN WITH MINERALS) TABS tablet Take 1 tablet by mouth daily. 04/16/18   Regalado, Belkys A, MD  omeprazole (PRILOSEC) 40 MG capsule Take 40 mg  by mouth daily.    [provider]  terbinafine (LAMISIL) 250 MG tablet Take 1 tablet (250 mg total) by mouth daily. 05/25/18   Hyatt, Max T, DPM    Family History Family History  Problem Relation Age of Onset  . Diabetes Sister   . Diabetes Maternal Aunt        aunt and uncles  . Hypertension Sister        aunt and uncles maternal side  . Colon cancer Neg Hx     Social History Social History   Tobacco Use  . Smoking status: Current Every Day Smoker    Packs/day: 1.00    Types: Cigarettes, Cigars  . Smokeless tobacco: Never Used  . Tobacco comment: 1/2 - 1 cigar/day  Substance Use Topics  . Alcohol use: Yes    Alcohol/week: 7.0 standard drinks    Types: 7 Standard drinks or equivalent per week  . Drug use: Yes    Types: Marijuana     Comment: smokes marijuana 1 to 2 times daily     Allergies   Patient has no known allergies.   Review of Systems Review of Systems  All other systems reviewed and are negative.    Physical Exam Updated Vital Signs BP (!) 151/98   Pulse 77   Temp 99.3 F (37.4 C) (Oral)   Resp 16   Ht 5\' 7"  (1.702 m)   Wt 68 kg   SpO2 99%   BMI 23.49 kg/m   Physical Exam Vitals signs and nursing note reviewed.  Constitutional:      Appearance: He is well-developed.     Comments: nad  HENT:     Head: Normocephalic and atraumatic.  Eyes:     Conjunctiva/sclera: Conjunctivae normal.     Comments: Legally blind  Neck:     Musculoskeletal: Neck supple.  Cardiovascular:     Rate and Rhythm: Normal rate and regular rhythm.  Pulmonary:     Effort: Pulmonary effort is normal.     Breath sounds: Normal breath sounds.  Abdominal:     General: Bowel sounds are normal.     Palpations: Abdomen is soft.     Comments: Nontender  Musculoskeletal: Normal range of motion.  Skin:    General: Skin is warm and dry.  Neurological:     Mental Status: He is alert and oriented to person, place, and time.  Psychiatric:        Behavior: Behavior normal.      ED Treatments / Results  Labs (all labs ordered are listed, but only abnormal results are displayed) Labs Reviewed  CBC WITH DIFFERENTIAL/PLATELET - Abnormal; Notable for the following components:      Result Value   RBC 4.11 (*)    HCT 37.9 (*)    All other components within normal limits  COMPREHENSIVE METABOLIC PANEL - Abnormal; Notable for the following components:   Calcium 8.5 (*)    All other components within normal limits  NOVEL CORONAVIRUS, NAA (HOSPITAL ORDER, SEND-OUT TO REF LAB)  LIPASE, BLOOD  URINALYSIS, ROUTINE W REFLEX MICROSCOPIC    EKG None  Radiology Dg Chest Port 1 View  Result Date: 12/27/2018 CLINICAL DATA:  Weakness and recent COVID-19 exposure EXAM: PORTABLE CHEST 1 VIEW COMPARISON:  04/14/2018  FINDINGS: The heart size and mediastinal contours are within normal limits. Both lungs are clear. The visualized skeletal structures are unremarkable. IMPRESSION: No active disease. Electronically Signed   By: Inez Catalina M.D.   On:  12/27/2018 18:08    Procedures Procedures (including critical care time)  Medications Ordered in ED Medications  sodium chloride 0.9 % bolus 1,000 mL (0 mLs Intravenous Stopped 12/27/18 1846)  ondansetron (ZOFRAN) injection 4 mg (4 mg Intravenous Given 12/27/18 1738)  fentaNYL (SUBLIMAZE) injection 50 mcg (50 mcg Intravenous Given 12/27/18 1850)  ondansetron (ZOFRAN) injection 4 mg (4 mg Intravenous Given 12/27/18 1850)     Initial Impression / Assessment and Plan / ED Course  I have reviewed the triage vital signs and the nursing notes.  Pertinent labs & imaging results that were available during my care of the patient were reviewed by me and considered in my medical decision making (see chart for details).        Patient presents with nausea, vomiting, diarrhea.  IV fluids, basic labs.  Will check for COVID.  Patient rechecked prior to discharge.  He is hemodynamically stable.  COVID test pending.  Off work through Sunday.   Austin [name] was evaluated in Emergency Department on 12/27/2018 for the symptoms described in the history of present illness. He was evaluated in the context of the global COVID-19 pandemic, which necessitated consideration that the patient might be at risk for infection with the SARS-CoV-2 virus that causes COVID-19. Institutional protocols and algorithms that pertain to the evaluation of patients at risk for COVID-19 are in a state of rapid change based on information released by regulatory bodies including the CDC and federal and state organizations. These policies and algorithms were followed during the patient's care in the ED. Final Clinical Impressions(s) / ED Diagnoses   Final diagnoses:  Nausea vomiting and diarrhea    ED  Discharge Orders    None       Nat Christen, MD 12/27/18 2007

## 2018-12-29 ENCOUNTER — Inpatient Hospital Stay (HOSPITAL_COMMUNITY)
Admission: EM | Admit: 2018-12-29 | Discharge: 2018-12-31 | DRG: 440 | Disposition: A | Discharge status: Home / Self Care | Payer: Medicare Other | Attending: Internal Medicine | Admitting: Internal Medicine

## 2018-12-29 ENCOUNTER — Encounter (HOSPITAL_COMMUNITY): Payer: Self-pay

## 2018-12-29 ENCOUNTER — Observation Stay (HOSPITAL_COMMUNITY): Payer: Medicare Other

## 2018-12-29 ENCOUNTER — Other Ambulatory Visit: Payer: Self-pay

## 2018-12-29 DIAGNOSIS — K219 Gastro-esophageal reflux disease without esophagitis: Secondary | ICD-10-CM | POA: Diagnosis present

## 2018-12-29 DIAGNOSIS — Z20828 Contact with and (suspected) exposure to other viral communicable diseases: Secondary | ICD-10-CM | POA: Diagnosis present

## 2018-12-29 DIAGNOSIS — I10 Essential (primary) hypertension: Secondary | ICD-10-CM | POA: Diagnosis present

## 2018-12-29 DIAGNOSIS — K852 Alcohol induced acute pancreatitis without necrosis or infection: Principal | ICD-10-CM

## 2018-12-29 DIAGNOSIS — F17210 Nicotine dependence, cigarettes, uncomplicated: Secondary | ICD-10-CM | POA: Diagnosis present

## 2018-12-29 DIAGNOSIS — H548 Legal blindness, as defined in USA: Secondary | ICD-10-CM | POA: Diagnosis present

## 2018-12-29 DIAGNOSIS — H543 Unqualified visual loss, both eyes: Secondary | ICD-10-CM | POA: Diagnosis present

## 2018-12-29 DIAGNOSIS — R112 Nausea with vomiting, unspecified: Secondary | ICD-10-CM | POA: Diagnosis present

## 2018-12-29 DIAGNOSIS — K851 Biliary acute pancreatitis without necrosis or infection: Secondary | ICD-10-CM

## 2018-12-29 DIAGNOSIS — K860 Alcohol-induced chronic pancreatitis: Secondary | ICD-10-CM | POA: Diagnosis present

## 2018-12-29 DIAGNOSIS — F1020 Alcohol dependence, uncomplicated: Secondary | ICD-10-CM | POA: Diagnosis present

## 2018-12-29 DIAGNOSIS — K2920 Alcoholic gastritis without bleeding: Secondary | ICD-10-CM | POA: Diagnosis present

## 2018-12-29 LAB — CBC WITH DIFFERENTIAL/PLATELET
Abs Immature Granulocytes: 0.02 10*3/uL (ref 0.00–0.07)
Basophils Absolute: 0 10*3/uL (ref 0.0–0.1)
Basophils Relative: 1 %
Eosinophils Absolute: 0.2 10*3/uL (ref 0.0–0.5)
Eosinophils Relative: 3 %
HCT: 42.3 % (ref 39.0–52.0)
Hemoglobin: 14.5 g/dL (ref 13.0–17.0)
Immature Granulocytes: 0 %
Lymphocytes Relative: 30 %
Lymphs Abs: 1.7 10*3/uL (ref 0.7–4.0)
MCH: 32.2 pg (ref 26.0–34.0)
MCHC: 34.3 g/dL (ref 30.0–36.0)
MCV: 93.8 fL (ref 80.0–100.0)
Monocytes Absolute: 0.6 10*3/uL (ref 0.1–1.0)
Monocytes Relative: 12 %
Neutro Abs: 3.1 10*3/uL (ref 1.7–7.7)
Neutrophils Relative %: 54 %
Platelets: 154 10*3/uL (ref 150–400)
RBC: 4.51 MIL/uL (ref 4.22–5.81)
RDW: 13 % (ref 11.5–15.5)
WBC: 5.6 10*3/uL (ref 4.0–10.5)
nRBC: 0 % (ref 0.0–0.2)

## 2018-12-29 LAB — ETHANOL: Alcohol, Ethyl (B): <10 mg/dL (ref <10)

## 2018-12-29 LAB — COMPREHENSIVE METABOLIC PANEL
ALT: 17 U/L (ref 0–44)
AST: 25 U/L (ref 15–41)
Albumin: 4.2 g/dL (ref 3.5–5.0)
Alkaline Phosphatase: 94 U/L (ref 38–126)
Anion gap: 10 (ref 5–15)
BUN: 9 mg/dL (ref 6–20)
CO2: 26 mmol/L (ref 22–32)
Calcium: 9.1 mg/dL (ref 8.9–10.3)
Chloride: 101 mmol/L (ref 98–111)
Creatinine, Ser: 1.16 mg/dL (ref 0.61–1.24)
GFR calc Af Amer: >60 mL/min (ref >60)
GFR calc non Af Amer: >60 mL/min (ref >60)
Glucose, Bld: 93 mg/dL (ref 70–99)
Potassium: 3.8 mmol/L (ref 3.5–5.1)
Sodium: 137 mmol/L (ref 135–145)
Total Bilirubin: 1 mg/dL (ref 0.3–1.2)
Total Protein: 7.6 g/dL (ref 6.5–8.1)

## 2018-12-29 LAB — NOVEL CORONAVIRUS, NAA (HOSP ORDER, SEND-OUT TO REF LAB; TAT 18-24 HRS): SARS-CoV-2, NAA: NOT DETECTED

## 2018-12-29 LAB — LACTIC ACID, PLASMA: Lactic Acid, Venous: 0.8 mmol/L (ref 0.5–1.9)

## 2018-12-29 LAB — LIPASE, BLOOD: Lipase: 56 U/L — ABNORMAL HIGH (ref 11–51)

## 2018-12-29 LAB — SARS CORONAVIRUS 2 BY RT PCR (HOSPITAL ORDER, PERFORMED IN ~~LOC~~ HOSPITAL LAB): SARS Coronavirus 2: NEGATIVE

## 2018-12-29 MED ORDER — ENOXAPARIN SODIUM 40 MG/0.4ML ~~LOC~~ SOLN
40.0000 mg | SUBCUTANEOUS | Status: DC
  Administered 2018-12-29 – 2018-12-30 (×2): 40 mg via SUBCUTANEOUS
  Filled 2018-12-29 (×2): qty 0.4

## 2018-12-29 MED ORDER — FOLIC ACID 1 MG PO TABS
1.0000 mg | ORAL_TABLET | Freq: Every day | ORAL | Status: DC
  Administered 2018-12-30 – 2018-12-31 (×2): 1 mg via ORAL
  Filled 2018-12-29 (×2): qty 1

## 2018-12-29 MED ORDER — SODIUM CHLORIDE 0.9 % IV BOLUS (SEPSIS)
1000.0000 mL | Freq: Once | INTRAVENOUS | Status: AC
  Administered 2018-12-29: 05:00:00 1000 mL via INTRAVENOUS

## 2018-12-29 MED ORDER — HYDROMORPHONE HCL 1 MG/ML IJ SOLN
1.0000 mg | Freq: Once | INTRAMUSCULAR | Status: AC
  Administered 2018-12-29: 07:00:00 1 mg via INTRAVENOUS
  Filled 2018-12-29: qty 1

## 2018-12-29 MED ORDER — SODIUM CHLORIDE 0.9 % IV SOLN
INTRAVENOUS | Status: DC
  Administered 2018-12-29 – 2018-12-31 (×3): via INTRAVENOUS

## 2018-12-29 MED ORDER — AMLODIPINE BESYLATE 5 MG PO TABS
5.0000 mg | ORAL_TABLET | Freq: Every day | ORAL | Status: DC
  Administered 2018-12-29 – 2018-12-31 (×3): 5 mg via ORAL
  Filled 2018-12-29 (×3): qty 1

## 2018-12-29 MED ORDER — ONDANSETRON HCL 4 MG PO TABS: 4.0000 mg | ORAL_TABLET | Freq: Four times a day (QID) | ORAL | Status: DC | PRN

## 2018-12-29 MED ORDER — MIRTAZAPINE 15 MG PO TABS
15.0000 mg | ORAL_TABLET | Freq: Every day | ORAL | Status: DC
  Administered 2018-12-29 – 2018-12-30 (×2): 15 mg via ORAL
  Filled 2018-12-29 (×2): qty 1

## 2018-12-29 MED ORDER — ADULT MULTIVITAMIN W/MINERALS CH
1.0000 | ORAL_TABLET | Freq: Every day | ORAL | Status: DC
  Administered 2018-12-30 – 2018-12-31 (×2): 1 via ORAL
  Filled 2018-12-29 (×2): qty 1

## 2018-12-29 MED ORDER — MORPHINE SULFATE (PF) 2 MG/ML IV SOLN
2.0000 mg | INTRAVENOUS | Status: DC | PRN
  Administered 2018-12-29 – 2018-12-30 (×12): 2 mg via INTRAVENOUS
  Filled 2018-12-29 (×13): qty 1

## 2018-12-29 MED ORDER — ONDANSETRON HCL 4 MG/2ML IJ SOLN: 4.0000 mg | Freq: Four times a day (QID) | INTRAMUSCULAR | Status: DC | PRN

## 2018-12-29 MED ORDER — FENTANYL CITRATE (PF) 100 MCG/2ML IJ SOLN
100.0000 ug | Freq: Once | INTRAMUSCULAR | Status: AC
  Administered 2018-12-29: 06:00:00 100 ug via INTRAVENOUS
  Filled 2018-12-29: qty 2

## 2018-12-29 MED ORDER — FAMOTIDINE IN NACL 20-0.9 MG/50ML-% IV SOLN
20.0000 mg | Freq: Two times a day (BID) | INTRAVENOUS | Status: DC
  Administered 2018-12-29 – 2018-12-31 (×5): 20 mg via INTRAVENOUS
  Filled 2018-12-29 (×5): qty 50

## 2018-12-29 MED ORDER — ONDANSETRON HCL 4 MG/2ML IJ SOLN
4.0000 mg | Freq: Once | INTRAMUSCULAR | Status: AC
  Administered 2018-12-29: 05:00:00 4 mg via INTRAVENOUS
  Filled 2018-12-29: qty 2

## 2018-12-29 MED ORDER — SODIUM CHLORIDE 0.9 % IV BOLUS (SEPSIS)
1000.0000 mL | Freq: Once | INTRAVENOUS | Status: AC
  Administered 2018-12-29: 07:00:00 1000 mL via INTRAVENOUS

## 2018-12-29 MED ORDER — ONDANSETRON HCL 4 MG/2ML IJ SOLN
4.0000 mg | Freq: Once | INTRAMUSCULAR | Status: AC
  Administered 2018-12-29: 4 mg via INTRAVENOUS
  Filled 2018-12-29: qty 2

## 2018-12-29 MED ORDER — HYDROMORPHONE HCL 1 MG/ML IJ SOLN
1.0000 mg | Freq: Once | INTRAMUSCULAR | Status: AC
  Administered 2018-12-29: 05:00:00 1 mg via INTRAVENOUS
  Filled 2018-12-29: qty 1

## 2018-12-29 NOTE — H&P (Signed)
History and Physical    Austin [name] ZOX:[id number] DOB: [birth year] DOA: 12/29/2018  PCP: [facility]  Patient coming from: home   I have personally briefly reviewed patient's old medical records available.   Chief Complaint: Nausea vomiting, epigastric pain.  HPI: Austin [name] is a 53 y.o. male with medical history significant of chronic recurrent pancreatitis, hypertension, blindness due to glaucoma who presents to the emergency room with nausea, unable to eat any food, right upper quadrant, right flank and midepigastric pain for about 3 days.  According to the patient, this is insidious in onset, progressively worse, he is not able to eat anything since Monday, anything that he eats exacerbates it and he gets nauseated, he has normal bowel movement.  Nothing relieving his pain.  Now he is getting more pain along the retrosternal.  These episodes sound like his pancreatitis episodes.  He does drink 1 or 2 cans of beer a day and last drink was yesterday morning. Denies any fever chills.  Denies any chest pain or shortness of breath.  No cough cold or flulike symptoms. ED Course: Hemodynamically stable.  Blood pressures are stable.  WBC count is normal.  Electrolytes including liver function tests are normal.  Lipase is 56, however he has chronic pancreatitis.  Previous CT scans and ultrasounds on the records do not show any gallbladder disease. Patient was given IV fluids, pain medications, nausea medications with persistent symptoms so admission was advised.  Review of Systems: all systems are reviewed and pertinent positive as per HPI otherwise rest are negative.    Past Medical History:  Diagnosis Date  . Alcohol abuse   . Anxiety   . Arthritis   . 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 Elevated LFTs   . Foot and toe(s), blister    bilat feet, blistering between toes  . GERD (gastroesophageal reflux disease)   . Glaucoma   . Headache(784.0)   .  Hypertension    hx of  . Insomnia   . Pancreatitis   . UTI (lower urinary tract infection) 08/04/11   finishing abx tomorrow  . Weight loss    25 lb wt loss since 08/2010  . Wrist fracture, right     Past Surgical History:  Procedure Laterality Date  . ESOPHAGOGASTRODUODENOSCOPY    . ESOPHAGOGASTRODUODENOSCOPY (EGD) WITH PROPOFOL N/A 05/09/2017   Procedure: ESOPHAGOGASTRODUODENOSCOPY (EGD) WITH PROPOFOL;  Surgeon: Doran Stabler, MD;  Location: [facility];  Service: Gastroenterology;  Laterality: N/A;  . EYE SURGERY Left   . EYE SURGERY Right    prostetic eye  . foot srugery     removal of bone near small toe  . FRACTURE SURGERY Right    wrist  . HARDWARE REMOVAL  08/06/2011   Procedure: HARDWARE REMOVAL;  Surgeon: Newt Minion, MD;  Location: [facility];  Service: Orthopedics;  Laterality: Right;  Removal Deep Retained Hardware Right Distal Radius  . MULTIPLE TOOTH EXTRACTIONS     for dentures     reports that he has been smoking cigarettes and cigars. He has been smoking about 1.00 pack per day. He has never used smokeless tobacco. He reports current alcohol use of about 7.0 standard drinks of alcohol per week. He reports current drug use. Drug: Marijuana.  No Known Allergies  Family History  Problem Relation Age of Onset  . Diabetes Sister   . Diabetes Maternal Aunt  aunt and uncles  . Hypertension Sister        aunt and uncles maternal side  . Colon cancer Neg Hx      Prior to Admission medications   Medication Sig Start Date End Date Taking? Authorizing Provider  amLODipine (NORVASC) 5 MG tablet Take 5 mg by mouth daily.    [provider]  ciprofloxacin (CILOXAN) 0.3 % ophthalmic solution Place 1 drop into both eyes every 4 (four) hours while awake. Administer 1 drop, every 2 hours, while awake, for 2 days. Then 1 drop, every 4 hours, while awake, for the next 5 days. 04/15/18   Regalado, Belkys A, MD  folic acid (FOLVITE) 1 MG tablet Take 1 tablet (1  mg total) by mouth daily. 04/16/18   Regalado, Belkys A, MD  mirtazapine (REMERON) 15 MG tablet Take 15 mg by mouth at bedtime.    [provider]  Multiple Vitamin (MULTIVITAMIN WITH MINERALS) TABS tablet Take 1 tablet by mouth daily. 04/16/18   Regalado, Belkys A, MD  omeprazole (PRILOSEC) 40 MG capsule Take 40 mg by mouth daily.    [provider]  terbinafine (LAMISIL) 250 MG tablet Take 1 tablet (250 mg total) by mouth daily. 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  Both with some secretions but no evidence of infection. ENMT: Mucous membranes are moist. Posterior pharynx clear of any exudate or lesions.Normal dentition.  Neck: normal, supple, no masses, no thyromegaly Respiratory: clear to auscultation bilaterally, no wheezing, no crackles. Normal respiratory effort. No accessory muscle use.  Cardiovascular: Regular rate and rhythm, no murmurs / rubs / gallops. No extremity edema. 2+ pedal pulses. No carotid bruits.  Abdomen: Moderate tenderness along the right upper quadrant and epigastrium, no rigidity or guarding, No hepatosplenomegaly. Bowel sounds positive.  Musculoskeletal: no clubbing / cyanosis. No joint deformity upper and lower extremities. Good ROM, no contractures. Normal muscle tone.  Skin: no rashes, lesions, ulcers. No induration Neurologic: CN 2-12 grossly intact.  Sensation intact, DTR normal. Strength 5/5 in all 4.  Psychiatric: Normal judgment and insight. Alert and oriented x 3. Normal mood.     Labs on Admission: I have personally reviewed following labs and imaging studies  CBC: Recent Labs  Lab 12/27/18 1716 12/29/18 0456  WBC 6.5 5.6  NEUTROABS 2.9 3.1  HGB 13.2 14.5  HCT 37.9* 42.3  MCV 92.2 93.8  PLT 162 914   Basic Metabolic Panel: Recent Labs  Lab 12/27/18 1716 12/29/18 0456  NA 136 137  K 3.8 3.8  CL 100 101  CO2 25 26  GLUCOSE 85 93  BUN 12 9  CREATININE 0.99 1.16  CALCIUM 8.5* 9.1   GFR: Estimated Creatinine Clearance: 68.9 mL/min (by C-G formula based on SCr of 1.16 mg/dL). Liver Function Tests: Recent Labs  Lab 12/27/18 1716 12/29/18 0456  AST 24 25  ALT 19 17  ALKPHOS 83 94  BILITOT 0.5 1.0  PROT 6.8 7.6  ALBUMIN 3.7 4.2   Recent Labs  Lab 12/27/18 1716 12/29/18 0456  LIPASE 51 56*   No results for input(s): AMMONIA in the last 168 hours. Coagulation Profile: No results for input(s): INR, PROTIME in the last 168 hours. Cardiac Enzymes: No results for input(s): CKTOTAL, CKMB, CKMBINDEX, TROPONINI in the last 168 hours. BNP (last 3 results) No results for input(s): PROBNP in the last 8760 hours. HbA1C: No results for input(s): HGBA1C in the last 72 hours. CBG: No results for input(s): GLUCAP in the last 168 hours. Lipid Profile: No results for input(s): CHOL, HDL, LDLCALC, TRIG, CHOLHDL, LDLDIRECT in the last 72 hours. Thyroid Function Tests: No results for input(s): TSH, T4TOTAL, FREET4, T3FREE, THYROIDAB in the last 72 hours. Anemia Panel: No results for input(s): VITAMINB12, FOLATE, FERRITIN, TIBC, IRON, RETICCTPCT in the last 72 hours. Urine analysis:    Component Value Date/Time   COLORURINE AMBER (A) 04/14/2018 1503   APPEARANCEUR CLEAR 04/14/2018 1503   LABSPEC 1.032 (H) 04/14/2018 1503   PHURINE 6.0 04/14/2018 1503   GLUCOSEU NEGATIVE 04/14/2018 1503   HGBUR MODERATE (A)  04/14/2018 1503   BILIRUBINUR NEGATIVE 04/14/2018 1503   KETONESUR 20 (A) 04/14/2018 1503   PROTEINUR NEGATIVE 04/14/2018 1503   UROBILINOGEN 2.0 (H) 09/13/2016 1039   NITRITE NEGATIVE 04/14/2018 1503   LEUKOCYTESUR NEGATIVE 04/14/2018 1503    Radiological Exams on Admission: Dg Chest Port 1 View  Result Date: 12/27/2018 CLINICAL DATA:  Weakness and recent COVID-19 exposure EXAM: PORTABLE CHEST 1 VIEW COMPARISON:  04/14/2018 FINDINGS: The heart size and mediastinal contours are within normal limits. Both lungs are clear. The visualized skeletal structures are unremarkable. IMPRESSION: No active disease. Electronically Signed   By: Inez Catalina M.D.   On: 12/27/2018 18:08    EKG: Independently reviewed.  Sinus rhythm, no ST-T wave changes.  QTC is normal.  Assessment/Plan Principal Problem:   Intractable nausea and vomiting Active Problems:   Alcoholic pancreatitis   Essential hypertension   Blindness of both eyes     1.  Epigastric and right upper quadrant abdominal pain: Suspect acute on chronic pancreatitis or alcohol induced gastritis. Agree with monitoring the hospital given severity of symptoms.  IV fluids, will challenge with clears liquid diet, adequate pain medications intravenous and oral opiates are needed, nausea medications.  Keep monitoring electrolytes and liver function test with lipase in the morning.  Will put patient on twice a day Pepcid through IV as his symptoms also significant for reflux.  2.  Alcoholism: Patient is stated 1 or 2 beer a day with no history of withdrawals.  Will put patient on multivitamins.  Patient looks fairly stable with no evidence of withdrawal, holding off on starting any benzodiazepines.  Will need close monitoring.  3.  Essential hypertension: Blood pressures are stable.  He can resume amlodipine.  4.  Disability with both eye blindness: Patient lives alone.  He needs to have good symptom control before he goes home independent.    DVT prophylaxis: Lovenox subcu Code Status: Full code Family Communication: None Disposition Plan: Observation.  Home after hospitalization Consults called: None Admission status: Observation.   Barb Merino  MD [facility] Pager [phone]  If 7PM-7AM, please contact night-coverage [email or web address] Password [facility]  12/29/2018, 8:27 AM

## 2018-12-29 NOTE — ED Triage Notes (Signed)
Pt arrives via [facility] for RUQ pain that radiates to the back, loose stools, and nausea. Pt was seen two days prior for same, Covid-19 results pending.

## 2018-12-29 NOTE — ED Notes (Signed)
ED TO INPATIENT HANDOFF REPORT  ED Nurse Name and Phone #: Jarrett Soho, RN   S Name/Age/Gender Austin [name] 53 y.o. male Room/Bed: WA14/WA14  Code Status   Code Status: Prior  Home/SNF/Other Home Patient oriented to: self, place, time and situation Is this baseline? Yes   Triage Complete: Triage complete  Chief Complaint Abdominal Pain Diarrhea  Triage Note Pt arrives via [facility] for RUQ pain that radiates to the back, loose stools, and nausea. Pt was seen two days prior for same, Covid-19 results pending.     Allergies No Known Allergies  Level of Care/Admitting Diagnosis ED Disposition    ED Disposition Condition [facility] Area: [place] [100102]  Level of Care: Med-Surg [16]  Covid Evaluation: Asymptomatic Screening Protocol (No Symptoms)  Diagnosis: Intractable nausea and vomiting [665993]  Admitting Physician: Barb Merino [[id number]]  Attending Physician: Barb Merino [[id number]]  PT Class (Do Not Modify): Observation [104]  PT Acc Code (Do Not Modify): Observation [10022]       B Medical/Surgery History Past Medical History:  Diagnosis Date  . Alcohol abuse   . Anxiety   . Arthritis   . 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 Elevated LFTs   . Foot and toe(s), blister    bilat feet, blistering between toes  . GERD (gastroesophageal reflux disease)   . Glaucoma   . Headache(784.0)   . Hypertension    hx of  . Insomnia   . Pancreatitis   . UTI (lower urinary tract infection) 08/04/11   finishing abx tomorrow  . Weight loss    25 lb wt loss since 08/2010  . Wrist fracture, right    Past Surgical History:  Procedure Laterality Date  . ESOPHAGOGASTRODUODENOSCOPY    . ESOPHAGOGASTRODUODENOSCOPY (EGD) WITH PROPOFOL N/A 05/09/2017   Procedure: ESOPHAGOGASTRODUODENOSCOPY (EGD) WITH PROPOFOL;  Surgeon: Doran Stabler, MD;  Location: [facility];  Service: Gastroenterology;  Laterality: N/A;  . EYE  SURGERY Left   . EYE SURGERY Right    prostetic eye  . foot srugery     removal of bone near small toe  . FRACTURE SURGERY Right    wrist  . HARDWARE REMOVAL  08/06/2011   Procedure: HARDWARE REMOVAL;  Surgeon: Newt Minion, MD;  Location: [facility];  Service: Orthopedics;  Laterality: Right;  Removal Deep Retained Hardware Right Distal Radius  . MULTIPLE TOOTH EXTRACTIONS     for dentures     A IV Location/Drains/Wounds Patient Lines/Drains/Airways Status   Active Line/Drains/Airways    Name:   Placement date:   Placement time:   Site:   Days:   Peripheral IV 12/29/18 Left Antecubital   12/29/18    0459    Antecubital   less than 1   Wound 02/11/12 Laceration Head Posterior 1/2 IN LACERATION TO POSTERIOR SCALP   02/11/12    1347    Head   2513          Intake/Output Last 24 hours  Intake/Output Summary (Last 24 hours) at 12/29/2018 1322 Last data filed at 12/29/2018 1041 Gross per 24 hour  Intake 2000 ml  Output -  Net 2000 ml    Labs/Imaging Results for orders placed or performed during the hospital encounter of 12/29/18 (from the past 48 hour(s))  CBC with Differential/Platelet     Status: None   Collection Time: 12/29/18  4:56 AM  Result Value Ref Range  WBC 5.6 4.0 - 10.5 K/uL   RBC 4.51 4.22 - 5.81 MIL/uL   Hemoglobin 14.5 13.0 - 17.0 g/dL   HCT 42.3 39.0 - 52.0 %   MCV 93.8 80.0 - 100.0 fL   MCH 32.2 26.0 - 34.0 pg   MCHC 34.3 30.0 - 36.0 g/dL   RDW 13.0 11.5 - 15.5 %   Platelets 154 150 - 400 K/uL   nRBC 0.0 0.0 - 0.2 %   Neutrophils Relative % 54 %   Neutro Abs 3.1 1.7 - 7.7 K/uL   Lymphocytes Relative 30 %   Lymphs Abs 1.7 0.7 - 4.0 K/uL   Monocytes Relative 12 %   Monocytes Absolute 0.6 0.1 - 1.0 K/uL   Eosinophils Relative 3 %   Eosinophils Absolute 0.2 0.0 - 0.5 K/uL   Basophils Relative 1 %   Basophils Absolute 0.0 0.0 - 0.1 K/uL   Immature Granulocytes 0 %   Abs Immature Granulocytes 0.02 0.00 - 0.07 K/uL    Comment: Performed at [facility], [place] [address], [place], [place] [address]  Comprehensive metabolic panel     Status: None   Collection Time: 12/29/18  4:56 AM  Result Value Ref Range   Sodium 137 135 - 145 mmol/L   Potassium 3.8 3.5 - 5.1 mmol/L   Chloride 101 98 - 111 mmol/L   CO2 26 22 - 32 mmol/L   Glucose, Bld 93 70 - 99 mg/dL   BUN 9 6 - 20 mg/dL   Creatinine, Ser 1.16 0.61 - 1.24 mg/dL   Calcium 9.1 8.9 - 10.3 mg/dL   Total Protein 7.6 6.5 - 8.1 g/dL   Albumin 4.2 3.5 - 5.0 g/dL   AST 25 15 - 41 U/L   ALT 17 0 - 44 U/L   Alkaline Phosphatase 94 38 - 126 U/L   Total Bilirubin 1.0 0.3 - 1.2 mg/dL   GFR calc non Af Amer >60 >60 mL/min   GFR calc Af Amer >60 >60 mL/min   Anion gap 10 5 - 15    Comment: Performed at [facility], [place] [address], [place], [place] [address]  Lipase, blood     Status: Abnormal   Collection Time: 12/29/18  4:56 AM  Result Value Ref Range   Lipase 56 (H) 11 - 51 U/L    Comment: Performed at [facility], [place] [address], [place], [place] [address]  Lactic acid, plasma     Status: None   Collection Time: 12/29/18  4:56 AM  Result Value Ref Range   Lactic Acid, Venous 0.8 0.5 - 1.9 mmol/L    Comment: Performed at [facility], [place] [address], [place], [place] [address]  Ethanol     Status: None   Collection Time: 12/29/18  4:56 AM  Result Value Ref Range   Alcohol, Ethyl (B) <10 <10 mg/dL    Comment: (NOTE) Lowest detectable limit for serum alcohol is 10 mg/dL. For medical purposes only. Performed at [facility], [place] [address], [place], [place] [address]   SARS Coronavirus 2 (CEPHEID - Performed in [place] hospital lab), Hosp Order     Status: None   Collection Time: 12/29/18  7:20 AM   Specimen: Nasopharyngeal Swab  Result Value Ref Range   SARS Coronavirus 2 NEGATIVE NEGATIVE    Comment: (NOTE) If result is NEGATIVE SARS-CoV-2 target nucleic acids are NOT  DETECTED. The SARS-CoV-2 RNA is generally detectable in  upper and lower  respiratory specimens during the acute phase of infection. The lowest  concentration of SARS-CoV-2 viral copies this assay can detect is 250  copies / mL. A negative result does not preclude SARS-CoV-2 infection  and should not be used as the sole basis for treatment or other  patient management decisions.  A negative result may occur with  improper specimen collection / handling, submission of specimen other  than nasopharyngeal swab, presence of viral mutation(s) within the  areas targeted by this assay, and inadequate number of viral copies  (<250 copies / mL). A negative result must be combined with clinical  observations, patient history, and epidemiological information. If result is POSITIVE SARS-CoV-2 target nucleic acids are DETECTED. The SARS-CoV-2 RNA is generally detectable in upper and lower  respiratory specimens dur ing the acute phase of infection.  Positive  results are indicative of active infection with SARS-CoV-2.  Clinical  correlation with patient history and other diagnostic information is  necessary to determine patient infection status.  Positive results do  not rule out bacterial infection or co-infection with other viruses. If result is PRESUMPTIVE POSTIVE SARS-CoV-2 nucleic acids MAY BE PRESENT.   A presumptive positive result was obtained on the submitted specimen  and confirmed on repeat testing.  While 2019 novel coronavirus  (SARS-CoV-2) nucleic acids may be present in the submitted sample  additional confirmatory testing may be necessary for epidemiological  and / or clinical management purposes  to differentiate between  SARS-CoV-2 and other Sarbecovirus currently known to infect humans.  If clinically indicated additional testing with an alternate test  methodology [phone]) is advised. The SARS-CoV-2 RNA is generally  detectable in upper and lower respiratory sp ecimens during  the acute  phase of infection. The expected result is Negative. Fact Sheet for Patients:  StrictlyIdeas.no Fact Sheet for Healthcare Providers: BankingDealers.co.za This test is not yet approved or cleared by the [place] FDA and has been authorized for detection and/or diagnosis of SARS-CoV-2 by FDA under an Emergency Use Authorization (EUA).  This EUA will remain in effect (meaning this test can be used) for the duration of the COVID-19 declaration under Section 564(b)(1) of the Act, 21 U.S.C. section 360bbb-3(b)(1), unless the authorization is terminated or revoked sooner. Performed at [facility], [place] [address] [place], [place] [address]    Dg Chest Port 1 View  Result Date: 12/27/2018 CLINICAL DATA:  Weakness and recent COVID-19 exposure EXAM: PORTABLE CHEST 1 VIEW COMPARISON:  04/14/2018 FINDINGS: The heart size and mediastinal contours are within normal limits. Both lungs are clear. The visualized skeletal structures are unremarkable. IMPRESSION: No active disease. Electronically Signed   By: Inez Catalina M.D.   On: 12/27/2018 18:08   US Abdomen Limited Ruq  Result Date: 12/29/2018 CLINICAL DATA:  53 year old male with abdominal pain and pancreatitis. EXAM: ULTRASOUND ABDOMEN LIMITED RIGHT UPPER QUADRANT COMPARISON:  04/14/2018 CT and 04/25/2017 ultrasound FINDINGS: Gallbladder: The gallbladder is unremarkable. There is no evidence of cholelithiasis or acute cholecystitis. Common bile duct: Diameter: 2 mm.  No intrahepatic or extrahepatic biliary dilatation. Liver: No focal lesion identified. Within normal limits in parenchymal echogenicity. Portal vein is patent on color Doppler imaging with normal direction of blood flow towards the liver. IMPRESSION: Unremarkable RIGHT UPPER quadrant abdominal ultrasound. Normal gallbladder-no evidence of cholelithiasis. Electronically Signed   By: Margarette Canada M.D.   On:  12/29/2018 09:11    Pending Labs [employer] (From admission, onward)    Start  Ordered   Signed and Held  Comprehensive metabolic panel  Tomorrow morning,   R     Signed and Held   Signed and Held  CBC  Tomorrow morning,   R     Signed and Held   Signed and Held  Lipase, blood  Tomorrow morning,   R     Signed and Held          Vitals/Pain Today's Vitals   12/29/18 1130 12/29/18 1200 12/29/18 1230 12/29/18 1300  BP: 137/85 138/87 133/88 136/86  Pulse: (!) 57 (!) 56 74 (!) 53  Resp: 15 12 18 12   Temp:      TempSrc:      SpO2: 97% 98% 100% 99%  Weight:      Height:      PainSc:        Isolation Precautions No active isolations  Medications Medications  famotidine (PEPCID) IVPB 20 mg premix (0 mg Intravenous Stopped 12/29/18 1318)  morphine 2 MG/ML injection 2 mg (2 mg Intravenous Given 12/29/18 0857)  0.9 %  sodium chloride infusion ( Intravenous New Bag/Given 12/29/18 0859)  HYDROmorphone (DILAUDID) injection 1 mg (1 mg Intravenous Given 12/29/18 0456)  ondansetron (ZOFRAN) injection 4 mg (4 mg Intravenous Given 12/29/18 0455)  sodium chloride 0.9 % bolus 1,000 mL (0 mLs Intravenous Stopped 12/29/18 0643)  fentaNYL (SUBLIMAZE) injection 100 mcg (100 mcg Intravenous Given 12/29/18 0553)  HYDROmorphone (DILAUDID) injection 1 mg (1 mg Intravenous Given 12/29/18 0638)  ondansetron (ZOFRAN) injection 4 mg (4 mg Intravenous Given 12/29/18 1751)  sodium chloride 0.9 % bolus 1,000 mL (0 mLs Intravenous Stopped 12/29/18 1041)    Mobility walks with device Low fall risk   Focused Assessments GI    R Recommendations: See Admitting Provider Note  Report given to:   Additional Notes:

## 2018-12-29 NOTE — ED Provider Notes (Signed)
[facility] DEPT Provider Note   CSN: [id number] Arrival date & time: 12/29/18  0400     History   Chief Complaint Chief Complaint  Patient presents with  . Abdominal Pain  . Diarrhea    HPI Gerrick R Bodkin is a 53 y.o. male.     The history is provided by the patient.  Abdominal Pain Pain location:  Epigastric Pain quality: sharp   Pain radiates to:  Back Pain severity:  Severe Onset quality:  Gradual Duration:  2 days Timing:  Intermittent Progression:  Worsening Chronicity:  Recurrent Context: alcohol use   Relieved by:  Nothing Worsened by:  Movement and palpation Associated symptoms: chest pain, diarrhea and nausea   Associated symptoms: no constipation, no fever and no vomiting   Diarrhea Associated symptoms: abdominal pain   Associated symptoms: no fever and no vomiting   Patient presents with abdominal pain and diarrhea.  He reports this episode began about 2 days ago.  He reports he has been under a lot of stress, and when he is stressed out he drinks alcohol.  Alcohol then triggers abdominal pain as well as nausea and diarrhea.  He is unsure if he has bloody stool because he is blind No fevers. He reports tonight his pain became severe in his upper abdomen and went to his chest and back.  This is similar to prior episodes of pancreatitis.  Past Medical History:  Diagnosis Date  . Alcohol abuse   . Anxiety   . Arthritis   . 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 Elevated LFTs   . Foot and toe(s), blister    bilat feet, blistering between toes  . GERD (gastroesophageal reflux disease)   . Glaucoma   . Headache(784.0)   . Hypertension    hx of  . Insomnia   . Pancreatitis   . UTI (lower urinary tract infection) 08/04/11   finishing abx tomorrow  . Weight loss    25 lb wt loss since 08/2010  . Wrist fracture, right     Patient Active Problem List   Diagnosis Date Noted  . Alcoholic pancreatitis 37/62/8315   . Essential hypertension 04/14/2018  . Blindness of both eyes 04/14/2018  . Alcohol use 04/14/2018  . Hyponatremia 04/14/2018  . RUQ pain   . Abnormal CT of the abdomen   . 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 ESOPHAGOGASTRODUODENOSCOPY    . ESOPHAGOGASTRODUODENOSCOPY (EGD) WITH PROPOFOL N/A 05/09/2017   Procedure: ESOPHAGOGASTRODUODENOSCOPY (EGD) WITH PROPOFOL;  Surgeon: Doran Stabler, MD;  Location: [facility];  Service: Gastroenterology;  Laterality: N/A;  . EYE SURGERY Left   . EYE SURGERY Right    prostetic eye  . foot srugery     removal of bone near small toe  . FRACTURE SURGERY Right    wrist  . HARDWARE REMOVAL  08/06/2011   Procedure: HARDWARE REMOVAL;  Surgeon: Newt Minion, MD;  Location: [facility];  Service: Orthopedics;  Laterality: Right;  Removal Deep Retained Hardware Right Distal Radius  . MULTIPLE TOOTH EXTRACTIONS     for dentures        Home Medications    Prior to Admission medications   Medication Sig Start Date End Date Taking? Authorizing Provider  amLODipine (NORVASC) 5 MG tablet Take 5 mg by mouth daily.  [provider]  ciprofloxacin (CILOXAN) 0.3 % ophthalmic solution Place 1 drop into both eyes every 4 (four) hours while awake. Administer 1 drop, every 2 hours, while awake, for 2 days. Then 1 drop, every 4 hours, while awake, for the next 5 days. 04/15/18   Regalado, Belkys A, MD  folic acid (FOLVITE) 1 MG tablet Take 1 tablet (1 mg total) by mouth daily. 04/16/18   Regalado, Belkys A, MD  mirtazapine (REMERON) 15 MG tablet Take 15 mg by mouth at bedtime.    [provider]  Multiple Vitamin (MULTIVITAMIN WITH MINERALS) TABS tablet Take 1 tablet by mouth daily. 04/16/18   Regalado, Belkys A, MD  omeprazole (PRILOSEC) 40 MG capsule Take 40 mg by mouth daily.    [provider]  terbinafine  (LAMISIL) 250 MG tablet Take 1 tablet (250 mg total) by mouth daily. 05/25/18   Hyatt, Max T, DPM    Family History Family History  Problem Relation Age of Onset  . Diabetes Sister   . Diabetes Maternal Aunt        aunt and uncles  . Hypertension Sister        aunt and uncles maternal side  . Colon cancer Neg Hx     Social History Social History   Tobacco Use  . Smoking status: Current Every Day Smoker    Packs/day: 1.00    Types: Cigarettes, Cigars  . Smokeless tobacco: Never Used  . Tobacco comment: 1/2 - 1 cigar/day  Substance Use Topics  . Alcohol use: Yes    Alcohol/week: 7.0 standard drinks    Types: 7 Standard drinks or equivalent per week  . Drug use: Yes    Types: Marijuana    Comment: smokes marijuana 1 to 2 times daily     Allergies   Patient has no known allergies.   Review of Systems Review of Systems  Constitutional: Negative for fever.  Cardiovascular: Positive for chest pain.  Gastrointestinal: Positive for abdominal pain, diarrhea and nausea. Negative for constipation and vomiting.  All other systems reviewed and are negative.    Physical Exam Updated Vital Signs BP (!) 153/88 (BP Location: Left Arm)   Pulse 66   Temp 98 F (36.7 C) (Oral)   Resp 14   Ht 1.702 m (5\' 7" )   Wt 68 kg   SpO2 100%   BMI 23.49 kg/m   Physical Exam CONSTITUTIONAL: Well developed/well nourished HEAD: Normocephalic/atraumatic EYES: Blind ENMT: Mucous membranes moist NECK: supple no meningeal signs SPINE/BACK:entire spine nontender CV: S1/S2 noted, no murmurs/rubs/gallops noted LUNGS: Lungs are clear to auscultation bilaterally, no apparent distress ABDOMEN: soft, moderate tenderness in epigastric region, no rebound or guarding, bowel sounds noted throughout abdomen, chronic appearing umbilical hernia without tenderness or erythema GU:no cva tenderness NEURO: Pt is awake/alert/appropriate, moves all extremitiesx4.  No facial droop.   EXTREMITIES: pulses  normal/equal, full ROM SKIN: warm, color normal PSYCH: no abnormalities of mood noted, alert and oriented to situation   ED Treatments / Results  Labs (all labs ordered are listed, but only abnormal results are displayed) Labs Reviewed  LIPASE, BLOOD - Abnormal; Notable for the following components:      Result Value   Lipase 56 (*)    All other components within normal limits  SARS CORONAVIRUS 2 (HOSPITAL ORDER, [facility])  CBC WITH DIFFERENTIAL/PLATELET  COMPREHENSIVE METABOLIC PANEL  LACTIC ACID, PLASMA  ETHANOL    EKG EKG Interpretation  Date/Time:  Friday December 29 2018 04:26:18 EDT Ventricular Rate:  59 PR Interval:    QRS Duration: 81 QT Interval:  395 QTC Calculation: 392 R Axis:   33 Text Interpretation:  Sinus rhythm Probable anteroseptal infarct, old No significant change since last tracing Confirmed by Ripley Fraise [phone]) on 12/29/2018 4:29:49 AM   Radiology   Procedures Procedures   Medications Ordered in ED Medications  sodium chloride 0.9 % bolus 1,000 mL (1,000 mLs Intravenous New Bag/Given 12/29/18 0716)  HYDROmorphone (DILAUDID) injection 1 mg (1 mg Intravenous Given 12/29/18 0456)  ondansetron (ZOFRAN) injection 4 mg (4 mg Intravenous Given 12/29/18 0455)  sodium chloride 0.9 % bolus 1,000 mL (0 mLs Intravenous Stopped 12/29/18 0643)  fentaNYL (SUBLIMAZE) injection 100 mcg (100 mcg Intravenous Given 12/29/18 0553)  HYDROmorphone (DILAUDID) injection 1 mg (1 mg Intravenous Given 12/29/18 0638)  ondansetron (ZOFRAN) injection 4 mg (4 mg Intravenous Given 12/29/18 5573)     Initial Impression / Assessment and Plan / ED Course  I have reviewed the triage vital signs and the nursing notes.  Pertinent labs & imaging results that were available during my care of the patient were reviewed by me and considered in my medical decision making (see chart for details).        4:34 AM Patient presents for abdominal pain and  diarrhea.  Reports this is similar to prior episodes of pancreatitis.  He admits to recent alcohol use which triggered this type of pain.  Labs are pending at this time.  Will treat pain and reassess 7:37 AM Patient received multiple doses of nausea medicine and pain medicine.  He is still having pain.  Suspect pancreatitis.  Overall labs otherwise reassuring.  Will defer imaging he has had previous CT scans that revealed pancreatitis.  He will be admitted.  Discussed with Dr. Sloan Leiter for admission Final Clinical Impressions(s) / ED Diagnoses   Final diagnoses:  Alcohol-induced acute pancreatitis without infection or necrosis    ED Discharge Orders    None       Ripley Fraise, MD 12/29/18 [phone]

## 2018-12-30 ENCOUNTER — Observation Stay (HOSPITAL_COMMUNITY): Payer: Medicare Other

## 2018-12-30 DIAGNOSIS — Z20828 Contact with and (suspected) exposure to other viral communicable diseases: Secondary | ICD-10-CM | POA: Diagnosis present

## 2018-12-30 DIAGNOSIS — R112 Nausea with vomiting, unspecified: Secondary | ICD-10-CM | POA: Diagnosis not present

## 2018-12-30 DIAGNOSIS — H548 Legal blindness, as defined in USA: Secondary | ICD-10-CM | POA: Diagnosis present

## 2018-12-30 DIAGNOSIS — K219 Gastro-esophageal reflux disease without esophagitis: Secondary | ICD-10-CM | POA: Diagnosis present

## 2018-12-30 DIAGNOSIS — R1013 Epigastric pain: Secondary | ICD-10-CM | POA: Diagnosis not present

## 2018-12-30 DIAGNOSIS — I10 Essential (primary) hypertension: Secondary | ICD-10-CM | POA: Diagnosis present

## 2018-12-30 DIAGNOSIS — F17210 Nicotine dependence, cigarettes, uncomplicated: Secondary | ICD-10-CM | POA: Diagnosis present

## 2018-12-30 DIAGNOSIS — K860 Alcohol-induced chronic pancreatitis: Secondary | ICD-10-CM | POA: Diagnosis present

## 2018-12-30 DIAGNOSIS — K852 Alcohol induced acute pancreatitis without necrosis or infection: Secondary | ICD-10-CM | POA: Diagnosis not present

## 2018-12-30 DIAGNOSIS — F1020 Alcohol dependence, uncomplicated: Secondary | ICD-10-CM | POA: Diagnosis present

## 2018-12-30 DIAGNOSIS — K2920 Alcoholic gastritis without bleeding: Secondary | ICD-10-CM | POA: Diagnosis present

## 2018-12-30 LAB — CBC
HCT: 40.9 % (ref 39.0–52.0)
Hemoglobin: 13.4 g/dL (ref 13.0–17.0)
MCH: 30.9 pg (ref 26.0–34.0)
MCHC: 32.8 g/dL (ref 30.0–36.0)
MCV: 94.5 fL (ref 80.0–100.0)
Platelets: 153 10*3/uL (ref 150–400)
RBC: 4.33 MIL/uL (ref 4.22–5.81)
RDW: 12.7 % (ref 11.5–15.5)
WBC: 4.8 10*3/uL (ref 4.0–10.5)
nRBC: 0 % (ref 0.0–0.2)

## 2018-12-30 LAB — COMPREHENSIVE METABOLIC PANEL
ALT: 15 U/L (ref 0–44)
AST: 24 U/L (ref 15–41)
Albumin: 3.6 g/dL (ref 3.5–5.0)
Alkaline Phosphatase: 93 U/L (ref 38–126)
Anion gap: 8 (ref 5–15)
BUN: 5 mg/dL — ABNORMAL LOW (ref 6–20)
CO2: 26 mmol/L (ref 22–32)
Calcium: 8.5 mg/dL — ABNORMAL LOW (ref 8.9–10.3)
Chloride: 101 mmol/L (ref 98–111)
Creatinine, Ser: 0.91 mg/dL (ref 0.61–1.24)
GFR calc Af Amer: >60 mL/min (ref >60)
GFR calc non Af Amer: >60 mL/min (ref >60)
Glucose, Bld: 112 mg/dL — ABNORMAL HIGH (ref 70–99)
Potassium: 3.6 mmol/L (ref 3.5–5.1)
Sodium: 135 mmol/L (ref 135–145)
Total Bilirubin: 0.8 mg/dL (ref 0.3–1.2)
Total Protein: 7 g/dL (ref 6.5–8.1)

## 2018-12-30 LAB — LIPASE, BLOOD: Lipase: 37 U/L (ref 11–51)

## 2018-12-30 MED ORDER — ADULT MULTIVITAMIN W/MINERALS CH: 1.0000 | ORAL_TABLET | Freq: Every day | ORAL | Status: DC

## 2018-12-30 MED ORDER — VITAMIN B-1 100 MG PO TABS
100.0000 mg | ORAL_TABLET | Freq: Every day | ORAL | Status: DC
  Administered 2018-12-30 – 2018-12-31 (×2): 100 mg via ORAL
  Filled 2018-12-30 (×2): qty 1

## 2018-12-30 MED ORDER — FOLIC ACID 1 MG PO TABS: 1.0000 mg | ORAL_TABLET | Freq: Every day | ORAL | Status: DC

## 2018-12-30 MED ORDER — THIAMINE HCL 100 MG/ML IJ SOLN: 100.0000 mg | Freq: Every day | INTRAMUSCULAR | Status: DC

## 2018-12-30 MED ORDER — LORAZEPAM 2 MG/ML IJ SOLN: 1.0000 mg | Freq: Four times a day (QID) | INTRAMUSCULAR | Status: DC | PRN

## 2018-12-30 MED ORDER — LORAZEPAM 1 MG PO TABS
1.0000 mg | ORAL_TABLET | Freq: Four times a day (QID) | ORAL | Status: DC | PRN
  Administered 2018-12-30 (×2): 1 mg via ORAL
  Filled 2018-12-30 (×3): qty 1

## 2018-12-30 MED ORDER — PANTOPRAZOLE SODIUM 40 MG PO TBEC
40.0000 mg | DELAYED_RELEASE_TABLET | Freq: Every day | ORAL | Status: DC
  Administered 2018-12-30 – 2018-12-31 (×2): 40 mg via ORAL
  Filled 2018-12-30 (×2): qty 1

## 2018-12-30 NOTE — Progress Notes (Signed)
Austin [name]  PROGRESS NOTE    [place] Alkire [name]  CWC:[id number] DOB: [birth year] DOA: 12/29/2018 PCP: [facility]   Brief Narrative:   Austin [name] is a 53 y.o. male with medical history significant of chronic recurrent pancreatitis, hypertension, blindness due to glaucoma who presents to the emergency room with nausea, unable to eat any food, right upper quadrant, right flank and midepigastric pain for about 3 days.  According to the patient, this is insidious in onset, progressively worse, he is not able to eat anything since Monday, anything that he eats exacerbates it and he gets nauseated, he has normal bowel movement.  Nothing relieving his pain.  Now he is getting more pain along the retrosternal.  These episodes sound like his pancreatitis episodes.  He does drink 1 or 2 cans of beer a day and last drink was yesterday morning. Denies any fever chills.  Denies any chest pain or shortness of breath.  No cough cold or flulike symptoms.   Assessment & Plan:   Principal Problem:   Intractable nausea and vomiting Active Problems:   Alcoholic pancreatitis   Essential hypertension   Blindness of both eyes   Epigastric and right upper quadrant abdominal pain     - Suspect acute on chronic pancreatitis or alcohol induced gastritis.     - IV fluids, will challenge with clears liquid diet, adequate pain medications intravenous and oral opiates are needed, nausea medications.       - labs are ok      - On Pepcid IV BID     - continues to c/o ab pain; CT ab ordered     - [place] RUQ ab negative     - add protonix, advance diet as tolerated  Alcoholism     - Patient is stated 1 or 2 beer a day with no history of withdrawals.     - multivitamins.     - CIWA  Essential hypertension     - amlodipine.  Disability with both eye blindness     - Patient lives alone.  He needs to have good symptom control before he goes home independent.  Continue to c/o of abdominal pain. CT ab ordered.  Advance diet as tolerated.   DVT prophylaxis: lovenox Code Status: FULL   Disposition Plan: TBD   Subjective: "I don't know why it still hurts."  Objective: Vitals:   12/29/18 1300 12/29/18 1451 12/29/18 2126 12/30/18 0300  BP: 136/86 (!) 158/99 134/80 (!) 151/85  Pulse: (!) 53 (!) 59 63 (!) 56  Resp: 12 16 16 15   Temp:  98.3 F (36.8 C) 98 F (36.7 C) 98.2 F (36.8 C)  TempSrc:  Oral Oral Oral  SpO2: 99% 100% 100% 100%  Weight:      Height:        Intake/Output Summary (Last 24 hours) at 12/30/2018 1150 Last data filed at 12/30/2018 1022 Gross per 24 hour  Intake 2276.84 ml  Output 1850 ml  Net 426.84 ml   Filed Weights   12/29/18 0413  Weight: 68 kg    Examination:  General: 53 y.o. male resting in bed in NAD Cardiovascular: RRR, +S1, S2, no m/g/r, equal pulses throughout Respiratory: CTABL, no w/r/r, normal WOB GI: BS+, RUQ/LUQ ttp, no masses noted, no organomegaly noted MSK: No e/c/c Skin: No rashes, bruises, ulcerations noted Neuro: alert to name, follows commands   Data Reviewed: I have personally reviewed following labs and imaging studies.  CBC: Recent Labs  Lab 12/27/18 1716 12/29/18 0456 12/30/18 0603  WBC 6.5 5.6 4.8  NEUTROABS 2.9 3.1  --   HGB 13.2 14.5 13.4  HCT 37.9* 42.3 40.9  MCV 92.2 93.8 94.5  PLT 162 154 008   Basic Metabolic Panel: Recent Labs  Lab 12/27/18 1716 12/29/18 0456 12/30/18 0603  NA 136 137 135  K 3.8 3.8 3.6  CL 100 101 101  CO2 25 26 26   GLUCOSE 85 93 112*  BUN 12 9 5*  CREATININE 0.99 1.16 0.91  CALCIUM 8.5* 9.1 8.5*   GFR: Estimated Creatinine Clearance: 87.8 mL/min (by C-G formula based on SCr of 0.91 mg/dL). Liver Function Tests: Recent Labs  Lab 12/27/18 1716 12/29/18 0456 12/30/18 0603  AST 24 25 24   ALT 19 17 15   ALKPHOS 83 94 93  BILITOT 0.5 1.0 0.8  PROT 6.8 7.6 7.0  ALBUMIN 3.7 4.2 3.6   Recent Labs  Lab 12/27/18 1716 12/29/18 0456 12/30/18 0603  LIPASE 51 56* 37   No  results for input(s): AMMONIA in the last 168 hours. Coagulation Profile: No results for input(s): INR, PROTIME in the last 168 hours. Cardiac Enzymes: No results for input(s): CKTOTAL, CKMB, CKMBINDEX, TROPONINI in the last 168 hours. BNP (last 3 results) No results for input(s): PROBNP in the last 8760 hours. HbA1C: No results for input(s): HGBA1C in the last 72 hours. CBG: No results for input(s): GLUCAP in the last 168 hours. Lipid Profile: No results for input(s): CHOL, HDL, LDLCALC, TRIG, CHOLHDL, LDLDIRECT in the last 72 hours. Thyroid Function Tests: No results for input(s): TSH, T4TOTAL, FREET4, T3FREE, THYROIDAB in the last 72 hours. Anemia Panel: No results for input(s): VITAMINB12, FOLATE, FERRITIN, TIBC, IRON, RETICCTPCT in the last 72 hours. Sepsis Labs: Recent Labs  Lab 12/29/18 0456  LATICACIDVEN 0.8    Recent Results (from the past 240 hour(s))  Novel Coronavirus,NAA,(SEND-OUT TO REF LAB - TAT 24-48 hrs); Hosp Order     Status: None   Collection Time: 12/27/18  5:39 PM   Specimen: Nasopharyngeal Swab; Respiratory  Result Value Ref Range Status   SARS-CoV-2, NAA NOT DETECTED NOT DETECTED Final    Comment: (NOTE) This test was developed and its performance characteristics determined by [employer]. This test has not been FDA cleared or approved. This test has been authorized by FDA under an Emergency Use Authorization (EUA). This test is only authorized for the duration of time the declaration that circumstances exist justifying the authorization of the emergency use of in vitro diagnostic tests for detection of SARS-CoV-2 virus and/or diagnosis of COVID-19 infection under section 564(b)(1) of the Act, 21 U.S.C. 676PPJ-0(D)(3), unless the authorization is terminated or revoked sooner. When diagnostic testing is negative, the possibility of a false negative result should be considered in the context of a patient's recent exposures and the presence of  clinical signs and symptoms consistent with COVID-19. An individual without symptoms of COVID-19 and who is not shedding SARS-CoV-2 virus would expect to have a negative (not detected) result in this assay. Performed  At: [facility] [place], [place] [id number] Rush Farmer MD DX:[id number]    [place]  Final    Comment: Performed at [facility] [address] [place], [place] [address]  SARS Coronavirus 2 (CEPHEID - Performed in [facility] hospital lab), Hosp Order     Status: None   Collection Time: 12/29/18  7:20 AM   Specimen: Nasopharyngeal Swab  Result Value Ref Range Status  SARS Coronavirus 2 NEGATIVE NEGATIVE Final    Comment: (NOTE) If result is NEGATIVE SARS-CoV-2 target nucleic acids are NOT DETECTED. The SARS-CoV-2 RNA is generally detectable in upper and lower  respiratory specimens during the acute phase of infection. The lowest  concentration of SARS-CoV-2 viral copies this assay can detect is 250  copies / mL. A negative result does not preclude SARS-CoV-2 infection  and should not be used as the sole basis for treatment or other  patient management decisions.  A negative result may occur with  improper specimen collection / handling, submission of specimen other  than nasopharyngeal swab, presence of viral mutation(s) within the  areas targeted by this assay, and inadequate number of viral copies  (<250 copies / mL). A negative result must be combined with clinical  observations, patient history, and epidemiological information. If result is POSITIVE SARS-CoV-2 target nucleic acids are DETECTED. The SARS-CoV-2 RNA is generally detectable in upper and lower  respiratory specimens dur ing the acute phase of infection.  Positive  results are indicative of active infection with SARS-CoV-2.  Clinical  correlation with patient history and other diagnostic information is  necessary to determine  patient infection status.  Positive results do  not rule out bacterial infection or co-infection with other viruses. If result is PRESUMPTIVE POSTIVE SARS-CoV-2 nucleic acids MAY BE PRESENT.   A presumptive positive result was obtained on the submitted specimen  and confirmed on repeat testing.  While 2019 novel coronavirus  (SARS-CoV-2) nucleic acids may be present in the submitted sample  additional confirmatory testing may be necessary for epidemiological  and / or clinical management purposes  to differentiate between  SARS-CoV-2 and other Sarbecovirus currently known to infect humans.  If clinically indicated additional testing with an alternate test  methodology [phone]) is advised. The SARS-CoV-2 RNA is generally  detectable in upper and lower respiratory sp ecimens during the acute  phase of infection. The expected result is Negative. Fact Sheet for Patients:  StrictlyIdeas.no Fact Sheet for Healthcare Providers: BankingDealers.co.za This test is not yet approved or cleared by the [place] FDA and has been authorized for detection and/or diagnosis of SARS-CoV-2 by FDA under an Emergency Use Authorization (EUA).  This EUA will remain in effect (meaning this test can be used) for the duration of the COVID-19 declaration under Section 564(b)(1) of the Act, 21 U.S.C. section 360bbb-3(b)(1), unless the authorization is terminated or revoked sooner. Performed at [facility], [place] [address], [place], [place] [address]          Radiology Studies: US Abdomen Limited Ruq  Result Date: 12/29/2018 CLINICAL DATA:  53 year old male with abdominal pain and pancreatitis. EXAM: ULTRASOUND ABDOMEN LIMITED RIGHT UPPER QUADRANT COMPARISON:  04/14/2018 CT and 04/25/2017 ultrasound FINDINGS: Gallbladder: The gallbladder is unremarkable. There is no evidence of cholelithiasis or acute cholecystitis. Common bile duct:  Diameter: 2 mm.  No intrahepatic or extrahepatic biliary dilatation. Liver: No focal lesion identified. Within normal limits in parenchymal echogenicity. Portal vein is patent on color Doppler imaging with normal direction of blood flow towards the liver. IMPRESSION: Unremarkable RIGHT UPPER quadrant abdominal ultrasound. Normal gallbladder-no evidence of cholelithiasis. Electronically Signed   By: Margarette Canada M.D.   On: 12/29/2018 09:11     Scheduled Meds: . amLODipine  5 mg Oral Daily  . enoxaparin (LOVENOX) injection  40 mg Subcutaneous Q24H  . folic acid  1 mg Oral Daily  . mirtazapine  15 mg Oral QHS  . multivitamin with  minerals  1 tablet Oral Daily  . pantoprazole  40 mg Oral Daily   Continuous Infusions: . sodium chloride 125 mL/hr at 12/30/18 0600  . famotidine (PEPCID) IV 20 mg (12/30/18 0953)     LOS: 0 days    Time spent: 25 minutes spent in the coordination of care today.    Jonnie Finner, DO [facility] Pager [phone]  If 7PM-7AM, please contact night-coverage [email or web address] Password [facility] 12/30/2018, 11:50 AM

## 2018-12-31 MED ORDER — ONDANSETRON HCL 4 MG PO TABS: 4.0000 mg | ORAL_TABLET | Freq: Four times a day (QID) | ORAL | 0 refills | Status: DC | PRN

## 2018-12-31 MED ORDER — HYDROCODONE-ACETAMINOPHEN 5-325 MG PO TABS: 1.0000 | ORAL_TABLET | Freq: Four times a day (QID) | ORAL | 0 refills | Status: AC | PRN

## 2018-12-31 NOTE — Progress Notes (Signed)
Pt leaving this afternoon at 1300. Discharge instructions/prescriptions given/explained with pt verbalizing understanding. Pt aware of followup for Pancreatitis.

## 2018-12-31 NOTE — Discharge Summary (Signed)
. Physician Discharge Summary  Austin [name] ALP:[id number] DOB: 09-05-1965 DOA: 12/29/2018  PCP: [name]  Admit date: 12/29/2018 Discharge date: 12/31/2018  Admitted From: Home Disposition:  Discharged to home.   Recommendations for Outpatient Follow-up:  1. Follow up with PCP in  5 - 7 days. 2. Please obtain BMP/CBC in one week   Discharge Condition: Stable  CODE STATUS: FULL    Brief/Interim Summary: Austin [name] a 53 y.o.malewith medical history significant ofchronic recurrent pancreatitis, hypertension, blindness due to glaucoma who presents to the emergency room with nausea, unable to eat any food, right upper quadrant, right flank and midepigastric pain for about 3 days. According to the patient, this is insidious in onset, progressively worse, he is not able to eat anything since Monday, anything that he eats exacerbates it and he gets nauseated, he has normal bowel movement. Nothing relieving his pain. Now he is getting more pain along the retrosternal. These episodes sound like his pancreatitis episodes. He does drink 1 or 2 cans of beer a day and last drink was yesterday morning. Denies any fever chills. Denies any chest pain or shortness of breath. No cough cold or flulike symptoms.  Discharge Diagnoses:  Principal Problem:   Intractable nausea and vomiting Active Problems:   Alcoholic pancreatitis   Essential hypertension   Blindness of both eyes  Epigastric and right upper quadrant abdominal pain     - Suspect acute on chronic pancreatitis or alcohol induced gastritis.     - IV fluids, will challenge with clears liquid diet, adequate pain medications intravenous and oral opiates are needed, nausea medications.       - labs are ok      - On Pepcid IV BID     - continues to c/o ab pain; CT ab ordered, CT ab show recurrent acute pancreatitis     - [place] RUQ ab negative     - add protonix, advance diet as tolerated     - says he feels  better and wants to go home     - CLD/FLD for first day out, then slowly advance as tolerated     - Avoid EtOH use  Alcoholism     - Patient is stated 1 or 2 beer a day with no history of withdrawals.     - multivitamins.     - CIWA     - counseled against further EtOH use  Essential hypertension     - amlodipine.  Disability with both eye blindness     - Patient lives alone.  He needs to have good symptom control before he goes home independent.  Patient endorses feeling better and tolerating diet. He requests to go home. He's hemodynamically stable and labs are ok. Acute recurrent pancreatitis noted on CT ab. Clinical exam is better today. Will discharge to home. Should follow up with PCP in 5 - 7 days for hospital follow up. Recommend CLD/FLD for first day at least, then slowly advance as tolerated. He voiced understanding and agreement. Recommend against further EtOH use. He voices understanding and will consider abstaining for EtOH, but has not committed.   Discharge Instructions   Allergies as of 12/31/2018   No Known Allergies     Medication List    STOP taking these medications   ciprofloxacin 0.3 % ophthalmic solution Commonly known as: CILOXAN   terbinafine 250 MG tablet Commonly known as: LamISIL     TAKE these medications  amLODipine 5 MG tablet Commonly known as: NORVASC Take 5 mg by mouth daily.   folic acid 1 MG tablet Commonly known as: FOLVITE Take 1 tablet (1 mg total) by mouth daily.   GINSENG COMPLEX PO Take 1 capsule by mouth 2 (two) times a day.   HYDROcodone-acetaminophen 5-325 MG tablet Commonly known as: NORCO/VICODIN Take 1 tablet by mouth every 6 (six) hours as needed for up to 3 days for moderate pain.   mirtazapine 15 MG tablet Commonly known as: REMERON Take 15 mg by mouth at bedtime.   multivitamin with minerals Tabs tablet Take 1 tablet by mouth daily.   omeprazole 40 MG capsule Commonly known as: PRILOSEC Take 40 mg by  mouth 2 (two) times a day.   ondansetron 4 MG tablet Commonly known as: ZOFRAN Take 1 tablet (4 mg total) by mouth every 6 (six) hours as needed for nausea.       No Known Allergies   Procedures/Studies: Ct Abdomen Pelvis Wo Contrast  Result Date: 12/30/2018 CLINICAL DATA:  53 year old male with a history of upper abdominal pain and right flank pain for 2 days EXAM: CT ABDOMEN AND PELVIS WITHOUT CONTRAST TECHNIQUE: Multidetector CT imaging of the abdomen and pelvis was performed following the standard protocol without IV contrast. COMPARISON:  CT abdomen April 14, 2018 FINDINGS: Lower chest: Atelectasis/scarring at the lung bases. No acute finding. Hepatobiliary: Unremarkable liver.  Unremarkable gallbladder. Pancreas: Mild inflammatory changes/stranding at the head of the pancreas/uncinate process. Overall changes appear no worse than the comparison CT of April 14, 2018, however, current CT is performed without contrast. No radiopaque or calcified stones are identified. No focal fluid collection. Spleen: Unremarkable Adrenals/Urinary Tract: Unremarkable appearance of the adrenal glands. Right kidney without hydronephrosis or nephrolithiasis. Unremarkable course the right ureter. Left kidney without hydronephrosis. There is a nonobstructive tiny stone measuring 1 mm-2 mm in the left collecting system. No perinephric stranding. Unremarkable course of the left ureter. Unremarkable appearance of the urinary bladder. Stomach/Bowel: Unremarkable stomach. Small bowel unremarkable. No abnormal distension. Normal appendix. No colonic obstruction. No focal wall thickening or inflammation. Vascular/Lymphatic: Mild atherosclerosis of the abdominal aorta and the iliac arteries. Calcifications of the bilateral common femoral arteries. Reproductive: Transverse prostate diameter 43 mm. Other: None Musculoskeletal: No acute displaced fracture. Degenerative changes of the spine. IMPRESSION: Evidence of recurrent  acute pancreatitis. Correlation with lab values may be useful. Nonobstructive left-sided nephrolithiasis. Electronically Signed   By: Corrie Mckusick D.O.   On: 12/30/2018 11:51   Dg Chest Port 1 View  Result Date: 12/27/2018 CLINICAL DATA:  Weakness and recent COVID-19 exposure EXAM: PORTABLE CHEST 1 VIEW COMPARISON:  04/14/2018 FINDINGS: The heart size and mediastinal contours are within normal limits. Both lungs are clear. The visualized skeletal structures are unremarkable. IMPRESSION: No active disease. Electronically Signed   By: Inez Catalina M.D.   On: 12/27/2018 18:08   US Abdomen Limited Ruq  Result Date: 12/29/2018 CLINICAL DATA:  53 year old male with abdominal pain and pancreatitis. EXAM: ULTRASOUND ABDOMEN LIMITED RIGHT UPPER QUADRANT COMPARISON:  04/14/2018 CT and 04/25/2017 ultrasound FINDINGS: Gallbladder: The gallbladder is unremarkable. There is no evidence of cholelithiasis or acute cholecystitis. Common bile duct: Diameter: 2 mm.  No intrahepatic or extrahepatic biliary dilatation. Liver: No focal lesion identified. Within normal limits in parenchymal echogenicity. Portal vein is patent on color Doppler imaging with normal direction of blood flow towards the liver. IMPRESSION: Unremarkable RIGHT UPPER quadrant abdominal ultrasound. Normal gallbladder-no evidence of cholelithiasis. Electronically Signed  By: Margarette Canada M.D.   On: 12/29/2018 09:11    Subjective: "I want to go home. I'm better."  Discharge Exam: Vitals:   12/31/18 0000 12/31/18 0600  BP: 108/75 117/84  Pulse: 65 68  Resp: 18 17  Temp: 98.3 F (36.8 C) 98.4 F (36.9 C)  SpO2: 100% 98%   Vitals:   12/30/18 0300 12/30/18 1254 12/31/18 0000 12/31/18 0600  BP: (!) 151/85 (!) 144/87 108/75 117/84  Pulse: (!) 56 (!) 58 65 68  Resp: 15 16 18 17   Temp: 98.2 F (36.8 C) 98.4 F (36.9 C) 98.3 F (36.8 C) 98.4 F (36.9 C)  TempSrc: Oral Oral Oral Oral  SpO2: 100% 97% 100% 98%  Weight:      Height:         General: 53 y.o. male resting in bed in NAD Cardiovascular: RRR, +S1, S2, no m/g/r, equal pulses throughout Respiratory: CTABL, no w/r/r, normal WOB GI: BS+, NDNT, no masses noted, no organomegaly noted MSK: No e/c/c Skin: No rashes, bruises, ulcerations noted Neuro: alert to name, follows commands    The results of significant diagnostics from this hospitalization (including imaging, microbiology, ancillary and laboratory) are listed below for reference.     Microbiology: Recent Results (from the past 240 hour(s))  Novel Coronavirus,NAA,(SEND-OUT TO REF LAB - TAT 24-48 hrs); Hosp Order     Status: None   Collection Time: 12/27/18  5:39 PM   Specimen: Nasopharyngeal Swab; Respiratory  Result Value Ref Range Status   SARS-CoV-2, NAA NOT DETECTED NOT DETECTED Final    Comment: (NOTE) This test was developed and its performance characteristics determined by [employer]. This test has not been FDA cleared or approved. This test has been authorized by FDA under an Emergency Use Authorization (EUA). This test is only authorized for the duration of time the declaration that circumstances exist justifying the authorization of the emergency use of in vitro diagnostic tests for detection of SARS-CoV-2 virus and/or diagnosis of COVID-19 infection under section 564(b)(1) of the Act, 21 U.S.C. 127NTZ-0(Y)(1), unless the authorization is terminated or revoked sooner. When diagnostic testing is negative, the possibility of a false negative result should be considered in the context of a patient's recent exposures and the presence of clinical signs and symptoms consistent with COVID-19. An individual without symptoms of COVID-19 and who is not shedding SARS-CoV-2 virus would expect to have a negative (not detected) result in this assay. Performed  At: [facility] [place], [place] [id number] Rush Farmer MD FF:[id number]    [place]  Final    Comment: Performed at [facility] [address], [place], [place] [address]  SARS Coronavirus 2 (CEPHEID - Performed in [place] hospital lab), Hosp Order     Status: None   Collection Time: 12/29/18  7:20 AM   Specimen: Nasopharyngeal Swab  Result Value Ref Range Status   SARS Coronavirus 2 NEGATIVE NEGATIVE Final    Comment: (NOTE) If result is NEGATIVE SARS-CoV-2 target nucleic acids are NOT DETECTED. The SARS-CoV-2 RNA is generally detectable in upper and lower  respiratory specimens during the acute phase of infection. The lowest  concentration of SARS-CoV-2 viral copies this assay can detect is 250  copies / mL. A negative result does not preclude SARS-CoV-2 infection  and should not be used as the sole basis for treatment or other  patient management decisions.  A negative result may occur with  improper specimen collection / handling,  submission of specimen other  than nasopharyngeal swab, presence of viral mutation(s) within the  areas targeted by this assay, and inadequate number of viral copies  (<250 copies / mL). A negative result must be combined with clinical  observations, patient history, and epidemiological information. If result is POSITIVE SARS-CoV-2 target nucleic acids are DETECTED. The SARS-CoV-2 RNA is generally detectable in upper and lower  respiratory specimens dur ing the acute phase of infection.  Positive  results are indicative of active infection with SARS-CoV-2.  Clinical  correlation with patient history and other diagnostic information is  necessary to determine patient infection status.  Positive results do  not rule out bacterial infection or co-infection with other viruses. If result is PRESUMPTIVE POSTIVE SARS-CoV-2 nucleic acids MAY BE PRESENT.   A presumptive positive result was obtained on the submitted specimen  and confirmed on repeat testing.  While 2019 novel coronavirus  (SARS-CoV-2)  nucleic acids may be present in the submitted sample  additional confirmatory testing may be necessary for epidemiological  and / or clinical management purposes  to differentiate between  SARS-CoV-2 and other Sarbecovirus currently known to infect humans.  If clinically indicated additional testing with an alternate test  methodology [phone]) is advised. The SARS-CoV-2 RNA is generally  detectable in upper and lower respiratory sp ecimens during the acute  phase of infection. The expected result is Negative. Fact Sheet for Patients:  StrictlyIdeas.no Fact Sheet for Healthcare Providers: BankingDealers.co.za This test is not yet approved or cleared by the [place] FDA and has been authorized for detection and/or diagnosis of SARS-CoV-2 by FDA under an Emergency Use Authorization (EUA).  This EUA will remain in effect (meaning this test can be used) for the duration of the COVID-19 declaration under Section 564(b)(1) of the Act, 21 U.S.C. section 360bbb-3(b)(1), unless the authorization is terminated or revoked sooner. Performed at [facility], [place] [address], [place], [place] [address]      Labs: BNP (last 3 results) No results for input(s): BNP in the last 8760 hours. Basic Metabolic Panel: Recent Labs  Lab 12/27/18 1716 12/29/18 0456 12/30/18 0603  NA 136 137 135  K 3.8 3.8 3.6  CL 100 101 101  CO2 25 26 26   GLUCOSE 85 93 112*  BUN 12 9 5*  CREATININE 0.99 1.16 0.91  CALCIUM 8.5* 9.1 8.5*   Liver Function Tests: Recent Labs  Lab 12/27/18 1716 12/29/18 0456 12/30/18 0603  AST 24 25 24   ALT 19 17 15   ALKPHOS 83 94 93  BILITOT 0.5 1.0 0.8  PROT 6.8 7.6 7.0  ALBUMIN 3.7 4.2 3.6   Recent Labs  Lab 12/27/18 1716 12/29/18 0456 12/30/18 0603  LIPASE 51 56* 37   No results for input(s): AMMONIA in the last 168 hours. CBC: Recent Labs  Lab 12/27/18 1716 12/29/18 0456 12/30/18 0603   WBC 6.5 5.6 4.8  NEUTROABS 2.9 3.1  --   HGB 13.2 14.5 13.4  HCT 37.9* 42.3 40.9  MCV 92.2 93.8 94.5  PLT 162 154 153   Cardiac Enzymes: No results for input(s): CKTOTAL, CKMB, CKMBINDEX, TROPONINI in the last 168 hours. BNP: Invalid input(s): POCBNP CBG: No results for input(s): GLUCAP in the last 168 hours. D-Dimer No results for input(s): DDIMER in the last 72 hours. Hgb A1c No results for input(s): HGBA1C in the last 72 hours. Lipid Profile No results for input(s): CHOL, HDL, LDLCALC, TRIG, CHOLHDL, LDLDIRECT in the last 72 hours. Thyroid function studies No results  for input(s): TSH, T4TOTAL, T3FREE, THYROIDAB in the last 72 hours.  Invalid input(s): FREET3 Anemia work up No results for input(s): VITAMINB12, FOLATE, FERRITIN, TIBC, IRON, RETICCTPCT in the last 72 hours. Urinalysis    Component Value Date/Time   COLORURINE AMBER (A) 04/14/2018 1503   APPEARANCEUR CLEAR 04/14/2018 1503   LABSPEC 1.032 (H) 04/14/2018 1503   PHURINE 6.0 04/14/2018 1503   GLUCOSEU NEGATIVE 04/14/2018 1503   HGBUR MODERATE (A) 04/14/2018 1503   BILIRUBINUR NEGATIVE 04/14/2018 1503   KETONESUR 20 (A) 04/14/2018 1503   PROTEINUR NEGATIVE 04/14/2018 1503   UROBILINOGEN 2.0 (H) 09/13/2016 1039   NITRITE NEGATIVE 04/14/2018 1503   LEUKOCYTESUR NEGATIVE 04/14/2018 1503   Sepsis Labs Invalid input(s): PROCALCITONIN,  WBC,  LACTICIDVEN Microbiology Recent Results (from the past 240 hour(s))  Novel Coronavirus,NAA,(SEND-OUT TO REF LAB - TAT 24-48 hrs); Hosp Order     Status: None   Collection Time: 12/27/18  5:39 PM   Specimen: Nasopharyngeal Swab; Respiratory  Result Value Ref Range Status   SARS-CoV-2, NAA NOT DETECTED NOT DETECTED Final    Comment: (NOTE) This test was developed and its performance characteristics determined by [employer]. This test has not been FDA cleared or approved. This test has been authorized by FDA under an Emergency Use Authorization (EUA). This  test is only authorized for the duration of time the declaration that circumstances exist justifying the authorization of the emergency use of in vitro diagnostic tests for detection of SARS-CoV-2 virus and/or diagnosis of COVID-19 infection under section 564(b)(1) of the Act, 21 U.S.C. 803OZY-2(Q)(8), unless the authorization is terminated or revoked sooner. When diagnostic testing is negative, the possibility of a false negative result should be considered in the context of a patient's recent exposures and the presence of clinical signs and symptoms consistent with COVID-19. An individual without symptoms of COVID-19 and who is not shedding SARS-CoV-2 virus would expect to have a negative (not detected) result in this assay. Performed  At: [facility] [place], [place] [id number] Rush Farmer MD GQ:[id number]    [place]  Final    Comment: Performed at [facility] [address] [place], [place] [address]  SARS Coronavirus 2 (CEPHEID - Performed in [place] hospital lab), Hosp Order     Status: None   Collection Time: 12/29/18  7:20 AM   Specimen: Nasopharyngeal Swab  Result Value Ref Range Status   SARS Coronavirus 2 NEGATIVE NEGATIVE Final    Comment: (NOTE) If result is NEGATIVE SARS-CoV-2 target nucleic acids are NOT DETECTED. The SARS-CoV-2 RNA is generally detectable in upper and lower  respiratory specimens during the acute phase of infection. The lowest  concentration of SARS-CoV-2 viral copies this assay can detect is 250  copies / mL. A negative result does not preclude SARS-CoV-2 infection  and should not be used as the sole basis for treatment or other  patient management decisions.  A negative result may occur with  improper specimen collection / handling, submission of specimen other  than nasopharyngeal swab, presence of viral mutation(s) within the  areas targeted by this assay, and  inadequate number of viral copies  (<250 copies / mL). A negative result must be combined with clinical  observations, patient history, and epidemiological information. If result is POSITIVE SARS-CoV-2 target nucleic acids are DETECTED. The SARS-CoV-2 RNA is generally detectable in upper and lower  respiratory specimens dur ing the acute phase of infection.  Positive  results are indicative of  active infection with SARS-CoV-2.  Clinical  correlation with patient history and other diagnostic information is  necessary to determine patient infection status.  Positive results do  not rule out bacterial infection or co-infection with other viruses. If result is PRESUMPTIVE POSTIVE SARS-CoV-2 nucleic acids MAY BE PRESENT.   A presumptive positive result was obtained on the submitted specimen  and confirmed on repeat testing.  While 2019 novel coronavirus  (SARS-CoV-2) nucleic acids may be present in the submitted sample  additional confirmatory testing may be necessary for epidemiological  and / or clinical management purposes  to differentiate between  SARS-CoV-2 and other Sarbecovirus currently known to infect humans.  If clinically indicated additional testing with an alternate test  methodology [phone]) is advised. The SARS-CoV-2 RNA is generally  detectable in upper and lower respiratory sp ecimens during the acute  phase of infection. The expected result is Negative. Fact Sheet for Patients:  StrictlyIdeas.no Fact Sheet for Healthcare Providers: BankingDealers.co.za This test is not yet approved or cleared by the [place] FDA and has been authorized for detection and/or diagnosis of SARS-CoV-2 by FDA under an Emergency Use Authorization (EUA).  This EUA will remain in effect (meaning this test can be used) for the duration of the COVID-19 declaration under Section 564(b)(1) of the Act, 21 U.S.C. section 360bbb-3(b)(1), unless the  authorization is terminated or revoked sooner. Performed at [facility], [place] [address] [place], [place] [address]      Time coordinating discharge: 35 minutes  SIGNED:   Jonnie Finner, DO  [facility] 12/31/2018, 10:13 AM Pager   If 7PM-7AM, please contact night-coverage [email or web address] Password [email or web address]

## 2019-01-22 ENCOUNTER — Other Ambulatory Visit: Payer: Self-pay

## 2019-01-22 ENCOUNTER — Encounter (HOSPITAL_COMMUNITY): Payer: Self-pay | Admitting: *Deleted

## 2019-01-22 ENCOUNTER — Emergency Department (HOSPITAL_COMMUNITY): Payer: Medicare Other

## 2019-01-22 ENCOUNTER — Emergency Department (HOSPITAL_COMMUNITY)
Admission: EM | Admit: 2019-01-22 | Discharge: 2019-01-23 | Disposition: A | Discharge status: Home / Self Care | Payer: Medicare Other | Attending: Emergency Medicine | Admitting: Emergency Medicine

## 2019-01-22 DIAGNOSIS — H548 Legal blindness, as defined in USA: Secondary | ICD-10-CM | POA: Insufficient documentation

## 2019-01-22 DIAGNOSIS — K2900 Acute gastritis without bleeding: Secondary | ICD-10-CM | POA: Insufficient documentation

## 2019-01-22 DIAGNOSIS — Z79899 Other long term (current) drug therapy: Secondary | ICD-10-CM | POA: Insufficient documentation

## 2019-01-22 DIAGNOSIS — R1013 Epigastric pain: Secondary | ICD-10-CM

## 2019-01-22 DIAGNOSIS — I10 Essential (primary) hypertension: Secondary | ICD-10-CM | POA: Diagnosis not present

## 2019-01-22 DIAGNOSIS — F17210 Nicotine dependence, cigarettes, uncomplicated: Secondary | ICD-10-CM | POA: Insufficient documentation

## 2019-01-22 LAB — CBC
HCT: 43.7 % (ref 39.0–52.0)
Hemoglobin: 15.1 g/dL (ref 13.0–17.0)
MCH: 31.7 pg (ref 26.0–34.0)
MCHC: 34.6 g/dL (ref 30.0–36.0)
MCV: 91.6 fL (ref 80.0–100.0)
Platelets: 207 10*3/uL (ref 150–400)
RBC: 4.77 MIL/uL (ref 4.22–5.81)
RDW: 12.4 % (ref 11.5–15.5)
WBC: 5.8 10*3/uL (ref 4.0–10.5)
nRBC: 0 % (ref 0.0–0.2)

## 2019-01-22 LAB — COMPREHENSIVE METABOLIC PANEL
ALT: 14 U/L (ref 0–44)
AST: 21 U/L (ref 15–41)
Albumin: 4.2 g/dL (ref 3.5–5.0)
Alkaline Phosphatase: 92 U/L (ref 38–126)
Anion gap: 13 (ref 5–15)
BUN: 10 mg/dL (ref 6–20)
CO2: 23 mmol/L (ref 22–32)
Calcium: 9.2 mg/dL (ref 8.9–10.3)
Chloride: 97 mmol/L — ABNORMAL LOW (ref 98–111)
Creatinine, Ser: 1.59 mg/dL — ABNORMAL HIGH (ref 0.61–1.24)
GFR calc Af Amer: 57 mL/min — ABNORMAL LOW (ref >60)
GFR calc non Af Amer: 49 mL/min — ABNORMAL LOW (ref >60)
Glucose, Bld: 112 mg/dL — ABNORMAL HIGH (ref 70–99)
Potassium: 3.7 mmol/L (ref 3.5–5.1)
Sodium: 133 mmol/L — ABNORMAL LOW (ref 135–145)
Total Bilirubin: 0.9 mg/dL (ref 0.3–1.2)
Total Protein: 7.7 g/dL (ref 6.5–8.1)

## 2019-01-22 LAB — TROPONIN I (HIGH SENSITIVITY): Troponin I (High Sensitivity): 7 ng/L (ref <18)

## 2019-01-22 LAB — LIPASE, BLOOD: Lipase: 48 U/L (ref 11–51)

## 2019-01-22 MED ORDER — LIDOCAINE VISCOUS HCL 2 % MT SOLN
15.0000 mL | Freq: Once | OROMUCOSAL | Status: AC
  Administered 2019-01-22: 15 mL via ORAL
  Filled 2019-01-22: qty 15

## 2019-01-22 MED ORDER — ONDANSETRON HCL 4 MG/2ML IJ SOLN
4.0000 mg | Freq: Once | INTRAMUSCULAR | Status: AC
  Administered 2019-01-22: 4 mg via INTRAVENOUS
  Filled 2019-01-22: qty 2

## 2019-01-22 MED ORDER — SODIUM CHLORIDE 0.9 % IV BOLUS
1000.0000 mL | Freq: Once | INTRAVENOUS | Status: AC
  Administered 2019-01-22: 1000 mL via INTRAVENOUS

## 2019-01-22 MED ORDER — ALUM & MAG HYDROXIDE-SIMETH 200-200-20 MG/5ML PO SUSP
30.0000 mL | Freq: Once | ORAL | Status: AC
  Administered 2019-01-22: 30 mL via ORAL
  Filled 2019-01-22: qty 30

## 2019-01-22 MED ORDER — SODIUM CHLORIDE 0.9% FLUSH: 3.0000 mL | Freq: Once | INTRAVENOUS | Status: DC

## 2019-01-22 NOTE — ED Provider Notes (Signed)
[facility] EMERGENCY DEPARTMENT Provider Note  CSN: [id number] Arrival date & time: 01/22/19 1600  Chief Complaint(s) Abdominal Pain and Diarrhea  HPI Austin [name] is a 53 y.o. male with past medical history listed below including alcohol related pancreatitis who presents to the emergency department with several days of epigastric/RUQ abdominal pain with nausea and nonbloody nonbilious emesis.  He reports that the pain radiates around the right side into his back.  Patient also with loose stools.  Pain is exacerbated with oral intake.  He reports decreased oral intake due to lack of appetite and pain.  States that the last time he drank alcohol was approximately 1 to 2 weeks ago.  No fevers or chills.  No chest pain or shortness of breath.  No other physical complaints.  HPI  Past Medical History Past Medical History:  Diagnosis Date   Alcohol abuse    Anxiety    Arthritis    Blind in both eyes    sees lights and shadows   Elevated LFTs    Foot and toe(s), blister    bilat feet, blistering between toes   GERD (gastroesophageal reflux disease)    Glaucoma    Headache(784.0)    Hypertension    hx of   Insomnia    Pancreatitis    UTI (lower urinary tract infection) 08/04/11   finishing abx tomorrow   Weight loss    25 lb wt loss since 08/2010   Wrist fracture, right    Patient Active Problem List   Diagnosis Date Noted   Intractable nausea and vomiting 29/47/6546   Alcoholic pancreatitis 50/35/4656   Essential hypertension 04/14/2018   Blindness of both eyes 04/14/2018   Alcohol use 04/14/2018   Hyponatremia 04/14/2018   RUQ pain    Abnormal CT of the abdomen    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 Authorizing Provider  amLODipine (NORVASC) 5 MG tablet Take 5 mg by  mouth daily.    [provider]  folic acid (FOLVITE) 1 MG tablet Take 1 tablet (1 mg total) by mouth daily. 04/16/18   Regalado, Belkys A, MD  mirtazapine (REMERON) 15 MG tablet Take 15 mg by mouth at bedtime.    [provider]  Misc Natural Products (GINSENG COMPLEX PO) Take 1 capsule by mouth 2 (two) times a day.    [provider]  Multiple Vitamin (MULTIVITAMIN WITH MINERALS) TABS tablet Take 1 tablet by mouth daily. Patient not taking: Reported on 12/29/2018 04/16/18   Regalado, Jerald Kief A, MD  omeprazole (PRILOSEC) 40 MG capsule Take 40 mg by mouth 2 (two) times a day.     [provider]  ondansetron (ZOFRAN ODT) 4 MG disintegrating tablet Take 1 tablet (4 mg total) by mouth every 8 (eight) hours as needed for up to 3 days for nausea or vomiting. 01/23/19 01/26/19  Regina Ganci, Grayce Sessions, MD  ondansetron (ZOFRAN) 4 MG tablet Take 1 tablet (4 mg total) by mouth every 6 (six) hours as needed for nausea. 12/31/18   Cherylann Ratel A, DO  Past Surgical History Past Surgical History:  Procedure Laterality Date   ESOPHAGOGASTRODUODENOSCOPY     ESOPHAGOGASTRODUODENOSCOPY (EGD) WITH PROPOFOL N/A 05/09/2017   Procedure: ESOPHAGOGASTRODUODENOSCOPY (EGD) WITH PROPOFOL;  Surgeon: Doran Stabler, MD;  Location: [facility];  Service: Gastroenterology;  Laterality: N/A;   EYE SURGERY Left    EYE SURGERY Right    prostetic eye   foot srugery     removal of bone near small toe   FRACTURE SURGERY Right    wrist   HARDWARE REMOVAL  08/06/2011   Procedure: HARDWARE REMOVAL;  Surgeon: Newt Minion, MD;  Location: [facility];  Service: Orthopedics;  Laterality: Right;  Removal Deep Retained Hardware Right Distal Radius   MULTIPLE TOOTH EXTRACTIONS     for dentures   Family History Family History  Problem Relation Age of Onset   Diabetes  Sister    Diabetes Maternal Aunt        aunt and uncles   Hypertension Sister        aunt and uncles maternal side   Colon cancer Neg Hx     Social History Social History   Tobacco Use   Smoking status: Current Every Day Smoker    Packs/day: 1.00    Types: Cigarettes, Cigars   Smokeless tobacco: Never Used   Tobacco comment: 1/2 - 1 cigar/day  Substance Use Topics   Alcohol use: Yes    Alcohol/week: 7.0 standard drinks    Types: 7 Standard drinks or equivalent per week   Drug use: Yes    Types: Marijuana    Comment: smokes marijuana 1 to 2 times daily   Allergies Patient has no known allergies.  Review of Systems Review of Systems All other systems are reviewed and are negative for acute change except as noted in the HPI   Physical Exam Vital Signs  I have reviewed the triage vital signs BP (!) 143/89    Pulse 69    Temp 99.1 F (37.3 C) (Oral)    Resp 20    Ht 5\' 7"  (1.702 m)    Wt 68 kg    SpO2 99%    BMI 23.48 kg/m   Physical Exam Vitals signs reviewed.  Constitutional:      General: He is not in acute distress.    Appearance: He is well-developed. He is not diaphoretic.  HENT:     Head: Normocephalic and atraumatic.     Jaw: No trismus.     Right Ear: External ear normal.     Left Ear: External ear normal.     Nose: Nose normal.  Eyes:     Conjunctiva/sclera:     Left eye: Chemosis present.     Comments: Right eye ptosis. Left cornea hazy.  Neck:     Musculoskeletal: Normal range of motion.     Trachea: Phonation normal.  Cardiovascular:     Rate and Rhythm: Normal rate and regular rhythm.  Pulmonary:     Effort: Pulmonary effort is normal. No respiratory distress.     Breath sounds: No stridor.  Abdominal:     General: There is no distension.     Tenderness: There is abdominal tenderness in the right upper quadrant and epigastric area. There is no guarding or rebound. Negative signs include McBurney's sign.  Musculoskeletal: Normal range  of motion.  Neurological:     Mental Status: He is alert and oriented to person, place, and time.  Psychiatric:  Behavior: Behavior normal.     ED Results and Treatments Labs (all labs ordered are listed, but only abnormal results are displayed) Labs Reviewed  COMPREHENSIVE METABOLIC PANEL - Abnormal; Notable for the following components:      Result Value   Sodium 133 (*)    Chloride 97 (*)    Glucose, Bld 112 (*)    Creatinine, Ser 1.59 (*)    GFR calc non Af Amer 49 (*)    GFR calc Af Amer 57 (*)    All other components within normal limits  CBC  LIPASE, BLOOD  TROPONIN I (HIGH SENSITIVITY)  TROPONIN I (HIGH SENSITIVITY)                                                                                                                         EKG  EKG Interpretation  Date/Time:  Monday January 22 2019 16:17:35 EDT Ventricular Rate:  82 PR Interval:  138 QRS Duration: 80 QT Interval:  376 QTC Calculation: 439 R Axis:   33 Text Interpretation:  Normal sinus rhythm Normal ECG No significant change since last tracing Confirmed by Addison Lank [phone]) on 01/22/2019 11:16:40 PM      Radiology Dg Chest 2 View  Result Date: 01/22/2019 CLINICAL DATA:  Chest pain for 1 day EXAM: CHEST - 2 VIEW COMPARISON:  12/27/2018 FINDINGS: The heart size and mediastinal contours are within normal limits. Both lungs are clear. The visualized skeletal structures are unremarkable. IMPRESSION: No active cardiopulmonary disease. Electronically Signed   By: Inez Catalina M.D.   On: 01/22/2019 16:43    Pertinent labs & imaging results that were available during my care of the patient were reviewed by me and considered in my medical decision making (see chart for details).  Medications Ordered in ED Medications  sodium chloride flush (NS) 0.9 % injection 3 mL (has no administration in time range)  sodium chloride 0.9 % bolus 1,000 mL (1,000 mLs Intravenous New Bag/Given 01/22/19 2336)  alum &  mag hydroxide-simeth (MAALOX/MYLANTA) 200-200-20 MG/5ML suspension 30 mL (30 mLs Oral Given 01/22/19 2336)    And  lidocaine (XYLOCAINE) 2 % viscous mouth solution 15 mL (15 mLs Oral Given 01/22/19 2337)  ondansetron (ZOFRAN) injection 4 mg (4 mg Intravenous Given 01/22/19 2346)  Procedures Procedures  (including critical care time)  Medical Decision Making / ED Course I have reviewed the nursing notes for this encounter and the patient's prior records (if available in EHR or on provided paperwork).   Austin [name] was evaluated in Emergency Department on 01/23/2019 for the symptoms described in the history of present illness. He was evaluated in the context of the global COVID-19 pandemic, which necessitated consideration that the patient might be at risk for infection with the SARS-CoV-2 virus that causes COVID-19. Institutional protocols and algorithms that pertain to the evaluation of patients at risk for COVID-19 are in a state of rapid change based on information released by regulatory bodies including the CDC and federal and state organizations. These policies and algorithms were followed during the patient's care in the ED.  Patient presents with epigastric and right abdominal pain.  Similar to prior presentations related to pancreatitis.  Patient reports that he change his diet recently stopped eating greasy foods but has been eating tacos and pasta with tomato sauce.   Patient does have mild epigastric and right upper quadrant tenderness to palpation without signs of peritonitis.  No Murphy sign.  Screening labs grossly reassuring without leukocytosis or anemia.  No significant electrolyte derangements.  Patient does have mild AKI likely due to dehydration.  No evidence of biliary obstruction or acute pancreatitis.  Patient provided with IV fluids, antiemetics,  GI cocktail.  Low suspicion for serious intra-abdominal inflammatory/infectious process or bowel obstruction requiring imaging at this time.   Given epigastric pain, cardiac work-up was obtained in triage which is grossly reassuring with a nonischemic EKG and negative troponins x2.  Pain improved s/p Gi cocktail. Tolerated oral intake.   The patient appears reasonably screened and/or stabilized for discharge and I doubt any other medical condition or other [facility] requiring further screening, evaluation, or treatment in the ED at this time prior to discharge.  The patient is safe for discharge with strict return precautions.       Final Clinical Impression(s) / ED Diagnoses Final diagnoses:  Epigastric abdominal pain  Other acute gastritis, presence of bleeding unspecified    The patient appears reasonably screened and/or stabilized for discharge and I doubt any other medical condition or other [facility] requiring further screening, evaluation, or treatment in the ED at this time prior to discharge.  Disposition: Discharge  Condition: Good  I have discussed the results, Dx and Tx plan with the patient who expressed understanding and agree(s) with the plan. Discharge instructions discussed at great length. The patient was given strict return precautions who verbalized understanding of the instructions. No further questions at time of discharge.    ED Discharge Orders         Ordered    ondansetron (ZOFRAN ODT) 4 MG disintegrating tablet  Every 8 hours PRN     01/23/19 0114            Follow Up: Care, Jinny Blossom [facility] [address] [place] [address] [phone]  Schedule an appointment as soon as possible for a visit  As needed      This chart was dictated using voice recognition software.  Despite best efforts to proofread,  errors can occur which can change the documentation meaning.   Fatima Blank, MD 01/23/19 [phone]

## 2019-01-22 NOTE — ED Triage Notes (Signed)
PT states diarrhea, epigastric pain, nausea since Thursday.  States hx of same and was told he had pancreatitis.

## 2019-01-23 DIAGNOSIS — K2900 Acute gastritis without bleeding: Secondary | ICD-10-CM | POA: Diagnosis not present

## 2019-01-23 LAB — TROPONIN I (HIGH SENSITIVITY): Troponin I (High Sensitivity): 6 ng/L (ref <18)

## 2019-01-23 MED ORDER — ONDANSETRON 4 MG PO TBDP: 4.0000 mg | ORAL_TABLET | Freq: Three times a day (TID) | ORAL | 0 refills | Status: AC | PRN

## 2019-01-23 NOTE — ED Notes (Signed)
Patient verbalizes understanding of discharge instructions. Opportunity for questioning and answers were provided. Armband removed by staff, pt discharged from ED ambulatory with cab voucher provided.

## 2019-01-25 ENCOUNTER — Encounter: Payer: Self-pay | Admitting: Physician Assistant

## 2019-02-07 ENCOUNTER — Encounter: Payer: Self-pay | Admitting: Physician Assistant

## 2019-02-07 ENCOUNTER — Ambulatory Visit (INDEPENDENT_AMBULATORY_CARE_PROVIDER_SITE_OTHER): Payer: Medicare Other | Admitting: Physician Assistant

## 2019-02-07 ENCOUNTER — Other Ambulatory Visit: Payer: Self-pay

## 2019-02-07 VITALS — BP 130/100 | HR 86 | Temp 98.4°F | Ht 67.0 in | Wt 156.1 lb

## 2019-02-07 DIAGNOSIS — R1011 Right upper quadrant pain: Secondary | ICD-10-CM

## 2019-02-07 DIAGNOSIS — F1010 Alcohol abuse, uncomplicated: Secondary | ICD-10-CM

## 2019-02-07 DIAGNOSIS — K852 Alcohol induced acute pancreatitis without necrosis or infection: Secondary | ICD-10-CM | POA: Diagnosis not present

## 2019-02-07 MED ORDER — PANCRELIPASE (LIP-PROT-AMYL) 36000-114000 UNITS PO CPEP: ORAL_CAPSULE | ORAL | 1 refills | Status: DC

## 2019-02-07 MED ORDER — TRAMADOL HCL 50 MG PO TABS: 50.0000 mg | ORAL_TABLET | Freq: Four times a day (QID) | ORAL | 1 refills | Status: DC | PRN

## 2019-02-07 NOTE — Addendum Note (Signed)
Addended by: Wyline Beady on: 02/07/2019 10:42 AM   Modules accepted: Orders

## 2019-02-07 NOTE — Progress Notes (Signed)
Chief Complaint: Abdominal pain, nausea, diarrhea and weight loss  HPI:    [name] is a 53 year old African-American male with a past medical history as listed below, known to Dr. Loletha Carrow for right upper quadrant pain, who was referred to me by Care, Lenord Carbo* for a complaint of abdominal pain, nausea, diarrhea and weight loss.      05/06/2017 office visit with Dr. Loletha Carrow for right-sided abdominal pain, gas and weight loss.  At that time referred for dysphagia, abdominal pain and alcohol abuse.  Was thought he possibly had a duodenal ulcer from NSAID use.  It was unclear if he is on all of his medications.  Tried to ensure he was on Pantoprazole twice daily.  Scheduled for an EGD.  Was also discussed that he should have a routine colonoscopy at a later date.    05/09/2017 EGD was normal.  It was noted that the suspected duodenitis was actually pancreatitis.  He was told to discontinue alcohol indefinitely.    12/30/2018 admitted to the hospital with alcohol induced acute pancreatitis.  CT of the abdomen and pelvis with evidence of recurrent acute pancreatitis and nonobstructive left-sided nephrolithiasis.  Right upper quadrant ultrasound the day before was normal.  Lipase elevated at 56 on 12/29/2018.  COVID negative.    01/22/2019 ER visit for epigastric/right upper quadrant abdominal pain with nausea and nonbloody emesis.  Described last alcohol intake 1 to 2 weeks prior.  At that time CMP with a creatinine elevated at 1.59 and otherwise normal.  Normal CBC, lipase and troponins.  He was given IV fluids, antiemetics and GI cocktail.  Pain improved status post GI cocktail and he tolerated oral intake.    Today, the patient explains that he continues with a right upper quadrant/epigastric abdominal pain which radiates around his right side and through to his back.  This has continued since his ER visit on 12/30/18 when he was diagnosed with pancreatitis.  Patient tells me that since that time he has  stopped alcohol use and is currently on a soft food/liquid diet.  Although he does admit to eating a lot of cheese and butter and fats.  Tells me this pain seems to increase from a 6/10 up to 8-9/10, worse at night when he tries to lay down.  Currently using his Omeprazole 40 mg twice daily but tells me taking medications is hard for him at home as he does have visual impairment.  He is trying to get a personal home health aide.  Associated symptoms include some loose stools occasionally and about a 5 pound weight loss.  Patient tells me he is done with alcohol.    Denies fever, chills, blood in his stool, anorexia or vomiting.  Past Medical History:  Diagnosis Date  . Alcohol abuse   . Anxiety   . Arthritis   . 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 Elevated LFTs   . Foot and toe(s), blister    bilat feet, blistering between toes  . GERD (gastroesophageal reflux disease)   . Glaucoma   . Headache(784.0)   . Hypertension    hx of  . Insomnia   . Pancreatitis   . UTI (lower urinary tract infection) 08/04/11   finishing abx tomorrow  . Weight loss    25 lb wt loss since 08/2010  . Wrist fracture, right     Past Surgical History:  Procedure Laterality Date  . ESOPHAGOGASTRODUODENOSCOPY    .  ESOPHAGOGASTRODUODENOSCOPY (EGD) WITH PROPOFOL N/A 05/09/2017   Procedure: ESOPHAGOGASTRODUODENOSCOPY (EGD) WITH PROPOFOL;  Surgeon: Doran Stabler, MD;  Location: [facility];  Service: Gastroenterology;  Laterality: N/A;  . EYE SURGERY Left   . EYE SURGERY Right    prostetic eye  . foot srugery     removal of bone near small toe  . FRACTURE SURGERY Right    wrist  . HARDWARE REMOVAL  08/06/2011   Procedure: HARDWARE REMOVAL;  Surgeon: Newt Minion, MD;  Location: [place];  Service: Orthopedics;  Laterality: Right;  Removal Deep Retained Hardware Right Distal Radius  . MULTIPLE TOOTH EXTRACTIONS     for dentures    Current Outpatient Medications  Medication Sig Dispense Refill   . amLODipine (NORVASC) 5 MG tablet Take 5 mg by mouth daily.    . folic acid (FOLVITE) 1 MG tablet Take 1 tablet (1 mg total) by mouth daily. 30 tablet 0  . mirtazapine (REMERON) 15 MG tablet Take 15 mg by mouth at bedtime.    . Multiple Vitamin (MULTIVITAMIN WITH MINERALS) TABS tablet Take 1 tablet by mouth daily. 30 tablet 0  . omeprazole (PRILOSEC) 40 MG capsule Take 40 mg by mouth 2 (two) times a day.      No current facility-administered medications for this visit.     Allergies as of 02/07/2019  . (No Known Allergies)    Family History  Problem Relation Age of Onset  . Diabetes Sister   . Diabetes Maternal Aunt        aunt and uncles  . Hypertension Sister        aunt and uncles maternal side  . Colon cancer Neg Hx     Social History   Socioeconomic History  . Marital status: Significant Other    Spouse name: Not on file  . Number of children: 1  . Years of education: Not on file  . Highest education level: Not on file  Occupational History  . Occupation: disabled  Social Needs  . Financial resource strain: Not on file  . Food insecurity    Worry: Not on file    Inability: Not on file  . Transportation needs    Medical: Not on file    Non-medical: Not on file  Tobacco Use  . Smoking status: Current Every Day Smoker    Packs/day: 1.00    Types: Cigarettes, Cigars  . Smokeless tobacco: Never Used  . Tobacco comment: 1/2 - 1 cigar/day  Substance and Sexual Activity  . Alcohol use: Not Currently    Alcohol/week: 7.0 standard drinks    Types: 7 Standard drinks or equivalent per week    Frequency: Never  . Drug use: Yes    Types: Marijuana    Comment: smokes marijuana 1 to 2 times daily  . Sexual activity: Not Currently  Lifestyle  . Physical activity    Days per week: Not on file    Minutes per session: Not on file  . Stress: Not on file  Relationships  . Social Herbalist on phone: Not on file    Gets together: Not on file    Attends  religious service: Not on file    Active member of club or organization: Not on file    Attends meetings of clubs or organizations: Not on file    Relationship status: Not on file  . Intimate partner violence    Fear of current or ex partner: Not on file  Emotionally abused: Not on file    Physically abused: Not on file    Forced sexual activity: Not on file  Other Topics Concern  . Not on file  Social History Narrative  . Not on file    Review of Systems:    Constitutional: No fever or chills Cardiovascular: No chest pain Respiratory: No SOB Gastrointestinal: See HPI and otherwise negative   Physical Exam:  Vital signs: BP (!) 130/100 (BP Location: Left Arm, Patient Position: Sitting, Cuff Size: Normal) Comment: Patient ran out of BP meds  Pulse 86   Temp 98.4 F (36.9 C)   Ht 5\' 7"  (1.702 m)   Wt 156 lb 2 oz (70.8 kg)   BMI 24.45 kg/m   Constitutional:   Pleasant AA male appears to be in NAD, Well developed, Well nourished, alert and cooperative Head:  Normocephalic and atraumatic. Eyes:   PEERL, EOMI. No icterus. Conjunctiva pink. Visual impairment Ears:  Normal auditory acuity. Neck:  Supple Throat: Oral cavity and pharynx without inflammation, swelling or lesion.  Respiratory: Respirations even and unlabored. Lungs clear to auscultation bilaterally.   No wheezes, crackles, or rhonchi.  Cardiovascular: Normal S1, S2. No MRG. Regular rate and rhythm. No peripheral edema, cyanosis or pallor.  Gastrointestinal:  Soft,mild distension RUQ, Moderate Epigastric and RUQ ttp. No rebound or guarding. Normal bowel sounds. No appreciable masses or hepatomegaly. Rectal:  Not performed.  Msk:  Symmetrical without gross deformities. Without edema, no deformity or joint abnormality.  Neurologic:  Alert and  oriented x4;  grossly normal neurologically.  Skin:   Dry and intact without significant lesions or rashes. Psychiatric: Demonstrates good judgement and reason without abnormal  affect or behaviors.  RELEVANT LABS AND IMAGING: CBC    Component Value Date/Time   WBC 5.8 01/22/2019 1645   RBC 4.77 01/22/2019 1645   HGB 15.1 01/22/2019 1645   HCT 43.7 01/22/2019 1645   PLT 207 01/22/2019 1645   MCV 91.6 01/22/2019 1645   MCH 31.7 01/22/2019 1645   MCHC 34.6 01/22/2019 1645   RDW 12.4 01/22/2019 1645   LYMPHSABS 1.7 12/29/2018 0456   MONOABS 0.6 12/29/2018 0456   EOSABS 0.2 12/29/2018 0456   BASOSABS 0.0 12/29/2018 0456    CMP     Component Value Date/Time   NA 133 (L) 01/22/2019 1645   K 3.7 01/22/2019 1645   CL 97 (L) 01/22/2019 1645   CO2 23 01/22/2019 1645   GLUCOSE 112 (H) 01/22/2019 1645   BUN 10 01/22/2019 1645   CREATININE 1.59 (H) 01/22/2019 1645   CALCIUM 9.2 01/22/2019 1645   PROT 7.7 01/22/2019 1645   ALBUMIN 4.2 01/22/2019 1645   AST 21 01/22/2019 1645   ALT 14 01/22/2019 1645   ALKPHOS 92 01/22/2019 1645   BILITOT 0.9 01/22/2019 1645   GFRNONAA 49 (L) 01/22/2019 1645   GFRAA 57 (L) 01/22/2019 1645    Assessment: 1.  Right upper quadrant/epigastric pain: Continues per patient, likely related to acute pancreatitis 2.  Acute pancreatitis: Diagnosed via CT at the end of July, thought alcohol induced with normal ultrasound, patient has since stopped drinking alcohol per him but continues with some pain as above, likely still related  Plan: 1.  Discussed with patient that his continued pain is likely still from pancreatitis.  He did have recent right upper quadrant ultrasound which was normal/negative for gallbladder etiology and a CT showing pancreatitis.  Patient has history of alcohol abuse.  He has stopped drinking over the past 4  weeks and is on a liquid/soft food diet and symptoms are minimally improved. 2.  Prescribed Creon 36,000 units, 3 with a meal and 1 with a snack.  Enough for a month with 2 refills 3.  Prescribed Tramadol 50 mg every 6 hours as needed for pain.  #15 with 1 refill. 4.  Patient to continue Omeprazole 40 mg  twice daily, 30-60 minutes before eating breakfast and dinner. 5.  Would recommend patient have a personal home health aide.  Will ensure that we send a note to patient's primary care as they are working on this. 6.  Discussed with patient that if symptoms worsen or if he starts with nausea, vomiting or inability to eat or fever he should proceed to the ER. 7.  Patient to return to clinic in 8 weeks with me or Dr. Loletha Carrow.  At that time hopefully epigastric symptoms will be better and pancreatitis will have resolved and we can discuss screening colonoscopy.  Ellouise Newer, PA-C [facility] 02/07/2019, 9:56 AM  Cc: Care, Lenord Carbo*

## 2019-02-07 NOTE — Patient Instructions (Addendum)
Continue Omeprazole 40 mg twice a day.   Stay on a liquid diet until the pain resolves then progress to a low fat until pain relieves.   We have sent the following medications to your pharmacy for you to pick up at your convenience: Tramadol 50 mg every 6 hours as needed for pain   Creon 36,000 three times a day with meals/one with snack for one month  Full Liquid Diet A full liquid diet refers to fluids and foods that are liquid or will become liquid at room temperature. This diet should only be used for a short period of time to help you recover from illness or surgery. Your health care provider or dietitian will help you determine when it is safe to eat regular foods. What are tips for following this plan?     Reading food labels  Check food labels of nutrition shakes for the amount of protein. Look for nutrition shakes that have at least 8-10 grams of protein in each serving.  Look for drinks, such as milks and juices, that are "fortified" or "enriched." This means that vitamins and minerals have been added. Shopping  Buy premade nutritional shakes to keep on hand.  To vary your choices, buy different flavors of milks and shakes. Meal planning  Choose flavors and foods that you enjoy.  To make sure you get enough energy from food (calories): ? Eat 3 full liquid meals each day. Have a liquid snack between each meal. ? Drink 6-8 ounces (177-237 ml) of a nutrition supplement shake with meals or as snacks. ? Add protein powder, powdered milk, milk, or yogurt to shakes to increase the amount of protein.  Drink at least one serving a day of citrus fruit juice or fruit juice that has vitamin C added. General guidelines  Before starting the full-liquid diet, check with your health care provider to know what foods you should avoid. These may include full-fat or high-fiber liquids.  You may have any liquid or food that becomes a liquid at room temperature. The food is considered a  liquid if it can be poured off a spoon at room temperature.  Do not drink alcohol unless approved by your health care provider.  This diet gives you most of the nutrients that you need for energy, but you may not get enough of certain vitamins, minerals, and fiber. Make sure to talk to your health care provider or dietitian about: ? How many calories you need to eat get day. ? How much fluid you should have each day. ? Taking a multivitamin or a nutritional supplement. What foods are allowed? The items listed may not be a complete list. Talk with your dietitian about what dietary choices are best for you. Grains Thin hot cereal, such as cream of wheat. Soft-cooked pasta or rice pured in soup. Vegetables Pulp-free tomato or vegetable juice. Vegetables pured in soup. Fruits Fruit juice without pulp. Strained fruit pures (seeds and skins removed). Meats and other protein foods Beef, chicken, and fish broths. Powdered protein supplements. Dairy Milk and milk-based beverages, including milk shakes and instant breakfast mixes. Smooth yogurt. Pured cottage cheese. Beverages Water. Coffee and tea (caffeinated or decaffeinated). Cocoa. Liquid nutritional supplements. Soft drinks. Nondairy milks, such as almond, coconut, rice, or soy milk. Fats and oils Melted margarine and butter. Cream. Canola, almond, avocado, corn, grapeseed, sunflower, and sesame oils. Gravy. Sweets and desserts Custard. Pudding. Flavored gelatin. Smooth ice cream (without nuts or candy pieces). Sherbet. Popsicles. [place] ice. Pudding  pops. Seasoning and other foods Salt and pepper. Spices. Cocoa powder. Vinegar. Ketchup. Yellow mustard. Smooth sauces, such as Hollandaise, cheese sauce, or white sauce. Soy sauce. Cream soups. Strained soups. Syrup. Honey. Jelly (without fruit pieces). What foods are not allowed? The items listed may not be a complete list. Talk with your dietitian about what dietary choices are best for  you. Grains Whole grains. Pasta. Rice. Cold cereal. Bread. Crackers. Vegetables All whole fresh, frozen, or canned vegetables. Fruits All whole fresh, frozen, or canned fruits. Meats and other protein foods All cuts of meat, poultry, and fish. Eggs. Tofu and soy protein. Nuts and nut butters. Lunch meat. Sausage. Dairy Hard cheese. Yogurt with fruit chunks. Fats and oils Coconut oil. Palm oil. Lard. Cold butter. Sweets and desserts Ice cream or other frozen desserts that have any solids in them or on top, such as nuts, chocolate chips, and pieces of cookies. Cakes. Cookies. Candy. Seasoning and other foods Stone-ground mustards. Soups with chunks or pieces. Summary  A full liquid diet refers to fluids and foods that are liquid or will become liquid at room temperature.  This diet should only be used for a short period of time to help you recover from illness or surgery. Ask your health care provider or dietitian when it is safe for you to eat regular foods.  To make sure you get enough calories and nutrients, eat 3 meals each day with snacks between. Drink premade nutrition supplement shakes or add protein powder to homemade shakes. Take a vitamin and mineral supplement as told by your health care provider. This information is not intended to replace advice given to you by your health care provider. Make sure you discuss any questions you have with your health care provider. Document Released: 05/24/2005 Document Revised: 08/20/2017 Document Reviewed: 07/07/2016 Elsevier Patient Education  2020 [employer].

## 2019-02-07 NOTE — Progress Notes (Signed)
____________________________________________________________  Attending physician addendum:  Thank you for sending this case to me. I have reviewed the entire note, and the outlined plan seems appropriate.  Agree must stop drinking forever and yes, pancreatic enzymes worth at least an initial trial.  Two of the 36,000 unit capsules with meals and one with snack would be sufficient dosing. Relatively mild CT findings suggest unlikely to have developed complication such as pseudocyst at this point.  Wilfrid Lund, MD  ____________________________________________________________

## 2019-02-09 ENCOUNTER — Telehealth: Payer: Self-pay | Admitting: Gastroenterology

## 2019-02-09 NOTE — Telephone Encounter (Signed)
Are there any other home health care places we refer to?  Otherwise I guess we need to call PCP and try to get through to them?   Thanks JLL

## 2019-02-09 NOTE — Telephone Encounter (Signed)
[facility] called to inform that they cannot accept this pt from referral--they "don't have the staff."

## 2019-02-09 NOTE — Telephone Encounter (Signed)
Please advise if there is another alternative for patient?

## 2019-02-13 NOTE — Telephone Encounter (Signed)
Pt would like to use [facility]

## 2019-02-13 NOTE — Telephone Encounter (Signed)
Called and spoke to patient, he states he is at work right now but will call back and let us know the name of another home health care agency.

## 2019-02-14 NOTE — Telephone Encounter (Signed)
Form for services sent in to [facility].

## 2019-03-28 ENCOUNTER — Encounter: Payer: Self-pay | Admitting: Podiatry

## 2019-03-28 ENCOUNTER — Other Ambulatory Visit: Payer: Self-pay

## 2019-03-28 ENCOUNTER — Ambulatory Visit (INDEPENDENT_AMBULATORY_CARE_PROVIDER_SITE_OTHER): Payer: Medicare Other | Admitting: Podiatry

## 2019-03-28 DIAGNOSIS — Q828 Other specified congenital malformations of skin: Secondary | ICD-10-CM

## 2019-03-28 DIAGNOSIS — B351 Tinea unguium: Secondary | ICD-10-CM | POA: Diagnosis not present

## 2019-03-28 DIAGNOSIS — M79672 Pain in left foot: Secondary | ICD-10-CM

## 2019-03-28 DIAGNOSIS — M79676 Pain in unspecified toe(s): Secondary | ICD-10-CM

## 2019-03-28 DIAGNOSIS — M79671 Pain in right foot: Secondary | ICD-10-CM

## 2019-03-28 DIAGNOSIS — L84 Corns and callosities: Secondary | ICD-10-CM | POA: Diagnosis not present

## 2019-03-28 NOTE — Patient Instructions (Signed)
Corns and Calluses Corns are small areas of thickened skin that occur on the top, sides, or tip of a toe. They contain a cone-shaped Conkey with a point that can press on a nerve below. This causes pain.  Calluses are areas of thickened skin that can occur anywhere on the body, including the hands, fingers, palms, soles of the feet, and heels. Calluses are usually larger than corns. What are the causes? Corns and calluses are caused by rubbing (friction) or pressure, such as from shoes that are too tight or do not fit properly. What increases the risk? Corns are more likely to develop in people who have misshapen toes (toe deformities), such as hammer toes. Calluses can occur with friction to any area of the skin. They are more likely to develop in people who:  Work with their hands.  Wear shoes that fit poorly, are too tight, or are high-heeled.  Have toe deformities. What are the signs or symptoms? Symptoms of a corn or callus include:  A hard growth on the skin.  Pain or tenderness under the skin.  Redness and swelling.  Increased discomfort while wearing tight-fitting shoes, if your feet are affected. If a corn or callus becomes infected, symptoms may include:  Redness and swelling that gets worse.  Pain.  Fluid, blood, or pus draining from the corn or callus. How is this diagnosed? Corns and calluses may be diagnosed based on your symptoms, your medical history, and a physical exam. How is this treated? Treatment for corns and calluses may include:  Removing the cause of the friction or pressure. This may involve: ? Changing your shoes. ? Wearing shoe inserts (orthotics) or other protective layers in your shoes, such as a corn pad. ? Wearing gloves.  Applying medicine to the skin (topical medicine) to help soften skin in the hardened, thickened areas.  Removing layers of dead skin with a file to reduce the size of the corn or callus.  Removing the corn or callus with a  scalpel or laser.  Taking antibiotic medicines, if your corn or callus is infected.  Having surgery, if a toe deformity is the cause. Follow these instructions at home:   Take over-the-counter and prescription medicines only as told by your health care provider.  If you were prescribed an antibiotic, take it as told by your health care provider. Do not stop taking it even if your condition starts to improve.  Wear shoes that fit well. Avoid wearing high-heeled shoes and shoes that are too tight or too loose.  Wear any padding, protective layers, gloves, or orthotics as told by your health care provider.  Soak your hands or feet and then use a file or pumice stone to soften your corn or callus. Do this as told by your health care provider.  Check your corn or callus every day for symptoms of infection. Contact a health care provider if you:  Notice that your symptoms do not improve with treatment.  Have redness or swelling that gets worse.  Notice that your corn or callus becomes painful.  Have fluid, blood, or pus coming from your corn or callus.  Have new symptoms. Summary  Corns are small areas of thickened skin that occur on the top, sides, or tip of a toe.  Calluses are areas of thickened skin that can occur anywhere on the body, including the hands, fingers, palms, and soles of the feet. Calluses are usually larger than corns.  Corns and calluses are caused by   rubbing (friction) or pressure, such as from shoes that are too tight or do not fit properly.  Treatment may include wearing any padding, protective layers, gloves, or orthotics as told by your health care provider. This information is not intended to replace advice given to you by your health care provider. Make sure you discuss any questions you have with your health care provider. Document Released: 02/28/2004 Document Revised: 09/13/2018 Document Reviewed: 04/06/2017 Elsevier Patient Education  2020 Elsevier  Inc.  

## 2019-04-01 NOTE — Progress Notes (Signed)
Subjective: Austin [name] presents to clinic with cc of painful mycotic toenails and calluses both feet which are aggravated when weightbearing with and without shoe gear.  This pain limits his daily activities. Pain symptoms resolve with periodic professional debridement.  [facility] is his PCP.   Current Outpatient Medications on File Prior to Visit  Medication Sig Dispense Refill  . amLODipine (NORVASC) 5 MG tablet Take 5 mg by mouth daily.    Marland Kitchen atorvastatin (LIPITOR) 10 MG tablet     . busPIRone (BUSPAR) 5 MG tablet     . folic acid (FOLVITE) 1 MG tablet Take 1 tablet (1 mg total) by mouth daily. 30 tablet 0  . lipase/protease/amylase (CREON) 36000 UNITS CPEP capsule Three capsules with meals And one with snacks 300 capsule 1  . lisinopril (ZESTRIL) 10 MG tablet     . mirtazapine (REMERON) 15 MG tablet Take 15 mg by mouth at bedtime.    . Multiple Vitamin (MULTIVITAMIN WITH MINERALS) TABS tablet Take 1 tablet by mouth daily. 30 tablet 0  . omeprazole (PRILOSEC) 40 MG capsule Take 40 mg by mouth 2 (two) times a day.     . traMADol (ULTRAM) 50 MG tablet Take 1 tablet (50 mg total) by mouth every 6 (six) hours as needed. 15 tablet 1   No current facility-administered medications on file prior to visit.      No Known Allergies   Objective: Physical Examination:  Vascular  Examination: Capillary refill time immediate x 10 digits.  Palpable DP/PT pulses b/l.  Digital hair sparse b/l.  No edema noted b/l.  Skin temperature gradient WNL b/l.  Dermatological Examination: Skin with normal turgor, texture and tone b/l.  No open wounds b/l.  No interdigital macerations noted b/l.  Elongated, thick, discolored brittle toenails with subungual debris and pain on dorsal palpation of nailbeds 1-5 b/l.  Hyperkeratotic lesions submet heads 5 left foot and b/l hallux with tenderness to palpation. No edema, no erythema, no drainage, no flocculence.   Porokeratotic  lesions submet head 4 right foot with tenderness to palpation. No erythema, no edema, no drainage, no flocculence.   Musculoskeletal Examination: Muscle strength 5/5 to all muscle groups b/l.  No pain, crepitus or joint discomfort with active/passive ROM.  Neurological Examination: Sensation intact 5/5 b/l with 10 gram monofilament.  Vibratory sensation intact b/l.  Proprioceptive sensation intact b/l.  Assessment: 1. Mycotic nail infection with pain 1-5 b/l 2. Calluses b/l hallux and submet head 5 left foot 3. Porokeratosis submet head 4 right foot  Plan: 1. Toenails 1-5 b/l were debrided in length and girth without iatrogenic laceration.  2. Calluses pared submetatarsal head(s) 5 left foot and b/l hallux utilizing sterile scalpel blade without incident. Porokeratosis submet head 4 right foot pared and enucleated with sterile scalpel blade without incident. 3. Continue soft, supportive shoe gear daily. 4. Report any pedal injuries to medical professional. 5. Follow up 3 months. 6. Patient/POA to call should there be a question/concern in there interim.

## 2019-05-19 ENCOUNTER — Emergency Department (HOSPITAL_COMMUNITY)
Admission: EM | Admit: 2019-05-19 | Discharge: 2019-05-19 | Disposition: A | Discharge status: Home / Self Care | Payer: Medicare Other | Attending: Emergency Medicine | Admitting: Emergency Medicine

## 2019-05-19 ENCOUNTER — Emergency Department (HOSPITAL_COMMUNITY): Payer: Medicare Other

## 2019-05-19 ENCOUNTER — Other Ambulatory Visit: Payer: Self-pay

## 2019-05-19 ENCOUNTER — Encounter (HOSPITAL_COMMUNITY): Payer: Self-pay | Admitting: Emergency Medicine

## 2019-05-19 DIAGNOSIS — I10 Essential (primary) hypertension: Secondary | ICD-10-CM | POA: Insufficient documentation

## 2019-05-19 DIAGNOSIS — M87052 Idiopathic aseptic necrosis of left femur: Secondary | ICD-10-CM | POA: Insufficient documentation

## 2019-05-19 DIAGNOSIS — M25552 Pain in left hip: Secondary | ICD-10-CM | POA: Diagnosis present

## 2019-05-19 DIAGNOSIS — Z79899 Other long term (current) drug therapy: Secondary | ICD-10-CM | POA: Insufficient documentation

## 2019-05-19 DIAGNOSIS — F17210 Nicotine dependence, cigarettes, uncomplicated: Secondary | ICD-10-CM | POA: Insufficient documentation

## 2019-05-19 LAB — BASIC METABOLIC PANEL
Anion gap: 11 (ref 5–15)
BUN: 12 mg/dL (ref 6–20)
CO2: 24 mmol/L (ref 22–32)
Calcium: 9 mg/dL (ref 8.9–10.3)
Chloride: 100 mmol/L (ref 98–111)
Creatinine, Ser: 1.2 mg/dL (ref 0.61–1.24)
GFR calc Af Amer: >60 mL/min (ref >60)
GFR calc non Af Amer: >60 mL/min (ref >60)
Glucose, Bld: 98 mg/dL (ref 70–99)
Potassium: 4.2 mmol/L (ref 3.5–5.1)
Sodium: 135 mmol/L (ref 135–145)

## 2019-05-19 LAB — CBC
HCT: 45.7 % (ref 39.0–52.0)
Hemoglobin: 15.3 g/dL (ref 13.0–17.0)
MCH: 31.1 pg (ref 26.0–34.0)
MCHC: 33.5 g/dL (ref 30.0–36.0)
MCV: 92.9 fL (ref 80.0–100.0)
Platelets: 193 10*3/uL (ref 150–400)
RBC: 4.92 MIL/uL (ref 4.22–5.81)
RDW: 13.2 % (ref 11.5–15.5)
WBC: 5.6 10*3/uL (ref 4.0–10.5)
nRBC: 0 % (ref 0.0–0.2)

## 2019-05-19 MED ORDER — MELOXICAM 15 MG PO TABS: 15.0000 mg | ORAL_TABLET | Freq: Every day | ORAL | 0 refills | Status: DC

## 2019-05-19 MED ORDER — OXYCODONE-ACETAMINOPHEN 5-325 MG PO TABS
2.0000 | ORAL_TABLET | Freq: Once | ORAL | Status: AC
  Administered 2019-05-19: 17:00:00 2 via ORAL
  Filled 2019-05-19: qty 2

## 2019-05-19 MED ORDER — OXYCODONE-ACETAMINOPHEN 5-325 MG PO TABS: 1.0000 | ORAL_TABLET | Freq: Four times a day (QID) | ORAL | 0 refills | Status: DC | PRN

## 2019-05-19 MED ORDER — ONDANSETRON 4 MG PO TBDP
4.0000 mg | ORAL_TABLET | Freq: Once | ORAL | Status: AC
  Administered 2019-05-19: 4 mg via ORAL
  Filled 2019-05-19: qty 1

## 2019-05-19 NOTE — ED Provider Notes (Signed)
[facility] DEPT Provider Note   CSN: [id number] Arrival date & time: 05/19/19  1310     History Chief Complaint  Patient presents with  . Hip Pain  . Leg Pain    Austin [name] is a 53 y.o. male with a  pmh of glaucoma and visual impairment who presents with chief complaint of severe left hip pain.  Patient states that he began having a deep ache in his left hip approximately 1 month ago which has progressively worsened.  He now has severe pain anytime he tries to move the joint or bear weight.  He states that it aches all the way to his foot but does not hurt in any specific nerve pattern.  Patient denies weakness but states that sometimes his hip gives way and he falls.  Has tried over-the-counter pain medication and had a friend who gave him a single Percocet which he states, dulled the pain somewhat but did not do a lot for him.  HPI     Past Medical History:  Diagnosis Date  . Alcohol abuse   . Anxiety   . Arthritis   . 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 Elevated LFTs   . Foot and toe(s), blister    bilat feet, blistering between toes  . GERD (gastroesophageal reflux disease)   . Glaucoma   . Headache(784.0)   . Hypertension    hx of  . Insomnia   . Pancreatitis   . UTI (lower urinary tract infection) 08/04/11   finishing abx tomorrow  . Weight loss    25 lb wt loss since 08/2010  . Wrist fracture, right     Patient Active Problem List   Diagnosis Date Noted  . Intractable nausea and vomiting 12/29/2018  . 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 Essential hypertension 04/14/2018  . Blindness of both eyes 04/14/2018  . Alcohol use 04/14/2018  . Hyponatremia 04/14/2018  . RUQ pain   . Abnormal CT of the abdomen   . 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 ESOPHAGOGASTRODUODENOSCOPY    .  ESOPHAGOGASTRODUODENOSCOPY (EGD) WITH PROPOFOL N/A 05/09/2017   Procedure: ESOPHAGOGASTRODUODENOSCOPY (EGD) WITH PROPOFOL;  Surgeon: Doran Stabler, MD;  Location: [facility];  Service: Gastroenterology;  Laterality: N/A;  . EYE SURGERY Left   . EYE SURGERY Right    prostetic eye  . foot srugery     removal of bone near small toe  . FRACTURE SURGERY Right    wrist  . HARDWARE REMOVAL  08/06/2011   Procedure: HARDWARE REMOVAL;  Surgeon: Newt Minion, MD;  Location: [facility];  Service: Orthopedics;  Laterality: Right;  Removal Deep Retained Hardware Right Distal Radius  . MULTIPLE TOOTH EXTRACTIONS     for dentures       Family History  Problem Relation Age of Onset  . Diabetes Sister   . Diabetes Maternal Aunt        aunt and uncles  . Hypertension Sister        aunt and uncles maternal side  . Colon cancer Neg Hx     Social History   Tobacco Use  . Smoking status: Current Every Day Smoker    Packs/day: 1.00    Types: Cigarettes, Cigars  . Smokeless tobacco: Never Used  . Tobacco comment: 1/2 - 1 cigar/day  Substance Use Topics  . Alcohol use: Not Currently    Alcohol/week: 7.0 standard drinks    Types: 7 Standard drinks or equivalent per week  . Drug use: Yes    Types: Marijuana    Comment: smokes marijuana 1 to 2 times daily    Home Medications Prior to Admission medications   Medication Sig Start Date End Date Taking? Authorizing Provider  amLODipine (NORVASC) 5 MG tablet Take 5 mg by mouth daily.    [provider]  atorvastatin (LIPITOR) 10 MG tablet  02/09/19   [provider]  busPIRone (BUSPAR) 5 MG tablet  01/24/19   [provider]  folic acid (FOLVITE) 1 MG tablet Take 1 tablet (1 mg total) by mouth daily. 04/16/18   Regalado, Jerald Kief A, MD  lipase/protease/amylase (CREON) 36000 UNITS CPEP capsule Three capsules with meals And one with snacks 02/07/19   Levin Erp, PA  lisinopril (ZESTRIL) 10 MG tablet  02/09/19   [provider]  mirtazapine (REMERON) 15 MG tablet Take 15 mg by mouth at bedtime.    [provider]  Multiple Vitamin (MULTIVITAMIN WITH MINERALS) TABS tablet Take 1 tablet by mouth daily. 04/16/18   Regalado, Belkys A, MD  omeprazole (PRILOSEC) 40 MG capsule Take 40 mg by mouth 2 (two) times a day.     [provider]  traMADol (ULTRAM) 50 MG tablet Take 1 tablet (50 mg total) by mouth every 6 (six) hours as needed. 02/07/19   Levin Erp, PA    Allergies    Patient has no known allergies.  Review of Systems   Review of Systems .Ten systems reviewed and are negative for acute change, except as noted in the HPI.   Physical Exam Updated Vital Signs BP (!) 145/74 (BP Location: Right Arm)   Pulse 63   Temp 98.3 F (36.8 C) (Oral)   Resp 17   SpO2 98%   Physical Exam Vitals and nursing note reviewed.  Constitutional:      General: He is not in acute distress.    Appearance: He is well-developed. He is not diaphoretic.  HENT:     Head: Normocephalic and atraumatic.  Eyes:     General: No scleral icterus.    Conjunctiva/sclera: Conjunctivae normal.  Cardiovascular:     Rate and Rhythm: Normal rate and regular rhythm.     Heart sounds: Normal heart sounds.  Pulmonary:     Effort: Pulmonary effort is normal. No respiratory distress.     Breath sounds: Normal breath sounds.  Abdominal:     Palpations: Abdomen is soft.     Tenderness: There is no abdominal tenderness.  Musculoskeletal:     Cervical back: Normal range of motion and neck supple.     Comments: With limited range of motion of the left hip due to pain.  Pain with passive range of motion, worse with internal rotation.  Negative straight leg test.  Normal ipsilateral left knee and ankle examination.  PT and DP pulses 2+ bilaterally with normal sensation.  Skin:    General: Skin is warm and dry.  Neurological:     Mental Status: He is alert.  Psychiatric:        Behavior: Behavior normal.      ED Results / Procedures / Treatments   Labs (all labs ordered are listed, but only abnormal results are displayed) Labs Reviewed  BASIC METABOLIC PANEL  CBC  I-STAT CHEM 8, ED    EKG None  Radiology CT Hip Left Wo Contrast  Result Date: 05/19/2019 CLINICAL DATA:  Left hip pain. EXAM: CT OF THE LEFT HIP WITHOUT CONTRAST TECHNIQUE: Multidetector CT imaging of the left hip was performed according to the standard protocol. Multiplanar CT image reconstructions were also generated. 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 Joint spaces preserved. Small joint effusion. The acetabulum appears normal. Visualized pelvic bones are normal. Muscles and Tendons Normal. Soft tissues Normal. 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 The patient has similar findings of the right femoral head. Electronically Signed   By: Lorriane Shire M.D.   On: 05/19/2019 15:39   DG Hip Unilat With Pelvis 2-3 Views Left  Result Date: 05/19/2019 CLINICAL DATA:  Pt states left hip pain for 3 weeks. Feels constant ache but also feels sharp pain that radiate down left leg to toes upon movement. States no previous injury. EXAM: DG HIP (WITH OR WITHOUT PELVIS) 2-3V LEFT COMPARISON:  CT abdomen and pelvis, 12/30/2018. FINDINGS: No acute fracture. There is symmetric relative lucency in both femoral heads with a surrounding rim of sclerosis. This is stable from the prior CT and is consistent with bilateral avascular necrosis. There is no evidence of subchondral collapse. Hip joints are normally spaced and aligned as are the SI joints and symphysis pubis. Soft tissues are unremarkable. IMPRESSION: 1. Well-defined symmetric areas of lucency in both femoral heads with surrounding sclerosis. 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 No evidence of subchondral collapse.  This appearance is stable compared to the prior CT scan. 2. No fracture or acute finding. Electronically Signed   By: Lajean Manes M.D.   On: 05/19/2019 14:04    Procedures Procedures (including critical care time)  Medications Ordered in ED Medications - No data to display  ED Course  I have reviewed the triage vital signs and the nursing notes.  Pertinent labs & imaging results that were available during my care of the patient were reviewed by me and considered in my medical decision making (see chart for details).    MDM Rules/Calculators/A&P     CHA2DS2/VAS Stroke Risk Points      N/A >= 2 Points: High Risk  1 - 1.99 Points: Medium Risk  0 Points: Low Risk    A final score could not be computed because of missing components.: Last  Change: N/A     This score determines the patient's risk of having a stroke if the  patient has atrial fibrillation.      This score is not applicable to this patient. Components are not  calculated.                   53 year old male who presents emergency department with atraumatic left hip pain.  Differential includes osteoarthritis, radiculopathy, dvt, Cellulitis, AVN.  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  Given the patient's amount of pain proceeded with CT scan which showed no pathologic fracture however does has extensive and severe necrotic left femoral head secondary to AVN.  I discussed case with Dr. Lorin Mercy who is on-call for orthopedics who asked that the patient call his office first thing Monday morning.  Patient will have an appointment with him on Tuesday while he is in clinic.  Patient was given special accommodations as he is visually impaired and the doctors contact information was texted to his  phone for easy access.  Patient given anti-inflammatory medications, and Percocet after review of the PDMP.  Patient unable to use crutches. Final Clinical Impression(s) /  ED Diagnoses Final diagnoses:  None    Rx / DC Orders ED Discharge Orders    None       Margarita Mail, PA-C 05/19/19 2054    Julianne Rice, MD 05/19/19 2134

## 2019-05-19 NOTE — Discharge Instructions (Signed)
Contact a health care provider if: You have pain that gets worse or does not get better with medicine. Your ability to move your joint gets worse. Get help right away if: Your pain suddenly becomes severe.

## 2019-05-19 NOTE — ED Triage Notes (Signed)
Pt reports left hip pains for 3 weeks that pain radiates down left leg. Had multiple falls. Reports that has glaucoma, has walking stick.

## 2019-05-22 ENCOUNTER — Ambulatory Visit (INDEPENDENT_AMBULATORY_CARE_PROVIDER_SITE_OTHER): Payer: Medicare Other | Admitting: Orthopaedic Surgery

## 2019-05-22 ENCOUNTER — Other Ambulatory Visit: Payer: Self-pay

## 2019-05-22 ENCOUNTER — Encounter: Payer: Self-pay | Admitting: Orthopaedic Surgery

## 2019-05-22 DIAGNOSIS — M87052 Idiopathic aseptic necrosis of left femur: Secondary | ICD-10-CM | POA: Insufficient documentation

## 2019-05-22 MED ORDER — TRAMADOL HCL 50 MG PO TABS: 50.0000 mg | ORAL_TABLET | Freq: Four times a day (QID) | ORAL | 0 refills | Status: DC | PRN

## 2019-05-22 NOTE — H&P (Signed)
TOTAL HIP ADMISSION H&P  Patient is admitted for left total hip arthroplasty.  Subjective:  Chief Complaint: left hip pain  HPI: Austin [name], 53 y.o. male, has a history of pain and functional disability in the left hip(s) due to arthritis /avascular necrosis and patient has failed non-surgical conservative treatments for greater than 12 weeks to include use of assistive devices and activity modification.  Onset of symptoms was gradual starting 4 years ago with gradually worsening course since that time.The patient noted no past surgery on the left hip(s).  Patient currently rates pain in the left hip at 10 out of 10 with activity. Patient has night pain, worsening of pain with activity and weight bearing, trendelenberg gait, pain that interfers with activities of daily living, pain with passive range of motion, crepitus and joint swelling. Patient has evidence of subchondral cysts, subchondral sclerosis, periarticular osteophytes and joint space narrowing by imaging studies. This condition presents safety issues increasing the risk of falls.  There is no current active infection.  Patient Active Problem List   Diagnosis Date Noted  . Avascular necrosis of bone of hip, left ([facility]) 05/22/2019  . Intractable nausea and vomiting 12/29/2018  . 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 Essential hypertension 04/14/2018  . Blindness of both eyes 04/14/2018  . Alcohol use 04/14/2018  . Hyponatremia 04/14/2018  . RUQ pain   . Abnormal CT of the abdomen   . 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 Retained orthopedic hardware 08/07/2011   Past Medical History:  Diagnosis Date  . Alcohol abuse   . Anxiety   . Arthritis   . 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 Elevated LFTs   . Foot and toe(s), blister    bilat feet, blistering between toes  . GERD (gastroesophageal reflux disease)   . Glaucoma   . Headache(784.0)   . Hypertension    hx of  . Insomnia   . Pancreatitis   . UTI  (lower urinary tract infection) 08/04/11   finishing abx tomorrow  . Weight loss    25 lb wt loss since 08/2010  . Wrist fracture, right     Past Surgical History:  Procedure Laterality Date  . ESOPHAGOGASTRODUODENOSCOPY    . ESOPHAGOGASTRODUODENOSCOPY (EGD) WITH PROPOFOL N/A 05/09/2017   Procedure: ESOPHAGOGASTRODUODENOSCOPY (EGD) WITH PROPOFOL;  Surgeon: Doran Stabler, MD;  Location: [facility];  Service: Gastroenterology;  Laterality: N/A;  . EYE SURGERY Left   . EYE SURGERY Right    prostetic eye  . foot srugery     removal of bone near small toe  . FRACTURE SURGERY Right    wrist  . HARDWARE REMOVAL  08/06/2011   Procedure: HARDWARE REMOVAL;  Surgeon: Newt Minion, MD;  Location: [facility];  Service: Orthopedics;  Laterality: Right;  Removal Deep Retained Hardware Right Distal Radius  . MULTIPLE TOOTH EXTRACTIONS     for dentures    No current facility-administered medications for this encounter.   Current Outpatient Medications  Medication Sig Dispense Refill Last Dose  . amLODipine (NORVASC) 5 MG tablet Take 5 mg by mouth daily.     Marland Kitchen atorvastatin (LIPITOR) 10 MG tablet      . busPIRone (BUSPAR) 5 MG tablet      . folic acid (FOLVITE) 1 MG tablet Take 1 tablet (1 mg total) by mouth daily. 30 tablet 0   . lipase/protease/amylase (CREON) 36000 UNITS CPEP capsule Three capsules with meals And one with  snacks 300 capsule 1   . lisinopril (ZESTRIL) 10 MG tablet      . meloxicam (MOBIC) 15 MG tablet Take 1 tablet (15 mg total) by mouth daily. 30 tablet 0   . mirtazapine (REMERON) 15 MG tablet Take 15 mg by mouth at bedtime.     . Multiple Vitamin (MULTIVITAMIN WITH MINERALS) TABS tablet Take 1 tablet by mouth daily. 30 tablet 0   . omeprazole (PRILOSEC) 40 MG capsule Take 40 mg by mouth 2 (two) times a day.      . oxyCODONE-acetaminophen (PERCOCET) 5-325 MG tablet Take 1 tablet by mouth every 6 (six) hours as needed for severe pain. 12 tablet 0   . traMADol (ULTRAM) 50 MG tablet  Take 1 tablet (50 mg total) by mouth every 6 (six) hours as needed. (Patient not taking: Reported on 05/22/2019) 15 tablet 1   . traMADol (ULTRAM) 50 MG tablet Take 1 tablet (50 mg total) by mouth every 6 (six) hours as needed. 30 tablet 0    No Known Allergies  Social History   Tobacco Use  . Smoking status: Current Every Day Smoker    Packs/day: 1.00    Types: Cigarettes, Cigars  . Smokeless tobacco: Never Used  . Tobacco comment: 1/2 - 1 cigar/day  Substance Use Topics  . Alcohol use: Not Currently    Alcohol/week: 7.0 standard drinks    Types: 7 Standard drinks or equivalent per week    Family History  Problem Relation Age of Onset  . Diabetes Sister   . Diabetes Maternal Aunt        aunt and uncles  . Hypertension Sister        aunt and uncles maternal side  . Colon cancer Neg Hx      Review of Systems  Constitutional: Negative.   HENT: Negative.   Eyes:       Blind both eyes  Respiratory: Negative.   Gastrointestinal: Negative.   Genitourinary: Negative.   Musculoskeletal: Positive for gait problem.  Skin: Negative.   Psychiatric/Behavioral: Negative.     Objective:  Physical Exam  Constitutional: He is oriented to person, place, and time. No distress.  HENT:  Head: Normocephalic and atraumatic.  Eyes: EOM are normal.  Cardiovascular: Normal heart sounds.  No murmur heard. Respiratory: Effort normal. No respiratory distress. He has no wheezes.  GI: Soft. Bowel sounds are normal. He exhibits no distension. There is no abdominal tenderness.  Musculoskeletal:        General: Tenderness present.     Cervical back: Normal range of motion.  Neurological: He is alert and oriented to person, place, and time.  Skin: Skin is warm and dry.  Psychiatric: He has a normal mood and affect.    Vital signs in last 24 hours: Weight:  [70.8 kg] 70.8 kg (12/15 0958)  Labs:   Estimated body mass index is 24.43 kg/m as calculated from the following:   Height as of  05/22/19: 5\' 7"  (1.702 m).   Weight as of 05/22/19: 70.8 kg.   Imaging Review Plain radiographs demonstrate moderate degenerative joint disease with AVN of the left hip(s). The bone quality appears to be good for age and reported activity level.      Assessment/Plan:  End stage arthritis/AVN, left hip(s)  The patient history, physical examination, clinical judgement of the provider and imaging studies are consistent with end stage degenerative joint disease of the left hip(s) and total hip arthroplasty is deemed medically necessary. The treatment  options including medical management, injection therapy, arthroscopy and arthroplasty were discussed at length. The risks and benefits of total hip arthroplasty were presented and reviewed. The risks due to aseptic loosening, infection, stiffness, dislocation/subluxation,  thromboembolic complications and other imponderables were discussed.  The patient acknowledged the explanation, agreed to proceed with the plan and consent was signed. Patient is being admitted for inpatient treatment for surgery, pain control, PT, OT, prophylactic antibiotics, VTE prophylaxis, progressive ambulation and ADL's and discharge planning.The patient is planning to be discharged home with home health services

## 2019-05-22 NOTE — Progress Notes (Signed)
Office Visit Note   Patient: Austin [name]           Date of Birth: 1965/06/17      x     MRN: [id number] Visit Date: 05/22/2019              Requested by: [facility], Jinny [facility] [address] [place],  [place] [address] PCP: [facility], [facility]   Assessment & Plan: Visit Diagnoses:  1. Avascular necrosis of bone of hip, left ([facility])     Plan: Patient will require total of arthroplasty left hip.  We discussed importance of him stop drinking particular with his pancreatitis history.  Plan would be overnight stay.  Prescription given for a walker since he is having great difficulty getting to the bathroom.  Patient has whole head involvement by CT scan of his left hip.  Procedure discussed risk surgery discussed questions elicited and answered he understands request to proceed.  Follow-Up Instructions: No follow-ups on file.   Orders:  No orders of the defined types were placed in this encounter.  No orders of the defined types were placed in this encounter.     Procedures: No procedures performed   Clinical Data: No additional findings.   Subjective: Chief Complaint  Patient presents with  . Left Hip - Pain    HPI 53 yo male with visual impairment due to glaucoma since age 6 and severe left hip pain with left hip  CT demonstrated extensive infarct of the femoral head consistent with severe AVN.  Patient has bilateral hip AVN.  Patient's taken some oxycodone which he states dulls the pain.  He tried tramadol before it did not help much.  Past history of alcohol abuse with pancreatitis.  He still drinks currently but less than he used to.  Review of Systems legally blind prosthetic right eye.  History of pancreatitis.  Corneal transplant.  Alcohol abuse.  Bilateral AVN of the hips.  Patient is here with 53 year old who helps with his personal care.  Positive hypertension positive for GERD.  History of UTI.  Previous wrist  fracture.   Objective: Vital Signs: Ht 5\' 7"  (1.702 m)   Wt 156 lb (70.8 kg)   BMI 24.43 kg/m   Physical Exam Constitutional:      Appearance: He is well-developed.  HENT:     Head: Normocephalic and atraumatic.  Eyes:     Pupils: Pupils are equal, round, and reactive to light.  Neck:     Thyroid: No thyromegaly.     Trachea: No tracheal deviation.  Cardiovascular:     Rate and Rhythm: Normal rate.  Pulmonary:     Effort: Pulmonary effort is normal.     Breath sounds: No wheezing.  Abdominal:     General: Bowel sounds are normal.     Palpations: Abdomen is soft.  Skin:    General: Skin is warm and dry.     Capillary Refill: Capillary refill takes less than 2 seconds.  Neurological:     Mental Status: He is alert and oriented to person, place, and time.  Psychiatric:        Behavior: Behavior normal.        Thought Content: Thought content normal.        Judgment: Judgment normal.     Ortho Exam patient has palpable pedal pulses.  He has severe pain with left hip internal rotation 20 degrees external rotation 30 degrees with  severe pain.  Internal and external rotation opposite right hip is nonpainful.  No knee effusion.  No decreased sensation lower extremities.  Knee and ankle jerk are intact.  Specialty Comments:  No specialty comments available.  Imaging: CLINICAL DATA:  Pt states left hip pain for 3 weeks. Feels constant ache but also feels sharp pain that radiate down left leg to toes upon movement. States no previous injury.  EXAM: DG HIP (WITH OR WITHOUT PELVIS) 2-3V LEFT  COMPARISON:  CT abdomen and pelvis, 12/30/2018.  FINDINGS: No acute fracture.  There is symmetric relative lucency in both femoral heads with a surrounding rim of sclerosis. This is stable from the prior CT and is consistent with bilateral avascular necrosis. There is no evidence of subchondral collapse. Hip joints are normally spaced and aligned as are the SI joints and  symphysis pubis.  Soft tissues are unremarkable.  IMPRESSION: 1. Well-defined symmetric areas of lucency in both femoral heads with surrounding sclerosis. 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 No evidence of subchondral collapse. This appearance is stable compared to the prior CT scan. 2. No fracture or acute finding.   Electronically Signed   By: Lajean Manes M.D.   On: 05/19/2019 14:04 CLINICAL DATA:  Left hip pain.  EXAM: CT OF THE LEFT HIP WITHOUT CONTRAST  TECHNIQUE: Multidetector CT imaging of the left hip was performed according to the standard protocol. Multiplanar CT image reconstructions were also generated.  COMPARISON:  Radiographs dated 05/19/2019  FINDINGS: Bones/Joint/Cartilage  Patient has an extensive infarction of the left femoral head without femoral head collapse. Joint spaces preserved. Small joint effusion. The acetabulum appears normal. Visualized pelvic bones are normal.  Muscles and Tendons  Normal.  Soft tissues  Normal.  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 The patient has similar findings of the right femoral head.   Electronically Signed   By: Lorriane Shire M.D.   On: 05/19/2019 15:39  PMFS History: Patient Active Problem List   Diagnosis Date Noted  . Avascular necrosis of bone of hip, left ([facility]) 05/22/2019  . Intractable nausea and vomiting 12/29/2018  . 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 Essential hypertension 04/14/2018  . Blindness of both eyes 04/14/2018  . Alcohol use 04/14/2018  . Hyponatremia 04/14/2018  . RUQ pain   . Abnormal CT of the abdomen   . 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 Retained orthopedic hardware 08/07/2011    Class: Chronic   Past Medical History:  Diagnosis Date  . Alcohol abuse   . Anxiety   . Arthritis   . 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 Elevated LFTs   . Foot and toe(s), blister    bilat  feet, blistering between toes  . GERD (gastroesophageal reflux disease)   . Glaucoma   . Headache(784.0)   . Hypertension    hx of  . Insomnia   . Pancreatitis   . UTI (lower urinary tract infection) 08/04/11   finishing abx tomorrow  . Weight loss    25 lb wt loss since 08/2010  . Wrist fracture, right     Family History  Problem Relation Age of Onset  . Diabetes Sister   . Diabetes Maternal Aunt        aunt and uncles  . Hypertension Sister        aunt and uncles maternal side  . Colon cancer Neg Hx     Past Surgical History:  Procedure Laterality Date  . ESOPHAGOGASTRODUODENOSCOPY    . ESOPHAGOGASTRODUODENOSCOPY (EGD) WITH PROPOFOL N/A 05/09/2017   Procedure: ESOPHAGOGASTRODUODENOSCOPY (EGD) WITH PROPOFOL;  Surgeon: Doran Stabler, MD;  Location: [facility];  Service: Gastroenterology;  Laterality: N/A;  . EYE SURGERY Left   . EYE SURGERY Right    prostetic eye  . foot srugery     removal of bone near small toe  . FRACTURE SURGERY Right    wrist  . HARDWARE REMOVAL  08/06/2011   Procedure: HARDWARE REMOVAL;  Surgeon: Newt Minion, MD;  Location: [facility];  Service: Orthopedics;  Laterality: Right;  Removal Deep Retained Hardware Right Distal Radius  . MULTIPLE TOOTH EXTRACTIONS     for dentures   Social History   Occupational History  . Occupation: disabled  Tobacco Use  . Smoking status: Current Every Day Smoker    Packs/day: 1.00    Types: Cigarettes, Cigars  . Smokeless tobacco: Never Used  . Tobacco comment: 1/2 - 1 cigar/day  Substance and Sexual Activity  . Alcohol use: Not Currently    Alcohol/week: 7.0 standard drinks    Types: 7 Standard drinks or equivalent per week  . Drug use: Yes    Types: Marijuana    Comment: smokes marijuana 1 to 2 times daily  . Sexual activity: Not Currently

## 2019-05-29 ENCOUNTER — Telehealth: Payer: Self-pay | Admitting: Orthopaedic Surgery

## 2019-05-29 NOTE — Telephone Encounter (Signed)
Patient requesting copies of insurance cards be sent to [facility] for cane. Patient stated unable to pick up script form Dr. Lorin Mercy ordered cane. Patient does not have copies of insurance cars and requested to be sent to [facility]. Patient phone number is  [phone]

## 2019-05-30 ENCOUNTER — Telehealth: Payer: Self-pay | Admitting: Orthopaedic Surgery

## 2019-05-30 NOTE — Progress Notes (Signed)
[place] [place], [place] [address] Phone: [phone] Fax: [phone]      Your procedure is scheduled on Wednesday June 06, 2019.  Report to [facility] Main Entrance "A" at 10:30 A.M., and check in at the Admitting office.  Call this number if you have problems the morning of surgery:  [phone]  Call [phone] if you have any questions prior to your surgery date Monday-Friday 8am-4pm    Remember:  Do not eat after midnight the night before your surgery  You may drink clear liquids until 9:30 AM the morning of your surgery.   Clear liquids allowed are: Water, Non-Citrus Juices (without pulp), Carbonated Beverages, Clear Tea, Black Coffee Only, and Gatorade   Please complete your PRE-SURGERY ENSURE that was provided to you by 9:30 AM the morning of surgery.  Please, if able, drink it in one setting. DO NOT SIP.     Take these medicines the morning of surgery with A SIP OF WATER: Oxycodone-acetaminophen (Percocet) if needed Tramadol (Ultram) if needed  7 days prior to surgery STOP taking any Aspirin (unless otherwise instructed by your surgeon), Aleve, Naproxen, Ibuprofen, Motrin, Advil, Goody's, BC's, all herbal medications, fish oil, and all vitamins.    The Morning of Surgery  Do not wear jewelry  Do not wear lotions, powders, colognes, or deodorant  Men may shave face and neck.  Do not bring valuables to the hospital.  [facility] is not responsible for any belongings or valuables.  If you are a smoker, DO NOT Smoke 24 hours prior to surgery  If you wear a CPAP at night please bring your mask, tubing, and machine the morning of surgery   Remember that you must have someone to transport you home after your surgery, and remain with you for 24 hours if you are discharged the same day.   Please bring cases for contacts, glasses, hearing aids, dentures or bridgework because it  cannot be worn into surgery.    Leave your suitcase in the car.  After surgery it may be brought to your room.  For patients admitted to the hospital, discharge time will be determined by your treatment team.  Patients discharged the day of surgery will not be allowed to drive home.    Special instructions:   [facility]- Preparing For Surgery  Before surgery, you can play an important role. Because skin is not sterile, your skin needs to be as free of germs as possible. You can reduce the number of germs on your skin by washing with CHG (chlorahexidine gluconate) Soap before surgery.  CHG is an antiseptic cleaner which kills germs and bonds with the skin to continue killing germs even after washing.    Oral Hygiene is also important to reduce your risk of infection.  Remember - BRUSH YOUR TEETH THE MORNING OF SURGERY WITH YOUR REGULAR TOOTHPASTE  Please do not use if you have an allergy to CHG or antibacterial soaps. If your skin becomes reddened/irritated stop using the CHG.  Do not shave (including legs and underarms) for at least 48 hours prior to first CHG shower. It is OK to shave your face.  Please follow these instructions carefully.   1. Shower the NIGHT BEFORE SURGERY and the MORNING OF SURGERY with CHG Soap.   2. If you chose to wash your hair, wash your hair first as usual with your normal shampoo.  3. After you shampoo,  rinse your hair and body thoroughly to remove the shampoo.  4. Use CHG as you would any other liquid soap. You can apply CHG directly to the skin and wash gently with a scrungie or a clean washcloth.   5. Apply the CHG Soap to your body ONLY FROM THE NECK DOWN.  Do not use on open wounds or open sores. Avoid contact with your eyes, ears, mouth and genitals (private parts). Wash Face and genitals (private parts)  with your normal soap.   6. Wash thoroughly, paying special attention to the area where your surgery will be performed.  7. Thoroughly rinse  your body with warm water from the neck down.  8. DO NOT shower/wash with your normal soap after using and rinsing off the CHG Soap.  9. Pat yourself dry with a CLEAN TOWEL.  10. Wear CLEAN PAJAMAS to bed the night before surgery, wear comfortable clothes the morning of surgery  11. Place CLEAN SHEETS on your bed the night of your first shower and DO NOT SLEEP WITH PETS.    Day of Surgery:  Please shower the morning of surgery with the CHG soap Do not apply any deodorants/lotions. Please wear clean clothes to the hospital/surgery center.   Remember to brush your teeth WITH YOUR REGULAR TOOTHPASTE.   Please read over the following fact sheets that you were given.

## 2019-05-30 NOTE — Telephone Encounter (Signed)
Copy of demographics sent to [facility]. Pt aware

## 2019-05-30 NOTE — Telephone Encounter (Signed)
Patient called in regarding his prescription given to him by dr.yates for a walker, pt states he lost his insurance cards and was unable to receive the walker due to that. Pt is requesting to have him insurance cards sent to [facility].   [phone]

## 2019-05-31 ENCOUNTER — Other Ambulatory Visit: Payer: Self-pay

## 2019-05-31 ENCOUNTER — Encounter (HOSPITAL_COMMUNITY): Payer: Self-pay

## 2019-05-31 ENCOUNTER — Encounter (HOSPITAL_COMMUNITY)
Admission: RE | Admit: 2019-05-31 | Discharge: 2019-05-31 | Disposition: A | Discharge status: Home / Self Care | Payer: Medicare Other | Source: Ambulatory Visit | Attending: Orthopaedic Surgery | Admitting: Orthopaedic Surgery

## 2019-05-31 DIAGNOSIS — F17210 Nicotine dependence, cigarettes, uncomplicated: Secondary | ICD-10-CM | POA: Insufficient documentation

## 2019-05-31 DIAGNOSIS — H547 Unspecified visual loss: Secondary | ICD-10-CM | POA: Insufficient documentation

## 2019-05-31 DIAGNOSIS — M879 Osteonecrosis, unspecified: Secondary | ICD-10-CM | POA: Diagnosis not present

## 2019-05-31 DIAGNOSIS — I10 Essential (primary) hypertension: Secondary | ICD-10-CM | POA: Insufficient documentation

## 2019-05-31 DIAGNOSIS — Z79899 Other long term (current) drug therapy: Secondary | ICD-10-CM | POA: Insufficient documentation

## 2019-05-31 DIAGNOSIS — Z833 Family history of diabetes mellitus: Secondary | ICD-10-CM | POA: Insufficient documentation

## 2019-05-31 DIAGNOSIS — F17290 Nicotine dependence, other tobacco product, uncomplicated: Secondary | ICD-10-CM | POA: Insufficient documentation

## 2019-05-31 DIAGNOSIS — K219 Gastro-esophageal reflux disease without esophagitis: Secondary | ICD-10-CM | POA: Insufficient documentation

## 2019-05-31 DIAGNOSIS — M1612 Unilateral primary osteoarthritis, left hip: Secondary | ICD-10-CM | POA: Diagnosis not present

## 2019-05-31 DIAGNOSIS — Z01812 Encounter for preprocedural laboratory examination: Secondary | ICD-10-CM | POA: Diagnosis not present

## 2019-05-31 LAB — COMPREHENSIVE METABOLIC PANEL
ALT: 15 U/L (ref 0–44)
AST: 21 U/L (ref 15–41)
Albumin: 3.8 g/dL (ref 3.5–5.0)
Alkaline Phosphatase: 111 U/L (ref 38–126)
Anion gap: 11 (ref 5–15)
BUN: 12 mg/dL (ref 6–20)
CO2: 22 mmol/L (ref 22–32)
Calcium: 8.9 mg/dL (ref 8.9–10.3)
Chloride: 103 mmol/L (ref 98–111)
Creatinine, Ser: 1.22 mg/dL (ref 0.61–1.24)
GFR calc Af Amer: >60 mL/min (ref >60)
GFR calc non Af Amer: >60 mL/min (ref >60)
Glucose, Bld: 98 mg/dL (ref 70–99)
Potassium: 4.3 mmol/L (ref 3.5–5.1)
Sodium: 136 mmol/L (ref 135–145)
Total Bilirubin: 0.8 mg/dL (ref 0.3–1.2)
Total Protein: 6.9 g/dL (ref 6.5–8.1)

## 2019-05-31 LAB — CBC
HCT: 43.1 % (ref 39.0–52.0)
Hemoglobin: 14.8 g/dL (ref 13.0–17.0)
MCH: 30.9 pg (ref 26.0–34.0)
MCHC: 34.3 g/dL (ref 30.0–36.0)
MCV: 90 fL (ref 80.0–100.0)
Platelets: 176 10*3/uL (ref 150–400)
RBC: 4.79 MIL/uL (ref 4.22–5.81)
RDW: 13.3 % (ref 11.5–15.5)
WBC: 5.7 10*3/uL (ref 4.0–10.5)
nRBC: 0 % (ref 0.0–0.2)

## 2019-05-31 LAB — SURGICAL PCR SCREEN
MRSA, PCR: NEGATIVE
Staphylococcus aureus: NEGATIVE

## 2019-05-31 NOTE — Progress Notes (Signed)
PCP - [facility] on   [place]    -   Chest x-ray - 8/20 EKG - 8/20 Stress Test - na ECHO - na        ERAS Protcol -yes PRE-SURGERY Ensure or G2- given  COVID TEST- for sat   Anesthesia review: reviewed per op orders with friend also,as pt is blind.both expressed understanding  Patient denies shortness of breath, fever, cough and chest pain at PAT appointment   All instructions explained to the patient, with a verbal understanding of the material. Patient agrees to go over the instructions while at home for a better understanding. Patient also instructed to self quarantine after being tested for COVID-19. The opportunity to ask questions was provided.

## 2019-06-02 ENCOUNTER — Other Ambulatory Visit (HOSPITAL_COMMUNITY)
Admission: RE | Admit: 2019-06-02 | Discharge: 2019-06-02 | Disposition: A | Discharge status: Home / Self Care | Payer: Medicare Other | Source: Ambulatory Visit | Attending: Orthopaedic Surgery | Admitting: Orthopaedic Surgery

## 2019-06-02 DIAGNOSIS — Z01812 Encounter for preprocedural laboratory examination: Secondary | ICD-10-CM | POA: Diagnosis present

## 2019-06-02 DIAGNOSIS — Z20828 Contact with and (suspected) exposure to other viral communicable diseases: Secondary | ICD-10-CM | POA: Diagnosis not present

## 2019-06-03 LAB — NOVEL CORONAVIRUS, NAA (HOSP ORDER, SEND-OUT TO REF LAB; TAT 18-24 HRS): SARS-CoV-2, NAA: NOT DETECTED

## 2019-06-06 ENCOUNTER — Encounter (HOSPITAL_COMMUNITY)
Admission: RE | Disposition: A | Discharge status: Home Health | Payer: Self-pay | Source: Home / Self Care | Attending: Orthopaedic Surgery

## 2019-06-06 ENCOUNTER — Other Ambulatory Visit: Payer: Self-pay

## 2019-06-06 ENCOUNTER — Ambulatory Visit (HOSPITAL_COMMUNITY): Payer: Medicare Other | Admitting: Physician Assistant

## 2019-06-06 ENCOUNTER — Observation Stay (HOSPITAL_COMMUNITY)
Admission: RE | Admit: 2019-06-06 | Discharge: 2019-06-07 | Disposition: A | Discharge status: Home Health | Payer: Medicare Other | Attending: Orthopaedic Surgery | Admitting: Orthopaedic Surgery

## 2019-06-06 ENCOUNTER — Ambulatory Visit (HOSPITAL_COMMUNITY): Payer: Medicare Other

## 2019-06-06 ENCOUNTER — Ambulatory Visit (HOSPITAL_COMMUNITY): Payer: Medicare Other | Admitting: Anesthesiology

## 2019-06-06 ENCOUNTER — Encounter (HOSPITAL_COMMUNITY): Payer: Self-pay | Admitting: Orthopaedic Surgery

## 2019-06-06 ENCOUNTER — Observation Stay (HOSPITAL_COMMUNITY): Payer: Medicare Other

## 2019-06-06 DIAGNOSIS — M87852 Other osteonecrosis, left femur: Secondary | ICD-10-CM | POA: Diagnosis not present

## 2019-06-06 DIAGNOSIS — F419 Anxiety disorder, unspecified: Secondary | ICD-10-CM | POA: Diagnosis not present

## 2019-06-06 DIAGNOSIS — Z947 Corneal transplant status: Secondary | ICD-10-CM | POA: Insufficient documentation

## 2019-06-06 DIAGNOSIS — F17290 Nicotine dependence, other tobacco product, uncomplicated: Secondary | ICD-10-CM | POA: Diagnosis not present

## 2019-06-06 DIAGNOSIS — I10 Essential (primary) hypertension: Secondary | ICD-10-CM | POA: Insufficient documentation

## 2019-06-06 DIAGNOSIS — F17210 Nicotine dependence, cigarettes, uncomplicated: Secondary | ICD-10-CM | POA: Insufficient documentation

## 2019-06-06 DIAGNOSIS — Z791 Long term (current) use of non-steroidal anti-inflammatories (NSAID): Secondary | ICD-10-CM | POA: Insufficient documentation

## 2019-06-06 DIAGNOSIS — Q150 Congenital glaucoma: Secondary | ICD-10-CM | POA: Diagnosis not present

## 2019-06-06 DIAGNOSIS — M87052 Idiopathic aseptic necrosis of left femur: Secondary | ICD-10-CM | POA: Diagnosis not present

## 2019-06-06 DIAGNOSIS — Z79899 Other long term (current) drug therapy: Secondary | ICD-10-CM | POA: Diagnosis not present

## 2019-06-06 DIAGNOSIS — Z09 Encounter for follow-up examination after completed treatment for conditions other than malignant neoplasm: Secondary | ICD-10-CM

## 2019-06-06 DIAGNOSIS — H548 Legal blindness, as defined in USA: Secondary | ICD-10-CM | POA: Diagnosis not present

## 2019-06-06 DIAGNOSIS — K219 Gastro-esophageal reflux disease without esophagitis: Secondary | ICD-10-CM | POA: Diagnosis not present

## 2019-06-06 DIAGNOSIS — M1612 Unilateral primary osteoarthritis, left hip: Secondary | ICD-10-CM | POA: Diagnosis present

## 2019-06-06 DIAGNOSIS — Z419 Encounter for procedure for purposes other than remedying health state, unspecified: Secondary | ICD-10-CM

## 2019-06-06 HISTORY — PX: TOTAL HIP ARTHROPLASTY: SHX124

## 2019-06-06 LAB — URINALYSIS, ROUTINE W REFLEX MICROSCOPIC
Bacteria, UA: NONE SEEN
Bilirubin Urine: NEGATIVE
Glucose, UA: NEGATIVE mg/dL
Ketones, ur: NEGATIVE mg/dL
Leukocytes,Ua: NEGATIVE
Nitrite: NEGATIVE
Protein, ur: NEGATIVE mg/dL
Specific Gravity, Urine: 1.004 — ABNORMAL LOW (ref 1.005–1.030)
pH: 7 (ref 5.0–8.0)

## 2019-06-06 SURGERY — ARTHROPLASTY, HIP, TOTAL, ANTERIOR APPROACH
Anesthesia: Spinal | Site: Hip | Laterality: Left

## 2019-06-06 MED ORDER — HYDROMORPHONE HCL 1 MG/ML IJ SOLN
0.2500 mg | INTRAMUSCULAR | Status: DC | PRN
  Administered 2019-06-06 (×2): 0.5 mg via INTRAVENOUS

## 2019-06-06 MED ORDER — LACTATED RINGERS IV SOLN: INTRAVENOUS | Status: DC

## 2019-06-06 MED ORDER — METHOCARBAMOL 1000 MG/10ML IJ SOLN
500.0000 mg | Freq: Four times a day (QID) | INTRAVENOUS | Status: DC | PRN
  Filled 2019-06-06: qty 5

## 2019-06-06 MED ORDER — OXYCODONE HCL 5 MG PO TABS
5.0000 mg | ORAL_TABLET | ORAL | Status: DC | PRN
  Administered 2019-06-06 – 2019-06-07 (×4): 10 mg via ORAL
  Filled 2019-06-06 (×4): qty 2

## 2019-06-06 MED ORDER — PROPOFOL 500 MG/50ML IV EMUL
INTRAVENOUS | Status: DC | PRN
  Administered 2019-06-06: 65 ug/kg/min via INTRAVENOUS
  Administered 2019-06-06: 50 ug/kg/min via INTRAVENOUS

## 2019-06-06 MED ORDER — MIDAZOLAM HCL 2 MG/2ML IJ SOLN: 0.5000 mg | Freq: Once | INTRAMUSCULAR | Status: DC | PRN

## 2019-06-06 MED ORDER — BUPIVACAINE HCL (PF) 0.25 % IJ SOLN
INTRAMUSCULAR | Status: DC | PRN
  Administered 2019-06-06: 10 mL

## 2019-06-06 MED ORDER — BUPIVACAINE LIPOSOME 1.3 % IJ SUSP
20.0000 mL | Freq: Once | INTRAMUSCULAR | Status: DC
  Filled 2019-06-06: qty 20

## 2019-06-06 MED ORDER — METHOCARBAMOL 500 MG PO TABS
500.0000 mg | ORAL_TABLET | Freq: Four times a day (QID) | ORAL | Status: DC | PRN
  Administered 2019-06-06 – 2019-06-07 (×3): 500 mg via ORAL
  Filled 2019-06-06 (×4): qty 1

## 2019-06-06 MED ORDER — TRANEXAMIC ACID-NACL 1000-0.7 MG/100ML-% IV SOLN
INTRAVENOUS | Status: DC | PRN
  Administered 2019-06-06: 1000 mg via INTRAVENOUS

## 2019-06-06 MED ORDER — LIDOCAINE 2% (20 MG/ML) 5 ML SYRINGE
INTRAMUSCULAR | Status: DC | PRN
  Administered 2019-06-06 (×4): 25 mg via INTRAVENOUS

## 2019-06-06 MED ORDER — FENTANYL CITRATE (PF) 100 MCG/2ML IJ SOLN
INTRAMUSCULAR | Status: DC | PRN
  Administered 2019-06-06: 50 ug via INTRAVENOUS
  Administered 2019-06-06: 25 ug via INTRAVENOUS
  Administered 2019-06-06: 50 ug via INTRAVENOUS
  Administered 2019-06-06: 25 ug via INTRAVENOUS
  Administered 2019-06-06: 50 ug via INTRAVENOUS

## 2019-06-06 MED ORDER — MIDAZOLAM HCL 2 MG/2ML IJ SOLN
INTRAMUSCULAR | Status: AC
  Filled 2019-06-06: qty 2

## 2019-06-06 MED ORDER — METOCLOPRAMIDE HCL 5 MG/ML IJ SOLN: 5.0000 mg | Freq: Three times a day (TID) | INTRAMUSCULAR | Status: DC | PRN

## 2019-06-06 MED ORDER — ASPIRIN EC 325 MG PO TBEC
325.0000 mg | DELAYED_RELEASE_TABLET | Freq: Every day | ORAL | Status: DC
  Administered 2019-06-07: 325 mg via ORAL
  Filled 2019-06-06: qty 1

## 2019-06-06 MED ORDER — FENTANYL CITRATE (PF) 250 MCG/5ML IJ SOLN
INTRAMUSCULAR | Status: AC
  Filled 2019-06-06: qty 5

## 2019-06-06 MED ORDER — MENTHOL 3 MG MT LOZG: 1.0000 | LOZENGE | OROMUCOSAL | Status: DC | PRN

## 2019-06-06 MED ORDER — SODIUM CHLORIDE 0.9 % IV SOLN: INTRAVENOUS | Status: DC

## 2019-06-06 MED ORDER — MIDAZOLAM HCL 5 MG/5ML IJ SOLN
INTRAMUSCULAR | Status: DC | PRN
  Administered 2019-06-06 (×2): 1 mg via INTRAVENOUS

## 2019-06-06 MED ORDER — BUPIVACAINE LIPOSOME 1.3 % IJ SUSP
INTRAMUSCULAR | Status: DC | PRN
  Administered 2019-06-06: 10 mL

## 2019-06-06 MED ORDER — ACETAMINOPHEN 325 MG PO TABS
325.0000 mg | ORAL_TABLET | Freq: Four times a day (QID) | ORAL | Status: DC | PRN
  Administered 2019-06-06 – 2019-06-07 (×2): 650 mg via ORAL
  Filled 2019-06-06 (×2): qty 2

## 2019-06-06 MED ORDER — ONDANSETRON HCL 4 MG PO TABS
4.0000 mg | ORAL_TABLET | Freq: Four times a day (QID) | ORAL | Status: DC | PRN
  Administered 2019-06-07: 11:00:00 4 mg via ORAL
  Filled 2019-06-06: qty 1

## 2019-06-06 MED ORDER — HYDROMORPHONE HCL 1 MG/ML IJ SOLN
INTRAMUSCULAR | Status: AC
  Filled 2019-06-06: qty 1

## 2019-06-06 MED ORDER — CEFAZOLIN SODIUM-DEXTROSE 2-4 GM/100ML-% IV SOLN
2.0000 g | INTRAVENOUS | Status: AC
  Administered 2019-06-06: 2 g via INTRAVENOUS
  Filled 2019-06-06: qty 100

## 2019-06-06 MED ORDER — PHENYLEPHRINE HCL-NACL 10-0.9 MG/250ML-% IV SOLN
INTRAVENOUS | Status: DC | PRN
  Administered 2019-06-06: 25 ug/min via INTRAVENOUS

## 2019-06-06 MED ORDER — PHENOL 1.4 % MT LIQD: 1.0000 | OROMUCOSAL | Status: DC | PRN

## 2019-06-06 MED ORDER — MEPERIDINE HCL 25 MG/ML IJ SOLN: 6.2500 mg | INTRAMUSCULAR | Status: DC | PRN

## 2019-06-06 MED ORDER — POLYETHYLENE GLYCOL 3350 17 G PO PACK: 17.0000 g | PACK | Freq: Every day | ORAL | Status: DC | PRN

## 2019-06-06 MED ORDER — DOCUSATE SODIUM 100 MG PO CAPS
100.0000 mg | ORAL_CAPSULE | Freq: Two times a day (BID) | ORAL | Status: DC
  Administered 2019-06-06: 100 mg via ORAL
  Filled 2019-06-06 (×2): qty 1

## 2019-06-06 MED ORDER — BUPIVACAINE HCL (PF) 0.25 % IJ SOLN
INTRAMUSCULAR | Status: AC
  Filled 2019-06-06: qty 10

## 2019-06-06 MED ORDER — HYDROMORPHONE HCL 1 MG/ML IJ SOLN
0.5000 mg | INTRAMUSCULAR | Status: DC | PRN
  Administered 2019-06-06 – 2019-06-07 (×2): 0.5 mg via INTRAVENOUS
  Filled 2019-06-06 (×2): qty 1

## 2019-06-06 MED ORDER — METOCLOPRAMIDE HCL 5 MG PO TABS: 5.0000 mg | ORAL_TABLET | Freq: Three times a day (TID) | ORAL | Status: DC | PRN

## 2019-06-06 MED ORDER — CHLORHEXIDINE GLUCONATE 4 % EX LIQD: 60.0000 mL | Freq: Once | CUTANEOUS | Status: DC

## 2019-06-06 MED ORDER — ONDANSETRON HCL 4 MG/2ML IJ SOLN: 4.0000 mg | Freq: Four times a day (QID) | INTRAMUSCULAR | Status: DC | PRN

## 2019-06-06 MED ORDER — TRANEXAMIC ACID-NACL 1000-0.7 MG/100ML-% IV SOLN
INTRAVENOUS | Status: AC
  Filled 2019-06-06: qty 100

## 2019-06-06 MED ORDER — PROMETHAZINE HCL 25 MG/ML IJ SOLN: 6.2500 mg | INTRAMUSCULAR | Status: DC | PRN

## 2019-06-06 MED ORDER — ONDANSETRON HCL 4 MG/2ML IJ SOLN
INTRAMUSCULAR | Status: DC | PRN
  Administered 2019-06-06: 4 mg via INTRAVENOUS

## 2019-06-06 SURGICAL SUPPLY — 58 items
APL SKNCLS STERI-STRIP NONHPOA (GAUZE/BANDAGES/DRESSINGS) ×1
BENZOIN TINCTURE PRP APPL 2/3 (GAUZE/BANDAGES/DRESSINGS) ×3 IMPLANT
BLADE CLIPPER SURG (BLADE) IMPLANT
BLADE SAW SGTL 18X1.27X75 (BLADE) ×2 IMPLANT
BLADE SAW SGTL 18X1.27X75MM (BLADE) ×1
CELLS DAT CNTRL 66122 CELL SVR (MISCELLANEOUS) ×1 IMPLANT
CLOSURE WOUND 1/2 X4 (GAUZE/BANDAGES/DRESSINGS) ×1
COVER SURGICAL LIGHT HANDLE (MISCELLANEOUS) ×3 IMPLANT
COVER WAND RF STERILE (DRAPES) ×3 IMPLANT
DRAPE C-ARM 42X72 X-RAY (DRAPES) ×3 IMPLANT
DRAPE IMP U-DRAPE 54X76 (DRAPES) ×3 IMPLANT
DRAPE STERI IOBAN 125X83 (DRAPES) ×3 IMPLANT
DRAPE U-SHAPE 47X51 STRL (DRAPES) ×9 IMPLANT
DRSG MEPILEX BORDER 4X8 (GAUZE/BANDAGES/DRESSINGS) ×3 IMPLANT
DURAPREP 26ML APPLICATOR (WOUND CARE) ×3 IMPLANT
ELECT BLADE 4.0 EZ CLEAN MEGAD (MISCELLANEOUS)
ELECT CAUTERY BLADE 6.4 (BLADE) ×3 IMPLANT
ELECT REM PT RETURN 9FT ADLT (ELECTROSURGICAL) ×3
ELECTRODE BLDE 4.0 EZ CLN MEGD (MISCELLANEOUS) IMPLANT
ELECTRODE REM PT RTRN 9FT ADLT (ELECTROSURGICAL) ×1 IMPLANT
ELIMINATOR HOLE APEX DEPUY (Hips) ×3 IMPLANT
FACESHIELD WRAPAROUND (MASK) ×6 IMPLANT
GLOVE BIOGEL PI IND STRL 8 (GLOVE) ×2 IMPLANT
GLOVE BIOGEL PI INDICATOR 8 (GLOVE) ×4
GLOVE ORTHO TXT STRL SZ7.5 (GLOVE) ×6 IMPLANT
GOWN STRL REUS W/ TWL LRG LVL3 (GOWN DISPOSABLE) ×1 IMPLANT
GOWN STRL REUS W/ TWL XL LVL3 (GOWN DISPOSABLE) ×1 IMPLANT
GOWN STRL REUS W/TWL 2XL LVL3 (GOWN DISPOSABLE) ×3 IMPLANT
GOWN STRL REUS W/TWL LRG LVL3 (GOWN DISPOSABLE) ×3
GOWN STRL REUS W/TWL XL LVL3 (GOWN DISPOSABLE) ×3
HEAD FEMORAL 32 CERAMIC (Hips) ×3 IMPLANT
KIT BASIN OR (CUSTOM PROCEDURE TRAY) ×3 IMPLANT
KIT TURNOVER KIT B (KITS) ×3 IMPLANT
LINER ACET PNNCL PLUS4 NEUTRAL (Hips) ×1 IMPLANT
LINER PINN ACET GRIP 50X100 ×3 IMPLANT
MANIFOLD NEPTUNE II (INSTRUMENTS) ×3 IMPLANT
NS IRRIG 1000ML POUR BTL (IV SOLUTION) ×3 IMPLANT
PACK TOTAL JOINT (CUSTOM PROCEDURE TRAY) ×3 IMPLANT
PAD ARMBOARD 7.5X6 YLW CONV (MISCELLANEOUS) ×6 IMPLANT
PINNACLE PLUS 4 NEUTRAL (Hips) ×3 IMPLANT
RTRCTR WOUND ALEXIS 18CM MED (MISCELLANEOUS) ×3
STEM FEM SZ3 STD ACTIS (Stem) ×3 IMPLANT
STRIP CLOSURE SKIN 1/2X4 (GAUZE/BANDAGES/DRESSINGS) ×2 IMPLANT
SUT DVC VLOC 180 2-0 12IN GS21 (SUTURE) ×3
SUT ENDO VLOC 180-0-8IN (SUTURE) ×3 IMPLANT
SUT VIC AB 0 CT1 27 (SUTURE) ×6
SUT VIC AB 0 CT1 27XBRD ANBCTR (SUTURE) ×2 IMPLANT
SUT VIC AB 2-0 CT1 27 (SUTURE) ×6
SUT VIC AB 2-0 CT1 TAPERPNT 27 (SUTURE) ×2 IMPLANT
SUT VIC AB 4-0 PS2 18 (SUTURE) ×3 IMPLANT
SUT VICRYL 4-0 PS2 18IN ABS (SUTURE) ×3 IMPLANT
SUT VLOC 180 0 24IN GS25 (SUTURE) ×3 IMPLANT
SUTURE DVC VL 180 2-0 12INGS21 (SUTURE) ×1 IMPLANT
TOWEL GREEN STERILE (TOWEL DISPOSABLE) ×6 IMPLANT
TOWEL GREEN STERILE FF (TOWEL DISPOSABLE) ×3 IMPLANT
TRAY CATH 16FR W/PLASTIC CATH (SET/KITS/TRAYS/PACK) IMPLANT
TRAY FOLEY MTR SLVR 16FR STAT (SET/KITS/TRAYS/PACK) IMPLANT
WATER STERILE IRR 1000ML POUR (IV SOLUTION) ×6 IMPLANT

## 2019-06-06 NOTE — Op Note (Addendum)
Preop diagnosis: Left hip AVN with femoral head collapse.  Postop diagnosis: Same  Procedure: Left total hip arthroplasty direct anterior approach  Surgeon: Rodell Perna, MD  Assistant: Benjiman Morello, PA-C  Anesthesia spinal +10 cc Marcaine and 10 cc Exparel.  Implants:Depuy gripton 50 mm cup series 100 no lipped polyliner +4.  Apex illuminator.  Size 3 Actis press-fit femoral stem.  83mm +1.5 mm ceramic ball.  Procedure: After induction of spinal anesthesia preoperative Ancef prophylaxis TXA Hana boots were applied patient placed on the Hana table careful padding and positioning.  C-arm was brought in good visualization of both hips 1015 drapes were applied above or below.  Patient was then did not require any taping of the abdomen.  DuraPrep the usual split sheets drapes large sharp curtain Betadine Steri-Drape was applied.  Pole was cut and the hydraulic arm was attached and sealed with a piece of Steri-Drape.  To my procedure was completed.  Direct anterior approach was made starting lateral and just inferior to the ASIS obliquely to the trochanter fascia was immediately identified elevated dull cobra placed over the top the capsule capsule was open there was immediate support of joint fluid which was tight.  No purulence.  Capsule was thickened opened and neck was cut with C-arm visualization initially slightly long and then recut so it was 11 mm neck length.  Head was attempted be removed with a corkscrew and the problem was the head had collapsed and there was a mobile fragment which kept catching and could not be removed so we had to cut the head and the pieces to remove it.  Sequential reaming to 45 and then placement of a 50 cup secured central hole filled with apex hole illuminator and then a +4 neutral liner without lip was inserted polyethylene.  It was secured checked was not loose and the cup was secure and on x-ray fully seated in good flexion and abduction.  Hydraulic arm was applied leg  was taken and external rotation 120 down and under and then sequential preparation.  Canal was tight and we only could progress to a size 3 stem which gave complete fill of the canal.  Leg lengths were equal with the +1.5 and the neck length.  Femoral stem was inserted.  There is good stability in extension 45 degrees 90 degrees external rotation there was no shock leg lengths are equal and permanent stem and ceramic ball was inserted impacted identical findings to stability.  Marcaine and Exparel was injected.  V lock closure of the deep fascia 2 and subtenons tissue skin staple closure postop dressing and transferred to care room.  Patient is legally blind lives alone.  Pain is gotten so severe he could not ambulate due to the mobile fragment and had collapse.  Plan overnight stay and then he should be able to go home with family/friends

## 2019-06-06 NOTE — H&P (Signed)
TOTAL HIP ADMISSION H&P  Patient is admitted for left total hip arthroplasty.  Subjective:  Chief Complaint: left hip pain  HPI: Austin [name], 53 y.o. male, has a history of pain and functional disability in the left hip(s) due to arthritis and patient has failed non-surgical conservative treatments for greater than 12 weeks to include NSAID's and/or analgesics, use of assistive devices and activity modification.  Onset of symptoms was gradual starting >10 years ago with gradually worsening course since that time.The patient noted no past surgery on the left hip(s).  Patient currently rates pain in the left hip at 10 out of 10 with activity. Patient has night pain, worsening of pain with activity and weight bearing, trendelenberg gait, pain that interfers with activities of daily living and pain with passive range of motion. Patient has evidence of subchondral cysts, subchondral sclerosis, periarticular osteophytes and joint space narrowing by imaging studies. This condition presents safety issues increasing the risk of falls.   There is no current active infection.  Patient Active Problem List   Diagnosis Date Noted  . Avascular necrosis of bone of hip, left ([place]) 05/22/2019  . Intractable nausea and vomiting 12/29/2018  . 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 Essential hypertension 04/14/2018  . Blindness of both eyes 04/14/2018  . Alcohol use 04/14/2018  . Hyponatremia 04/14/2018  . RUQ pain   . Abnormal CT of the abdomen   . 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 Retained orthopedic hardware 08/07/2011   Past Medical History:  Diagnosis Date  . Alcohol abuse   . Anxiety   . Arthritis   . 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 Elevated LFTs   . Foot and toe(s), blister    bilat feet, blistering between toes  . GERD (gastroesophageal reflux disease)   . Glaucoma   . Glaucoma as birth trauma   . Headache(784.0)   . Hypertension    hx of  . Insomnia   .  Pancreatitis   . UTI (lower urinary tract infection) 08/04/11   finishing abx tomorrow  . Weight loss    25 lb wt loss since 08/2010  . Wrist fracture, right     Past Surgical History:  Procedure Laterality Date  . ESOPHAGOGASTRODUODENOSCOPY    . ESOPHAGOGASTRODUODENOSCOPY (EGD) WITH PROPOFOL N/A 05/09/2017   Procedure: ESOPHAGOGASTRODUODENOSCOPY (EGD) WITH PROPOFOL;  Surgeon: Doran Stabler, MD;  Location: [facility];  Service: Gastroenterology;  Laterality: N/A;  . EYE SURGERY Left   . EYE SURGERY Right    prostetic eye  . foot srugery     removal of bone near small toe  . FRACTURE SURGERY Right    wrist  . HARDWARE REMOVAL  08/06/2011   Procedure: HARDWARE REMOVAL;  Surgeon: Newt Minion, MD;  Location: [facility];  Service: Orthopedics;  Laterality: Right;  Removal Deep Retained Hardware Right Distal Radius  . MULTIPLE TOOTH EXTRACTIONS     for dentures    Current Facility-Administered Medications  Medication Dose Route Frequency Provider Last Rate Last Admin  . ceFAZolin (ANCEF) IVPB 2g/100 mL premix  2 g Intravenous On Call to OR Lanae Crumbly, PA-C      . chlorhexidine (HIBICLENS) 4 % liquid 4 application  60 mL Topical Once Benjiman Eroh M, PA-C      . lactated ringers infusion   Intravenous Continuous Annye Asa, MD 10 mL/hr at 06/06/19 1030 New Bag at 06/06/19 1030   No Known Allergies  Social History   Tobacco Use  . Smoking status: Current Every Day Smoker    Packs/day: 1.00    Years: 17.00    Pack years: 17.00    Types: Cigarettes, Cigars  . Smokeless tobacco: Never Used  . Tobacco comment: 1/2 - 1 cigar/day  Substance Use Topics  . Alcohol use: Not Currently    Alcohol/week: 7.0 standard drinks    Types: 7 Standard drinks or equivalent per week    Family History  Problem Relation Age of Onset  . Diabetes Sister   . Diabetes Maternal Aunt        aunt and uncles  . Hypertension Sister        aunt and uncles maternal side  . Colon cancer Neg Hx       Review of Systems  Constitutional: Positive for activity change.  HENT: Negative.   Respiratory: Negative.   Cardiovascular: Negative.   Gastrointestinal: Negative.   Musculoskeletal: Positive for gait problem and joint swelling.    Objective:  Physical Exam  Vital signs in last 24 hours: Temp:  [99 F (37.2 C)] 99 F (37.2 C) (12/30 1003) Pulse Rate:  [65] 65 (12/30 1003) Resp:  [20] 20 (12/30 1003) BP: (159)/(92) 159/92 (12/30 1003) SpO2:  [100 %] 100 % (12/30 1003) Weight:  [68.5 kg] 68.5 kg (12/30 1003)  Labs:   Estimated body mass index is 23.65 kg/m as calculated from the following:   Height as of this encounter: 5\' 7"  (1.702 m).   Weight as of this encounter: 68.5 kg.   Imaging Review Plain radiographs demonstrate severe degenerative joint disease of the left hip(s). The bone quality appears to be good for age and reported activity level.      Assessment/Plan:  End stage arthritis/AVN, left hip(s)  The patient history, physical examination, clinical judgement of the provider and imaging studies are consistent with end stage degenerative joint disease of the left hip(s) and total hip arthroplasty is deemed medically necessary. The treatment options including medical management, injection therapy, arthroscopy and arthroplasty were discussed at length. The risks and benefits of total hip arthroplasty were presented and reviewed. The risks due to aseptic loosening, infection, stiffness, dislocation/subluxation,  thromboembolic complications and other imponderables were discussed.  The patient acknowledged the explanation, agreed to proceed with the plan and consent was signed. Patient is being admitted for inpatient treatment for surgery, pain control, PT, OT, prophylactic antibiotics, VTE prophylaxis, progressive ambulation and ADL's and discharge planning.The patient is planning to be discharged home with home health services

## 2019-06-06 NOTE — Anesthesia Procedure Notes (Signed)
Spinal  Patient location during procedure: OR End time: 06/06/2019 1:00 PM Staffing Performed: anesthesiologist  Anesthesiologist: Annye Asa, MD Preanesthetic Checklist Completed: patient identified, IV checked, site marked, risks and benefits discussed, surgical consent, monitors and equipment checked, pre-op evaluation and timeout performed Spinal Block Patient position: sitting Prep: DuraPrep and site prepped and draped Patient monitoring: blood pressure, continuous pulse ox, cardiac monitor and heart rate Approach: midline Location: L3-4 Injection technique: single-shot Needle Needle type: Pencan  Needle gauge: 24 G Needle length: 9 cm Additional Notes Pt identified in Operating room.  Monitors applied. Working IV access confirmed. Sterile prep, drape lumbar spine.  1% lido local L 3,4.  #24ga Pencan into clear CSF L 3,4.  15mg  0.75% Bupivacaine with dextrose injected with asp CSF beginning and end of injection.  Patient asymptomatic, VSS, no heme aspirated, tolerated well.  Jenita Seashore, MD

## 2019-06-06 NOTE — Transfer of Care (Signed)
Immediate Anesthesia Transfer of Care Note  Patient: Austin [name]  Procedure(s) Performed: LEFT TOTAL HIP ARTHROPLASTY-DIRECT ANTERIOR (Left Hip)  Patient Location: PACU  Anesthesia Type:Spinal  Level of Consciousness: awake, alert  and oriented  Airway & Oxygen Therapy: Patient Spontanous Breathing and Patient connected to face mask oxygen  Post-op Assessment: Report given to RN and Post -op Vital signs reviewed and stable  Post vital signs: Reviewed and stable  Last Vitals:  Vitals Value Taken Time  BP 112/70 06/06/19 1453  Temp    Pulse 53 06/06/19 1455  Resp 28 06/06/19 1455  SpO2 100 % 06/06/19 1455  Vitals shown include unvalidated device data.  Last Pain:  Vitals:   06/06/19 1020  TempSrc:   PainSc: 7       Patients Stated Pain Goal: 3 ([id number])  Complications: No apparent anesthesia complications

## 2019-06-06 NOTE — Anesthesia Preprocedure Evaluation (Signed)
Anesthesia Evaluation  Patient identified by MRN, date of birth, ID band Patient awake    Reviewed: Allergy & Precautions, NPO status , Patient's Chart, lab work & pertinent test results  History of Anesthesia Complications Negative for: history of anesthetic complications  Airway Mallampati: I  TM Distance: >3 FB Neck ROM: Full    Dental  (+) Edentulous Upper, Edentulous Lower   Pulmonary Current SmokerPatient did not abstain from smoking.,  06/02/2019 SARS CoV2 NEG   breath sounds clear to auscultation       Cardiovascular hypertension (does not take meds ), (-) angina Rhythm:Regular Rate:Normal     Neuro/Psych Anxiety negative neurological ROS     GI/Hepatic Neg liver ROS, GERD  Medicated and Controlled,  Endo/Other  negative endocrine ROS  Renal/GU negative Renal ROS     Musculoskeletal  (+) Arthritis , Osteoarthritis,    Abdominal   Peds  Hematology negative hematology ROS (+)   Anesthesia Other Findings   Reproductive/Obstetrics                             Anesthesia Physical Anesthesia Plan  ASA: II  Anesthesia Plan: Spinal   Post-op Pain Management:    Induction:   PONV Risk Score and Plan: 0 and Ondansetron and Treatment may vary due to age or medical condition  Airway Management Planned: Natural Airway and Simple Face Mask  Additional Equipment:   Intra-op Plan:   Post-operative Plan:   Informed Consent: I have reviewed the patients History and Physical, chart, labs and discussed the procedure including the risks, benefits and alternatives for the proposed anesthesia with the patient or authorized representative who has indicated his/her understanding and acceptance.       Plan Discussed with: CRNA and Surgeon  Anesthesia Plan Comments:         Anesthesia Quick Evaluation

## 2019-06-06 NOTE — Anesthesia Postprocedure Evaluation (Signed)
Anesthesia Post Note  Patient: Austin [name]  Procedure(s) Performed: LEFT TOTAL HIP ARTHROPLASTY-DIRECT ANTERIOR (Left Hip)     Patient location during evaluation: PACU Anesthesia Type: Spinal Level of consciousness: awake and alert, patient cooperative and oriented Pain management: pain level controlled Vital Signs Assessment: post-procedure vital signs reviewed and stable Respiratory status: spontaneous breathing, nonlabored ventilation and respiratory function stable Cardiovascular status: blood pressure returned to baseline and stable Postop Assessment: spinal receding, patient able to bend at knees and no apparent nausea or vomiting Anesthetic complications: no    Last Vitals:  Vitals:   06/06/19 1638 06/06/19 1653  BP: 130/76 136/81  Pulse: (!) 54 (!) 53  Resp: 13 14  Temp:    SpO2: 99% 100%    Last Pain:  Vitals:   06/06/19 1708  TempSrc:   PainSc: 0-No pain                 Maurie Musco,E. Adelei Scobey

## 2019-06-06 NOTE — Interval H&P Note (Signed)
History and Physical Interval Note:  06/06/2019 12:30 PM  Austin [name]  has presented today for surgery, with the diagnosis of left hip avascular necrosis.  The various methods of treatment have been discussed with the patient and family. After consideration of risks, benefits and other options for treatment, the patient has consented to  Procedure(s): LEFT TOTAL HIP ARTHROPLASTY-DIRECT ANTERIOR (Left) as a surgical intervention.  The patient's history has been reviewed, patient examined, no change in status, stable for surgery.  I have reviewed the patient's chart and labs.  Questions were answered to the patient's satisfaction.     [name]

## 2019-06-07 ENCOUNTER — Encounter: Payer: Self-pay | Admitting: *Deleted

## 2019-06-07 DIAGNOSIS — M87852 Other osteonecrosis, left femur: Secondary | ICD-10-CM | POA: Diagnosis not present

## 2019-06-07 LAB — CBC
HCT: 38.1 % — ABNORMAL LOW (ref 39.0–52.0)
Hemoglobin: 13 g/dL (ref 13.0–17.0)
MCH: 30.5 pg (ref 26.0–34.0)
MCHC: 34.1 g/dL (ref 30.0–36.0)
MCV: 89.4 fL (ref 80.0–100.0)
Platelets: 143 10*3/uL — ABNORMAL LOW (ref 150–400)
RBC: 4.26 MIL/uL (ref 4.22–5.81)
RDW: 13.2 % (ref 11.5–15.5)
WBC: 9 10*3/uL (ref 4.0–10.5)
nRBC: 0 % (ref 0.0–0.2)

## 2019-06-07 LAB — BASIC METABOLIC PANEL
Anion gap: 9 (ref 5–15)
BUN: 7 mg/dL (ref 6–20)
CO2: 24 mmol/L (ref 22–32)
Calcium: 8.2 mg/dL — ABNORMAL LOW (ref 8.9–10.3)
Chloride: 98 mmol/L (ref 98–111)
Creatinine, Ser: 0.98 mg/dL (ref 0.61–1.24)
GFR calc Af Amer: >60 mL/min (ref >60)
GFR calc non Af Amer: >60 mL/min (ref >60)
Glucose, Bld: 140 mg/dL — ABNORMAL HIGH (ref 70–99)
Potassium: 4.2 mmol/L (ref 3.5–5.1)
Sodium: 131 mmol/L — ABNORMAL LOW (ref 135–145)

## 2019-06-07 MED ORDER — ASPIRIN 325 MG PO TABS: 325.0000 mg | ORAL_TABLET | Freq: Every day | ORAL | Status: DC

## 2019-06-07 MED ORDER — OXYCODONE-ACETAMINOPHEN 5-325 MG PO TABS: 1.0000 | ORAL_TABLET | ORAL | 0 refills | Status: DC | PRN

## 2019-06-07 MED ORDER — METHOCARBAMOL 500 MG PO TABS: 500.0000 mg | ORAL_TABLET | Freq: Four times a day (QID) | ORAL | Status: DC | PRN

## 2019-06-07 NOTE — Progress Notes (Signed)
Physical Therapy Treatment Patient Details Name: Austin [name] MRN: [id number] DOB: [birth year] Today's Date: 06/07/2019    History of Present Illness 53 yo admitted for Left THA direct anterior due to AVN. PMhx: glaucoma with bil blindness, prosthetic Rt eye, corneal transplant, alcoholic pancreatitis, HTN    PT Comments    Pt pleasant and continues to report pain in all joints of LLE limiting mobility. Pt moving slowly and hesitantly due to pain. Educated for HEP, car transfers, ambulation and progression with brother present and HEP handout provided. Brother educated for assist with bed mobility, ADLs particularly clothing management and assisting with home therapy until HHPT arranged. Will continue to follow.     Follow Up Recommendations  Home health PT     Equipment Recommendations  Rolling walker with 5" wheels;3in1 (PT)    Recommendations for Other Services OT consult     Precautions / Restrictions Precautions Precautions: Fall Precaution Comments: blind Restrictions LLE Weight Bearing: Weight bearing as tolerated    Mobility  Bed Mobility Overal bed mobility: Needs Assistance Bed Mobility: Supine to Sit     Supine to sit: Min assist     General bed mobility comments: assist to move LLE out of bed  Transfers Overall transfer level: Needs assistance   Transfers: Sit to/from Stand Sit to Stand: Min guard         General transfer comment: cues for hand placement, sequence and safety. Increased difficulty with descent to low surface. Brother present and educated for car transfer  Ambulation/Gait Ambulation/Gait assistance: Min Conservator, museum/gallery (Feet): 80 Feet Assistive device: Rolling walker (2 wheeled) Gait Pattern/deviations: Step-to pattern;Decreased stride length;Decreased weight shift to left;Decreased stance time - left   Gait velocity interpretation: <1.8 ft/sec, indicate of risk for recurrent falls General Gait Details: pt with antalgic gait  with decreased hip flexion, swing and stance on LLE, cues for sequence and direction.   Stairs             Wheelchair Mobility    Modified Rankin (Stroke Patients Only)       Balance                                            Cognition Arousal/Alertness: Awake/alert Behavior During Therapy: WFL for tasks assessed/performed Overall Cognitive Status: Within Functional Limits for tasks assessed                                        Exercises Total Joint Exercises Hip ABduction/ADduction: AROM;Left;Standing;10 reps Knee Flexion: AROM;Left;Standing;10 reps Marching in Standing: AROM;Left;Standing;5 reps Standing Hip Extension: AROM;Left;Standing;10 reps    General Comments        Pertinent Vitals/Pain Pain Score: 8  Pain Location: left hip Pain Descriptors / Indicators: Aching;Constant Pain Intervention(s): Limited activity within patient's tolerance;Premedicated before session;Monitored during session;Repositioned    Home Living                      Prior Function            PT Goals (current goals can now be found in the care plan section) Progress towards PT goals: Progressing toward goals    Frequency    7X/week      PT Plan Current plan remains appropriate  Co-evaluation              AM-PAC PT "6 Clicks" Mobility   Outcome Measure  Help needed turning from your back to your side while in a flat bed without using bedrails?: A Little Help needed moving from lying on your back to sitting on the side of a flat bed without using bedrails?: A Little Help needed moving to and from a bed to a chair (including a wheelchair)?: A Little Help needed standing up from a chair using your arms (e.g., wheelchair or bedside chair)?: A Little Help needed to walk in hospital room?: A Little Help needed climbing 3-5 steps with a railing? : A Little 6 Click Score: 18    End of Session   Activity Tolerance:  Patient tolerated treatment well Patient left: in chair;with call bell/phone within reach;with family/visitor present Nurse Communication: Mobility status;Weight bearing status PT Visit Diagnosis: Other abnormalities of gait and mobility (R26.89);Difficulty in walking, not elsewhere classified (R26.2);Pain Pain - part of body: Hip;Ankle and joints of foot;Knee     Time: [id number] PT Time Calculation (min) (ACUTE ONLY): 27 min  Charges:  $Gait Training: 8-22 mins $Therapeutic Exercise: 8-22 mins                     [place], PT Acute Rehabilitation Services Pager: [phone] Office: [place] 06/07/2019, 12:28 PM

## 2019-06-07 NOTE — Progress Notes (Signed)
   Subjective: 1 Day Post-Op Procedure(s) (LRB): LEFT TOTAL HIP ARTHROPLASTY-DIRECT ANTERIOR (Left) Patient reports pain as moderate and severe.    Objective: Vital signs in last 24 hours: Temp:  [97.8 F (36.6 C)-99.3 F (37.4 C)] 99.3 F (37.4 C) (12/31 0335) Pulse Rate:  [44-72] 72 (12/31 0335) Resp:  [13-21] 14 (12/30 1653) BP: (115-159)/(68-92) 143/76 (12/31 0335) SpO2:  [94 %-100 %] 94 % (12/31 0335) Weight:  [68.5 kg] 68.5 kg (12/30 1003)  Intake/Output from previous day: 12/30 0701 - 12/31 0700 In: 1811.8 [P.O.:50; I.V.:1761.8] Out: 900 [Urine:600; Blood:300] Intake/Output this shift: No intake/output data recorded.  Recent Labs    06/07/19 0220  HGB 13.0   Recent Labs    06/07/19 0220  WBC 9.0  RBC 4.26  HCT 38.1*  PLT 143*   Recent Labs    06/07/19 0220  NA 131*  K 4.2  CL 98  CO2 24  BUN 7  CREATININE 0.98  GLUCOSE 140*  CALCIUM 8.2*   No results for input(s): LABPT, INR in the last 72 hours.  Neurologically intact DG C-Arm 1-60 Min  Result Date: 06/06/2019 CLINICAL DATA:  Left hip arthroplasty EXAM: OPERATIVE LEFT HIP (WITH PELVIS IF PERFORMED) AP VIEWS TECHNIQUE: Fluoroscopic spot image(s) were submitted for interpretation post-operatively. COMPARISON:  05/19/2019 FINDINGS: 2 C-arm fluoroscopic images were obtained intraoperatively and submitted for post operative interpretation. Interval placement of left total hip arthroplasty hardware which appears in its expected alignment. No obvious intraoperative complication on the provided images. Please see the performing provider's procedural report for further detail. IMPRESSION: As above. Electronically Signed   By: Davina Poke D.O.   On: 06/06/2019 14:47   DG Hip Port Unilat With Pelvis 1V Left  Result Date: 06/06/2019 CLINICAL DATA:  Post hip replacement. EXAM: DG HIP (WITH OR WITHOUT PELVIS) 1V PORT LEFT COMPARISON:  CT hip dated 05/19/2019. FINDINGS: The patient is status post total hip  arthroplasty on the left. Alignment appears near anatomic. There are expected postsurgical changes including subcutaneous gas and overlying soft tissue edema. IMPRESSION: Status post total hip arthroplasty on the left without evidence of hardware complication. Electronically Signed   By: Constance Holster M.D.   On: 06/06/2019 15:51   DG HIP OPERATIVE UNILAT W OR W/O PELVIS LEFT  Result Date: 06/06/2019 CLINICAL DATA:  Left hip arthroplasty EXAM: OPERATIVE LEFT HIP (WITH PELVIS IF PERFORMED) AP VIEWS TECHNIQUE: Fluoroscopic spot image(s) were submitted for interpretation post-operatively. COMPARISON:  05/19/2019 FINDINGS: 2 C-arm fluoroscopic images were obtained intraoperatively and submitted for post operative interpretation. Interval placement of left total hip arthroplasty hardware which appears in its expected alignment. No obvious intraoperative complication on the provided images. Please see the performing provider's procedural report for further detail. IMPRESSION: As above. Electronically Signed   By: Davina Poke D.O.   On: 06/06/2019 14:47    Assessment/Plan: 1 Day Post-Op Procedure(s) (LRB): LEFT TOTAL HIP ARTHROPLASTY-DIRECT ANTERIOR (Left) Up with therapy, she is with pt now. Discharge today. Rx sent in office 2 wks.   Austin [name] 06/07/2019, 7:48 AM

## 2019-06-07 NOTE — TOC Transition Note (Signed)
Transition of Care [facility]) - CM/SW Discharge Note   Patient Details  Name: Austin [name] MRN: [id number] Date of Birth: 09-20-1965  Transition of Care [facility]) CM/SW Contact:  Midge Minium MSN, RN, NCM-BC, ACM-RN [phone] Phone Number: 06/07/2019, 10:16 AM   Clinical Narrative:    CM following for dispositional needs. CM spoke to the patient to discuss the POC. Patient is a 53 yo admitted for Left THA direct anterior due to AVN. PMhx: glaucoma with bil blindness, prosthetic Rt eye, corneal transplant, alcoholic pancreatitis, HTN. PT eval completed with HHPT/DME recommended with the patient agreeable. PCP/Demo verified. CMS HH compare and DME list provided with no preference. Patient verbalized his family will provide transportation home and assistance post-discharge. DME referral given to [place] ([place] liaison); [place] referral given to [facility] liaison); AVS updated. DME will be delivered to the patients room prior to discharge. No further needs from CM.    Final next level of care: [place] Barriers to Discharge: No Barriers Identified   Patient Goals and CMS Choice Patient states their goals for this hospitalization and ongoing recovery are:: "to get stronger" CMS Medicare.gov Compare Post Acute Care list provided to:: Patient Choice offered to / list presented to : Patient  Discharge Plan and Services                DME Arranged: Bedside commode, Walker rolling DME Agency: [employer] Date DME Agency Contacted: 06/07/19 Time DME Agency Contacted: [phone] Representative spoke with at [place]: [place] (liaison) [employer] Arranged: PT [place]: [facility] [employer] [facility] [employer] Date [place]: 06/07/19 Time [place]: [place] Representative spoke with at [place]: [place] (liaison)  Social Determinants of Health ([place]) Interventions     Readmission Risk Interventions No flowsheet data found.

## 2019-06-07 NOTE — Evaluation (Signed)
Physical Therapy Evaluation Patient Details Name: Austin [name] MRN: [id number] DOB: 1966-01-17 Today's Date: 06/07/2019   History of Present Illness  53 yo admitted for Left THA direct anterior due to AVN. PMhx: glaucoma with bil blindness, prosthetic Rt eye, corneal transplant, alcoholic pancreatitis, HTN  Clinical Impression  Pt premedicated and reports continued 8/10 pain with hip, knee and ankle on left with some improvement with mobility but limited by pain and nausea with pt refusing OOB to chair end of session. Pt reports brother to stay with him and assist at D/C. Per pt request performed stairs this am for D/C with limited instruction for HEP due to pt pain and deferral of further activity. Pt with decreased strength, ROM, transfers and gait who will benefit from acute therapy to maximize mobility, function, safety and gait to decrease burden of care.      Follow Up Recommendations Home health PT    Equipment Recommendations  Rolling walker with 5" wheels;3in1 (PT)    Recommendations for Other Services OT consult     Precautions / Restrictions Precautions Precautions: Fall Precaution Comments: blind Restrictions LLE Weight Bearing: Weight bearing as tolerated      Mobility  Bed Mobility Overal bed mobility: Needs Assistance Bed Mobility: Supine to Sit;Sit to Supine     Supine to sit: Min assist Sit to supine: Min assist   General bed mobility comments: cues for sequence with assist to move LLE into and out of bed  Transfers Overall transfer level: Needs assistance   Transfers: Sit to/from Stand Sit to Stand: Min guard         General transfer comment: cues for hand placement, sequence and safety  Ambulation/Gait Ambulation/Gait assistance: Min guard Gait Distance (Feet): 110 Feet   Gait Pattern/deviations: Step-to pattern;Decreased stride length;Decreased weight shift to left;Decreased stance time - left   Gait velocity interpretation: <1.8 ft/sec,  indicate of risk for recurrent falls General Gait Details: pt with antalgic gait with decreased hip flexion, swing and stance on LLE, cues for sequence and direction.  Stairs Stairs: Yes Stairs assistance: Min guard Stair Management: Step to pattern;Sideways;One rail Left Number of Stairs: 3 General stair comments: pt able to perform sidestepping with left rail with guarding with fatigue and pain. pt denied attempting backward with RW or therapist shoulders with verbal instruction for backward with brothers' shoulders on 2 steps without rail  Wheelchair Mobility    Modified Rankin (Stroke Patients Only)       Balance                                             Pertinent Vitals/Pain Pain Assessment: 0-10 Pain Score: 8  Pain Location: left hip Pain Descriptors / Indicators: Aching;Constant Pain Intervention(s): Limited activity within patient's tolerance;Monitored during session;Premedicated before session;Repositioned    Home Living Family/patient expects to be discharged to:: Private residence Living Arrangements: Alone Available Help at Discharge: Family;Available 24 hours/day Type of Home: House Home Access: Stairs to enter   [employer] of Steps: 3 Home Layout: One level Home Equipment: Walker - 2 wheels;Cane - single point      Prior Function Level of Independence: Independent with assistive device(s)         Comments: cane for mobility, scat for transportation     Hand Dominance        Extremity/Trunk Assessment   Upper Extremity Assessment  Upper Extremity Assessment: Overall WFL for tasks assessed    Lower Extremity Assessment Lower Extremity Assessment: LLE deficits/detail LLE Deficits / Details: decreased ROm and strength due to post op pain LLE: Unable to fully assess due to pain    Cervical / Trunk Assessment Cervical / Trunk Assessment: Normal  Communication   Communication: No difficulties  Cognition  Arousal/Alertness: Awake/alert Behavior During Therapy: WFL for tasks assessed/performed Overall Cognitive Status: Within Functional Limits for tasks assessed                                        General Comments      Exercises Total Joint Exercises Heel Slides: AAROM;Left;Supine;5 reps Hip ABduction/ADduction: AAROM;Left;Supine;5 reps   Assessment/Plan    PT Assessment Patient needs continued PT services  PT Problem List Decreased strength;Decreased mobility;Decreased safety awareness;Decreased range of motion;Decreased activity tolerance;Decreased knowledge of use of DME;Pain       PT Treatment Interventions Gait training;DME instruction;Therapeutic exercise;Stair training;Functional mobility training;Therapeutic activities;Patient/family education    PT Goals (Current goals can be found in the Care Plan section)  Acute Rehab PT Goals Patient Stated Goal: return home and to work PT Goal Formulation: With patient Time For Goal Achievement: 06/08/19 Potential to Achieve Goals: Good    Frequency 7X/week   Barriers to discharge   pt reports brother to stay with him at least a week    Co-evaluation               AM-PAC PT "6 Clicks" Mobility  Outcome Measure Help needed turning from your back to your side while in a flat bed without using bedrails?: A Little Help needed moving from lying on your back to sitting on the side of a flat bed without using bedrails?: A Little Help needed moving to and from a bed to a chair (including a wheelchair)?: A Little Help needed standing up from a chair using your arms (e.g., wheelchair or bedside chair)?: A Little Help needed to walk in hospital room?: A Little Help needed climbing 3-5 steps with a railing? : A Little 6 Click Score: 18    End of Session   Activity Tolerance: Patient tolerated treatment well Patient left: in bed;with call bell/phone within reach;with nursing/sitter in room;with bed alarm  set Nurse Communication: Mobility status;Weight bearing status PT Visit Diagnosis: Other abnormalities of gait and mobility (R26.89);Difficulty in walking, not elsewhere classified (R26.2);Pain Pain - Right/Left: Left Pain - part of body: Hip;Ankle and joints of foot;Knee    Time: [id number] PT Time Calculation (min) (ACUTE ONLY): 41 min   Charges:   PT Evaluation $PT Eval Moderate Complexity: 1 Mod PT Treatments $Gait Training: 8-22 mins $Therapeutic Activity: 8-22 mins        Hiromi Knodel P, PT Acute Rehabilitation Services Pager: [phone] Office: [phone]   Teddie Curd B Abbegail Matuska 06/07/2019, 8:24 AM

## 2019-06-07 NOTE — Progress Notes (Signed)
Pt is A&O x4. Walked with PT twice today. Left hip incision with staples dry and intact. Dressing changed with Mipilex dressing as ordered.  Discharge instructions given to pt and his brother.  Verbalized understanding.  Discharged to home accompanied by his brother.

## 2019-06-07 NOTE — Evaluation (Addendum)
Occupational Therapy Evaluation Patient Details Name: Austin [name] MRN: [id number] DOB: 1965-10-02 Today's Date: 06/07/2019    History of Present Illness 53 yo admitted for Left THA direct anterior due to AVN. PMhx: glaucoma with bil blindness, prosthetic Rt eye, corneal transplant, alcoholic pancreatitis, HTN   Clinical Impression   Pt was functioning modified independently in ADL and walking with an O & M cane prior to admission. Pt lives alone and will be going home with intermittent assistance of his family. Pt currently requires set up to max assist for ADL. Educated in compensatory strategies, but pt unable to use AE due to lack of vision. Recommended tub transfer bench and educated in multiple uses of 3 in 1. Instructed at length in fall prevention and setting up pt to minimize having to ambulate when family is not with him.    Follow Up Recommendations  Home health OT , 24 hour supervision   Equipment Recommendations  3 in 1 bedside commode;Tub/shower bench    Recommendations for Other Services       Precautions / Restrictions Precautions Precautions: Fall Precaution Comments: blind Restrictions Weight Bearing Restrictions: Yes LLE Weight Bearing: Weight bearing as tolerated      Mobility Bed Mobility Overal bed mobility: Needs Assistance Bed Mobility: Supine to Sit     Supine to sit: Min assist     General bed mobility comments: pt received in chair  Transfers Overall transfer level: Needs assistance Equipment used: Rolling walker (2 wheeled) Transfers: Sit to/from Stand Sit to Stand: Min guard         General transfer comment: cues for hand placement, sequence and safety. Increased difficulty with descent to low surface. Brother present.    Balance                                           ADL either performed or assessed with clinical judgement   ADL Overall ADL's : Needs assistance/impaired Eating/Feeding: Independent    Grooming: Set up;Sitting   Upper Body Bathing: Set up;Sitting   Lower Body Bathing: Maximal assistance;Sit to/from stand   Upper Body Dressing : Set up;Sitting   Lower Body Dressing: Maximal assistance;Sit to/from stand   Toilet Transfer: Minimal assistance;RW;BSC   Toileting- Water quality scientist and Hygiene: Min guard;Sit to/from Nurse, children's Details (indicate cue type and reason): recommended tub transfer bench Functional mobility during ADLs: Minimal assistance;Rolling walker General ADL Comments: Pt educated in use of leg lifter and multiple uses of 3 in 1. Family to set up meals/cooler, [facility] and urinals near pt when he is alone. Pt is heavily dependent on his and UEs to navigate in his environment due to his blindness.     Vision Baseline Vision/History: Legally blind Patient Visual Report: No change from baseline       Perception     Praxis      Pertinent Vitals/Pain Pain Assessment: Faces Pain Score: 8  Faces Pain Scale: Hurts even more Pain Location: left hip Pain Descriptors / Indicators: Aching;Constant Pain Intervention(s): Monitored during session;Repositioned;Patient requesting pain meds-RN notified     Hand Dominance Right   Extremity/Trunk Assessment Upper Extremity Assessment Upper Extremity Assessment: Overall WFL for tasks assessed   Lower Extremity Assessment Lower Extremity Assessment: Defer to PT evaluation   Cervical / Trunk Assessment Cervical / Trunk Assessment: Normal   Communication Communication Communication: No difficulties  Cognition Arousal/Alertness: Awake/alert Behavior During Therapy: WFL for tasks assessed/performed Overall Cognitive Status: Within Functional Limits for tasks assessed                                     General Comments       Exercises    Shoulder Instructions      Home Living Family/patient expects to be discharged to:: Private residence Living Arrangements:  Alone Available Help at Discharge: Family;Available PRN/intermittently Type of Home: House Home Access: Stairs to enter [employer] of Steps: 3   Home Layout: One level     Bathroom Shower/Tub: Teacher, early years/pre: Standard     Home Equipment: Environmental consultant - 2 wheels( O & M cane)          Prior Functioning/Environment Level of Independence: Independent with assistive device(s)        Comments: walks with an O & M cane, scat for transportation        OT Problem List: Decreased strength;Decreased activity tolerance;Impaired balance (sitting and/or standing);Impaired vision/perception;Decreased knowledge of use of DME or AE;Pain      OT Treatment/Interventions:      OT Goals(Current goals can be found in the care plan section) Acute Rehab OT Goals Patient Stated Goal: return home and to work OT Goal Formulation: With patient Time For Goal Achievement: 06/21/19 Potential to Achieve Goals: Good  OT Frequency:     Barriers to D/C:            Co-evaluation              AM-PAC OT "6 Clicks" Daily Activity     Outcome Measure Help from another person eating meals?: None Help from another person taking care of personal grooming?: A Little Help from another person toileting, which includes using toliet, bedpan, or urinal?: A Little Help from another person bathing (including washing, rinsing, drying)?: A Lot Help from another person to put on and taking off regular upper body clothing?: None Help from another person to put on and taking off regular lower body clothing?: A Lot 6 Click Score: 18   End of Session Equipment Utilized During Treatment: Rolling walker;Gait belt Nurse Communication: Patient requests pain meds  Activity Tolerance: Patient tolerated treatment well Patient left: in chair;with call bell/phone within reach;with family/visitor present  OT Visit Diagnosis: Unsteadiness on feet (R26.81);Other abnormalities of gait and  mobility (R26.89);Low vision, both eyes (H54.2);Pain                Time: [id number] OT Time Calculation (min): 32 min Charges:  OT General Charges $OT Visit: 1 Visit OT Evaluation $OT Eval Moderate Complexity: 1 Mod OT Treatments $Self Care/Home Management : 8-22 mins  Nestor Lewandowsky, OTR/L Acute Rehabilitation Services Pager: [phone] Office: [phone]  Malka So 06/07/2019, 1:38 PM

## 2019-06-07 NOTE — Progress Notes (Signed)
[facility] would not allow sending robaxin despite several tries. Percocet went thru and I called robaxin in to [place] for him.

## 2019-06-07 NOTE — Discharge Instructions (Signed)

## 2019-06-11 ENCOUNTER — Telehealth: Payer: Self-pay | Admitting: Orthopaedic Surgery

## 2019-06-11 NOTE — Telephone Encounter (Signed)
Received call from [facility] with [place] needing start of care orders 06/12/2019. The number to contact Joycelyn Schmid is [phone]

## 2019-06-12 ENCOUNTER — Telehealth: Payer: Self-pay | Admitting: Orthopaedic Surgery

## 2019-06-12 NOTE — Telephone Encounter (Signed)
Ok 2 to 3 X per week times 2 wks then stop thanks

## 2019-06-12 NOTE — Telephone Encounter (Signed)
I called Margrette and advised.

## 2019-06-12 NOTE — Telephone Encounter (Signed)
I called Dorothea Ogle and advised ok.

## 2019-06-12 NOTE — Telephone Encounter (Signed)
Margrette Dealer from [facility] called requesting verbal orders for today. Margrette stated if Dr. Lorin Mercy can approve for patient's PT today. Message is sent High [employer]. Margrette from [facility] phone number is [phone].

## 2019-06-12 NOTE — Telephone Encounter (Signed)
2-3x /week x 2 weeks Ok per Dr. Lorin Mercy

## 2019-06-12 NOTE — Telephone Encounter (Signed)
Noted. I spoke with Margrette and advised.

## 2019-06-12 NOTE — Telephone Encounter (Signed)
Ok for orders? 

## 2019-06-12 NOTE — Telephone Encounter (Signed)
Tyler from [place] at [employer]. The patient was supposed to start today but refused to be seen. They are requesting verbal orders to start tomorrow instead.   Call back number: [phone]

## 2019-06-13 ENCOUNTER — Telehealth: Payer: Self-pay | Admitting: Orthopaedic Surgery

## 2019-06-13 NOTE — Telephone Encounter (Signed)
Yes Ok thank you, sorry I missed that one.

## 2019-06-13 NOTE — Telephone Encounter (Signed)
Please advise 

## 2019-06-13 NOTE — Telephone Encounter (Signed)
Dorothea Ogle from [facility] called requesting verbal orders for patient to have twice a week for one week and 3 times a week for one week of physical therapy. Also requesting a medical social worker and orders for a shower or tub chair. Tyler asked for this order to be faxed at [phone]. Dorothea Ogle stated if need to contact leave detailed message on cell phone number [phone].

## 2019-06-13 NOTE — Telephone Encounter (Signed)
Would you like for me to enter order for social worker as well?

## 2019-06-13 NOTE — Telephone Encounter (Signed)
OK for therapy,  OK for 3 in 1 chair.    ( medicare does not cover shower chair , no medicaid but 3 in 1 will work )

## 2019-06-14 NOTE — Telephone Encounter (Signed)
faxed

## 2019-06-18 NOTE — Discharge Summary (Signed)
Patient ID: Austin [name] MRN: [id number] DOB/AGE: [birth year] 54 y.o.  Admit date: 06/06/2019 Discharge date: 06/18/2019  Admission Diagnoses:  Active Problems:   Avascular necrosis of bone of left hip (HCC)   Arthritis of left hip   Discharge Diagnoses:  Active Problems:   Avascular necrosis of bone of left hip (HCC)   Arthritis of left hip  status post Procedure(s): LEFT TOTAL HIP ARTHROPLASTY-DIRECT ANTERIOR  Past Medical History:  Diagnosis Date  . Alcohol abuse   . Anxiety   . Arthritis   . 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 Elevated LFTs   . Foot and toe(s), blister    bilat feet, blistering between toes  . GERD (gastroesophageal reflux disease)   . Glaucoma   . Glaucoma as birth trauma   . Headache(784.0)   . Hypertension    hx of  . Insomnia   . Pancreatitis   . UTI (lower urinary tract infection) 08/04/11   finishing abx tomorrow  . Weight loss    25 lb wt loss since 08/2010  . Wrist fracture, right     Surgeries: Procedure(s): LEFT TOTAL HIP ARTHROPLASTY-DIRECT ANTERIOR on 06/06/2019   Consultants:   Discharged Condition: Improved  Hospital Course: Austin [name] is an 54 y.o. male who was admitted 06/06/2019 for operative treatment of left hip arthritis.   Patient failed conservative treatments (please see the history and physical for the specifics) and had severe unremitting pain that affects sleep, daily activities and work/hobbies. After pre-op clearance, the patient was taken to the operating room on 06/06/2019 and underwent  Procedure(s): LEFT TOTAL HIP ARTHROPLASTY-DIRECT ANTERIOR.    Patient was given perioperative antibiotics:  Anti-infectives (From admission, onward)   Start     Dose/Rate Route Frequency Ordered Stop   06/06/19 1015  ceFAZolin (ANCEF) IVPB 2g/100 mL premix     2 g 200 mL/hr over 30 Minutes Intravenous On call to O.R. 06/06/19 1004 06/06/19 1306       Patient was given sequential compression devices and  early ambulation to prevent DVT.   Patient benefited maximally from hospital stay and there were no complications. At the time of discharge, the patient was urinating/moving their bowels without difficulty, tolerating a regular diet, pain is controlled with oral pain medications and they have been cleared by PT/OT.   Recent vital signs: No data found.   Recent laboratory studies: No results for input(s): WBC, HGB, HCT, PLT, NA, K, CL, CO2, BUN, CREATININE, GLUCOSE, INR, CALCIUM in the last 72 hours.  Invalid input(s): PT, 2   Discharge Medications:   Allergies as of 06/07/2019   No Known Allergies     Medication List    STOP taking these medications   meloxicam 15 MG tablet Commonly known as: Mobic   traMADol 50 MG tablet Commonly known as: ULTRAM     TAKE these medications   aspirin 325 MG tablet Commonly known as: Bayer Aspirin Take 1 tablet (325 mg total) by mouth daily. Take one bayer aspirin daily for 4 wks   FLAXSEED OIL PO Take 1 capsule by mouth 2 (two) times daily.   folic acid 1 MG tablet Commonly known as: FOLVITE Take 1 tablet (1 mg total) by mouth daily.   lipase/protease/amylase 36000 UNITS Cpep capsule Commonly known as: Creon Three capsules with meals And one with snacks   methocarbamol 500 MG tablet Commonly known as: ROBAXIN Take 1 tablet (500 mg total) by mouth every  6 (six) hours as needed for muscle spasms.   multivitamin with minerals Tabs tablet Take 1 tablet by mouth daily.   OVER THE COUNTER MEDICATION Take 1 capsule by mouth daily. Sea [place]   oxyCODONE-acetaminophen 5-325 MG tablet Commonly known as: Percocet Take 1-2 tablets by mouth every 4 (four) hours as needed for severe pain. Post hip replacement pain What changed:   how much to take  when to take this  additional instructions       Diagnostic Studies: CT Hip Left Wo Contrast  Result Date: 05/19/2019 CLINICAL DATA:  Left hip pain. EXAM: CT OF THE LEFT HIP WITHOUT  CONTRAST TECHNIQUE: Multidetector CT imaging of the left hip was performed according to the standard protocol. Multiplanar CT image reconstructions were also generated. 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 Joint spaces preserved. Small joint effusion. The acetabulum appears normal. Visualized pelvic bones are normal. Muscles and Tendons Normal. Soft tissues Normal. 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 The patient has similar findings of the right femoral head. Electronically Signed   By: Lorriane Shire M.D.   On: 05/19/2019 15:39   DG C-Arm 1-60 Min  Result Date: 06/06/2019 CLINICAL DATA:  Left hip arthroplasty EXAM: OPERATIVE LEFT HIP (WITH PELVIS IF PERFORMED) AP VIEWS TECHNIQUE: Fluoroscopic spot image(s) were submitted for interpretation post-operatively. COMPARISON:  05/19/2019 FINDINGS: 2 C-arm fluoroscopic images were obtained intraoperatively and submitted for post operative interpretation. Interval placement of left total hip arthroplasty hardware which appears in its expected alignment. No obvious intraoperative complication on the provided images. Please see the performing provider's procedural report for further detail. IMPRESSION: As above. Electronically Signed   By: Davina Poke D.O.   On: 06/06/2019 14:47   DG Hip Port Unilat With Pelvis 1V Left  Result Date: 06/06/2019 CLINICAL DATA:  Post hip replacement. EXAM: DG HIP (WITH OR WITHOUT PELVIS) 1V PORT LEFT COMPARISON:  CT hip dated 05/19/2019. FINDINGS: The patient is status post total hip arthroplasty on the left. Alignment appears near anatomic. There are expected postsurgical changes including subcutaneous gas and overlying soft tissue edema. IMPRESSION: Status post total hip arthroplasty on the left without evidence of hardware complication. Electronically  Signed   By: Constance Holster M.D.   On: 06/06/2019 15:51   DG HIP OPERATIVE UNILAT W OR W/O PELVIS LEFT  Result Date: 06/06/2019 CLINICAL DATA:  Left hip arthroplasty EXAM: OPERATIVE LEFT HIP (WITH PELVIS IF PERFORMED) AP VIEWS TECHNIQUE: Fluoroscopic spot image(s) were submitted for interpretation post-operatively. COMPARISON:  05/19/2019 FINDINGS: 2 C-arm fluoroscopic images were obtained intraoperatively and submitted for post operative interpretation. Interval placement of left total hip arthroplasty hardware which appears in its expected alignment. No obvious intraoperative complication on the provided images. Please see the performing provider's procedural report for further detail. IMPRESSION: As above. Electronically Signed   By: Davina Poke D.O.   On: 06/06/2019 14:47      Follow-up Information    [name], MD Follow up in 2 week(s).   Specialty: Orthopedic Surgery Contact information: [place] [address] [place] [facility] Follow up.   Why: Bedside commode and rolling walker Contact information: [address] [place] [address] [phone]        Home, [facility] Follow up.   Specialty: Home Health Services Why: Home Health Physical Therapy Contact  information: [address] [place] [address] [phone]           Discharge Plan:  discharge to home  Disposition:     Signed: Benjiman Lynn  06/18/2019, 3:13 PM

## 2019-06-20 ENCOUNTER — Other Ambulatory Visit: Payer: Self-pay

## 2019-06-20 ENCOUNTER — Ambulatory Visit (INDEPENDENT_AMBULATORY_CARE_PROVIDER_SITE_OTHER): Payer: Medicare Other

## 2019-06-20 ENCOUNTER — Ambulatory Visit (INDEPENDENT_AMBULATORY_CARE_PROVIDER_SITE_OTHER): Payer: Medicare Other | Admitting: Orthopaedic Surgery

## 2019-06-20 ENCOUNTER — Encounter: Payer: Self-pay | Admitting: Orthopaedic Surgery

## 2019-06-20 ENCOUNTER — Telehealth: Payer: Self-pay | Admitting: Orthopaedic Surgery

## 2019-06-20 VITALS — Ht 67.0 in | Wt 151.0 lb

## 2019-06-20 DIAGNOSIS — Z96642 Presence of left artificial hip joint: Secondary | ICD-10-CM | POA: Insufficient documentation

## 2019-06-20 MED ORDER — OXYCODONE-ACETAMINOPHEN 5-325 MG PO TABS: 1.0000 | ORAL_TABLET | ORAL | 0 refills | Status: DC | PRN

## 2019-06-20 NOTE — Progress Notes (Signed)
Post-Op Visit Note   Patient: Austin [name]           Date of Birth: 07-20-1965           MRN: [id number] Visit Date: 06/20/2019 PCP: [facility]   Assessment & Plan: Post left hip arthroplasty anterior approach for AVN with collapse.  Is has a small amount of swelling not enough to aspirate.  Staples are harvested incision looks good.  He is walking with his walker.  Chief Complaint:  Chief Complaint  Patient presents with  . Left Hip - Routine Post Op    06/06/2019 Left THA   Visit Diagnoses:  1. Status post total hip replacement, left     Plan: percocet renewed. ROV one month  Follow-Up Instructions: No follow-ups on file.   Orders:  Orders Placed This Encounter  Procedures  . XR HIP UNILAT W OR W/O PELVIS 2-3 VIEWS LEFT   No orders of the defined types were placed in this encounter.   Imaging: No results found.  PMFS History: Patient Active Problem List   Diagnosis Date Noted  . Arthritis of left hip 06/06/2019  . Avascular necrosis of bone of left hip ([place]) 05/22/2019  . Intractable nausea and vomiting 12/29/2018  . 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 Essential hypertension 04/14/2018  . Blindness of both eyes 04/14/2018  . Alcohol use 04/14/2018  . Hyponatremia 04/14/2018  . RUQ pain   . Abnormal CT of the abdomen   . 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 Retained orthopedic hardware 08/07/2011    Class: Chronic   Past Medical History:  Diagnosis Date  . Alcohol abuse   . Anxiety   . Arthritis   . 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 Elevated LFTs   . Foot and toe(s), blister    bilat feet, blistering between toes  . GERD (gastroesophageal reflux disease)   . Glaucoma   . Glaucoma as birth trauma   . Headache(784.0)   . Hypertension    hx of  . Insomnia   . Pancreatitis   . UTI (lower urinary tract infection) 08/04/11   finishing abx tomorrow  . Weight loss    25 lb wt loss since 08/2010  . Wrist  fracture, right     Family History  Problem Relation Age of Onset  . Diabetes Sister   . Diabetes Maternal Aunt        aunt and uncles  . Hypertension Sister        aunt and uncles maternal side  . Colon cancer Neg Hx     Past Surgical History:  Procedure Laterality Date  . ESOPHAGOGASTRODUODENOSCOPY    . ESOPHAGOGASTRODUODENOSCOPY (EGD) WITH PROPOFOL N/A 05/09/2017   Procedure: ESOPHAGOGASTRODUODENOSCOPY (EGD) WITH PROPOFOL;  Surgeon: Doran Stabler, MD;  Location: [facility];  Service: Gastroenterology;  Laterality: N/A;  . EYE SURGERY Left   . EYE SURGERY Right    prostetic eye  . foot srugery     removal of bone near small toe  . FRACTURE SURGERY Right    wrist  . HARDWARE REMOVAL  08/06/2011   Procedure: HARDWARE REMOVAL;  Surgeon: Newt Minion, MD;  Location: [facility];  Service: Orthopedics;  Laterality: Right;  Removal Deep Retained Hardware Right Distal Radius  . MULTIPLE TOOTH EXTRACTIONS     for dentures  . TOTAL HIP ARTHROPLASTY Left 06/06/2019   Procedure: LEFT TOTAL HIP ARTHROPLASTY-DIRECT ANTERIOR;  Surgeon: [name], MD;  Location: [place];  Service: Orthopedics;  Laterality: Left;   Social History   Occupational History  . Occupation: disabled  Tobacco Use  . Smoking status: Current Every Day Smoker    Packs/day: 1.00    Years: 17.00    Pack years: 17.00    Types: Cigarettes, Cigars  . Smokeless tobacco: Never Used  . Tobacco comment: 1/2 - 1 cigar/day  Substance and Sexual Activity  . Alcohol use: Not Currently    Alcohol/week: 7.0 standard drinks    Types: 7 Standard drinks or equivalent per week  . Drug use: Yes    Types: Marijuana    Comment: smokes marijuana 1 to 2 times daily  . Sexual activity: Not Currently

## 2019-06-20 NOTE — Telephone Encounter (Signed)
Received call from [place] with [facility] advised patient is not quite ready to be discharged yet. Sonia Side asked if (PT) can be extended for 2 more weeks. The number to contact Sonia Side is [phone]

## 2019-06-20 NOTE — Telephone Encounter (Signed)
Patient expressed concern that he still needed assistance with walking when leaving office today. Please advise.

## 2019-06-21 NOTE — Telephone Encounter (Signed)
FYI , I called pt , I called Sonia Side. Pt staying with friends for the next 2 wks.so does not need PT. He is walking longer distances daily and doing HEP.   I stopped therapy

## 2019-06-21 NOTE — Telephone Encounter (Signed)
noted 

## 2019-07-06 ENCOUNTER — Telehealth: Payer: Self-pay | Admitting: Orthopaedic Surgery

## 2019-07-06 ENCOUNTER — Ambulatory Visit: Payer: Medicare Other | Admitting: Podiatry

## 2019-07-06 NOTE — Telephone Encounter (Signed)
Pt called in said he had surgery with dr.yates 06-05-20 and he states his medication Oxycodone isn't currently helping his pain. Pt states his wound is hurting a lot and is wondering if someone can come out to his home to take a look at it. Pt said he was tested 1/27 and was told today 1/29 he is positive for covid, pt is wondering if you would like for him to reschedule his appt for 07-20-19?  Pt call pt to advise of his current pain and upcoming appt.    [phone]

## 2019-07-06 NOTE — Telephone Encounter (Signed)
Pls advise. Thanks.  

## 2019-07-11 NOTE — Telephone Encounter (Signed)
I called and discussed. Been over a month since surgery. Time to stop narcotics. Getting over covid , coming in to see [place] in a couple weeks. Discussed he can take aleve or tylenol.

## 2019-07-20 ENCOUNTER — Ambulatory Visit: Payer: Medicare Other | Admitting: Orthopaedic Surgery

## 2019-07-24 ENCOUNTER — Ambulatory Visit: Payer: Medicare Other | Admitting: Orthopaedic Surgery

## 2019-07-27 ENCOUNTER — Other Ambulatory Visit: Payer: Self-pay

## 2019-07-27 ENCOUNTER — Encounter: Payer: Self-pay | Admitting: Orthopaedic Surgery

## 2019-07-27 ENCOUNTER — Ambulatory Visit (INDEPENDENT_AMBULATORY_CARE_PROVIDER_SITE_OTHER): Payer: Medicare Other | Admitting: Orthopaedic Surgery

## 2019-07-27 VITALS — Ht 67.0 in | Wt 151.0 lb

## 2019-07-27 DIAGNOSIS — Z96642 Presence of left artificial hip joint: Secondary | ICD-10-CM

## 2019-07-27 MED ORDER — TRAMADOL HCL 50 MG PO TABS: 50.0000 mg | ORAL_TABLET | Freq: Two times a day (BID) | ORAL | 0 refills | Status: DC | PRN

## 2019-07-27 NOTE — Progress Notes (Signed)
Patient returns post a left total hip arthroplasty for avascular necrosis with secondary osteoarthritis.  He has some pain in his right hip not as severe as his left.  He still has some aching pains has problems when he standing for a prolonged period of time or sitting with stiffness.  He is walking much better negative Trendelenburg gait today he is off his walker he is using his cane for the blind again.  He requested a note to resume work activity which was supplied.  I will check him back again in 2 months.  Prescription for Ultram sent in 1 p.o. twice daily 30 tablets to [facility].

## 2019-08-13 ENCOUNTER — Ambulatory Visit: Payer: Medicare Other | Admitting: Podiatry

## 2019-09-11 ENCOUNTER — Encounter: Payer: Self-pay | Admitting: Gastroenterology

## 2019-09-25 ENCOUNTER — Other Ambulatory Visit: Payer: Self-pay

## 2019-09-25 ENCOUNTER — Ambulatory Visit (INDEPENDENT_AMBULATORY_CARE_PROVIDER_SITE_OTHER): Payer: Medicare HMO | Admitting: Orthopaedic Surgery

## 2019-09-25 ENCOUNTER — Ambulatory Visit (INDEPENDENT_AMBULATORY_CARE_PROVIDER_SITE_OTHER): Payer: Medicare HMO

## 2019-09-25 ENCOUNTER — Encounter: Payer: Self-pay | Admitting: Orthopaedic Surgery

## 2019-09-25 VITALS — BP 145/84 | HR 73 | Ht 67.0 in | Wt 143.0 lb

## 2019-09-25 DIAGNOSIS — Z96642 Presence of left artificial hip joint: Secondary | ICD-10-CM

## 2019-09-25 NOTE — Progress Notes (Signed)
Office Visit Note   Patient: Austin [name]           Date of Birth: 1965-11-24           MRN: [id number] Visit Date: 09/25/2019              Requested by: [facility] [address],  [place] [address] PCP: [facility]   Assessment & Plan: Visit Diagnoses:  1. History of total left hip arthroplasty     Plan: We discussed he could try some Aspercreme.  Patient reassured x-rays look good.  He can follow-up if he has persistent problems.  Follow-Up Instructions: Return if symptoms worsen or fail to improve.   Orders:  Orders Placed This Encounter  Procedures  . XR HIP UNILAT W OR W/O PELVIS 2-3 VIEWS LEFT   No orders of the defined types were placed in this encounter.     Procedures: No procedures performed   Clinical Data: No additional findings.   Subjective: Chief Complaint  Patient presents with  . Left Hip - Follow-up    06/06/2019 Left THA    HPI six 54-year-old male returns now 4 months post total of arthroplasty.  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  He has had some tenderness over the anterior aspect of his incision where his belt rubs.  He had a direct anterior approach.  He is concerned there might be some problem with his hip.  He states he has not had discomfort sitting but once he stands and does a lot of twisting his pants and belt tends to aggravate the proximal portion of the incision.  He is not had any drainage.  Review of Systems updated unchanged since his surgery in December 2020 other than mentioned HPI.   Objective: Vital Signs: BP (!) 145/84   Pulse 73   Ht 5\' 7"  (1.702 m)   Wt 143 lb (64.9 kg)   BMI 22.40 kg/m   Physical Exam Constitutional:      Appearance: He is well-developed.  HENT:     Head: Normocephalic and atraumatic.  Eyes:     Comments: Prosthetic right eye, blind left eye with ulceration and a  brownish discoloration of the sclera.  Neck:     Thyroid: No thyromegaly.     Trachea: No tracheal deviation.  Cardiovascular:     Rate and Rhythm: Normal rate.  Pulmonary:     Effort: Pulmonary effort is normal.     Breath sounds: No wheezing.  Abdominal:     General: Bowel sounds are normal.     Palpations: Abdomen is soft.  Skin:    General: Skin is warm and dry.     Capillary Refill: Capillary refill takes less than 2 seconds.  Neurological:     Mental Status: He is alert and oriented to person, place, and time.  Psychiatric:        Behavior: Behavior normal.        Thought Content: Thought content normal.        Judgment: Judgment normal.     Ortho Exam well-healed hip incision some tenderness proximally but no fullness.  Lateral femoral cutaneous nerve is normal.  No weakness hip flexors normal heel toe gait with his white cane.Marland Kitchen  Specialty Comments:  No specialty comments available.  Imaging: XR HIP UNILAT W OR W/O PELVIS 2-3 VIEWS LEFT  Result Date: 09/25/2019 Standing AP pelvis to show hips and frog-leg lateral left hip obtained and reviewed.  This shows left total hip arthroplasty with good leg lengths.  No subsidence or loosening.  No soft tissue calcification. Impression: Satisfactory total hip arthroplasty.  Unchanged from 06/06/2019 Intra-Op images.    PMFS History: Patient Active Problem List   Diagnosis Date Noted  . 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 Intractable nausea and vomiting 12/29/2018  . 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 Essential hypertension 04/14/2018  . Blindness of both eyes 04/14/2018  . Alcohol use 04/14/2018  . Hyponatremia 04/14/2018  . RUQ pain   . Abnormal CT of the abdomen   . 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 Retained orthopedic hardware 08/07/2011    Class: Chronic   Past Medical History:  Diagnosis Date  . Alcohol abuse   . Anxiety   . Arthritis   . Blind in both eyes    sees lights and  shadows  . Elevated LFTs   . Foot and toe(s), blister    bilat feet, blistering between toes  . GERD (gastroesophageal reflux disease)   . Glaucoma   . Glaucoma as birth trauma   . Headache(784.0)   . Hypertension    hx of  . Insomnia   . Pancreatitis   . UTI (lower urinary tract infection) 08/04/11   finishing abx tomorrow  . Weight loss    25 lb wt loss since 08/2010  . Wrist fracture, right     Family History  Problem Relation Age of Onset  . Diabetes Sister   . Diabetes Maternal Aunt        aunt and uncles  . Hypertension Sister        aunt and uncles maternal side  . Colon cancer Neg Hx     Past Surgical History:  Procedure Laterality Date  . ESOPHAGOGASTRODUODENOSCOPY    . ESOPHAGOGASTRODUODENOSCOPY (EGD) WITH PROPOFOL N/A 05/09/2017   Procedure: ESOPHAGOGASTRODUODENOSCOPY (EGD) WITH PROPOFOL;  Surgeon: Doran Stabler, MD;  Location: [facility];  Service: Gastroenterology;  Laterality: N/A;  . EYE SURGERY Left   . EYE SURGERY Right    prostetic eye  . foot srugery     removal of bone near small toe  . FRACTURE SURGERY Right    wrist  . HARDWARE REMOVAL  08/06/2011   Procedure: HARDWARE REMOVAL;  Surgeon: Newt Minion, MD;  Location: [place];  Service: Orthopedics;  Laterality: Right;  Removal Deep Retained Hardware Right Distal Radius  . MULTIPLE TOOTH EXTRACTIONS     for dentures  . TOTAL HIP ARTHROPLASTY Left 06/06/2019   Procedure: LEFT TOTAL HIP ARTHROPLASTY-DIRECT ANTERIOR;  Surgeon: [name], MD;  Location: [facility];  Service: Orthopedics;  Laterality: Left;   Social History   Occupational History  . Occupation: disabled  Tobacco Use  . Smoking status: Current Every Day Smoker    Packs/day: 1.00    Years: 17.00    Pack years: 17.00    Types: Cigarettes, Cigars  . Smokeless tobacco: Never Used  . Tobacco comment: 1/2 - 1 cigar/day  Substance and Sexual Activity  . Alcohol use: Not Currently    Alcohol/week: 7.0 standard drinks    Types: 7  Standard drinks or equivalent per week  . Drug use: Yes    Types: Marijuana    Comment: smokes marijuana 1 to 2 times daily  . Sexual activity: Not Currently

## 2019-09-28 ENCOUNTER — Ambulatory Visit (AMBULATORY_SURGERY_CENTER): Payer: Self-pay | Admitting: *Deleted

## 2019-09-28 ENCOUNTER — Telehealth: Payer: Self-pay | Admitting: *Deleted

## 2019-09-28 ENCOUNTER — Other Ambulatory Visit: Payer: Self-pay

## 2019-09-28 VITALS — Temp 97.3°F | Ht 67.0 in | Wt 148.0 lb

## 2019-09-28 DIAGNOSIS — Z1211 Encounter for screening for malignant neoplasm of colon: Secondary | ICD-10-CM

## 2019-09-28 DIAGNOSIS — Z01818 Encounter for other preprocedural examination: Secondary | ICD-10-CM

## 2019-09-28 MED ORDER — NA SULFATE-K SULFATE-MG SULF 17.5-3.13-1.6 GM/177ML PO SOLN: ORAL | 0 refills | Status: DC

## 2019-09-28 NOTE — Progress Notes (Signed)
Patient is here in-person for PV. Care giver with the patient also in PV her temp=97.5. Patient denies any allergies to eggs or soy. Patient denies any problems with anesthesia/sedation. Patient denies any oxygen use at home. Patient denies taking any diet/weight loss medications or blood thinners. Patient is not being treated for MRSA or C-diff.  COVID-19 screening test is on 5/3 1130am, the pt is aware.  Patient is aware of our care-partner policy and 0000000 safety protocol.   Pt's care giver will assist him with prep instructions.

## 2019-09-28 NOTE — Telephone Encounter (Signed)
Dr.Danis,  This patient is here in Previsit for a screening colonoscopy. He is blind and has a care giver with him. He was referred to [place] by his PCP Dr.Ahunna Okwubunka-Anyim for screening colonoscopy. His last GI OV was on 02/07/2019 with Ellouise Newer. Please review. Ok for colonoscopy at [facility] at this time? Thank you, Esco Joslyn pv

## 2019-10-01 NOTE — Telephone Encounter (Signed)
Given his medical history, this patient would be best served by a clinic visit with Ellouise Newer, [place].

## 2019-10-02 ENCOUNTER — Emergency Department (HOSPITAL_COMMUNITY)
Admission: EM | Admit: 2019-10-02 | Discharge: 2019-10-03 | Disposition: A | Discharge status: Home / Self Care | Payer: Medicare HMO | Source: Home / Self Care | Attending: Emergency Medicine | Admitting: Emergency Medicine

## 2019-10-02 ENCOUNTER — Emergency Department (HOSPITAL_COMMUNITY): Payer: Medicare HMO

## 2019-10-02 ENCOUNTER — Encounter (HOSPITAL_COMMUNITY): Payer: Self-pay

## 2019-10-02 ENCOUNTER — Other Ambulatory Visit: Payer: Self-pay

## 2019-10-02 DIAGNOSIS — R0602 Shortness of breath: Secondary | ICD-10-CM

## 2019-10-02 DIAGNOSIS — R1084 Generalized abdominal pain: Secondary | ICD-10-CM

## 2019-10-02 DIAGNOSIS — Z20822 Contact with and (suspected) exposure to covid-19: Secondary | ICD-10-CM

## 2019-10-02 DIAGNOSIS — Z79899 Other long term (current) drug therapy: Secondary | ICD-10-CM | POA: Insufficient documentation

## 2019-10-02 DIAGNOSIS — F1210 Cannabis abuse, uncomplicated: Secondary | ICD-10-CM | POA: Insufficient documentation

## 2019-10-02 DIAGNOSIS — R112 Nausea with vomiting, unspecified: Secondary | ICD-10-CM

## 2019-10-02 DIAGNOSIS — F17210 Nicotine dependence, cigarettes, uncomplicated: Secondary | ICD-10-CM | POA: Insufficient documentation

## 2019-10-02 DIAGNOSIS — K852 Alcohol induced acute pancreatitis without necrosis or infection: Secondary | ICD-10-CM | POA: Diagnosis not present

## 2019-10-02 DIAGNOSIS — R079 Chest pain, unspecified: Secondary | ICD-10-CM

## 2019-10-02 LAB — CBC WITH DIFFERENTIAL/PLATELET
Abs Immature Granulocytes: 0 10*3/uL (ref 0.00–0.07)
Basophils Absolute: 0 10*3/uL (ref 0.0–0.1)
Basophils Relative: 0 %
Eosinophils Absolute: 0.1 10*3/uL (ref 0.0–0.5)
Eosinophils Relative: 2 %
HCT: 42.4 % (ref 39.0–52.0)
Hemoglobin: 14.6 g/dL (ref 13.0–17.0)
Immature Granulocytes: 0 %
Lymphocytes Relative: 26 %
Lymphs Abs: 1.5 10*3/uL (ref 0.7–4.0)
MCH: 31.7 pg (ref 26.0–34.0)
MCHC: 34.4 g/dL (ref 30.0–36.0)
MCV: 92 fL (ref 80.0–100.0)
Monocytes Absolute: 0.5 10*3/uL (ref 0.1–1.0)
Monocytes Relative: 9 %
Neutro Abs: 3.6 10*3/uL (ref 1.7–7.7)
Neutrophils Relative %: 63 %
Platelets: 125 10*3/uL — ABNORMAL LOW (ref 150–400)
RBC: 4.61 MIL/uL (ref 4.22–5.81)
RDW: 13.3 % (ref 11.5–15.5)
WBC: 5.8 10*3/uL (ref 4.0–10.5)
nRBC: 0 % (ref 0.0–0.2)

## 2019-10-02 LAB — COMPREHENSIVE METABOLIC PANEL
ALT: 18 U/L (ref 0–44)
AST: 29 U/L (ref 15–41)
Albumin: 3.9 g/dL (ref 3.5–5.0)
Alkaline Phosphatase: 107 U/L (ref 38–126)
Anion gap: 9 (ref 5–15)
BUN: 7 mg/dL (ref 6–20)
CO2: 26 mmol/L (ref 22–32)
Calcium: 8.7 mg/dL — ABNORMAL LOW (ref 8.9–10.3)
Chloride: 98 mmol/L (ref 98–111)
Creatinine, Ser: 0.95 mg/dL (ref 0.61–1.24)
GFR calc Af Amer: >60 mL/min (ref >60)
GFR calc non Af Amer: >60 mL/min (ref >60)
Glucose, Bld: 111 mg/dL — ABNORMAL HIGH (ref 70–99)
Potassium: 4.1 mmol/L (ref 3.5–5.1)
Sodium: 133 mmol/L — ABNORMAL LOW (ref 135–145)
Total Bilirubin: 0.9 mg/dL (ref 0.3–1.2)
Total Protein: 7.4 g/dL (ref 6.5–8.1)

## 2019-10-02 LAB — URINALYSIS, ROUTINE W REFLEX MICROSCOPIC
Bacteria, UA: NONE SEEN
Bilirubin Urine: NEGATIVE
Glucose, UA: NEGATIVE mg/dL
Ketones, ur: NEGATIVE mg/dL
Leukocytes,Ua: NEGATIVE
Nitrite: NEGATIVE
Protein, ur: NEGATIVE mg/dL
Specific Gravity, Urine: 1.004 — ABNORMAL LOW (ref 1.005–1.030)
pH: 7 (ref 5.0–8.0)

## 2019-10-02 LAB — TROPONIN I (HIGH SENSITIVITY): Troponin I (High Sensitivity): <2 ng/L (ref <18)

## 2019-10-02 LAB — LIPASE, BLOOD: Lipase: 34 U/L (ref 11–51)

## 2019-10-02 LAB — POC SARS CORONAVIRUS 2 AG -  ED: SARS Coronavirus 2 Ag: NEGATIVE

## 2019-10-02 MED ORDER — ONDANSETRON 4 MG PO TBDP
4.0000 mg | ORAL_TABLET | Freq: Once | ORAL | Status: AC
  Administered 2019-10-03: 01:00:00 4 mg via ORAL
  Filled 2019-10-02: qty 1

## 2019-10-02 MED ORDER — SODIUM CHLORIDE 0.9 % IV BOLUS
1000.0000 mL | Freq: Once | INTRAVENOUS | Status: AC
  Administered 2019-10-02: 1000 mL via INTRAVENOUS

## 2019-10-02 MED ORDER — ONDANSETRON HCL 4 MG/2ML IJ SOLN
4.0000 mg | Freq: Once | INTRAMUSCULAR | Status: AC
  Administered 2019-10-02: 4 mg via INTRAVENOUS
  Filled 2019-10-02: qty 2

## 2019-10-02 NOTE — ED Notes (Signed)
Patient provided with water for fluid challenge. Patient is tolerating fluids well at this time.

## 2019-10-02 NOTE — ED Notes (Signed)
Patient transported to ct

## 2019-10-02 NOTE — ED Provider Notes (Signed)
[facility] DEPT Provider Note   CSN: [id number] Arrival date & time: 10/02/19  1959     History No chief complaint on file.   Austin [name] is a 53 y.o. male past history of alcohol abuse, GERD, LFTs elevated, hypertension, pancreatitis who presents for evaluation of 3 days of generalized weakness, fatigue, generalized myalgias.  He also reports that he had nausea/vomiting that began yesterday as well as some chest pain, shortness of breath that began today.  Patient reports that he had been having symptoms for the last 3 days.  He states he feels sore and achy all over.  He states that today, he went and got tested and was positive for COVID-19.  He reports he started having some nausea/vomiting and abdominal pain that began yesterday.  He states he has been able to tolerate some water and some small liquids but then occasionally, he will have other episodes of vomiting.  He does not think that there has been blood in the vomit but he is also legally blind he has not been able to see it.  Patient reports that today, he had a syncopal episode.  He reports that he was going to the bathroom to go vomit and states that he fell and had a syncopal episode on the floor.  This was unwitnessed.  He does not think he hit his head here.  He did have LOC.  He is not on blood thinners.  He reports today, he started belching chest pain, shortness of breath.  He states the chest pain is intermittent and describes it as a soreness.  He states it is worse when he coughs.  Does not have chest pain at rest.  He has had a dry cough that is nonproductive.  He does not have any history of oxygen use at home.    The history is provided by the patient.       Past Medical History:  Diagnosis Date  . Alcohol abuse   . Anxiety   . Arthritis   . 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 Elevated LFTs   . Foot and toe(s), blister    bilat feet, blistering between toes  . GERD  (gastroesophageal reflux disease)   . Glaucoma   . Glaucoma as birth trauma   . Headache(784.0)   . Hypertension    hx of  . Insomnia   . Pancreatitis   . UTI (lower urinary tract infection) 08/04/11   finishing abx tomorrow  . Weight loss    25 lb wt loss since 08/2010  . Wrist fracture, right     Patient Active Problem List   Diagnosis Date Noted  . 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 Intractable nausea and vomiting 12/29/2018  . 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 Essential hypertension 04/14/2018  . Blindness of both eyes 04/14/2018  . Alcohol use 04/14/2018  . Hyponatremia 04/14/2018  . RUQ pain   . Abnormal CT of the abdomen   . 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 COLONOSCOPY  2006?  . ESOPHAGOGASTRODUODENOSCOPY    . ESOPHAGOGASTRODUODENOSCOPY (EGD) WITH PROPOFOL N/A 05/09/2017   Procedure: ESOPHAGOGASTRODUODENOSCOPY (EGD) WITH PROPOFOL;  Surgeon: Doran Stabler, MD;  Location: [facility];  Service: Gastroenterology;  Laterality: N/A;  . EYE SURGERY Left   . EYE  SURGERY Right    prostetic eye  . foot srugery     removal of bone near small toe  . FRACTURE SURGERY Right    wrist  . HARDWARE REMOVAL  08/06/2011   Procedure: HARDWARE REMOVAL;  Surgeon: Newt Minion, MD;  Location: [facility];  Service: Orthopedics;  Laterality: Right;  Removal Deep Retained Hardware Right Distal Radius  . MULTIPLE TOOTH EXTRACTIONS     for dentures  . TOTAL HIP ARTHROPLASTY Left 06/06/2019   Procedure: LEFT TOTAL HIP ARTHROPLASTY-DIRECT ANTERIOR;  Surgeon: [name], MD;  Location: [facility];  Service: Orthopedics;  Laterality: Left;       Family History  Problem Relation Age of Onset  . Diabetes Sister   . Diabetes Maternal Aunt        aunt and uncles  . Hypertension Sister        aunt and uncles maternal side  . Colon cancer Neg Hx    . Colon polyps Neg Hx   . Esophageal cancer Neg Hx   . Rectal cancer Neg Hx   . Stomach cancer Neg Hx     Social History   Tobacco Use  . Smoking status: Current Every Day Smoker    Packs/day: 0.25    Years: 17.00    Pack years: 4.25    Types: Cigars  . Smokeless tobacco: Never Used  . Tobacco comment: 3 cigars a day   Substance Use Topics  . Alcohol use: Yes    Comment: 1 pint a day of liquor per pt  . Drug use: Yes    Frequency: 7.0 times per week    Types: Marijuana    Comment: smokes marijuana 1 to 2 times daily    Home Medications Prior to Admission medications   Medication Sig Start Date End Date Taking? Authorizing Provider  ondansetron (ZOFRAN) 4 MG tablet Take 4 mg by mouth every 8 (eight) hours as needed for nausea or vomiting.  10/02/19  Yes [provider]  VITAMIN D PO Take by mouth.   Yes [provider]  zinc gluconate 50 MG tablet Take 50 mg by mouth daily. 09/10/19  Yes [provider]  folic acid (FOLVITE) 1 MG tablet Take 1 mg by mouth daily. 10/01/19   [provider]  Na Sulfate-K Sulfate-Mg Sulf 17.5-3.13-1.6 GM/177ML SOLN Suprep (no substitutions)-TAKE AS DIRECTED. Patient not taking: Reported on 10/02/2019 09/28/19   Doran Stabler, MD  ramelteon (ROZEREM) 8 MG tablet Take 8 mg by mouth at bedtime. 10/02/19   [provider]  traZODone (DESYREL) 50 MG tablet Take 50 mg by mouth at bedtime. 10/01/19   [provider]    Allergies    Patient has no known allergies.  Review of Systems   Review of Systems  Constitutional: Positive for appetite change and fatigue. Negative for fever.  Respiratory: Positive for cough and shortness of breath.   Cardiovascular: Positive for chest pain.  Gastrointestinal: Positive for abdominal pain, nausea and vomiting.  Genitourinary: Negative for dysuria and hematuria.  Musculoskeletal: Positive for myalgias.  Neurological: Positive for syncope. Negative for  headaches.  All other systems reviewed and are negative.   Physical Exam Updated Vital Signs BP (!) 157/94   Pulse 66   Temp 98.1 F (36.7 C) (Oral)   Resp 17   Ht 5\' 7"  (1.702 m)   Wt 66.7 kg   SpO2 100%   BMI 23.02 kg/m   Physical Exam Vitals and nursing note  reviewed.  Constitutional:      Appearance: Normal appearance. He is well-developed.  HENT:     Head: Normocephalic and atraumatic.     Comments: No tenderness to palpation of skull. No deformities or crepitus noted. No open wounds, abrasions or lacerations.  Eyes:     General: Lids are normal.  Neck:     Comments: Tenderness palpation noted to midline C-spine.  No deformity or crepitus noted.  Full range of motion without any difficulty. Cardiovascular:     Rate and Rhythm: Normal rate and regular rhythm.     Pulses: Normal pulses.          Radial pulses are 2+ on the right side and 2+ on the left side.     Heart sounds: Normal heart sounds. No murmur. No friction rub. No gallop.   Pulmonary:     Effort: Pulmonary effort is normal.     Breath sounds: Normal breath sounds.     Comments: Lungs clear to auscultation bilaterally.  Symmetric chest rise.  No wheezing, rales, rhonchi. Abdominal:     Palpations: Abdomen is soft. Abdomen is not rigid.     Tenderness: There is generalized abdominal tenderness. There is no guarding.     Hernia: A hernia is present. Hernia is present in the umbilical area.     Comments: Abd is soft, nondistended.  Generalized tenderness with no focal point.  No rigidity, guarding. Easily reducible umbilical hernia. No overlying warmth, erythema.   Musculoskeletal:        General: Normal range of motion.  Skin:    General: Skin is warm and dry.     Capillary Refill: Capillary refill takes less than 2 seconds.  Neurological:     Mental Status: He is alert and oriented to person, place, and time.     Comments: Limited vision secondary to glaucoma.  5/5 strength BUE and BLE  Psychiatric:         Speech: Speech normal.     ED Results / Procedures / Treatments   Labs (all labs ordered are listed, but only abnormal results are displayed) Labs Reviewed  COMPREHENSIVE METABOLIC PANEL - Abnormal; Notable for the following components:      Result Value   Sodium 133 (*)    Glucose, Bld 111 (*)    Calcium 8.7 (*)    All other components within normal limits  CBC WITH DIFFERENTIAL/PLATELET - Abnormal; Notable for the following components:   Platelets 125 (*)    All other components within normal limits  URINALYSIS, ROUTINE W REFLEX MICROSCOPIC - Abnormal; Notable for the following components:   Specific Gravity, Urine 1.004 (*)    Hgb urine dipstick LARGE (*)    All other components within normal limits  LIPASE, BLOOD  POC SARS CORONAVIRUS 2 AG -  ED  TROPONIN I (HIGH SENSITIVITY)  TROPONIN I (HIGH SENSITIVITY)    EKG EKG Interpretation  Date/Time:  Tuesday October 02 2019 23:04:18 EDT Ventricular Rate:  72 PR Interval:    QRS Duration: 80 QT Interval:  426 QTC Calculation: 467 R Axis:   30 Text Interpretation: Sinus rhythm Baseline wander Otherwise within normal limits Confirmed by Carmin Muskrat [phone]) on 10/02/2019 11:19:55 PM   Radiology CT Head Wo Contrast  Result Date: 10/02/2019 CLINICAL DATA:  Syncopal episode.  Weakness.  Coronavirus infection. EXAM: CT HEAD WITHOUT CONTRAST CT CERVICAL SPINE WITHOUT CONTRAST TECHNIQUE: Multidetector CT imaging of the head and cervical spine was performed following the standard protocol without  intravenous contrast. Multiplanar CT image reconstructions of the cervical spine were also generated. COMPARISON:  None. FINDINGS: CT HEAD FINDINGS Brain: The brain shows a normal appearance without evidence of malformation, atrophy, old or acute small or large vessel infarction, mass lesion, hemorrhage, hydrocephalus or extra-axial collection. Vascular: No hyperdense vessel. No evidence of atherosclerotic calcification. Skull: Normal.   No traumatic finding.  No focal bone lesion. Sinuses/Orbits: Visualized sinuses are clear. 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 Previous glaucoma surgery on the left. Other: None significant CT CERVICAL SPINE FINDINGS Alignment: Minimal scoliotic curvature. Skull base and vertebrae: No fracture or primary bone lesion. Soft tissues and spinal canal: No traumatic soft tissue finding. Disc levels: No significant degenerative change. No canal or foraminal stenosis. Upper chest: Pleural and parenchymal scarring at the apices. Other: None IMPRESSION: Normal appearance of the brain.  No traumatic finding. 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 Previous glaucoma surgery on the left. No acute or traumatic cervical spine finding. Electronically Signed   By: Nelson Chimes M.D.   On: 10/02/2019 21:06   CT Cervical Spine Wo Contrast  Result Date: 10/02/2019 CLINICAL DATA:  Syncopal episode.  Weakness.  Coronavirus infection. EXAM: CT HEAD WITHOUT CONTRAST CT CERVICAL SPINE WITHOUT CONTRAST TECHNIQUE: Multidetector CT imaging of the head and cervical spine was performed following the standard protocol without intravenous contrast. Multiplanar CT image reconstructions of the cervical spine were also generated. COMPARISON:  None. FINDINGS: CT HEAD FINDINGS Brain: The brain shows a normal appearance without evidence of malformation, atrophy, old or acute small or large vessel infarction, mass lesion, hemorrhage, hydrocephalus or extra-axial collection. Vascular: No hyperdense vessel. No evidence of atherosclerotic calcification. Skull: Normal.  No traumatic finding.  No focal bone lesion. Sinuses/Orbits: Visualized sinuses are clear. 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 Previous glaucoma surgery on the left. Other: None significant CT CERVICAL SPINE FINDINGS Alignment: Minimal scoliotic curvature. Skull base and vertebrae: No fracture or primary bone lesion. Soft tissues and spinal canal: No traumatic soft tissue finding. Disc levels: No significant  degenerative change. No canal or foraminal stenosis. Upper chest: Pleural and parenchymal scarring at the apices. Other: None IMPRESSION: Normal appearance of the brain.  No traumatic finding. 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 Previous glaucoma surgery on the left. No acute or traumatic cervical spine finding. Electronically Signed   By: Nelson Chimes M.D.   On: 10/02/2019 21:06   DG Chest Portable 1 View  Result Date: 10/02/2019 CLINICAL DATA:  Chest pain short of breath EXAM: PORTABLE CHEST 1 VIEW COMPARISON:  01/22/2019 FINDINGS: The heart size and mediastinal contours are within normal limits. Both lungs are clear. The visualized skeletal structures are unremarkable. IMPRESSION: No active disease. Electronically Signed   By: Donavan Foil M.D.   On: 10/02/2019 21:26    Procedures Procedures (including critical care time)  Medications Ordered in ED Medications  ondansetron (ZOFRAN-ODT) disintegrating tablet 4 mg (has no administration in time range)  sodium chloride 0.9 % bolus 1,000 mL (0 mLs Intravenous Stopped 10/02/19 2254)  ondansetron (ZOFRAN) injection 4 mg (4 mg Intravenous Given 10/02/19 2112)    ED Course  I have reviewed the triage vital signs and the nursing notes.  Pertinent labs & imaging results that were available during my care of the patient were reviewed by me and considered in my medical decision making (see chart for details).    MDM Rules/Calculators/A&P                      54 year old  male who presents for evaluation of 3 days of generalized myalgias, fatigue and vomiting, chest pain, shortness of breath.  Went today and got tested for Covid and was found to be positive.  On initial ED arrival, he is afebrile, nontoxic-appearing.  Vital signs are stable.  On exam, he has diffuse abdominal tenderness.  Lungs clear to auscultation.  He is able to speak in full sentences without any difficulty.  Plan for chest x-ray, labs.  Given syncopal episode that was unwitnessed with  positive LOC.  Plan for imaging of head and neck.  Suspect he is symptomatic from COVID-19 infection.  He has diffuse abdominal tenderness with no focal point.  No rigidity, guarding.  Do not suspect appendicitis or diverticulitis given history/physical exam.  Additionally, he has an easily reducible umbilical hernia.  Do not suspect this is incarcerated or strangulated.  Review of records show that I was unable to find his Covid test.  I discussed with patient.  He said that he was tested positive at work.  We will repeat Covid test here.  Lipase normal.  CMP shows normal BUN and creatinine.  CBC without significant leukocytosis or anemia.  Platelets are 125.  Initial troponin negative.  UA negative for any infectious etiology.  Chest x-ray negative for any acute abnormalities.  CT head and neck shows no evidence of acute bony abnormality of the neck or intracranial abnormality.  His Covid test is negative.  Patient ambulated while maintaining O2 sats of 98-100% per RN who ambulated him.  He has been off the oxygen here and has been maintaining O2 sats of 100%.  Patient will be given p.o. for p.o. challenge.  Patient able to tolerate p.o. without any difficulty.  Patient has not had any vomiting here in the ED.  I went to discuss with patient.  He is sitting on the edge of the bed stating that he is ready to go home. He does not want to stay any longer.  He took out his IV.  He reports he still has some achiness and pain everywhere but just feels sore all over.  He reports he has not had any vomiting since being here.  He states he just feels sore all over.  Repeat abdominal exam shows improved tenderness though he still has some slight generalized tenderness.  I did discuss with him regarding staying here and having further evaluation of his abdominal pain but he declined.  He states that he wants to go home and sleep.  He is alert and oriented x3 and has full medical decision-making capacity.  At  this time, he has no leukocytosis.  He has no rigidity, guarding.  No negation for CT abdomen pelvis do not suspect surgical abdomen.  He is walking without any difficulty.  Patient is requesting Zofran here in the emergency department he.  He already has a prescription for Zofran at his pharmacy.  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 At this time, patient exhibits no emergent life-threatening condition that require further evaluation in ED or admission. Patient had ample opportunity for questions and discussion. All patient's questions were answered with full understanding. Strict return precautions discussed. Patient expresses understanding and agreement to plan.   Austin [name] was evaluated in Emergency Department on 10/03/2019 for the symptoms described in the history of present illness. He was evaluated in the context of the global COVID-19 pandemic, which necessitated consideration  that the patient might be at risk for infection with the SARS-CoV-2 virus that causes COVID-19. Institutional protocols and algorithms that pertain to the evaluation of patients at risk for COVID-19 are in a state of rapid change based on information released by regulatory bodies including the CDC and federal and state organizations. These policies and algorithms were followed during the patient's care in the ED.  Portions of this note were generated with [employer]. Dictation errors may occur despite best attempts at proofreading.   Final Clinical Impression(s) / ED Diagnoses Final diagnoses:  Shortness of breath  Suspected COVID-19 virus infection  Chest pain, unspecified type  Generalized abdominal pain  Non-intractable vomiting with nausea, unspecified vomiting type    Rx / DC Orders ED Discharge Orders    None       Desma Mcgregor 10/03/19 0015    Carmin Muskrat, MD 10/03/19 [phone]

## 2019-10-02 NOTE — Discharge Instructions (Signed)
Your Covid test today was negative.  If you had a Covid positive test at your work, you will need to quarantine for 2 weeks.  Monitor your symptoms closely.  Return to the emergency department for any worsening abdominal pain, vomiting/inability eat or drink anything, chest pain, worsening difficulty breathing or any other worsening or concerning symptoms.

## 2019-10-02 NOTE — Telephone Encounter (Signed)
Canceled 5-6 colon- Sch OV with Darrell Jewel 10-25-2019 at 11 am- maield AVS to Pt-

## 2019-10-02 NOTE — ED Triage Notes (Signed)
Per EMS, patient c/o n/v/d and weakness starting Sunday 4/25. Patient tested positive today for Covid-19. Patient satting at 100% on room air. Patient endorses 1 syncopal episode today, but states he did not fall. Patient able to ambulate on his own. A&ox4. 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   20g L AC zofran 4mg  iv  250 ns bolus

## 2019-10-03 LAB — TROPONIN I (HIGH SENSITIVITY): Troponin I (High Sensitivity): <2 ng/L (ref <18)

## 2019-10-03 NOTE — ED Notes (Signed)
Patient waiting on transport home with [facility].

## 2019-10-04 ENCOUNTER — Encounter (HOSPITAL_COMMUNITY): Payer: Self-pay | Admitting: Emergency Medicine

## 2019-10-04 ENCOUNTER — Other Ambulatory Visit: Payer: Self-pay

## 2019-10-04 ENCOUNTER — Emergency Department (HOSPITAL_COMMUNITY): Payer: Medicare HMO

## 2019-10-04 ENCOUNTER — Inpatient Hospital Stay (HOSPITAL_COMMUNITY)
Admission: EM | Admit: 2019-10-04 | Discharge: 2019-10-06 | DRG: 439 | Disposition: A | Discharge status: Home / Self Care | Payer: Medicare HMO | Attending: Internal Medicine | Admitting: Internal Medicine

## 2019-10-04 DIAGNOSIS — H548 Legal blindness, as defined in USA: Secondary | ICD-10-CM | POA: Diagnosis present

## 2019-10-04 DIAGNOSIS — H543 Unqualified visual loss, both eyes: Secondary | ICD-10-CM | POA: Diagnosis present

## 2019-10-04 DIAGNOSIS — K852 Alcohol induced acute pancreatitis without necrosis or infection: Principal | ICD-10-CM | POA: Diagnosis present

## 2019-10-04 DIAGNOSIS — K860 Alcohol-induced chronic pancreatitis: Secondary | ICD-10-CM | POA: Diagnosis present

## 2019-10-04 DIAGNOSIS — Z20822 Contact with and (suspected) exposure to covid-19: Secondary | ICD-10-CM | POA: Diagnosis present

## 2019-10-04 DIAGNOSIS — K859 Acute pancreatitis without necrosis or infection, unspecified: Secondary | ICD-10-CM | POA: Diagnosis present

## 2019-10-04 DIAGNOSIS — Z7289 Other problems related to lifestyle: Secondary | ICD-10-CM

## 2019-10-04 DIAGNOSIS — I10 Essential (primary) hypertension: Secondary | ICD-10-CM | POA: Diagnosis present

## 2019-10-04 DIAGNOSIS — E860 Dehydration: Secondary | ICD-10-CM

## 2019-10-04 DIAGNOSIS — F17290 Nicotine dependence, other tobacco product, uncomplicated: Secondary | ICD-10-CM | POA: Diagnosis present

## 2019-10-04 DIAGNOSIS — F1010 Alcohol abuse, uncomplicated: Secondary | ICD-10-CM | POA: Diagnosis present

## 2019-10-04 DIAGNOSIS — Z96642 Presence of left artificial hip joint: Secondary | ICD-10-CM | POA: Diagnosis present

## 2019-10-04 DIAGNOSIS — F1290 Cannabis use, unspecified, uncomplicated: Secondary | ICD-10-CM | POA: Diagnosis present

## 2019-10-04 DIAGNOSIS — D696 Thrombocytopenia, unspecified: Secondary | ICD-10-CM

## 2019-10-04 DIAGNOSIS — Z789 Other specified health status: Secondary | ICD-10-CM

## 2019-10-04 DIAGNOSIS — K8500 Idiopathic acute pancreatitis without necrosis or infection: Secondary | ICD-10-CM

## 2019-10-04 DIAGNOSIS — D6959 Other secondary thrombocytopenia: Secondary | ICD-10-CM | POA: Diagnosis present

## 2019-10-04 DIAGNOSIS — N179 Acute kidney failure, unspecified: Secondary | ICD-10-CM | POA: Diagnosis present

## 2019-10-04 DIAGNOSIS — Z833 Family history of diabetes mellitus: Secondary | ICD-10-CM

## 2019-10-04 DIAGNOSIS — F1090 Alcohol use, unspecified, uncomplicated: Secondary | ICD-10-CM

## 2019-10-04 DIAGNOSIS — Q150 Congenital glaucoma: Secondary | ICD-10-CM

## 2019-10-04 DIAGNOSIS — Z8249 Family history of ischemic heart disease and other diseases of the circulatory system: Secondary | ICD-10-CM

## 2019-10-04 DIAGNOSIS — Z79899 Other long term (current) drug therapy: Secondary | ICD-10-CM

## 2019-10-04 DIAGNOSIS — E871 Hypo-osmolality and hyponatremia: Secondary | ICD-10-CM | POA: Diagnosis present

## 2019-10-04 DIAGNOSIS — K429 Umbilical hernia without obstruction or gangrene: Secondary | ICD-10-CM | POA: Diagnosis present

## 2019-10-04 DIAGNOSIS — Z947 Corneal transplant status: Secondary | ICD-10-CM

## 2019-10-04 DIAGNOSIS — Z9181 History of falling: Secondary | ICD-10-CM

## 2019-10-04 LAB — COMPREHENSIVE METABOLIC PANEL
ALT: 21 U/L (ref 0–44)
AST: 30 U/L (ref 15–41)
Albumin: 4.2 g/dL (ref 3.5–5.0)
Alkaline Phosphatase: 111 U/L (ref 38–126)
Anion gap: 12 (ref 5–15)
BUN: 7 mg/dL (ref 6–20)
CO2: 26 mmol/L (ref 22–32)
Calcium: 9.4 mg/dL (ref 8.9–10.3)
Chloride: 95 mmol/L — ABNORMAL LOW (ref 98–111)
Creatinine, Ser: 1.18 mg/dL (ref 0.61–1.24)
GFR calc Af Amer: >60 mL/min (ref >60)
GFR calc non Af Amer: >60 mL/min (ref >60)
Glucose, Bld: 100 mg/dL — ABNORMAL HIGH (ref 70–99)
Potassium: 3.9 mmol/L (ref 3.5–5.1)
Sodium: 133 mmol/L — ABNORMAL LOW (ref 135–145)
Total Bilirubin: 1 mg/dL (ref 0.3–1.2)
Total Protein: 7.7 g/dL (ref 6.5–8.1)

## 2019-10-04 LAB — CBC
HCT: 45.9 % (ref 39.0–52.0)
Hemoglobin: 15.7 g/dL (ref 13.0–17.0)
MCH: 31 pg (ref 26.0–34.0)
MCHC: 34.2 g/dL (ref 30.0–36.0)
MCV: 90.5 fL (ref 80.0–100.0)
Platelets: 130 10*3/uL — ABNORMAL LOW (ref 150–400)
RBC: 5.07 MIL/uL (ref 4.22–5.81)
RDW: 12.8 % (ref 11.5–15.5)
WBC: 4.1 10*3/uL (ref 4.0–10.5)
nRBC: 0 % (ref 0.0–0.2)

## 2019-10-04 LAB — LIPASE, BLOOD: Lipase: 29 U/L (ref 11–51)

## 2019-10-04 MED ORDER — PROCHLORPERAZINE EDISYLATE 10 MG/2ML IJ SOLN
10.0000 mg | Freq: Once | INTRAMUSCULAR | Status: AC
  Administered 2019-10-04: 10 mg via INTRAVENOUS
  Filled 2019-10-04: qty 2

## 2019-10-04 MED ORDER — MORPHINE SULFATE (PF) 4 MG/ML IV SOLN
4.0000 mg | Freq: Once | INTRAVENOUS | Status: AC
  Administered 2019-10-04: 4 mg via INTRAVENOUS
  Filled 2019-10-04: qty 1

## 2019-10-04 MED ORDER — SODIUM CHLORIDE 0.9 % IV BOLUS
1000.0000 mL | Freq: Once | INTRAVENOUS | Status: AC
  Administered 2019-10-04: 1000 mL via INTRAVENOUS

## 2019-10-04 MED ORDER — SODIUM CHLORIDE 0.9% FLUSH: 3.0000 mL | Freq: Once | INTRAVENOUS | Status: DC

## 2019-10-04 MED ORDER — IOHEXOL 300 MG/ML  SOLN
100.0000 mL | Freq: Once | INTRAMUSCULAR | Status: AC | PRN
  Administered 2019-10-04: 100 mL via INTRAVENOUS

## 2019-10-04 MED ORDER — ONDANSETRON HCL 4 MG/2ML IJ SOLN
4.0000 mg | Freq: Once | INTRAMUSCULAR | Status: AC
  Administered 2019-10-04: 21:00:00 4 mg via INTRAVENOUS
  Filled 2019-10-04: qty 2

## 2019-10-04 NOTE — ED Provider Notes (Signed)
[facility] EMERGENCY DEPARTMENT Provider Note   CSN: [id number] Arrival date & time: 10/04/19  1648   History Chief Complaint  Patient presents with  . Abdominal Pain    Austin [name] is a 54 y.o. male.  The history is provided by the patient.  Abdominal Pain He has history of hypertension, alcohol abuse, pancreatitis and comes in with right-sided abdominal pain over the last 4 days.  He started off with what he said was a mild stomachache and then started vomiting 3 days ago.  Pain has gotten progressively worse and now extends pretty much from the right lower chest down to his waist on the right side.  There is associated nausea and vomiting.  He has not had a bowel movement in the last 2 days and is passed only a small amount of flatus.  He denies fever, chills, sweats.  He does admit to having had several drinks the day before symptoms started.  He was seen in the emergency department at [facility] 2 days ago and had a test for Covid which was negative.  Past Medical History:  Diagnosis Date  . Alcohol abuse   . Anxiety   . Arthritis   . 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 Elevated LFTs   . Foot and toe(s), blister    bilat feet, blistering between toes  . GERD (gastroesophageal reflux disease)   . Glaucoma   . Glaucoma as birth trauma   . Headache(784.0)   . Hypertension    hx of  . Insomnia   . Pancreatitis   . UTI (lower urinary tract infection) 08/04/11   finishing abx tomorrow  . Weight loss    25 lb wt loss since 08/2010  . Wrist fracture, right     Patient Active Problem List   Diagnosis Date Noted  . 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 Intractable nausea and vomiting 12/29/2018  . 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 Essential hypertension 04/14/2018  . Blindness of both eyes 04/14/2018  . Alcohol use 04/14/2018  . Hyponatremia 04/14/2018  . RUQ pain   . Abnormal CT of the abdomen   .  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 COLONOSCOPY  2006?  . ESOPHAGOGASTRODUODENOSCOPY    . ESOPHAGOGASTRODUODENOSCOPY (EGD) WITH PROPOFOL N/A 05/09/2017   Procedure: ESOPHAGOGASTRODUODENOSCOPY (EGD) WITH PROPOFOL;  Surgeon: Doran Stabler, MD;  Location: [facility];  Service: Gastroenterology;  Laterality: N/A;  . EYE SURGERY Left   . EYE SURGERY Right    prostetic eye  . foot srugery     removal of bone near small toe  . FRACTURE SURGERY Right    wrist  . HARDWARE REMOVAL  08/06/2011   Procedure: HARDWARE REMOVAL;  Surgeon: Newt Minion, MD;  Location: [facility];  Service: Orthopedics;  Laterality: Right;  Removal Deep Retained Hardware Right Distal Radius  . MULTIPLE TOOTH EXTRACTIONS     for dentures  . TOTAL HIP ARTHROPLASTY Left 06/06/2019   Procedure: LEFT TOTAL HIP ARTHROPLASTY-DIRECT ANTERIOR;  Surgeon: [name], MD;  Location: [facility];  Service: Orthopedics;  Laterality: Left;       Family History  Problem Relation Age of Onset  . Diabetes Sister   . Diabetes Maternal Aunt        aunt and  uncles  . Hypertension Sister        aunt and uncles maternal side  . Colon cancer Neg Hx   . Colon polyps Neg Hx   . Esophageal cancer Neg Hx   . Rectal cancer Neg Hx   . Stomach cancer Neg Hx     Social History   Tobacco Use  . Smoking status: Current Every Day Smoker    Packs/day: 0.25    Years: 17.00    Pack years: 4.25    Types: Cigars  . Smokeless tobacco: Never Used  . Tobacco comment: 3 cigars a day   Substance Use Topics  . Alcohol use: Yes    Comment: 1 pint a day of liquor per pt  . Drug use: Yes    Frequency: 7.0 times per week    Types: Marijuana    Comment: smokes marijuana 1 to 2 times daily    Home Medications Prior to Admission medications   Medication Sig Start Date End Date Taking? Authorizing Provider  folic acid  (FOLVITE) 1 MG tablet Take 1 mg by mouth daily. 10/01/19   [provider]  Na Sulfate-K Sulfate-Mg Sulf 17.5-3.13-1.6 GM/177ML SOLN Suprep (no substitutions)-TAKE AS DIRECTED. Patient not taking: Reported on 10/02/2019 09/28/19   Doran Stabler, MD  ondansetron (ZOFRAN) 4 MG tablet Take 4 mg by mouth every 8 (eight) hours as needed for nausea or vomiting.  10/02/19   [provider]  ramelteon (ROZEREM) 8 MG tablet Take 8 mg by mouth at bedtime. 10/02/19   [provider]  traZODone (DESYREL) 50 MG tablet Take 50 mg by mouth at bedtime. 10/01/19   [provider]  VITAMIN D PO Take by mouth.    [provider]  zinc gluconate 50 MG tablet Take 50 mg by mouth daily. 09/10/19   [provider]    Allergies    Patient has no known allergies.  Review of Systems   Review of Systems  Gastrointestinal: Positive for abdominal pain.  All other systems reviewed and are negative.   Physical Exam Updated Vital Signs BP (!) 155/85 (BP Location: Right Arm)   Pulse (!) 2   Temp 99.3 F (37.4 C) (Oral)   Resp 18   SpO2 100%   Physical Exam Vitals and nursing note reviewed.   54 year old male, resting comfortably and in no acute distress. Vital signs are significant for elevated blood pressure. Oxygen saturation is 100%, which is normal. Head is normocephalic and atraumatic. PERRLA, EOMI. Oropharynx is clear. Neck is nontender and supple without adenopathy or JVD. Back is nontender and there is no CVA tenderness. Lungs are clear without rales, wheezes, or rhonchi. Chest is nontender. Heart has regular rate and rhythm without murmur. Abdomen is soft, flat, with moderate to severe right mid and upper abdominal tenderness with voluntary guarding.  There is no rebound tenderness.  There are no masses or hepatosplenomegaly and peristalsis is hypoactive. Extremities have no cyanosis or edema, full range of motion is present. Skin is warm and dry  without rash. Neurologic: Mental status is normal, cranial nerves are intact, there are no motor or sensory deficits.  ED Results / Procedures / Treatments   Labs (all labs ordered are listed, but only abnormal results are displayed) Labs Reviewed  COMPREHENSIVE METABOLIC PANEL - Abnormal; Notable for the following components:      Result Value   Sodium 133 (*)    Chloride 95 (*)    Glucose, Bld  100 (*)    All other components within normal limits  CBC - Abnormal; Notable for the following components:   Platelets 130 (*)    All other components within normal limits  LIPASE, BLOOD  URINALYSIS, ROUTINE W REFLEX MICROSCOPIC   Radiology DG Chest Portable 1 View  Result Date: 10/02/2019 CLINICAL DATA:  Chest pain short of breath EXAM: PORTABLE CHEST 1 VIEW COMPARISON:  01/22/2019 FINDINGS: The heart size and mediastinal contours are within normal limits. Both lungs are clear. The visualized skeletal structures are unremarkable. IMPRESSION: No active disease. Electronically Signed   By: Donavan Foil M.D.   On: 10/02/2019 21:26    Procedures Procedures   Medications Ordered in ED Medications  sodium chloride flush (NS) 0.9 % injection 3 mL (3 mLs Intravenous Not Given 10/04/19 2105)  ondansetron (ZOFRAN) injection 4 mg (has no administration in time range)  morphine 4 MG/ML injection 4 mg (has no administration in time range)  sodium chloride 0.9 % bolus 1,000 mL (has no administration in time range)    ED Course  I have reviewed the triage vital signs and the nursing notes.  Pertinent labs & imaging results that were available during my care of the patient were reviewed by me and considered in my medical decision making (see chart for details).  MDM Rules/Calculators/A&P Right upper quadrant abdominal pain.  Lipase is normal making pancreatitis less likely.  Consider diverticulitis, cholecystitis, peptic ulcer disease.  WBC is also noted to be normal.  Mild thrombocytopenia is  present which is unchanged from baseline.  Mild hyponatremia is present and not felt to be clinically significant.  Transaminases are normal.  Will send for CT of abdomen and pelvis.  Old records are reviewed, and ED visit 2 days ago was for dyspnea, but lipase was noted to be normal at that visit as well.  He does have prior ED visits for abdominal pain and vomiting with chronic pancreatitis.  CT scan is consistent with mild pancreatitis.  He had slight relief of pain with morphine, but no relief of nausea with ondansetron.  He will be given additional morphine trial of prochlorperazine.  Case is signed out to Dr. Randal Buba.  Final Clinical Impression(s) / ED Diagnoses Final diagnoses:  Idiopathic acute pancreatitis without infection or necrosis  Thrombocytopenia ([place])    Rx / DC Orders ED Discharge Orders    None       Delora Fuel, MD [id number]

## 2019-10-04 NOTE — ED Triage Notes (Signed)
C/o RUQ pain that radiates to R flank, headache, nausea, vomiting, and diarrhea since Sunday.  States he had syncopal episode on Tuesday and came to hospital because he thought he had [place].  States COVID test was negative but pt states he wasn't evaluated for the abd pain.

## 2019-10-04 NOTE — ED Notes (Signed)
Pt vomiting yellowish secretion, pt cleaned and changed on a clean hospital gown, Zofran, Morphine and fluids given as ordered. Pt updated that we are waiting for a CT of his abd. Call bell at reach, pt resting comfortable on bed.

## 2019-10-05 ENCOUNTER — Encounter (HOSPITAL_COMMUNITY): Payer: Self-pay | Admitting: Emergency Medicine

## 2019-10-05 DIAGNOSIS — K852 Alcohol induced acute pancreatitis without necrosis or infection: Principal | ICD-10-CM

## 2019-10-05 DIAGNOSIS — I10 Essential (primary) hypertension: Secondary | ICD-10-CM | POA: Diagnosis present

## 2019-10-05 DIAGNOSIS — E871 Hypo-osmolality and hyponatremia: Secondary | ICD-10-CM | POA: Diagnosis present

## 2019-10-05 DIAGNOSIS — Z8249 Family history of ischemic heart disease and other diseases of the circulatory system: Secondary | ICD-10-CM | POA: Diagnosis not present

## 2019-10-05 DIAGNOSIS — Z96642 Presence of left artificial hip joint: Secondary | ICD-10-CM | POA: Diagnosis present

## 2019-10-05 DIAGNOSIS — H543 Unqualified visual loss, both eyes: Secondary | ICD-10-CM

## 2019-10-05 DIAGNOSIS — R112 Nausea with vomiting, unspecified: Secondary | ICD-10-CM | POA: Diagnosis present

## 2019-10-05 DIAGNOSIS — Z947 Corneal transplant status: Secondary | ICD-10-CM | POA: Diagnosis not present

## 2019-10-05 DIAGNOSIS — Z7289 Other problems related to lifestyle: Secondary | ICD-10-CM

## 2019-10-05 DIAGNOSIS — N179 Acute kidney failure, unspecified: Secondary | ICD-10-CM | POA: Diagnosis present

## 2019-10-05 DIAGNOSIS — Z79899 Other long term (current) drug therapy: Secondary | ICD-10-CM | POA: Diagnosis not present

## 2019-10-05 DIAGNOSIS — Z20822 Contact with and (suspected) exposure to covid-19: Secondary | ICD-10-CM | POA: Diagnosis present

## 2019-10-05 DIAGNOSIS — K859 Acute pancreatitis without necrosis or infection, unspecified: Secondary | ICD-10-CM | POA: Diagnosis present

## 2019-10-05 DIAGNOSIS — D6959 Other secondary thrombocytopenia: Secondary | ICD-10-CM | POA: Diagnosis present

## 2019-10-05 DIAGNOSIS — F17290 Nicotine dependence, other tobacco product, uncomplicated: Secondary | ICD-10-CM | POA: Diagnosis present

## 2019-10-05 DIAGNOSIS — Z9181 History of falling: Secondary | ICD-10-CM | POA: Diagnosis not present

## 2019-10-05 DIAGNOSIS — Z833 Family history of diabetes mellitus: Secondary | ICD-10-CM | POA: Diagnosis not present

## 2019-10-05 DIAGNOSIS — H548 Legal blindness, as defined in USA: Secondary | ICD-10-CM | POA: Diagnosis present

## 2019-10-05 DIAGNOSIS — K860 Alcohol-induced chronic pancreatitis: Secondary | ICD-10-CM | POA: Diagnosis present

## 2019-10-05 DIAGNOSIS — K429 Umbilical hernia without obstruction or gangrene: Secondary | ICD-10-CM | POA: Diagnosis present

## 2019-10-05 DIAGNOSIS — F1010 Alcohol abuse, uncomplicated: Secondary | ICD-10-CM | POA: Diagnosis present

## 2019-10-05 DIAGNOSIS — F1290 Cannabis use, unspecified, uncomplicated: Secondary | ICD-10-CM | POA: Diagnosis present

## 2019-10-05 DIAGNOSIS — Q150 Congenital glaucoma: Secondary | ICD-10-CM | POA: Diagnosis not present

## 2019-10-05 LAB — CBC WITH DIFFERENTIAL/PLATELET
Abs Immature Granulocytes: 0 10*3/uL (ref 0.00–0.07)
Basophils Absolute: 0 10*3/uL (ref 0.0–0.1)
Basophils Relative: 1 %
Eosinophils Absolute: 0.1 10*3/uL (ref 0.0–0.5)
Eosinophils Relative: 3 %
HCT: 42.7 % (ref 39.0–52.0)
Hemoglobin: 14.3 g/dL (ref 13.0–17.0)
Immature Granulocytes: 0 %
Lymphocytes Relative: 44 %
Lymphs Abs: 1.8 10*3/uL (ref 0.7–4.0)
MCH: 31.2 pg (ref 26.0–34.0)
MCHC: 33.5 g/dL (ref 30.0–36.0)
MCV: 93.2 fL (ref 80.0–100.0)
Monocytes Absolute: 0.6 10*3/uL (ref 0.1–1.0)
Monocytes Relative: 14 %
Neutro Abs: 1.5 10*3/uL — ABNORMAL LOW (ref 1.7–7.7)
Neutrophils Relative %: 38 %
Platelets: 116 10*3/uL — ABNORMAL LOW (ref 150–400)
RBC: 4.58 MIL/uL (ref 4.22–5.81)
RDW: 12.9 % (ref 11.5–15.5)
WBC: 4 10*3/uL (ref 4.0–10.5)
nRBC: 0 % (ref 0.0–0.2)

## 2019-10-05 LAB — HEPATIC FUNCTION PANEL
ALT: 18 U/L (ref 0–44)
AST: 23 U/L (ref 15–41)
Albumin: 3.5 g/dL (ref 3.5–5.0)
Alkaline Phosphatase: 89 U/L (ref 38–126)
Bilirubin, Direct: 0.3 mg/dL — ABNORMAL HIGH (ref 0.0–0.2)
Indirect Bilirubin: 0.6 mg/dL (ref 0.3–0.9)
Total Bilirubin: 0.9 mg/dL (ref 0.3–1.2)
Total Protein: 6.3 g/dL — ABNORMAL LOW (ref 6.5–8.1)

## 2019-10-05 LAB — URINALYSIS, ROUTINE W REFLEX MICROSCOPIC
Bilirubin Urine: NEGATIVE
Glucose, UA: NEGATIVE mg/dL
Ketones, ur: 80 mg/dL — AB
Leukocytes,Ua: NEGATIVE
Nitrite: NEGATIVE
Protein, ur: 30 mg/dL — AB
Specific Gravity, Urine: >1.046 — ABNORMAL HIGH (ref 1.005–1.030)
pH: 5 (ref 5.0–8.0)

## 2019-10-05 LAB — CBC
HCT: 42.6 % (ref 39.0–52.0)
Hemoglobin: 14.5 g/dL (ref 13.0–17.0)
MCH: 31 pg (ref 26.0–34.0)
MCHC: 34 g/dL (ref 30.0–36.0)
MCV: 91 fL (ref 80.0–100.0)
Platelets: 124 10*3/uL — ABNORMAL LOW (ref 150–400)
RBC: 4.68 MIL/uL (ref 4.22–5.81)
RDW: 12.8 % (ref 11.5–15.5)
WBC: 4.2 10*3/uL (ref 4.0–10.5)
nRBC: 0 % (ref 0.0–0.2)

## 2019-10-05 LAB — CREATININE, SERUM
Creatinine, Ser: 1.06 mg/dL (ref 0.61–1.24)
GFR calc Af Amer: >60 mL/min (ref >60)
GFR calc non Af Amer: >60 mL/min (ref >60)

## 2019-10-05 LAB — BASIC METABOLIC PANEL
Anion gap: 11 (ref 5–15)
BUN: 7 mg/dL (ref 6–20)
CO2: 23 mmol/L (ref 22–32)
Calcium: 8.7 mg/dL — ABNORMAL LOW (ref 8.9–10.3)
Chloride: 101 mmol/L (ref 98–111)
Creatinine, Ser: 1.01 mg/dL (ref 0.61–1.24)
GFR calc Af Amer: >60 mL/min (ref >60)
GFR calc non Af Amer: >60 mL/min (ref >60)
Glucose, Bld: 88 mg/dL (ref 70–99)
Potassium: 4.1 mmol/L (ref 3.5–5.1)
Sodium: 135 mmol/L (ref 135–145)

## 2019-10-05 LAB — GLUCOSE, CAPILLARY
Glucose-Capillary: 114 mg/dL — ABNORMAL HIGH (ref 70–99)
Glucose-Capillary: 151 mg/dL — ABNORMAL HIGH (ref 70–99)

## 2019-10-05 LAB — RESPIRATORY PANEL BY RT PCR (FLU A&B, COVID)
Influenza A by PCR: NEGATIVE
Influenza B by PCR: NEGATIVE
SARS Coronavirus 2 by RT PCR: NEGATIVE

## 2019-10-05 LAB — CBG MONITORING, ED
Glucose-Capillary: 113 mg/dL — ABNORMAL HIGH (ref 70–99)
Glucose-Capillary: 92 mg/dL (ref 70–99)

## 2019-10-05 LAB — TROPONIN I (HIGH SENSITIVITY): Troponin I (High Sensitivity): 4 ng/L (ref <18)

## 2019-10-05 MED ORDER — FOLIC ACID 1 MG PO TABS
1.0000 mg | ORAL_TABLET | Freq: Every day | ORAL | Status: DC
  Administered 2019-10-05 – 2019-10-06 (×2): 1 mg via ORAL
  Filled 2019-10-05 (×2): qty 1

## 2019-10-05 MED ORDER — ADULT MULTIVITAMIN W/MINERALS CH
1.0000 | ORAL_TABLET | Freq: Every day | ORAL | Status: DC
  Administered 2019-10-05 – 2019-10-06 (×2): 1 via ORAL
  Filled 2019-10-05 (×2): qty 1

## 2019-10-05 MED ORDER — HYDRALAZINE HCL 20 MG/ML IJ SOLN
10.0000 mg | INTRAMUSCULAR | Status: DC | PRN
  Administered 2019-10-05: 10 mg via INTRAVENOUS
  Filled 2019-10-05: qty 1

## 2019-10-05 MED ORDER — ONDANSETRON HCL 4 MG PO TABS: 4.0000 mg | ORAL_TABLET | Freq: Four times a day (QID) | ORAL | Status: DC | PRN

## 2019-10-05 MED ORDER — ENOXAPARIN SODIUM 40 MG/0.4ML ~~LOC~~ SOLN
40.0000 mg | SUBCUTANEOUS | Status: DC
  Administered 2019-10-05 – 2019-10-06 (×2): 40 mg via SUBCUTANEOUS
  Filled 2019-10-05 (×2): qty 0.4

## 2019-10-05 MED ORDER — FENTANYL CITRATE (PF) 100 MCG/2ML IJ SOLN: 25.0000 ug | INTRAMUSCULAR | Status: DC | PRN

## 2019-10-05 MED ORDER — THIAMINE HCL 100 MG PO TABS
100.0000 mg | ORAL_TABLET | Freq: Every day | ORAL | Status: DC
  Administered 2019-10-05 – 2019-10-06 (×2): 100 mg via ORAL
  Filled 2019-10-05 (×2): qty 1

## 2019-10-05 MED ORDER — LORAZEPAM 2 MG/ML IJ SOLN: 1.0000 mg | INTRAMUSCULAR | Status: DC | PRN

## 2019-10-05 MED ORDER — LORAZEPAM 1 MG PO TABS: 1.0000 mg | ORAL_TABLET | ORAL | Status: DC | PRN

## 2019-10-05 MED ORDER — THIAMINE HCL 100 MG/ML IJ SOLN: 100.0000 mg | Freq: Every day | INTRAMUSCULAR | Status: DC

## 2019-10-05 MED ORDER — MORPHINE SULFATE (PF) 2 MG/ML IV SOLN
2.0000 mg | Freq: Once | INTRAVENOUS | Status: AC
  Administered 2019-10-05: 2 mg via INTRAVENOUS
  Filled 2019-10-05: qty 1

## 2019-10-05 MED ORDER — LACTATED RINGERS IV SOLN: INTRAVENOUS | Status: AC

## 2019-10-05 MED ORDER — HYDROCODONE-ACETAMINOPHEN 5-325 MG PO TABS
1.0000 | ORAL_TABLET | Freq: Four times a day (QID) | ORAL | Status: DC | PRN
  Administered 2019-10-05 (×3): 1 via ORAL
  Filled 2019-10-05 (×3): qty 1

## 2019-10-05 MED ORDER — ONDANSETRON HCL 4 MG/2ML IJ SOLN: 4.0000 mg | Freq: Four times a day (QID) | INTRAMUSCULAR | Status: DC | PRN

## 2019-10-05 MED ORDER — SODIUM CHLORIDE 0.9 % IV SOLN: INTRAVENOUS | Status: DC

## 2019-10-05 NOTE — ED Notes (Signed)
PO challenge started

## 2019-10-05 NOTE — ED Notes (Signed)
Pt kept sprite down, no vomiting.

## 2019-10-05 NOTE — Progress Notes (Signed)
PROGRESS NOTE    Austin [name]  [id number] DOB: 25-Sep-1965 DOA: 10/04/2019 PCP: Buzzy Han, MD   Brief Narrative:  Austin [name] is a 54 y.o. 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  Patient states his pain started about 5 days ago at that time patient had nausea vomiting and diarrhea.  But diarrhea subsided after 1 day.  Pain is mostly in the right upper quadrant radiating to the back.  Denies any chest pain no fever chills.  Admits to drinking alcohol every day.  Last drink was around 4 days ago. In the ER CT abdomen pelvis shows features concerning for acute pancreatitis.  Labs were largely unremarkable with normal lipase and LFTs.  Platelets show count of 130.  Covid test was negative.  EKG shows normal sinus rhythm.  Patient started on IV fluids admitted for acute pancreatitis.   Assessment & Plan:   Principal Problem:   Acute pancreatitis Active Problems:   Alcoholic pancreatitis   Essential hypertension   Blindness of both eyes   Alcohol use  Acute on chronic abdominal pain, rule out pancreatitis versus hyperemesis in the setting of marijuana use -CT reviewed, remarkable for scant fluid around pancreas concerning for pancreatitis on imaging however patient's lipase is within normal limits -Patient's main complaint is right upper quadrant pain, or if this is in relation to eating, situational, positional or in relation to marijuana use as below. -Reports smoking essentially daily with marijuana, we discussed hyperemesis is a rare but known side effect of chronic and recurrent use of marijuana.  We discussed stopping use for at least 2 weeks before restarting to see if patient's abdominal discomfort correlates or is unrelated -At this time will advance diet as tolerated,  continue IV fluids; discontinue IV narcotic pain medications -transition to p.o. Lortab given patient symptoms are mild to moderate at this point but main complaint is poor p.o. intake due to nausea and vomiting  AKI without history of CKD -Likely in the setting of poor p.o. intake as above, continue IV fluids until p.o. increases adequately  Hypertension, essential, uncontrolled secondary to above -Secondary to above, pain currently well controlled on p.o. Lortab, continue IV fluids and symptom control for nausea and vomiting which is patient's main complaint today  Alcohol use/abuse -Continue CIWA protocol -last drink 4 days ago, patient is currently at moderately high risk for withdrawals at this time -IV fluids and advance diet as tolerated. 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 Code Status: Full code. Family Communication: Discussed with patient. Disposition Plan: Home pending resolution of intractable nausea vomiting abdominal pain inability to tolerate p.o. safely and resolution of AKI Consults called: None. Admission status: Inpatient while requiring IV fluids, narcotics, close monitoring in the setting of poor  p.o. intake   Subjective: No acute issues or events overnight, patient indicates his pain is well resolved at this point but ongoing nausea without any recent vomiting over the past few hours.   Otherwise declines chest pain, shortness of breath, headache, fevers, chills.  Objective: Vitals:   10/05/19 0515 10/05/19 0600 10/05/19 0615 10/05/19 0630  BP: (!) 181/113  (!) 165/89 (!) 157/93  Pulse: 72 (!) 58 60 61  Resp: 16 16 13 15   Temp:      TempSrc:      SpO2: 98% 99% 97% 98%  Weight:      Height:        Intake/Output Summary (Last 24 hours) at 10/05/2019 0725 Last data filed at 10/05/2019 0403 Gross per 24 hour  Intake 2000 ml  Output --  Net 2000 ml   Filed Weights   10/04/19 2128  Weight: 65.8 kg    Examination:  General:  Pleasantly resting in bed, No acute distress. HEENT:  Normocephalic atraumatic.  Sclerae nonicteric, noninjected.  With cloudy/opaque lenses. Neck:  Without mass or deformity.  Trachea is midline. Lungs:  Clear to auscultate bilaterally without rhonchi, wheeze, or rales. Heart:  Regular rate and rhythm.  Without murmurs, rubs, or gallops. Abdomen:  Soft, nontender, nondistended.  Without guarding or rebound. Extremities: Without cyanosis, clubbing, edema, or obvious deformity. Vascular:  Dorsalis pedis and posterior tibial pulses palpable bilaterally. Skin:  Warm and dry, no erythema, no ulcerations.    Data Reviewed: I have personally reviewed following labs and imaging studies  CBC: Recent Labs  Lab 10/02/19 2110 10/04/19 1733 10/05/19 0509  WBC 5.8 4.1 4.2  NEUTROABS 3.6  --   --   HGB 14.6 15.7 14.5  HCT 42.4 45.9 42.6  MCV 92.0 90.5 91.0  PLT 125* 130* [id number]*   Basic Metabolic Panel: Recent Labs  Lab 10/02/19 2110 10/04/19 1733 10/05/19 0509  NA 133* 133*  --   K 4.1 3.9  --   CL 98 95*  --   CO2 26 26  --   GLUCOSE 111* 100*  --   BUN 7 7  --   CREATININE 0.95 1.18 1.06  CALCIUM 8.7* 9.4  --    GFR: Estimated Creatinine Clearance: 74.1 mL/min (by C-G formula based on SCr of 1.06 mg/dL). Liver Function Tests: Recent Labs  Lab 10/02/19 2110 10/04/19 1733  AST 29 30  ALT 18 21  ALKPHOS 107 111  BILITOT 0.9  1.0  PROT 7.4 7.7  ALBUMIN 3.9 4.2   Recent Labs  Lab 10/02/19 2110 10/04/19 1733  LIPASE 34 29   No results for input(s): AMMONIA in the last 168 hours. Coagulation Profile: No results for input(s): INR, PROTIME in the last 168 hours. Cardiac Enzymes: No results for input(s): CKTOTAL, CKMB, CKMBINDEX, TROPONINI in the last 168 hours. BNP (last 3 results) No results for input(s): PROBNP in the last 8760 hours. HbA1C: No results for input(s): HGBA1C in the last 72 hours. CBG: Recent Labs  Lab 10/05/19 0355  GLUCAP 113*   Lipid Profile: No results for input(s): CHOL, HDL, LDLCALC, TRIG, CHOLHDL, LDLDIRECT in the last 72 hours. Thyroid Function Tests: No results for input(s): TSH, T4TOTAL, FREET4, T3FREE, THYROIDAB in the last 72 hours. Anemia Panel: No results for input(s): VITAMINB12, FOLATE, FERRITIN, TIBC, IRON, RETICCTPCT in the last 72 hours. Sepsis Labs: No results for input(s): PROCALCITON, LATICACIDVEN in the last 168 hours.  Recent Results (from the past 240 hour(s))  Respiratory Panel by RT  PCR (Flu A&B, Covid) - Nasopharyngeal Swab     Status: None   Collection Time: 10/05/19  2:51 AM   Specimen: Nasopharyngeal Swab  Result Value Ref Range Status   SARS Coronavirus 2 by RT PCR NEGATIVE NEGATIVE Final    Comment: (NOTE) SARS-CoV-2 target nucleic acids are NOT DETECTED. The SARS-CoV-2 RNA is generally detectable in upper respiratoy specimens during the acute phase of infection. The lowest concentration of SARS-CoV-2 viral copies this assay can detect is 131 copies/mL. A negative result does not preclude SARS-Cov-2 infection and should not be used as the sole basis for treatment or other patient management decisions. A negative result may occur with  improper specimen collection/handling, submission of specimen other than nasopharyngeal swab, presence of viral mutation(s) within the areas targeted by this assay, and inadequate number of viral copies (<131  copies/mL). A negative result must be combined with clinical observations, patient history, and epidemiological information. The expected result is Negative. Fact Sheet for Patients:  PinkCheek.be Fact Sheet for Healthcare Providers:  GravelBags.it This test is not yet ap proved or cleared by the [place] FDA and  has been authorized for detection and/or diagnosis of SARS-CoV-2 by FDA under an Emergency Use Authorization (EUA). This EUA will remain  in effect (meaning this test can be used) for the duration of the COVID-19 declaration under Section 564(b)(1) of the Act, 21 U.S.C. section 360bbb-3(b)(1), unless the authorization is terminated or revoked sooner.    Influenza A by PCR NEGATIVE NEGATIVE Final   Influenza B by PCR NEGATIVE NEGATIVE Final    Comment: (NOTE) The Xpert Xpress SARS-CoV-2/FLU/RSV assay is intended as an aid in  the diagnosis of influenza from Nasopharyngeal swab specimens and  should not be used as a sole basis for treatment. Nasal washings and  aspirates are unacceptable for Xpert Xpress SARS-CoV-2/FLU/RSV  testing. Fact Sheet for Patients: PinkCheek.be Fact Sheet for Healthcare Providers: GravelBags.it This test is not yet approved or cleared by the [place] FDA and  has been authorized for detection and/or diagnosis of SARS-CoV-2 by  FDA under an Emergency Use Authorization (EUA). This EUA will remain  in effect (meaning this test can be used) for the duration of the  Covid-19 declaration under Section 564(b)(1) of the Act, 21  U.S.C. section 360bbb-3(b)(1), unless the authorization is  terminated or revoked. Performed at [facility], [place] [address] [place], [place] [address]          Radiology Studies: CT ABDOMEN PELVIS W CONTRAST  Result Date: 10/04/2019 CLINICAL DATA:  Right upper quadrant pain with nausea,  vomiting and diarrhea. EXAM: CT ABDOMEN AND PELVIS WITH CONTRAST TECHNIQUE: Multidetector CT imaging of the abdomen and pelvis was performed using the standard protocol following bolus administration of intravenous contrast. CONTRAST:  130mL OMNIPAQUE IOHEXOL 300 MG/ML  SOLN COMPARISON:  December 30, 2018 FINDINGS: Lower chest: Very mild atelectasis is seen within the right lung base. Hepatobiliary: No focal liver abnormality is seen. No gallstones, gallbladder wall thickening, or biliary dilatation. Pancreas: A trace amount of peripancreatic inflammatory fat stranding is seen anterior and inferior to the head of the pancreas. Spleen: Normal in size without focal abnormality. Adrenals/Urinary Tract: Adrenal glands are unremarkable. Kidneys are normal, without renal calculi, focal lesion, or hydronephrosis. The urinary bladder is limited in evaluation secondary to overlying streak artifact and otherwise appears normal. Stomach/Bowel: Stomach is within normal limits. The appendix is not identified. No evidence of bowel wall dilatation. Mild symmetric thickening of the duodenal  bulb is seen (axial CT images 28 through 34, CT series number 3). Vascular/Lymphatic: Moderate severity aortic calcification. No enlarged abdominal or pelvic lymph nodes. Reproductive: Prostate gland is mildly enlarged. Other: Prominence of the umbilicus is seen without evidence of associated hernia. No abdominopelvic ascites. Musculoskeletal: A total left hip replacement is seen with associated streak artifact and subsequently limited evaluation of the adjacent osseous and soft tissue structures. IMPRESSION: 1. Trace amount of peripancreatic inflammatory fat stranding which may represent sequelae associated with mild acute pancreatitis. Correlation with pancreatic enzymes is recommended. 2. Mild symmetric thickening of the duodenal bulb which may be secondary to poor distension. 3. Total left hip replacement. Aortic Atherosclerosis (ICD10-I70.0).  Electronically Signed   By: Virgina Norfolk M.D.   On: 10/04/2019 22:15        Scheduled Meds: . enoxaparin (LOVENOX) injection  40 mg Subcutaneous Q24H  . folic acid  1 mg Oral Daily  . multivitamin with minerals  1 tablet Oral Daily  . sodium chloride flush  3 mL Intravenous Once  . thiamine  100 mg Oral Daily   Or  . thiamine  100 mg Intravenous Daily   Continuous Infusions: . lactated ringers      LOS: 0 days   Time spent: 65min  Lochlan Grygiel C Joandry Slagter, DO [facility]  If 7PM-7AM, please contact night-coverage [email or web address]  10/05/2019, 7:25 AM

## 2019-10-05 NOTE — H&P (Signed)
History and Physical    Austin [name] [id number] DOB: 07-16-1965 DOA: 10/04/2019  PCP: Buzzy Han, MD  Patient coming from: Home.  Chief Complaint: Abdominal pain with nausea vomiting.  HPI: Austin [name] is a 54 y.o. male with history of chronic alcohol abuse with previous history of pancreatitis, hypertension legally blind presents to the ER for the second time in 5 days with complaints of abdominal pain nausea vomiting.  Patient states his pain started about 5 days ago at that time patient had nausea vomiting and diarrhea.  But diarrhea subsided after 1 day.  Pain is mostly in the right upper quadrant radiating to the back.  Denies any chest pain no fever chills.  Admits to drinking alcohol every day.  Last drink was around 4 days ago.  ED Course: In the ER CT abdomen pelvis shows features concerning for acute pancreatitis.  Labs were largely unremarkable with normal lipase and LFTs.  Platelets show count of 130.  Covid test was negative.  EKG shows normal sinus rhythm.  Patient started on IV fluids admitted for acute pancreatitis.  Review of Systems: As per HPI, rest all negative.   Past Medical History:  Diagnosis Date  . Alcohol abuse   . Anxiety   . Arthritis   . 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 Elevated LFTs   . Foot and toe(s), blister    bilat feet, blistering between toes  . GERD (gastroesophageal reflux disease)   . Glaucoma   . Glaucoma as birth trauma   . Headache(784.0)   . Hypertension    hx of  . Insomnia   . Pancreatitis   . UTI (lower urinary tract infection) 08/04/11   finishing abx tomorrow  . Weight loss    25 lb wt loss since 08/2010  . Wrist fracture, right     Past Surgical History:  Procedure Laterality Date  . COLONOSCOPY  2006?  . ESOPHAGOGASTRODUODENOSCOPY    . ESOPHAGOGASTRODUODENOSCOPY (EGD) WITH PROPOFOL N/A 05/09/2017   Procedure: ESOPHAGOGASTRODUODENOSCOPY (EGD) WITH PROPOFOL;  Surgeon: Doran Stabler,  MD;  Location: [facility];  Service: Gastroenterology;  Laterality: N/A;  . EYE SURGERY Left   . EYE SURGERY Right    prostetic eye  . foot srugery     removal of bone near small toe  . FRACTURE SURGERY Right    wrist  . HARDWARE REMOVAL  08/06/2011   Procedure: HARDWARE REMOVAL;  Surgeon: Newt Minion, MD;  Location: [place];  Service: Orthopedics;  Laterality: Right;  Removal Deep Retained Hardware Right Distal Radius  . MULTIPLE TOOTH EXTRACTIONS     for dentures  . TOTAL HIP ARTHROPLASTY Left 06/06/2019   Procedure: LEFT TOTAL HIP ARTHROPLASTY-DIRECT ANTERIOR;  Surgeon: [name], MD;  Location: [place];  Service: Orthopedics;  Laterality: Left;     reports that he has been smoking cigars. He has a 4.25 pack-year smoking history. He has never used smokeless tobacco. He reports current alcohol use. He reports current drug use. Frequency: 7.00 times per week. Drug: Marijuana.  No Known Allergies  Family History  Problem Relation Age of Onset  . Diabetes Sister   . Diabetes Maternal Aunt        aunt and uncles  . Hypertension Sister        aunt and uncles maternal side  . Colon cancer Neg Hx   . Colon polyps Neg Hx   . Esophageal cancer Neg  Hx   . Rectal cancer Neg Hx   . Stomach cancer Neg Hx     Prior to Admission medications   Medication Sig Start Date End Date Taking? Authorizing Provider  folic acid (FOLVITE) 1 MG tablet Take 1 mg by mouth daily. 10/01/19  Yes [provider]  ondansetron (ZOFRAN) 4 MG tablet Take 4 mg by mouth every 8 (eight) hours as needed for nausea or vomiting.  10/02/19  Yes [provider]  VITAMIN D PO Take 1 tablet by mouth daily.    Yes [provider]  zinc gluconate 50 MG tablet Take 50 mg by mouth daily. 09/10/19  Yes [provider]  Na Sulfate-K Sulfate-Mg Sulf 17.5-3.13-1.6 GM/177ML SOLN Suprep (no substitutions)-TAKE AS DIRECTED. Patient not taking: Reported on 10/02/2019 09/28/19   Doran Stabler, MD     Physical Exam: Constitutional: Moderately built and nourished. Vitals:   10/05/19 0030 10/05/19 0045 10/05/19 0100 10/05/19 0115  BP: 133/90 139/82 (!) 146/84 (!) 149/80  Pulse: 70 68 70 65  Resp: 19 15 17 16   Temp:      TempSrc:      SpO2: 97% 96% 98% 96%  Weight:      Height:       Eyes: Anicteric no pallor. ENMT: No discharge from the ears eyes nose or mouth. Neck: No mass felt.  No neck rigidity. Respiratory: No rhonchi or crepitations. Cardiovascular: S1-S2 heard. Abdomen: Soft mild epigastric tenderness no guarding or rigidity. Musculoskeletal: No edema. Skin: No rash. Neurologic: Alert awake oriented to time place and person.  Moves all extremities. Psychiatric: Appears normal but normal affect.   Labs on Admission: I have personally reviewed following labs and imaging studies  CBC: Recent Labs  Lab 10/02/19 2110 10/04/19 1733  WBC 5.8 4.1  NEUTROABS 3.6  --   HGB 14.6 15.7  HCT 42.4 45.9  MCV 92.0 90.5  PLT 125* [id number]*   Basic Metabolic Panel: Recent Labs  Lab 10/02/19 2110 10/04/19 1733  NA 133* 133*  K 4.1 3.9  CL 98 95*  CO2 26 26  GLUCOSE 111* 100*  BUN 7 7  CREATININE 0.95 1.18  CALCIUM 8.7* 9.4   GFR: Estimated Creatinine Clearance: 66.6 mL/min (by C-G formula based on SCr of 1.18 mg/dL). Liver Function Tests: Recent Labs  Lab 10/02/19 2110 10/04/19 1733  AST 29 30  ALT 18 21  ALKPHOS 107 111  BILITOT 0.9 1.0  PROT 7.4 7.7  ALBUMIN 3.9 4.2   Recent Labs  Lab 10/02/19 2110 10/04/19 1733  LIPASE 34 29   No results for input(s): AMMONIA in the last 168 hours. Coagulation Profile: No results for input(s): INR, PROTIME in the last 168 hours. Cardiac Enzymes: No results for input(s): CKTOTAL, CKMB, CKMBINDEX, TROPONINI in the last 168 hours. BNP (last 3 results) No results for input(s): PROBNP in the last 8760 hours. HbA1C: No results for input(s): HGBA1C in the last 72 hours. CBG: No results for input(s): GLUCAP in the  last 168 hours. Lipid Profile: No results for input(s): CHOL, HDL, LDLCALC, TRIG, CHOLHDL, LDLDIRECT in the last 72 hours. Thyroid Function Tests: No results for input(s): TSH, T4TOTAL, FREET4, T3FREE, THYROIDAB in the last 72 hours. Anemia Panel: No results for input(s): VITAMINB12, FOLATE, FERRITIN, TIBC, IRON, RETICCTPCT in the last 72 hours. Urine analysis:    Component Value Date/Time   COLORURINE YELLOW 10/05/2019 0047   APPEARANCEUR CLEAR 10/05/2019 0047   LABSPEC >1.046 (H) 10/05/2019 [place]  5.0 10/05/2019 0047   GLUCOSEU NEGATIVE 10/05/2019 0047   HGBUR LARGE (A) 10/05/2019 0047   BILIRUBINUR NEGATIVE 10/05/2019 0047   KETONESUR 80 (A) 10/05/2019 0047   PROTEINUR 30 (A) 10/05/2019 0047   UROBILINOGEN 2.0 (H) 09/13/2016 1039   NITRITE NEGATIVE 10/05/2019 0047   LEUKOCYTESUR NEGATIVE 10/05/2019 0047   Sepsis Labs: @LABRCNTIP (procalcitonin:4,lacticidven:4) )No results found for this or any previous visit (from the past 240 hour(s)).   Radiological Exams on Admission: CT ABDOMEN PELVIS W CONTRAST  Result Date: 10/04/2019 CLINICAL DATA:  Right upper quadrant pain with nausea, vomiting and diarrhea. EXAM: CT ABDOMEN AND PELVIS WITH CONTRAST TECHNIQUE: Multidetector CT imaging of the abdomen and pelvis was performed using the standard protocol following bolus administration of intravenous contrast. CONTRAST:  112mL OMNIPAQUE IOHEXOL 300 MG/ML  SOLN COMPARISON:  December 30, 2018 FINDINGS: Lower chest: Very mild atelectasis is seen within the right lung base. Hepatobiliary: No focal liver abnormality is seen. No gallstones, gallbladder wall thickening, or biliary dilatation. Pancreas: A trace amount of peripancreatic inflammatory fat stranding is seen anterior and inferior to the head of the pancreas. Spleen: Normal in size without focal abnormality. Adrenals/Urinary Tract: Adrenal glands are unremarkable. Kidneys are normal, without renal calculi, focal lesion, or hydronephrosis.  The urinary bladder is limited in evaluation secondary to overlying streak artifact and otherwise appears normal. Stomach/Bowel: Stomach is within normal limits. The appendix is not identified. No evidence of bowel wall dilatation. Mild symmetric thickening of the duodenal bulb is seen (axial CT images 28 through 34, CT series number 3). Vascular/Lymphatic: Moderate severity aortic calcification. No enlarged abdominal or pelvic lymph nodes. Reproductive: Prostate gland is mildly enlarged. Other: Prominence of the umbilicus is seen without evidence of associated hernia. No abdominopelvic ascites. Musculoskeletal: A total left hip replacement is seen with associated streak artifact and subsequently limited evaluation of the adjacent osseous and soft tissue structures. IMPRESSION: 1. Trace amount of peripancreatic inflammatory fat stranding which may represent sequelae associated with mild acute pancreatitis. Correlation with pancreatic enzymes is recommended. 2. Mild symmetric thickening of the duodenal bulb which may be secondary to poor distension. 3. Total left hip replacement. Aortic Atherosclerosis (ICD10-I70.0). Electronically Signed   By: Virgina Norfolk M.D.   On: 10/04/2019 22:15    EKG: Independently reviewed.  Normal sinus rhythm.  Assessment/Plan Principal Problem:   Acute pancreatitis Active Problems:   Alcoholic pancreatitis   Essential hypertension   Blindness of both eyes   Alcohol use    1. Acute on chronic pancreatitis likely from chronic alcoholism for which patient is placed n.p.o. IV fluids pain relief medications and will follow labs.  Follow intake output. 2. Hypertension uncontrolled likely from pain for which I have placed patient on as needed IV hydralazine and closely follow blood pressure trends. 3. Alcohol abuse has had last drink around 4 days ago.  Closely follow for any alcohol withdrawal.  On CIWA.  Advised about quitting. 4. Blind in both eyes. 5. Chronic  thrombocytopenia could be from alcoholism. 6. Acute renal failure likely from nausea vomiting.  Will improve with hydration.  Since patient has acute pancreatitis will need close monitoring for any further deterioration and will need inpatient status.   DVT prophylaxis: Lovenox. Code Status: Full code. Family Communication: Discussed with patient. Disposition Plan: Home. Consults called: None. Admission status: Inpatient.   Rise Patience MD [facility] Pager [phone].  If 7PM-7AM, please contact night-coverage [email or web address] Password [facility]  10/05/2019, 3:47 AM

## 2019-10-06 DIAGNOSIS — R112 Nausea with vomiting, unspecified: Secondary | ICD-10-CM

## 2019-10-06 DIAGNOSIS — F1290 Cannabis use, unspecified, uncomplicated: Secondary | ICD-10-CM

## 2019-10-06 LAB — COMPREHENSIVE METABOLIC PANEL
ALT: 16 U/L (ref 0–44)
AST: 22 U/L (ref 15–41)
Albumin: 3.4 g/dL — ABNORMAL LOW (ref 3.5–5.0)
Alkaline Phosphatase: 86 U/L (ref 38–126)
Anion gap: 10 (ref 5–15)
BUN: 5 mg/dL — ABNORMAL LOW (ref 6–20)
CO2: 24 mmol/L (ref 22–32)
Calcium: 8.8 mg/dL — ABNORMAL LOW (ref 8.9–10.3)
Chloride: 99 mmol/L (ref 98–111)
Creatinine, Ser: 1 mg/dL (ref 0.61–1.24)
GFR calc Af Amer: >60 mL/min (ref >60)
GFR calc non Af Amer: >60 mL/min (ref >60)
Glucose, Bld: 88 mg/dL (ref 70–99)
Potassium: 3.9 mmol/L (ref 3.5–5.1)
Sodium: 133 mmol/L — ABNORMAL LOW (ref 135–145)
Total Bilirubin: 1.1 mg/dL (ref 0.3–1.2)
Total Protein: 6.6 g/dL (ref 6.5–8.1)

## 2019-10-06 LAB — CBC
HCT: 40.7 % (ref 39.0–52.0)
Hemoglobin: 14.3 g/dL (ref 13.0–17.0)
MCH: 31.5 pg (ref 26.0–34.0)
MCHC: 35.1 g/dL (ref 30.0–36.0)
MCV: 89.6 fL (ref 80.0–100.0)
Platelets: 120 10*3/uL — ABNORMAL LOW (ref 150–400)
RBC: 4.54 MIL/uL (ref 4.22–5.81)
RDW: 12.5 % (ref 11.5–15.5)
WBC: 3.8 10*3/uL — ABNORMAL LOW (ref 4.0–10.5)
nRBC: 0 % (ref 0.0–0.2)

## 2019-10-06 LAB — LIPASE, BLOOD: Lipase: 28 U/L (ref 11–51)

## 2019-10-06 LAB — HIV ANTIBODY (ROUTINE TESTING W REFLEX): HIV Screen 4th Generation wRfx: NONREACTIVE

## 2019-10-06 LAB — GLUCOSE, CAPILLARY: Glucose-Capillary: 87 mg/dL (ref 70–99)

## 2019-10-06 NOTE — Progress Notes (Signed)
Discharged home today accompanied by his brother.Discharged instructions,personal belongings given.Verbalized  Understanding of instructions.

## 2019-10-06 NOTE — Discharge Summary (Signed)
Physician Discharge Summary  Austin [name] [id number] DOB: [birth year] DOA: 10/04/2019  PCP: Buzzy Han, MD  Admit date: 10/04/2019 Discharge date: 10/06/2019  Admitted From: Home Disposition: Home  Recommendations for Outpatient Follow-up:  1. Follow up with PCP in 1-2 weeks 2. Please obtain BMP/CBC in one week 3. 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 Patient states his pain started about 5 days ago at that time patient had nausea vomiting and diarrhea. But diarrhea subsided after 1 day. Pain is mostly in the right upper quadrant radiating to the back. Denies any chest pain no fever chills. Admits to drinking alcohol every day. Last drink was around 4 days ago. In the ER CT abdomen pelvis shows features concerning for acute pancreatitis. Labs were largely unremarkable with normal lipase and LFTs. Platelets show count of 130. Covid test was negative. EKG shows normal sinus rhythm. Patient started on IV fluids admitted for acute pancreatitis  Patient admitted as above with acute onset intractable abdominal pain in the setting of history of chronic pancreatitis.  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  Patient tolerating p.o. well now without any further supportive care, abdominal pain resolved, patient remains off narcotics now for more than 12 hours.  Given resolution of patient's symptoms with minimal treatment including  only supportive care and IV fluids this is much less likely pancreatitis than hyperemesis syndrome secondary to marijuana use.  Discussed marijuana cessation over the next few weeks and slowly reintroduce marijuana if he is going to continue smoking while changing no other medications or diets to truly rule out if this is in the setting of marijuana use or not -we discussed cessation of marijuana completely would likely be his best option.  Otherwise stable and agreeable for discharge home, follow-up with PCP in the next 1 to 2 weeks as scheduled.  Discharge Diagnoses:  Principal Problem:   Acute pancreatitis Active Problems:   Alcoholic pancreatitis   Essential hypertension   Blindness of both eyes   Alcohol use    Discharge Instructions  Discharge Instructions    Call MD for:  difficulty breathing, headache or visual disturbances   Complete by: As directed    Call MD for:  extreme fatigue   Complete by: As directed    Call MD for:  hives   Complete by: As directed    Call MD for:  persistant dizziness or light-headedness   Complete by: As directed    Call MD for:  persistant nausea and vomiting   Complete by: As directed    Call MD for:  severe uncontrolled pain   Complete by: As directed    Call MD for:  temperature >100.4   Complete by: As directed    Diet - low sodium heart healthy   Complete by: As directed    Increase activity slowly   Complete by: As directed      Allergies as of 10/06/2019   No Known Allergies     Medication List    TAKE these medications   folic  acid 1 MG tablet Commonly known as: FOLVITE Take 1 mg by mouth daily.   Na Sulfate-K Sulfate-Mg Sulf 17.5-3.13-1.6 GM/177ML Soln Suprep (no substitutions)-TAKE AS DIRECTED.   ondansetron 4 MG tablet Commonly known as: ZOFRAN Take 4 mg by mouth every 8 (eight) hours as needed for nausea or vomiting.   VITAMIN D PO Take 1 tablet by mouth daily.   zinc gluconate 50 MG tablet Take 50 mg by mouth  daily.       No Known Allergies   Procedures/Studies: CT Head Wo Contrast  Result Date: 10/02/2019 CLINICAL DATA:  Syncopal episode.  Weakness.  Coronavirus infection. EXAM: CT HEAD WITHOUT CONTRAST CT CERVICAL SPINE WITHOUT CONTRAST TECHNIQUE: Multidetector CT imaging of the head and cervical spine was performed following the standard protocol without intravenous contrast. Multiplanar CT image reconstructions of the cervical spine were also generated. COMPARISON:  None. FINDINGS: CT HEAD FINDINGS Brain: The brain shows a normal appearance without evidence of malformation, atrophy, old or acute small or large vessel infarction, mass lesion, hemorrhage, hydrocephalus or extra-axial collection. Vascular: No hyperdense vessel. No evidence of atherosclerotic calcification. Skull: Normal.  No traumatic finding.  No focal bone lesion. Sinuses/Orbits: Visualized sinuses are clear. 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 Previous glaucoma surgery on the left. Other: None significant CT CERVICAL SPINE FINDINGS Alignment: Minimal scoliotic curvature. Skull base and vertebrae: No fracture or primary bone lesion. Soft tissues and spinal canal: No traumatic soft tissue finding. Disc levels: No significant degenerative change. No canal or foraminal stenosis. Upper chest: Pleural and parenchymal scarring at the apices. Other: None IMPRESSION: Normal appearance of the brain.  No traumatic finding. 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 Previous glaucoma surgery on the left. No acute or traumatic cervical spine finding. Electronically Signed   By: Nelson Chimes M.D.   On: 10/02/2019 21:06   CT Cervical Spine Wo Contrast  Result Date: 10/02/2019 CLINICAL DATA:  Syncopal episode.  Weakness.  Coronavirus infection. EXAM: CT HEAD WITHOUT CONTRAST CT CERVICAL SPINE WITHOUT CONTRAST TECHNIQUE: Multidetector CT imaging of the head and cervical spine was performed following the standard protocol without intravenous contrast. Multiplanar CT  image reconstructions of the cervical spine were also generated. COMPARISON:  None. FINDINGS: CT HEAD FINDINGS Brain: The brain shows a normal appearance without evidence of malformation, atrophy, old or acute small or large vessel infarction, mass lesion, hemorrhage, hydrocephalus or extra-axial collection. Vascular: No hyperdense vessel. No evidence of atherosclerotic calcification. Skull: Normal.  No traumatic finding.  No focal bone lesion. Sinuses/Orbits: Visualized sinuses are clear. 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 Previous glaucoma surgery on the left. Other: None significant CT CERVICAL SPINE FINDINGS Alignment: Minimal scoliotic curvature. Skull base and vertebrae: No fracture or primary bone lesion. Soft tissues and spinal canal: No traumatic soft tissue finding. Disc levels: No significant degenerative change. No canal or foraminal stenosis. Upper chest: Pleural and parenchymal scarring at the apices. Other: None IMPRESSION: Normal appearance of the brain.  No traumatic finding. 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 Previous glaucoma surgery on the left. No acute or traumatic cervical spine finding. Electronically Signed   By: Nelson Chimes M.D.   On: 10/02/2019 21:06   CT ABDOMEN PELVIS W CONTRAST  Result Date: 10/04/2019 CLINICAL DATA:  Right upper quadrant pain with nausea, vomiting and diarrhea. EXAM: CT ABDOMEN AND PELVIS WITH CONTRAST TECHNIQUE: Multidetector CT imaging of the abdomen and pelvis was performed using the standard protocol following bolus administration of intravenous contrast. CONTRAST:  12mL OMNIPAQUE IOHEXOL 300 MG/ML  SOLN  COMPARISON:  December 30, 2018 FINDINGS: Lower chest: Very mild atelectasis is seen within the right lung base. Hepatobiliary: No focal liver abnormality is seen. No gallstones, gallbladder wall thickening, or biliary dilatation. Pancreas: A trace amount of peripancreatic inflammatory fat stranding is seen anterior and inferior to the head of the pancreas. Spleen: Normal  in size without focal abnormality. Adrenals/Urinary Tract: Adrenal glands are unremarkable. Kidneys are normal, without renal calculi, focal lesion, or hydronephrosis. The urinary bladder is limited in evaluation secondary to overlying streak artifact and otherwise appears normal. Stomach/Bowel: Stomach is within normal limits. The appendix is not identified. No evidence of bowel wall dilatation. Mild symmetric thickening of the duodenal bulb is seen (axial CT images 28 through 34, CT series number 3). Vascular/Lymphatic: Moderate severity aortic calcification. No enlarged abdominal or pelvic lymph nodes. Reproductive: Prostate gland is mildly enlarged. Other: Prominence of the umbilicus is seen without evidence of associated hernia. No abdominopelvic ascites. Musculoskeletal: A total left hip replacement is seen with associated streak artifact and subsequently limited evaluation of the adjacent osseous and soft tissue structures. IMPRESSION: 1. Trace amount of peripancreatic inflammatory fat stranding which may represent sequelae associated with mild acute pancreatitis. Correlation with pancreatic enzymes is recommended. 2. Mild symmetric thickening of the duodenal bulb which may be secondary to poor distension. 3. Total left hip replacement. Aortic Atherosclerosis (ICD10-I70.0). Electronically Signed   By: Virgina Norfolk M.D.   On: 10/04/2019 22:15   DG Chest Portable 1 View  Result Date: 10/02/2019 CLINICAL DATA:  Chest pain short of breath EXAM: PORTABLE CHEST 1 VIEW COMPARISON:  01/22/2019 FINDINGS: The heart size and mediastinal contours are within normal limits. Both lungs are clear. The visualized skeletal structures are unremarkable. IMPRESSION: No active disease. Electronically Signed   By: Donavan Foil M.D.   On: 10/02/2019 21:26   XR HIP UNILAT W OR W/O PELVIS 2-3 VIEWS LEFT  Result Date: 09/25/2019 Standing AP pelvis to show hips and frog-leg lateral left hip obtained and reviewed.  This  shows left total hip arthroplasty with good leg lengths.  No subsidence or loosening.  No soft tissue calcification. Impression: Satisfactory total hip arthroplasty.  Unchanged from 06/06/2019 Intra-Op images.    Subjective: No acute issues or events overnight, nausea, vomiting, pain resolved.  Denies chest pain, shortness of breath, headache, fevers, chills.   Discharge Exam: Vitals:   10/05/19 2033 10/06/19 0240  BP: (!) 165/104 (!) 149/91  Pulse: 72 60  Resp: 16 18  Temp: 98.9 F (37.2 C) 98.5 F (36.9 C)  SpO2: 98% 100%   Vitals:   10/05/19 1315 10/05/19 1759 10/05/19 2033 10/06/19 0240  BP: (!) 155/89 (!) 145/86 (!) 165/104 (!) 149/91  Pulse: 68 66 72 60  Resp:  18 16 18   Temp:  98.8 F (37.1 C) 98.9 F (37.2 C) 98.5 F (36.9 C)  TempSrc:  Oral Oral Oral  SpO2:  100% 98% 100%  Weight:      Height:        General: Pt is alert, awake, not in acute distress Cardiovascular: RRR, S1/S2 +, no rubs, no gallops Respiratory: CTA bilaterally, no wheezing, no rhonchi Abdominal: Soft, NT, ND, bowel sounds + Extremities: no edema, no cyanosis    The results of significant diagnostics from this hospitalization (including imaging, microbiology, ancillary and laboratory) are listed below for reference.     Microbiology: Recent Results (from the past 240 hour(s))  Respiratory Panel by RT PCR (Flu A&B, Covid) - Nasopharyngeal Swab  Status: None   Collection Time: 10/05/19  2:51 AM   Specimen: Nasopharyngeal Swab  Result Value Ref Range Status   SARS Coronavirus 2 by RT PCR NEGATIVE NEGATIVE Final    Comment: (NOTE) SARS-CoV-2 target nucleic acids are NOT DETECTED. The SARS-CoV-2 RNA is generally detectable in upper respiratoy specimens during the acute phase of infection. The lowest concentration of SARS-CoV-2 viral copies this assay can detect is 131 copies/mL. A negative result does not preclude SARS-Cov-2 infection and should not be used as the sole basis for  treatment or other patient management decisions. A negative result may occur with  improper specimen collection/handling, submission of specimen other than nasopharyngeal swab, presence of viral mutation(s) within the areas targeted by this assay, and inadequate number of viral copies (<131 copies/mL). A negative result must be combined with clinical observations, patient history, and epidemiological information. The expected result is Negative. Fact Sheet for Patients:  PinkCheek.be Fact Sheet for Healthcare Providers:  GravelBags.it This test is not yet ap proved or cleared by the [place] FDA and  has been authorized for detection and/or diagnosis of SARS-CoV-2 by FDA under an Emergency Use Authorization (EUA). This EUA will remain  in effect (meaning this test can be used) for the duration of the COVID-19 declaration under Section 564(b)(1) of the Act, 21 U.S.C. section 360bbb-3(b)(1), unless the authorization is terminated or revoked sooner.    Influenza A by PCR NEGATIVE NEGATIVE Final   Influenza B by PCR NEGATIVE NEGATIVE Final    Comment: (NOTE) The Xpert Xpress SARS-CoV-2/FLU/RSV assay is intended as an aid in  the diagnosis of influenza from Nasopharyngeal swab specimens and  should not be used as a sole basis for treatment. Nasal washings and  aspirates are unacceptable for Xpert Xpress SARS-CoV-2/FLU/RSV  testing. Fact Sheet for Patients: PinkCheek.be Fact Sheet for Healthcare Providers: GravelBags.it This test is not yet approved or cleared by the [place] FDA and  has been authorized for detection and/or diagnosis of SARS-CoV-2 by  FDA under an Emergency Use Authorization (EUA). This EUA will remain  in effect (meaning this test can be used) for the duration of the  Covid-19 declaration under Section 564(b)(1) of the Act, 21  U.S.C. section  360bbb-3(b)(1), unless the authorization is  terminated or revoked. Performed at [facility], [place] [address] [place], [place] [address]      Labs: BNP (last 3 results) No results for input(s): BNP in the last 8760 hours. Basic Metabolic Panel: Recent Labs  Lab 10/02/19 2110 10/04/19 1733 10/05/19 0509 10/05/19 0652 10/06/19 0226  NA 133* 133*  --  135 133*  K 4.1 3.9  --  4.1 3.9  CL 98 95*  --  101 99  CO2 26 26  --  23 24  GLUCOSE 111* 100*  --  88 88  BUN 7 7  --  7 5*  CREATININE 0.95 1.18 1.06 1.01 1.00  CALCIUM 8.7* 9.4  --  8.7* 8.8*   Liver Function Tests: Recent Labs  Lab 10/02/19 2110 10/04/19 1733 10/05/19 0652 10/06/19 0226  AST 29 30 23 22   ALT 18 21 18 16   ALKPHOS 107 111 89 86  BILITOT 0.9 1.0 0.9 1.1  PROT 7.4 7.7 6.3* 6.6  ALBUMIN 3.9 4.2 3.5 3.4*   Recent Labs  Lab 10/02/19 2110 10/04/19 1733 10/06/19 0226  LIPASE 34 29 28   No results for input(s): AMMONIA in the last 168 hours. CBC: Recent Labs  Lab 10/02/19  2110 10/04/19 1733 10/05/19 0509 10/05/19 0652 10/06/19 0226  WBC 5.8 4.1 4.2 4.0 3.8*  NEUTROABS 3.6  --   --  1.5*  --   HGB 14.6 15.7 14.5 14.3 14.3  HCT 42.4 45.9 42.6 42.7 40.7  MCV 92.0 90.5 91.0 93.2 89.6  PLT 125* 130* 124* 116* 120*   Cardiac Enzymes: No results for input(s): CKTOTAL, CKMB, CKMBINDEX, TROPONINI in the last 168 hours. BNP: Invalid input(s): POCBNP CBG: Recent Labs  Lab 10/05/19 0355 10/05/19 0942 10/05/19 1636 10/05/19 2346 10/06/19 0754  GLUCAP 113* 92 151* 114* 87   D-Dimer No results for input(s): DDIMER in the last 72 hours. Hgb A1c No results for input(s): HGBA1C in the last 72 hours. Lipid Profile No results for input(s): CHOL, HDL, LDLCALC, TRIG, CHOLHDL, LDLDIRECT in the last 72 hours. Thyroid function studies No results for input(s): TSH, T4TOTAL, T3FREE, THYROIDAB in the last 72 hours.  Invalid input(s): FREET3 Anemia work up No results for input(s): VITAMINB12,  FOLATE, FERRITIN, TIBC, IRON, RETICCTPCT in the last 72 hours. Urinalysis    Component Value Date/Time   COLORURINE YELLOW 10/05/2019 0047   APPEARANCEUR CLEAR 10/05/2019 0047   LABSPEC >1.046 (H) 10/05/2019 0047   PHURINE 5.0 10/05/2019 0047   GLUCOSEU NEGATIVE 10/05/2019 0047   HGBUR LARGE (A) 10/05/2019 0047   BILIRUBINUR NEGATIVE 10/05/2019 0047   KETONESUR 80 (A) 10/05/2019 0047   PROTEINUR 30 (A) 10/05/2019 0047   UROBILINOGEN 2.0 (H) 09/13/2016 1039   NITRITE NEGATIVE 10/05/2019 0047   LEUKOCYTESUR NEGATIVE 10/05/2019 0047   Sepsis Labs Invalid input(s): PROCALCITONIN,  WBC,  LACTICIDVEN Microbiology Recent Results (from the past 240 hour(s))  Respiratory Panel by RT PCR (Flu A&B, Covid) - Nasopharyngeal Swab     Status: None   Collection Time: 10/05/19  2:51 AM   Specimen: Nasopharyngeal Swab  Result Value Ref Range Status   SARS Coronavirus 2 by RT PCR NEGATIVE NEGATIVE Final    Comment: (NOTE) SARS-CoV-2 target nucleic acids are NOT DETECTED. The SARS-CoV-2 RNA is generally detectable in upper respiratoy specimens during the acute phase of infection. The lowest concentration of SARS-CoV-2 viral copies this assay can detect is 131 copies/mL. A negative result does not preclude SARS-Cov-2 infection and should not be used as the sole basis for treatment or other patient management decisions. A negative result may occur with  improper specimen collection/handling, submission of specimen other than nasopharyngeal swab, presence of viral mutation(s) within the areas targeted by this assay, and inadequate number of viral copies (<131 copies/mL). A negative result must be combined with clinical observations, patient history, and epidemiological information. The expected result is Negative. Fact Sheet for Patients:  PinkCheek.be Fact Sheet for Healthcare Providers:  GravelBags.it This test is not yet ap proved or  cleared by the [place] FDA and  has been authorized for detection and/or diagnosis of SARS-CoV-2 by FDA under an Emergency Use Authorization (EUA). This EUA will remain  in effect (meaning this test can be used) for the duration of the COVID-19 declaration under Section 564(b)(1) of the Act, 21 U.S.C. section 360bbb-3(b)(1), unless the authorization is terminated or revoked sooner.    Influenza A by PCR NEGATIVE NEGATIVE Final   Influenza B by PCR NEGATIVE NEGATIVE Final    Comment: (NOTE) The Xpert Xpress SARS-CoV-2/FLU/RSV assay is intended as an aid in  the diagnosis of influenza from Nasopharyngeal swab specimens and  should not be used as a sole basis for treatment. Nasal washings and  aspirates are  unacceptable for Xpert Xpress SARS-CoV-2/FLU/RSV  testing. Fact Sheet for Patients: PinkCheek.be Fact Sheet for Healthcare Providers: GravelBags.it This test is not yet approved or cleared by the [place] FDA and  has been authorized for detection and/or diagnosis of SARS-CoV-2 by  FDA under an Emergency Use Authorization (EUA). This EUA will remain  in effect (meaning this test can be used) for the duration of the  Covid-19 declaration under Section 564(b)(1) of the Act, 21  U.S.C. section 360bbb-3(b)(1), unless the authorization is  terminated or revoked. Performed at [facility], [place] [address], [place], [place] [address]      Time coordinating discharge: Over 30 minutes  SIGNED:   Little Ishikawa, DO [facility] 10/06/2019, 3:16 PM Pager   If 7PM-7AM, please contact night-coverage [email or web address]

## 2019-10-11 ENCOUNTER — Encounter: Payer: Medicare Other | Admitting: Gastroenterology

## 2019-10-25 ENCOUNTER — Encounter: Payer: Self-pay | Admitting: Physician Assistant

## 2019-10-25 ENCOUNTER — Ambulatory Visit (INDEPENDENT_AMBULATORY_CARE_PROVIDER_SITE_OTHER): Payer: Medicare HMO | Admitting: Physician Assistant

## 2019-10-25 VITALS — BP 110/80 | HR 68 | Ht 67.0 in | Wt 149.0 lb

## 2019-10-25 DIAGNOSIS — Z01818 Encounter for other preprocedural examination: Secondary | ICD-10-CM

## 2019-10-25 DIAGNOSIS — Z1211 Encounter for screening for malignant neoplasm of colon: Secondary | ICD-10-CM

## 2019-10-25 NOTE — Addendum Note (Signed)
Addended by: Aleatha Borer on: 10/25/2019 11:29 AM   Modules accepted: Orders

## 2019-10-25 NOTE — Progress Notes (Signed)
Chief Complaint: Screening for colorectal cancer  HPI:    Austin [name] is a 54 year old African-American male, known to Dr. 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      10/04/2019-10/06/2019 admitted to the hospital for idiopathic acute pancreatitis.  He was noted he had a history of chronic alcohol abuse with previous history of pancreatitis, presented to the ED for abdominal pain nausea and vomiting.  That time he been drinking alcohol every day.  CT done and pelvis showed features concerning for acute pancreatitis.  Labs largely unremarkable.  Patient had normal labs.  Thought possibly had marijuana hyperemesis syndrome.  He was given fluids and pain meds.  As discussed he should stop marijuana use.    Today, patient presents to clinic and tells me he is doing well he is no further abdominal pain.  He is now being "a good boy".  Tells me he has stopped drinking again.  Does continue to smoke marijuana occasionally.  Has no further nausea vomiting or abdominal pain.  He is ready to get his colonoscopy.    Denies fever, chills, weight loss or symptoms that awaken him from sleep.  Past Medical History:  Diagnosis Date  . Alcohol abuse   . Anxiety   . Arthritis   . 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 Elevated LFTs   . Foot and toe(s), blister    bilat feet, blistering between toes  . GERD (gastroesophageal reflux disease)   . Glaucoma   . Glaucoma as birth trauma   . Headache(784.0)   . Hypertension    hx of  . Insomnia   . Pancreatitis   . UTI (lower urinary tract infection) 08/04/11   finishing abx tomorrow  . Weight loss    25 lb wt loss since 08/2010  . Wrist fracture, right     Past Surgical History:  Procedure Laterality Date  . COLONOSCOPY  2006?  . ESOPHAGOGASTRODUODENOSCOPY     . ESOPHAGOGASTRODUODENOSCOPY (EGD) WITH PROPOFOL N/A 05/09/2017   Procedure: ESOPHAGOGASTRODUODENOSCOPY (EGD) WITH PROPOFOL;  Surgeon: Doran Stabler, MD;  Location: [facility];  Service: Gastroenterology;  Laterality: N/A;  . EYE SURGERY Left   . EYE SURGERY Right    prostetic eye  . foot srugery     removal of bone near small toe  . FRACTURE SURGERY Right    wrist  . HARDWARE REMOVAL  08/06/2011   Procedure: HARDWARE REMOVAL;  Surgeon: Newt Minion, MD;  Location: [place];  Service: Orthopedics;  Laterality: Right;  Removal Deep Retained Hardware Right Distal Radius  . MULTIPLE TOOTH EXTRACTIONS     for dentures  . TOTAL HIP ARTHROPLASTY Left 06/06/2019   Procedure: LEFT TOTAL HIP ARTHROPLASTY-DIRECT ANTERIOR;  Surgeon: [name], MD;  Location: [place];  Service: Orthopedics;  Laterality: Left;    Current Outpatient Medications  Medication Sig Dispense Refill  . folic acid (FOLVITE) 1 MG tablet Take 1 mg by mouth daily.    . Na Sulfate-K Sulfate-Mg Sulf 17.5-3.13-1.6 GM/177ML SOLN Suprep (no substitutions)-TAKE AS DIRECTED. (Patient not taking: Reported on 10/02/2019) 354 mL 0  . ondansetron (ZOFRAN) 4 MG tablet Take 4 mg by mouth every 8 (eight) hours as needed for nausea or vomiting.     Marland Kitchen VITAMIN D PO Take 1  tablet by mouth daily.     Marland Kitchen zinc gluconate 50 MG tablet Take 50 mg by mouth daily.     No current facility-administered medications for this visit.    Allergies as of 10/25/2019  . (No Known Allergies)    Family History  Problem Relation Age of Onset  . Diabetes Sister   . Diabetes Maternal Aunt        aunt and uncles  . Hypertension Sister        aunt and uncles maternal side  . Colon cancer Neg Hx   . Colon polyps Neg Hx   . Esophageal cancer Neg Hx   . Rectal cancer Neg Hx   . Stomach cancer Neg Hx     Social History   Socioeconomic History  . Marital status: Significant Other    Spouse name: Not on file  . Number of children: 1  . Years of  education: Not on file  . Highest education level: Not on file  Occupational History  . Occupation: disabled  Tobacco Use  . Smoking status: Current Every Day Smoker    Packs/day: 0.25    Years: 17.00    Pack years: 4.25    Types: Cigars  . Smokeless tobacco: Never Used  . Tobacco comment: 3 cigars a day   Substance and Sexual Activity  . Alcohol use: Yes    Comment: 1 pint a day of liquor per pt  . Drug use: Yes    Frequency: 7.0 times per week    Types: Marijuana    Comment: smokes marijuana 1 to 2 times daily  . Sexual activity: Not Currently  Other Topics Concern  . Not on file  Social History Narrative  . Not on file   Social Determinants of Health   Financial Resource Strain:   . Difficulty of Paying Living Expenses:   Food Insecurity:   . Worried About Charity fundraiser in the Last Year:   . Arboriculturist in the Last Year:   Transportation Needs:   . Film/video editor (Medical):   Marland Kitchen Lack of Transportation (Non-Medical):   Physical Activity:   . Days of Exercise per Week:   . Minutes of Exercise per Session:   Stress:   . Feeling of Stress :   Social Connections:   . Frequency of Communication with Friends and Family:   . Frequency of Social Gatherings with Friends and Family:   . Attends Religious Services:   . Active Member of Clubs or Organizations:   . Attends Archivist Meetings:   Marland Kitchen Marital Status:   Intimate Partner Violence:   . Fear of Current or Ex-Partner:   . Emotionally Abused:   Marland Kitchen Physically Abused:   . Sexually Abused:     Review of Systems:    Constitutional: No weight loss, fever or chills Cardiovascular: No chest pain Respiratory: No SOB  Gastrointestinal: See HPI and otherwise negative   Physical Exam:  Vital signs: BP 110/80   Pulse 68   Ht 5\' 7"  (1.702 m)   Wt 149 lb (67.6 kg)   BMI 23.34 kg/m   Constitutional:   Pleasant AA male appears to be in NAD, Well developed, Well nourished, alert and  cooperative Respiratory: Respirations even and unlabored. Lungs clear to auscultation bilaterally.   No wheezes, crackles, or rhonchi.  Cardiovascular: Normal S1, S2. No MRG. Regular rate and rhythm. No peripheral edema, cyanosis or pallor.  Gastrointestinal:  Soft, nondistended, nontender.  No rebound or guarding. Normal bowel sounds. No appreciable masses or hepatomegaly. Rectal:  Not performed.  Psychiatric: Demonstrates good judgement and reason without abnormal affect or behaviors.  RELEVANT LABS AND IMAGING: CBC    Component Value Date/Time   WBC 3.8 (L) 10/06/2019 0226   RBC 4.54 10/06/2019 0226   HGB 14.3 10/06/2019 0226   HCT 40.7 10/06/2019 0226   PLT 120 (L) 10/06/2019 0226   MCV 89.6 10/06/2019 0226   MCH 31.5 10/06/2019 0226   MCHC 35.1 10/06/2019 0226   RDW 12.5 10/06/2019 0226   LYMPHSABS 1.8 10/05/2019 0652   MONOABS 0.6 10/05/2019 0652   EOSABS 0.1 10/05/2019 0652   BASOSABS 0.0 10/05/2019 0652    CMP     Component Value Date/Time   NA 133 (L) 10/06/2019 0226   K 3.9 10/06/2019 0226   CL 99 10/06/2019 0226   CO2 24 10/06/2019 0226   GLUCOSE 88 10/06/2019 0226   BUN 5 (L) 10/06/2019 0226   CREATININE 1.00 10/06/2019 0226   CALCIUM 8.8 (L) 10/06/2019 0226   PROT 6.6 10/06/2019 0226   ALBUMIN 3.4 (L) 10/06/2019 0226   AST 22 10/06/2019 0226   ALT 16 10/06/2019 0226   ALKPHOS 86 10/06/2019 0226   BILITOT 1.1 10/06/2019 0226   GFRNONAA >60 10/06/2019 0226   GFRAA >60 10/06/2019 0226    Assessment: 1.  Screening for colorectal cancer: Patient is 27 and never had a colonoscopy  Plan: 1.  Scheduled patient for a screening colonoscopy in the [place] with Dr. Loletha Carrow.  Did discuss risks, benefits, limitations and alternatives and patient agrees to proceed.  He will be Covid tested 2 days prior to time of procedure. 2.  Patient to return to clinic per recommendations from Dr. Loletha Carrow after time of procedure.  Ellouise Newer, PA-C [facility] 10/25/2019, 10:54 AM  Cc: Buzzy Han*

## 2019-10-25 NOTE — Patient Instructions (Signed)
If you are age 54 or older, your body mass index should be between 23-30. Your Body mass index is 23.34 kg/m. If this is out of the aforementioned range listed, please consider follow up with your Primary Care Provider.  If you are age 73 or younger, your body mass index should be between 19-25. Your Body mass index is 23.34 kg/m. If this is out of the aformentioned range listed, please consider follow up with your Primary Care Provider.   You have been scheduled for a colonoscopy. Please follow written instructions given to you at your visit today.  Please pick up your prep supplies at the pharmacy within the next 1-3 days. If you use inhalers (even only as needed), please bring them with you on the day of your procedure.  Your follow up is pending the results of your colonoscopy.

## 2019-10-31 NOTE — Progress Notes (Signed)
____________________________________________________________  Attending physician addendum:  Thank you for sending this case to me. I have reviewed the entire note, and the outlined plan seems appropriate.  Morgane Joerger Danis, MD  ____________________________________________________________  

## 2019-11-06 ENCOUNTER — Ambulatory Visit: Payer: Medicare Other | Admitting: Podiatry

## 2019-11-12 ENCOUNTER — Ambulatory Visit: Payer: Medicare HMO | Admitting: Podiatry

## 2019-11-14 ENCOUNTER — Encounter: Payer: Medicare HMO | Admitting: Gastroenterology

## 2019-12-03 ENCOUNTER — Encounter: Payer: Self-pay | Admitting: Podiatry

## 2019-12-03 ENCOUNTER — Ambulatory Visit (INDEPENDENT_AMBULATORY_CARE_PROVIDER_SITE_OTHER): Payer: Medicare HMO | Admitting: Podiatry

## 2019-12-03 ENCOUNTER — Other Ambulatory Visit: Payer: Self-pay

## 2019-12-03 DIAGNOSIS — B351 Tinea unguium: Secondary | ICD-10-CM | POA: Diagnosis not present

## 2019-12-03 DIAGNOSIS — M79672 Pain in left foot: Secondary | ICD-10-CM

## 2019-12-03 DIAGNOSIS — M79676 Pain in unspecified toe(s): Secondary | ICD-10-CM

## 2019-12-03 DIAGNOSIS — Q828 Other specified congenital malformations of skin: Secondary | ICD-10-CM

## 2019-12-03 DIAGNOSIS — L84 Corns and callosities: Secondary | ICD-10-CM

## 2019-12-03 DIAGNOSIS — M79671 Pain in right foot: Secondary | ICD-10-CM

## 2019-12-08 NOTE — Progress Notes (Signed)
Subjective:  Patient ID: Austin [name], male    DOB: [birth year],  MRN: [id number]  54 y.o. male presents with painful porokeratotic lesion(s) right foot, painful calluses b/l feet and painful mycotic toenails that limit ambulation. Aggravating factors include weightbearing with and without shoe gear. Pain for both is relieved with periodic professional debridement..    Review of Systems: Negative except as noted in the HPI. Denies N/V/F/Ch.  Past Medical History:  Diagnosis Date  . Alcohol abuse   . Anxiety   . Arthritis   . 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 Elevated LFTs   . Foot and toe(s), blister    bilat feet, blistering between toes  . GERD (gastroesophageal reflux disease)   . Glaucoma   . Glaucoma as birth trauma   . Headache(784.0)   . Hypertension    hx of  . Insomnia   . Pancreatitis   . UTI (lower urinary tract infection) 08/04/11   finishing abx tomorrow  . Weight loss    25 lb wt loss since 08/2010  . Wrist fracture, right    Past Surgical History:  Procedure Laterality Date  . COLONOSCOPY  2006?  . ESOPHAGOGASTRODUODENOSCOPY    . ESOPHAGOGASTRODUODENOSCOPY (EGD) WITH PROPOFOL N/A 05/09/2017   Procedure: ESOPHAGOGASTRODUODENOSCOPY (EGD) WITH PROPOFOL;  Surgeon: Doran Stabler, MD;  Location: [facility];  Service: Gastroenterology;  Laterality: N/A;  . EYE SURGERY Left   . EYE SURGERY Right    prostetic eye  . foot srugery     removal of bone near small toe  . FRACTURE SURGERY Right    wrist  . HARDWARE REMOVAL  08/06/2011   Procedure: HARDWARE REMOVAL;  Surgeon: Newt Minion, MD;  Location: [place];  Service: Orthopedics;  Laterality: Right;  Removal Deep Retained Hardware Right Distal Radius  . MULTIPLE TOOTH EXTRACTIONS     for dentures  . TOTAL HIP ARTHROPLASTY Left 06/06/2019   Procedure: LEFT TOTAL HIP ARTHROPLASTY-DIRECT ANTERIOR;  Surgeon: [name], MD;  Location: [place];  Service: Orthopedics;  Laterality: Left;   Patient  Active Problem List   Diagnosis Date Noted  . Acute pancreatitis 10/05/2019  . 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 Intractable nausea and vomiting 12/29/2018  . Alcoholic pancreatitis 34/19/3790  . Essential hypertension 04/14/2018  . Blindness of both eyes 04/14/2018  . Alcohol use 04/14/2018  . Hyponatremia 04/14/2018  . RUQ pain   . Abnormal CT of the abdomen   . 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 Retained orthopedic hardware 08/07/2011    Class: Chronic    Current Outpatient Medications:  .  folic acid (FOLVITE) 1 MG tablet, Take 1 mg by mouth daily., Disp: , Rfl:  .  Na Sulfate-K Sulfate-Mg Sulf 17.5-3.13-1.6 GM/177ML SOLN, Suprep (no substitutions)-TAKE AS DIRECTED., Disp: 354 mL, Rfl: 0 .  ondansetron (ZOFRAN) 4 MG tablet, Take 4 mg by mouth every 8 (eight) hours as needed for nausea or vomiting. , Disp: , Rfl:  .  VITAMIN D PO, Take 1 tablet by mouth daily. , Disp: , Rfl:  .  vitamin E 180 MG (400 UNITS) capsule, Take 400 Units by mouth daily., Disp: , Rfl:  .  zinc gluconate 50 MG tablet, Take 50 mg by mouth daily., Disp: , Rfl:  No Known Allergies Social History   Tobacco Use  Smoking Status Current Every Day Smoker  . Packs/day: 0.25  . Years: 17.00  . Pack  years: 4.25  . Types: Cigars  Smokeless Tobacco Never Used  Tobacco Comment   3 cigars a day      Objective:  There were no vitals filed for this visit. There is no height or weight on file to calculate BMI. Constitutional Well developed. Well nourished.  Vascular Dorsalis pedis pulses palpable bilaterally. Posterior tibial pulses palpable bilaterally. Capillary refill normal to all digits.  No cyanosis or clubbing noted. Pedal hair growth sparse b/l.  Neurologic Normal speech. Oriented to person, place, and time. Epicritic sensation to light touch grossly present bilaterally.  Dermatologic Nails elongated dystrophic pain to palpation. Hyperkeratotic lesion submet head 5  left foot and b/l hallux with tenderness to palpation. No edema, no erythema, no drainage, no flocculence. Porokeratotic lesions submet head 4 right foot with tenderness to palpation. No erythema, no edema, no drainage, no flocculence. No open wounds. No skin lesions.  Orthopedic: Normal joint ROM without pain or crepitus bilaterally. Plantarflexed 4th metatarsal right foot.   Radiographs: None Assessment:   1. Pain due to onychomycosis of toenail   2. Porokeratosis   3. Callus   4. Pain in both feet    Plan:  Patient was evaluated and treated and all questions answered.  Onychomycosis with pain -Nails palliatively debridement as below. -Educated on self-care  Procedure: Nail Debridement Rationale: Pain Type of Debridement: manual, sharp debridement. Instrumentation: Nail nipper, rotary burr. Number of Nails: 10  -Callus(es) L hallux, R hallux and submet head 5 left foot pared utilizing sterile scalpel blade without complication or incident. Total number debrided =3. -Painful porokeratotic lesion(s) submet head 4 right foot pared and enucleated with sterile scalpel blade without incident. Patient was instructed to apply Neosporin to right foot lesion once daily for 1 week.  -Patient to report any pedal injuries to medical professional immediately. -Patient to continue soft, supportive shoe gear daily. -Patient/POA to call should there be question/concern in the interim.  Return in about 3 months (around 03/04/2020) for nail trim.  Marzetta Board, DPM

## 2019-12-20 ENCOUNTER — Encounter: Payer: Self-pay | Admitting: Surgery

## 2019-12-20 ENCOUNTER — Ambulatory Visit: Payer: Self-pay

## 2019-12-20 ENCOUNTER — Ambulatory Visit (INDEPENDENT_AMBULATORY_CARE_PROVIDER_SITE_OTHER): Payer: Medicare HMO | Admitting: Surgery

## 2019-12-20 VITALS — BP 140/81 | HR 72 | Ht 67.0 in | Wt 149.0 lb

## 2019-12-20 DIAGNOSIS — M87051 Idiopathic aseptic necrosis of right femur: Secondary | ICD-10-CM | POA: Diagnosis not present

## 2019-12-20 DIAGNOSIS — M25559 Pain in unspecified hip: Secondary | ICD-10-CM

## 2019-12-20 NOTE — Progress Notes (Signed)
54 year old black male history of right hip AVN comes in with complaints of hip pain.  He is status post left total hip replacement for AVN June 06, 2019.  Right hip continues to be an ongoing issue and he is ready to schedule right total hip replacement as previously discussed with Dr. Lorin Mercy.  He is very pleased with surgery result of his left hip.   Exam Gait antalgic.  Marked right hip pain with about 5 to 10 degrees internal/external rotation.  He is moderately tender over the right hip greater trochanter bursa.  Neurovascular intact.   Assessment Right hip AVN.   Plan We will proceed with scheduling of right total hip replacement.  Preop paperwork filled out.  We will get medical clearance.

## 2019-12-25 ENCOUNTER — Other Ambulatory Visit: Payer: Self-pay

## 2019-12-27 ENCOUNTER — Ambulatory Visit (INDEPENDENT_AMBULATORY_CARE_PROVIDER_SITE_OTHER): Payer: Medicare HMO | Admitting: Surgery

## 2019-12-27 ENCOUNTER — Encounter: Payer: Self-pay | Admitting: Surgery

## 2019-12-27 ENCOUNTER — Other Ambulatory Visit: Payer: Self-pay

## 2019-12-27 VITALS — BP 142/80 | HR 80 | Ht 67.0 in | Wt 149.0 lb

## 2019-12-27 DIAGNOSIS — M87051 Idiopathic aseptic necrosis of right femur: Secondary | ICD-10-CM

## 2019-12-27 NOTE — Progress Notes (Signed)
54 year old black male history of right hip AVN and chronic pain comes in for preop evaluation.  Hip symptoms unchanged from previous visit.  He is wanting to proceed with right total hip replacement as scheduled.  Patient status post left total hip replacement June 06, 2019 and is doing extremely well from that.  Today history and physical performed.  Review of systems negative.  We have received preop medical clearance.  All questions answered.

## 2020-01-01 ENCOUNTER — Telehealth: Payer: Self-pay | Admitting: Orthopaedic Surgery

## 2020-01-01 NOTE — Telephone Encounter (Signed)
Patient called. He would like a note for work. He is unable to work at this time. Do to pain. His call back number is [phone]

## 2020-01-01 NOTE — Telephone Encounter (Signed)
Please advise. You had ok'd out of work note. Patient is needing it to start 12/27/2019 and last through post op recovery. OK to back date note?

## 2020-01-01 NOTE — Telephone Encounter (Signed)
Patient state he has been absent from work since 22nd of July.  He is needing a note to give to his employer [employer]) explaining reasons for absence and approximate length of time out of work.  Patient is scheduled for his right total hip on 01/07/20 and his post op is 01/22/20.    Patient would like this information faxed to the following number:    Fax [phone] Attn:  Manuela Schwartz (phone: [phone])      Pt's cb  [phone]

## 2020-01-01 NOTE — Telephone Encounter (Signed)
Yes OK for note thanks

## 2020-01-01 NOTE — Telephone Encounter (Signed)
Yes I guess OK to do  thanks, has AVN .

## 2020-01-01 NOTE — Telephone Encounter (Signed)
Patient returned call with dates needed for note and where to send it. Have to get ok from Dr. Lorin Mercy to backdate note. Message in chart has been sent to him to advise.

## 2020-01-01 NOTE — Telephone Encounter (Signed)
Please advise. Patient is scheduled for Right THA 01/07/2020.

## 2020-01-01 NOTE — H&P (Signed)
TOTAL HIP ADMISSION H&P  Patient is admitted for right total hip arthroplasty.  Subjective:  Chief Complaint: right hip pain  HPI: Austin [name], 54 y.o. male, has a history of pain and functional disability in the right hip(s) due to arthritis/avascular necrosis and patient has failed non-surgical conservative treatments for greater than 12 weeks to include NSAID's and/or analgesics, use of assistive devices and activity modification.  Onset of symptoms was gradual starting 10 years ago with gradually worsening course since that time.The patient noted no past surgery on the right hip(s).  Patient currently rates pain in the right hip at 10 out of 10 with activity. Patient has joint swelling. Patient has evidence of subchondral sclerosis, periarticular osteophytes and joint space narrowing by imaging studies. This condition presents safety issues increasing the risk of falls. This patient has had avascular necrosis of the hip, acetabular fracture, hip dysplasia.  There is no current active infection.  Patient Active Problem List   Diagnosis Date Noted  . Acute pancreatitis 10/05/2019  . 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 Intractable nausea and vomiting 12/29/2018  . Alcoholic pancreatitis 42/70/6237  . Essential hypertension 04/14/2018  . Blindness of both eyes 04/14/2018  . Alcohol use 04/14/2018  . Hyponatremia 04/14/2018  . RUQ pain   . Abnormal CT of the abdomen   . 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 Retained orthopedic hardware 08/07/2011   Past Medical History:  Diagnosis Date  . Alcohol abuse   . Anxiety   . Arthritis   . 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 Elevated LFTs   . Foot and toe(s), blister    bilat feet, blistering between toes  . GERD (gastroesophageal reflux disease)   . Glaucoma   . Glaucoma as birth trauma   . Headache(784.0)   . Hypertension    hx of  . Insomnia   . Pancreatitis   . UTI (lower urinary tract  infection) 08/04/11   finishing abx tomorrow  . Weight loss    25 lb wt loss since 08/2010  . Wrist fracture, right     Past Surgical History:  Procedure Laterality Date  . COLONOSCOPY  2006?  . ESOPHAGOGASTRODUODENOSCOPY    . ESOPHAGOGASTRODUODENOSCOPY (EGD) WITH PROPOFOL N/A 05/09/2017   Procedure: ESOPHAGOGASTRODUODENOSCOPY (EGD) WITH PROPOFOL;  Surgeon: Doran Stabler, MD;  Location: [facility];  Service: Gastroenterology;  Laterality: N/A;  . EYE SURGERY Left   . EYE SURGERY Right    prostetic eye  . foot srugery     removal of bone near small toe  . FRACTURE SURGERY Right    wrist  . HARDWARE REMOVAL  08/06/2011   Procedure: HARDWARE REMOVAL;  Surgeon: Newt Minion, MD;  Location: [place];  Service: Orthopedics;  Laterality: Right;  Removal Deep Retained Hardware Right Distal Radius  . MULTIPLE TOOTH EXTRACTIONS     for dentures  . TOTAL HIP ARTHROPLASTY Left 06/06/2019   Procedure: LEFT TOTAL HIP ARTHROPLASTY-DIRECT ANTERIOR;  Surgeon: [name], MD;  Location: [place];  Service: Orthopedics;  Laterality: Left;    No current facility-administered medications for this encounter.   Current Outpatient Medications  Medication Sig Dispense Refill Last Dose  . ascorbic acid (VITAMIN C) 500 MG tablet Take 500 mg by mouth daily.     . folic acid (FOLVITE) 1 MG tablet Take 1 mg by mouth daily.     . ondansetron (ZOFRAN) 4 MG tablet Take  4 mg by mouth every 8 (eight) hours as needed for nausea or vomiting.      Marland Kitchen OVER THE COUNTER MEDICATION Take 2 tablets by mouth daily. Sea moss otc supplement     . vitamin E 180 MG (400 UNITS) capsule Take 400 Units by mouth daily.     . Zinc Sulfate (ZINC 15 PO) Take 30 mg by mouth daily.     . Na Sulfate-K Sulfate-Mg Sulf 17.5-3.13-1.6 GM/177ML SOLN Suprep (no substitutions)-TAKE AS DIRECTED. 354 mL 0   . VITAMIN D PO Take 1 tablet by mouth daily.       No Known Allergies  Social History   Tobacco Use  . Smoking status: Current Every Day  Smoker    Packs/day: 0.25    Years: 17.00    Pack years: 4.25    Types: Cigars  . Smokeless tobacco: Never Used  . Tobacco comment: 3 cigars a day   Substance Use Topics  . Alcohol use: Yes    Comment: 1 pint a day of liquor per pt    Family History  Problem Relation Age of Onset  . Diabetes Sister   . Diabetes Maternal Aunt        aunt and uncles  . Hypertension Sister        aunt and uncles maternal side  . Colon cancer Neg Hx   . Colon polyps Neg Hx   . Esophageal cancer Neg Hx   . Rectal cancer Neg Hx   . Stomach cancer Neg Hx      Review of Systems  Constitutional: Positive for activity change.  HENT: Negative.   Respiratory: Negative.   Cardiovascular: Negative.   Gastrointestinal: Negative.   Genitourinary: Negative.   Musculoskeletal: Positive for gait problem.  Skin: Negative.   Psychiatric/Behavioral: Negative.     Objective:  Physical Exam  Vital signs in last 24 hours:    Labs:   Estimated body mass index is 23.34 kg/m as calculated from the following:   Height as of 12/27/19: 5\' 7"  (1.702 m).   Weight as of 12/27/19: 67.6 kg.   Imaging Review Plain radiographs demonstrate moderate degenerative joint disease of the right hip(s). The bone quality appears to be good for age and reported activity level.      Assessment/Plan:  End stage arthritis, right hip(s)  The patient history, physical examination, clinical judgement of the provider and imaging studies are consistent with end stage degenerative joint disease of the right hip(s) and total hip arthroplasty is deemed medically necessary. The treatment options including medical management, injection therapy, arthroscopy and arthroplasty were discussed at length. The risks and benefits of total hip arthroplasty were presented and reviewed. The risks due to aseptic loosening, infection, stiffness, dislocation/subluxation,  thromboembolic complications and other imponderables were discussed.  The  patient acknowledged the explanation, agreed to proceed with the plan and consent was signed. Patient is being admitted for inpatient treatment for surgery, pain control, PT, OT, prophylactic antibiotics, VTE prophylaxis, progressive ambulation and ADL's and discharge planning.The patient is planning to be discharged home with home health services

## 2020-01-02 ENCOUNTER — Telehealth: Payer: Self-pay | Admitting: Orthopaedic Surgery

## 2020-01-02 NOTE — Telephone Encounter (Signed)
Patient state he has been absent from work since 22nd of July.  He is needing a note to give to his employer [employer]) explaining reasons for absence and approximate length of time out of work.  Patient is scheduled for his right total hip on 01/07/20 and his post op is 01/22/20.    Patient would like this information faxed to the following number:    Fax [phone] Attn:  Manuela Schwartz (phone: [phone])      Pt's cb  [phone]

## 2020-01-02 NOTE — Progress Notes (Signed)
[place], [place] [address] Phone: [phone] Fax: [phone]      Your procedure is scheduled on Tuesday, August 2nd.  Report to [facility] Main Entrance "A" at 10:30 A.M., and check in at the Admitting office.  Call this number if you have problems the morning of surgery:  [phone]  Call [phone] if you have any questions prior to your surgery date Monday-Friday 8am-4pm    Remember:  Do not eat after midnight the night before your surgery  You may drink clear liquids until 9:30 AM the morning of your surgery.   Clear liquids allowed are: Water, Non-Citrus Juices (without pulp), Carbonated Beverages, Clear Tea, Black Coffee Only, and Gatorade  Finish the pre-surgery Ensure by 9:30 AM the morning of surgery.  Drink all in one sitting. Do not sip.  Nothing else to drink after you finish the Ensure.    Take these medicines the morning of surgery with A SIP OF WATER   Zofran - if needed  As of today, STOP taking any Aspirin (unless otherwise instructed by your surgeon) Aleve, Naproxen, Ibuprofen, Motrin, Advil, Goody's, BC's, all herbal medications, fish oil, and all vitamins.                      Do not wear jewelry            Do not wear lotions, powders, colognes, or deodorant.            Men may shave face and neck.            Do not bring valuables to the hospital.            [facility] is not responsible for any belongings or valuables.  Do NOT Smoke (Tobacco/Vaping) or drink Alcohol 24 hours prior to your procedure If you use a CPAP at night, you may bring all equipment for your overnight stay.   Contacts, glasses, dentures or bridgework may not be worn into surgery.      For patients admitted to the hospital, discharge time will be determined by your treatment team.   Patients discharged the day of surgery will not be allowed to drive home, and someone needs to stay with  them for 24 hours.    Special instructions:   [facility]- Preparing For Surgery  Before surgery, you can play an important role. Because skin is not sterile, your skin needs to be as free of germs as possible. You can reduce the number of germs on your skin by washing with CHG (chlorahexidine gluconate) Soap before surgery.  CHG is an antiseptic cleaner which kills germs and bonds with the skin to continue killing germs even after washing.    Oral Hygiene is also important to reduce your risk of infection.  Remember - BRUSH YOUR TEETH THE MORNING OF SURGERY WITH YOUR REGULAR TOOTHPASTE  Please do not use if you have an allergy to CHG or antibacterial soaps. If your skin becomes reddened/irritated stop using the CHG.  Do not shave (including legs and underarms) for at least 48 hours prior to first CHG shower. It is OK to shave your face.  Please follow these instructions carefully.   1. Shower the NIGHT BEFORE SURGERY and the MORNING OF SURGERY with CHG Soap.   2. If you chose to wash your hair, wash your hair first as usual with your normal shampoo.  3.  After you shampoo, rinse your hair and body thoroughly to remove the shampoo.  4. Use CHG as you would any other liquid soap. You can apply CHG directly to the skin and wash gently with a scrungie or a clean washcloth.   5. Apply the CHG Soap to your body ONLY FROM THE NECK DOWN.  Do not use on open wounds or open sores. Avoid contact with your eyes, ears, mouth and genitals (private parts). Wash Face and genitals (private parts)  with your normal soap.   6. Wash thoroughly, paying special attention to the area where your surgery will be performed.  7. Thoroughly rinse your body with warm water from the neck down.  8. DO NOT shower/wash with your normal soap after using and rinsing off the CHG Soap.  9. Pat yourself dry with a CLEAN TOWEL.  10. Wear CLEAN PAJAMAS to bed the night before surgery  11. Place CLEAN SHEETS on your bed  the night of your first shower and DO NOT SLEEP WITH PETS.   Day of Surgery: Wear Clean/Comfortable clothing the morning of surgery Do not apply any deodorants/lotions.   Remember to brush your teeth WITH YOUR REGULAR TOOTHPASTE.   Please read over the following fact sheets that you were given.

## 2020-01-03 ENCOUNTER — Encounter (HOSPITAL_COMMUNITY): Payer: Self-pay

## 2020-01-03 ENCOUNTER — Other Ambulatory Visit (HOSPITAL_COMMUNITY)
Admission: RE | Admit: 2020-01-03 | Discharge: 2020-01-03 | Disposition: A | Discharge status: Home / Self Care | Payer: Medicare HMO | Source: Ambulatory Visit | Attending: Orthopaedic Surgery | Admitting: Orthopaedic Surgery

## 2020-01-03 ENCOUNTER — Other Ambulatory Visit: Payer: Self-pay

## 2020-01-03 ENCOUNTER — Encounter (HOSPITAL_COMMUNITY)
Admission: RE | Admit: 2020-01-03 | Discharge: 2020-01-03 | Disposition: A | Discharge status: Home / Self Care | Payer: Medicare HMO | Source: Ambulatory Visit | Attending: Orthopaedic Surgery | Admitting: Orthopaedic Surgery

## 2020-01-03 DIAGNOSIS — Z01812 Encounter for preprocedural laboratory examination: Secondary | ICD-10-CM | POA: Insufficient documentation

## 2020-01-03 DIAGNOSIS — Z20822 Contact with and (suspected) exposure to covid-19: Secondary | ICD-10-CM | POA: Insufficient documentation

## 2020-01-03 LAB — SARS CORONAVIRUS 2 (TAT 6-24 HRS): SARS Coronavirus 2: NEGATIVE

## 2020-01-03 LAB — PROTIME-INR
INR: 0.9 (ref 0.8–1.2)
Prothrombin Time: 12 seconds (ref 11.4–15.2)

## 2020-01-03 LAB — COMPREHENSIVE METABOLIC PANEL
ALT: 12 U/L (ref 0–44)
AST: 21 U/L (ref 15–41)
Albumin: 4.1 g/dL (ref 3.5–5.0)
Alkaline Phosphatase: 100 U/L (ref 38–126)
Anion gap: 9 (ref 5–15)
BUN: 13 mg/dL (ref 6–20)
CO2: 28 mmol/L (ref 22–32)
Calcium: 9.3 mg/dL (ref 8.9–10.3)
Chloride: 98 mmol/L (ref 98–111)
Creatinine, Ser: 1.11 mg/dL (ref 0.61–1.24)
GFR calc Af Amer: >60 mL/min (ref >60)
GFR calc non Af Amer: >60 mL/min (ref >60)
Glucose, Bld: 87 mg/dL (ref 70–99)
Potassium: 4.1 mmol/L (ref 3.5–5.1)
Sodium: 135 mmol/L (ref 135–145)
Total Bilirubin: 0.6 mg/dL (ref 0.3–1.2)
Total Protein: 7.3 g/dL (ref 6.5–8.1)

## 2020-01-03 LAB — URINALYSIS, ROUTINE W REFLEX MICROSCOPIC
Bacteria, UA: NONE SEEN
Bilirubin Urine: NEGATIVE
Glucose, UA: NEGATIVE mg/dL
Ketones, ur: NEGATIVE mg/dL
Leukocytes,Ua: NEGATIVE
Nitrite: NEGATIVE
Protein, ur: NEGATIVE mg/dL
Specific Gravity, Urine: 1.012 (ref 1.005–1.030)
pH: 6 (ref 5.0–8.0)

## 2020-01-03 LAB — CBC
HCT: 42.3 % (ref 39.0–52.0)
Hemoglobin: 14.1 g/dL (ref 13.0–17.0)
MCH: 30.4 pg (ref 26.0–34.0)
MCHC: 33.3 g/dL (ref 30.0–36.0)
MCV: 91.2 fL (ref 80.0–100.0)
Platelets: 175 10*3/uL (ref 150–400)
RBC: 4.64 MIL/uL (ref 4.22–5.81)
RDW: 13.6 % (ref 11.5–15.5)
WBC: 5.8 10*3/uL (ref 4.0–10.5)
nRBC: 0 % (ref 0.0–0.2)

## 2020-01-03 LAB — SURGICAL PCR SCREEN
MRSA, PCR: NEGATIVE
Staphylococcus aureus: NEGATIVE

## 2020-01-03 NOTE — Telephone Encounter (Signed)
Note entered and faxed. 

## 2020-01-03 NOTE — Progress Notes (Signed)
Pt has personal care assistant (PCA) - pt is blind.   PCP - "Dr. Jenetta Downer" Jetty Duhamel, MD Cardiologist - pt deies   Chest x-ray - n/a EKG - 10/05/19 Stress Test - pt denies ECHO - pt denies Cardiac Cath - pt denies    Blood Thinner Instructions:n/a Aspirin Instructions:n/a  ERAS Protcol - yes PRE-SURGERY Ensure or G2- ensure  COVID TEST- 01/03/20   Anesthesia review: n/a  Patient denies shortness of breath, fever, cough and chest pain at PAT appointment   All instructions explained to the patient, with a verbal understanding of the material. Patient agrees to go over the instructions while at home for a better understanding. Patient also instructed to self quarantine after being tested for COVID-19. The opportunity to ask questions was provided.

## 2020-01-03 NOTE — Telephone Encounter (Signed)
Note entered. Could you please print, stamp, and fax for me? Thanks so much.

## 2020-01-03 NOTE — Telephone Encounter (Signed)
Faxed

## 2020-01-04 ENCOUNTER — Telehealth: Payer: Self-pay | Admitting: Orthopaedic Surgery

## 2020-01-04 MED ORDER — BUPIVACAINE LIPOSOME 1.3 % IJ SUSP
10.0000 mL | Freq: Once | INTRAMUSCULAR | Status: AC
  Administered 2020-01-07: 10 mL
  Filled 2020-01-04: qty 10

## 2020-01-04 NOTE — Telephone Encounter (Signed)
Form received fom [employer]. Sent to [facility].

## 2020-01-07 ENCOUNTER — Observation Stay (HOSPITAL_COMMUNITY)
Admission: RE | Admit: 2020-01-07 | Discharge: 2020-01-08 | Disposition: A | Discharge status: Home / Self Care | Payer: Medicare HMO | Attending: Orthopaedic Surgery | Admitting: Orthopaedic Surgery

## 2020-01-07 ENCOUNTER — Encounter (HOSPITAL_COMMUNITY): Payer: Self-pay | Admitting: Orthopaedic Surgery

## 2020-01-07 ENCOUNTER — Ambulatory Visit (HOSPITAL_COMMUNITY): Payer: Medicare HMO | Admitting: Anesthesiology

## 2020-01-07 ENCOUNTER — Ambulatory Visit (HOSPITAL_COMMUNITY): Payer: Medicare HMO

## 2020-01-07 ENCOUNTER — Encounter (HOSPITAL_COMMUNITY)
Admission: RE | Disposition: A | Discharge status: Home / Self Care | Payer: Self-pay | Source: Home / Self Care | Attending: Orthopaedic Surgery

## 2020-01-07 ENCOUNTER — Other Ambulatory Visit: Payer: Self-pay

## 2020-01-07 ENCOUNTER — Observation Stay (HOSPITAL_COMMUNITY): Payer: Medicare HMO

## 2020-01-07 DIAGNOSIS — Z09 Encounter for follow-up examination after completed treatment for conditions other than malignant neoplasm: Secondary | ICD-10-CM

## 2020-01-07 DIAGNOSIS — M87051 Idiopathic aseptic necrosis of right femur: Principal | ICD-10-CM | POA: Diagnosis present

## 2020-01-07 DIAGNOSIS — M167 Other unilateral secondary osteoarthritis of hip: Secondary | ICD-10-CM | POA: Insufficient documentation

## 2020-01-07 DIAGNOSIS — M1611 Unilateral primary osteoarthritis, right hip: Secondary | ICD-10-CM

## 2020-01-07 DIAGNOSIS — I10 Essential (primary) hypertension: Secondary | ICD-10-CM | POA: Diagnosis not present

## 2020-01-07 DIAGNOSIS — Z96642 Presence of left artificial hip joint: Secondary | ICD-10-CM | POA: Diagnosis not present

## 2020-01-07 DIAGNOSIS — F17290 Nicotine dependence, other tobacco product, uncomplicated: Secondary | ICD-10-CM | POA: Insufficient documentation

## 2020-01-07 DIAGNOSIS — Z419 Encounter for procedure for purposes other than remedying health state, unspecified: Secondary | ICD-10-CM

## 2020-01-07 DIAGNOSIS — M25551 Pain in right hip: Secondary | ICD-10-CM | POA: Diagnosis present

## 2020-01-07 HISTORY — PX: TOTAL HIP ARTHROPLASTY: SHX124

## 2020-01-07 SURGERY — ARTHROPLASTY, HIP, TOTAL, ANTERIOR APPROACH
Anesthesia: General | Site: Hip | Laterality: Right

## 2020-01-07 MED ORDER — PROPOFOL 10 MG/ML IV BOLUS
INTRAVENOUS | Status: AC
  Filled 2020-01-07: qty 20

## 2020-01-07 MED ORDER — ONDANSETRON HCL 4 MG PO TABS: 4.0000 mg | ORAL_TABLET | Freq: Four times a day (QID) | ORAL | Status: DC | PRN

## 2020-01-07 MED ORDER — ORAL CARE MOUTH RINSE: 15.0000 mL | Freq: Once | OROMUCOSAL | Status: AC

## 2020-01-07 MED ORDER — MENTHOL 3 MG MT LOZG: 1.0000 | LOZENGE | OROMUCOSAL | Status: DC | PRN

## 2020-01-07 MED ORDER — METHOCARBAMOL 500 MG PO TABS: 500.0000 mg | ORAL_TABLET | Freq: Four times a day (QID) | ORAL | 0 refills | Status: DC | PRN

## 2020-01-07 MED ORDER — EPHEDRINE SULFATE-NACL 50-0.9 MG/10ML-% IV SOSY
PREFILLED_SYRINGE | INTRAVENOUS | Status: DC | PRN
  Administered 2020-01-07: 5 mg via INTRAVENOUS

## 2020-01-07 MED ORDER — FENTANYL CITRATE (PF) 100 MCG/2ML IJ SOLN
INTRAMUSCULAR | Status: AC
  Filled 2020-01-07: qty 2

## 2020-01-07 MED ORDER — POLYETHYLENE GLYCOL 3350 17 G PO PACK: 17.0000 g | PACK | Freq: Every day | ORAL | Status: DC | PRN

## 2020-01-07 MED ORDER — OXYCODONE-ACETAMINOPHEN 5-325 MG PO TABS: 1.0000 | ORAL_TABLET | ORAL | 0 refills | Status: DC | PRN

## 2020-01-07 MED ORDER — FENTANYL CITRATE (PF) 250 MCG/5ML IJ SOLN
INTRAMUSCULAR | Status: DC | PRN
  Administered 2020-01-07 (×3): 50 ug via INTRAVENOUS
  Administered 2020-01-07: 100 ug via INTRAVENOUS

## 2020-01-07 MED ORDER — ROCURONIUM BROMIDE 10 MG/ML (PF) SYRINGE
PREFILLED_SYRINGE | INTRAVENOUS | Status: DC | PRN
  Administered 2020-01-07: 50 mg via INTRAVENOUS

## 2020-01-07 MED ORDER — HYDROMORPHONE HCL 1 MG/ML IJ SOLN
INTRAMUSCULAR | Status: AC
  Filled 2020-01-07: qty 1

## 2020-01-07 MED ORDER — ACETAMINOPHEN 325 MG PO TABS
325.0000 mg | ORAL_TABLET | Freq: Four times a day (QID) | ORAL | Status: DC | PRN
  Administered 2020-01-08: 650 mg via ORAL
  Filled 2020-01-07: qty 2

## 2020-01-07 MED ORDER — FENTANYL CITRATE (PF) 250 MCG/5ML IJ SOLN
INTRAMUSCULAR | Status: AC
  Filled 2020-01-07: qty 5

## 2020-01-07 MED ORDER — METHOCARBAMOL 1000 MG/10ML IJ SOLN
500.0000 mg | Freq: Four times a day (QID) | INTRAVENOUS | Status: DC | PRN
  Filled 2020-01-07: qty 5

## 2020-01-07 MED ORDER — BUPIVACAINE HCL (PF) 0.5 % IJ SOLN
INTRAMUSCULAR | Status: AC
  Filled 2020-01-07: qty 10

## 2020-01-07 MED ORDER — ASPIRIN EC 325 MG PO TBEC
325.0000 mg | DELAYED_RELEASE_TABLET | Freq: Every day | ORAL | 0 refills | Status: DC
Start: 2020-01-07 — End: 2020-08-22

## 2020-01-07 MED ORDER — TRANEXAMIC ACID-NACL 1000-0.7 MG/100ML-% IV SOLN
INTRAVENOUS | Status: DC | PRN
Start: 2020-01-07 — End: 2020-01-07
  Administered 2020-01-07: 1000 mg via INTRAVENOUS

## 2020-01-07 MED ORDER — CHLORHEXIDINE GLUCONATE 0.12 % MT SOLN
15.0000 mL | Freq: Once | OROMUCOSAL | Status: AC
  Administered 2020-01-07: 15 mL via OROMUCOSAL
  Filled 2020-01-07: qty 15

## 2020-01-07 MED ORDER — ONDANSETRON HCL 4 MG/2ML IJ SOLN: 4.0000 mg | Freq: Four times a day (QID) | INTRAMUSCULAR | Status: DC | PRN

## 2020-01-07 MED ORDER — OXYCODONE HCL 5 MG/5ML PO SOLN: 5.0000 mg | Freq: Once | ORAL | Status: AC | PRN

## 2020-01-07 MED ORDER — ONDANSETRON HCL 4 MG/2ML IJ SOLN
INTRAMUSCULAR | Status: DC | PRN
  Administered 2020-01-07: 4 mg via INTRAVENOUS

## 2020-01-07 MED ORDER — MIDAZOLAM HCL 2 MG/2ML IJ SOLN
INTRAMUSCULAR | Status: AC
  Filled 2020-01-07: qty 2

## 2020-01-07 MED ORDER — 0.9 % SODIUM CHLORIDE (POUR BTL) OPTIME
TOPICAL | Status: DC | PRN
  Administered 2020-01-07: 1000 mL

## 2020-01-07 MED ORDER — MIDAZOLAM HCL 5 MG/5ML IJ SOLN
INTRAMUSCULAR | Status: DC | PRN
  Administered 2020-01-07: 2 mg via INTRAVENOUS

## 2020-01-07 MED ORDER — METOCLOPRAMIDE HCL 5 MG/ML IJ SOLN: 5.0000 mg | Freq: Three times a day (TID) | INTRAMUSCULAR | Status: DC | PRN

## 2020-01-07 MED ORDER — HYDROMORPHONE HCL 1 MG/ML IJ SOLN
0.5000 mg | INTRAMUSCULAR | Status: DC | PRN
  Administered 2020-01-07 – 2020-01-08 (×3): 1 mg via INTRAVENOUS
  Filled 2020-01-07 (×3): qty 1

## 2020-01-07 MED ORDER — DOCUSATE SODIUM 100 MG PO CAPS
100.0000 mg | ORAL_CAPSULE | Freq: Two times a day (BID) | ORAL | Status: DC
  Administered 2020-01-07 – 2020-01-08 (×2): 100 mg via ORAL
  Filled 2020-01-07 (×2): qty 1

## 2020-01-07 MED ORDER — ASPIRIN EC 325 MG PO TBEC
325.0000 mg | DELAYED_RELEASE_TABLET | Freq: Every day | ORAL | Status: DC
  Administered 2020-01-08: 325 mg via ORAL
  Filled 2020-01-07: qty 1

## 2020-01-07 MED ORDER — PROPOFOL 10 MG/ML IV BOLUS
INTRAVENOUS | Status: DC | PRN
  Administered 2020-01-07: 140 mg via INTRAVENOUS

## 2020-01-07 MED ORDER — LIDOCAINE 2% (20 MG/ML) 5 ML SYRINGE
INTRAMUSCULAR | Status: DC | PRN
  Administered 2020-01-07: 80 mg via INTRAVENOUS

## 2020-01-07 MED ORDER — TRANEXAMIC ACID 1000 MG/10ML IV SOLN
2000.0000 mg | Freq: Once | INTRAVENOUS | Status: DC
  Filled 2020-01-07: qty 20

## 2020-01-07 MED ORDER — BUPIVACAINE HCL 0.25 % IJ SOLN
INTRAMUSCULAR | Status: DC | PRN
  Administered 2020-01-07: 10 mL

## 2020-01-07 MED ORDER — LACTATED RINGERS IV SOLN: INTRAVENOUS | Status: DC

## 2020-01-07 MED ORDER — SUGAMMADEX SODIUM 200 MG/2ML IV SOLN
INTRAVENOUS | Status: DC | PRN
  Administered 2020-01-07: 150 mg via INTRAVENOUS

## 2020-01-07 MED ORDER — CEFAZOLIN SODIUM-DEXTROSE 2-4 GM/100ML-% IV SOLN
2.0000 g | INTRAVENOUS | Status: AC
  Administered 2020-01-07: 2 g via INTRAVENOUS
  Filled 2020-01-07: qty 100

## 2020-01-07 MED ORDER — METOCLOPRAMIDE HCL 5 MG PO TABS: 5.0000 mg | ORAL_TABLET | Freq: Three times a day (TID) | ORAL | Status: DC | PRN

## 2020-01-07 MED ORDER — SODIUM CHLORIDE 0.9 % IV SOLN: INTRAVENOUS | Status: DC

## 2020-01-07 MED ORDER — OXYCODONE HCL 5 MG PO TABS
ORAL_TABLET | ORAL | Status: AC
  Filled 2020-01-07: qty 1

## 2020-01-07 MED ORDER — TRANEXAMIC ACID-NACL 1000-0.7 MG/100ML-% IV SOLN
INTRAVENOUS | Status: AC
  Filled 2020-01-07: qty 100

## 2020-01-07 MED ORDER — FOLIC ACID 1 MG PO TABS
1.0000 mg | ORAL_TABLET | Freq: Every day | ORAL | Status: DC
  Administered 2020-01-07 – 2020-01-08 (×2): 1 mg via ORAL
  Filled 2020-01-07 (×2): qty 1

## 2020-01-07 MED ORDER — DEXAMETHASONE SODIUM PHOSPHATE 10 MG/ML IJ SOLN
INTRAMUSCULAR | Status: DC | PRN
  Administered 2020-01-07: 10 mg via INTRAVENOUS

## 2020-01-07 MED ORDER — METHOCARBAMOL 500 MG PO TABS
500.0000 mg | ORAL_TABLET | Freq: Four times a day (QID) | ORAL | Status: DC | PRN
  Administered 2020-01-07 – 2020-01-08 (×5): 500 mg via ORAL
  Filled 2020-01-07 (×4): qty 1

## 2020-01-07 MED ORDER — ACETAMINOPHEN 500 MG PO TABS
1000.0000 mg | ORAL_TABLET | Freq: Once | ORAL | Status: AC
  Administered 2020-01-07: 1000 mg via ORAL
  Filled 2020-01-07: qty 2

## 2020-01-07 MED ORDER — OXYCODONE HCL 5 MG PO TABS
5.0000 mg | ORAL_TABLET | ORAL | Status: DC | PRN
  Administered 2020-01-07 – 2020-01-08 (×4): 5 mg via ORAL
  Filled 2020-01-07 (×4): qty 1

## 2020-01-07 MED ORDER — FENTANYL CITRATE (PF) 100 MCG/2ML IJ SOLN
25.0000 ug | INTRAMUSCULAR | Status: DC | PRN
  Administered 2020-01-07 (×3): 50 ug via INTRAVENOUS

## 2020-01-07 MED ORDER — ZINC SULFATE 220 (50 ZN) MG PO CAPS
220.0000 mg | ORAL_CAPSULE | Freq: Every day | ORAL | Status: DC
  Administered 2020-01-07 – 2020-01-08 (×2): 220 mg via ORAL
  Filled 2020-01-07 (×2): qty 1

## 2020-01-07 MED ORDER — METHOCARBAMOL 500 MG PO TABS
ORAL_TABLET | ORAL | Status: AC
  Filled 2020-01-07: qty 1

## 2020-01-07 MED ORDER — OXYCODONE HCL 5 MG PO TABS
5.0000 mg | ORAL_TABLET | Freq: Once | ORAL | Status: AC | PRN
  Administered 2020-01-07: 5 mg via ORAL

## 2020-01-07 MED ORDER — PHENOL 1.4 % MT LIQD: 1.0000 | OROMUCOSAL | Status: DC | PRN

## 2020-01-07 MED ORDER — PROMETHAZINE HCL 25 MG/ML IJ SOLN: 6.2500 mg | INTRAMUSCULAR | Status: DC | PRN

## 2020-01-07 SURGICAL SUPPLY — 60 items
APL SKNCLS STERI-STRIP NONHPOA (GAUZE/BANDAGES/DRESSINGS) ×1
BENZOIN TINCTURE PRP APPL 2/3 (GAUZE/BANDAGES/DRESSINGS) ×3 IMPLANT
BLADE CLIPPER SURG (BLADE) IMPLANT
BLADE SAW SGTL 18X1.27X75 (BLADE) ×2 IMPLANT
BLADE SAW SGTL 18X1.27X75MM (BLADE) ×1
CELLS DAT CNTRL 66122 CELL SVR (MISCELLANEOUS) ×1 IMPLANT
CLOSURE WOUND 1/2 X4 (GAUZE/BANDAGES/DRESSINGS) ×1
COVER SURGICAL LIGHT HANDLE (MISCELLANEOUS) ×3 IMPLANT
COVER WAND RF STERILE (DRAPES) ×3 IMPLANT
CUP SECTOR GRIPTON 50MM (Cup) ×3 IMPLANT
DRAPE C-ARM 42X72 X-RAY (DRAPES) ×3 IMPLANT
DRAPE IMP U-DRAPE 54X76 (DRAPES) ×3 IMPLANT
DRAPE STERI IOBAN 125X83 (DRAPES) ×3 IMPLANT
DRAPE U-SHAPE 47X51 STRL (DRAPES) ×9 IMPLANT
DRSG MEPILEX BORDER 4X8 (GAUZE/BANDAGES/DRESSINGS) ×3 IMPLANT
DURAPREP 26ML APPLICATOR (WOUND CARE) ×3 IMPLANT
ELECT BLADE 4.0 EZ CLEAN MEGAD (MISCELLANEOUS)
ELECT CAUTERY BLADE 6.4 (BLADE) ×3 IMPLANT
ELECT REM PT RETURN 9FT ADLT (ELECTROSURGICAL) ×3
ELECTRODE BLDE 4.0 EZ CLN MEGD (MISCELLANEOUS) IMPLANT
ELECTRODE REM PT RTRN 9FT ADLT (ELECTROSURGICAL) ×1 IMPLANT
ELIMINATOR HOLE APEX DEPUY (Hips) ×3 IMPLANT
FACESHIELD WRAPAROUND (MASK) ×6 IMPLANT
GLOVE BIOGEL PI IND STRL 8 (GLOVE) ×2 IMPLANT
GLOVE BIOGEL PI INDICATOR 8 (GLOVE) ×4
GLOVE ORTHO TXT STRL SZ7.5 (GLOVE) ×6 IMPLANT
GOWN STRL REUS W/ TWL LRG LVL3 (GOWN DISPOSABLE) ×1 IMPLANT
GOWN STRL REUS W/ TWL XL LVL3 (GOWN DISPOSABLE) ×1 IMPLANT
GOWN STRL REUS W/TWL 2XL LVL3 (GOWN DISPOSABLE) ×3 IMPLANT
GOWN STRL REUS W/TWL LRG LVL3 (GOWN DISPOSABLE) ×3
GOWN STRL REUS W/TWL XL LVL3 (GOWN DISPOSABLE) ×3
HEAD FEMORAL 32 CERAMIC (Hips) ×3 IMPLANT
KIT BASIN OR (CUSTOM PROCEDURE TRAY) ×3 IMPLANT
KIT TURNOVER KIT B (KITS) ×3 IMPLANT
LINER ACET PNNCL PLUS4 NEUTRAL (Hips) ×1 IMPLANT
MANIFOLD NEPTUNE II (INSTRUMENTS) ×3 IMPLANT
NEEDLE 22X1 1/2 (OR ONLY) (NEEDLE) ×3 IMPLANT
NS IRRIG 1000ML POUR BTL (IV SOLUTION) ×3 IMPLANT
PACK TOTAL JOINT (CUSTOM PROCEDURE TRAY) ×3 IMPLANT
PAD ARMBOARD 7.5X6 YLW CONV (MISCELLANEOUS) ×6 IMPLANT
PINNACLE PLUS 4 NEUTRAL (Hips) ×3 IMPLANT
RTRCTR WOUND ALEXIS 18CM MED (MISCELLANEOUS) ×3
STAPLER VISISTAT (STAPLE) IMPLANT
STAPLER VISISTAT 35W (STAPLE) ×3 IMPLANT
STEM FEM SZ3 STD ACTIS (Stem) ×3 IMPLANT
STRIP CLOSURE SKIN 1/2X4 (GAUZE/BANDAGES/DRESSINGS) ×2 IMPLANT
SUT VIC AB 0 CT1 27 (SUTURE) ×3
SUT VIC AB 0 CT1 27XBRD ANBCTR (SUTURE) ×1 IMPLANT
SUT VIC AB 2-0 CT1 27 (SUTURE) ×3
SUT VIC AB 2-0 CT1 TAPERPNT 27 (SUTURE) ×1 IMPLANT
SUT VICRYL 4-0 PS2 18IN ABS (SUTURE) ×3 IMPLANT
SUT VLOC 180 0 24IN GS25 (SUTURE) ×3 IMPLANT
SUT VLOC 180 0 9IN  GS21 (SUTURE)
SUT VLOC 180 0 9IN GS21 (SUTURE) IMPLANT
SYR CONTROL 10ML LL (SYRINGE) ×3 IMPLANT
TOWEL GREEN STERILE (TOWEL DISPOSABLE) ×6 IMPLANT
TOWEL GREEN STERILE FF (TOWEL DISPOSABLE) ×3 IMPLANT
TRAY CATH 16FR W/PLASTIC CATH (SET/KITS/TRAYS/PACK) IMPLANT
TRAY FOLEY MTR SLVR 16FR STAT (SET/KITS/TRAYS/PACK) IMPLANT
WATER STERILE IRR 1000ML POUR (IV SOLUTION) ×6 IMPLANT

## 2020-01-07 NOTE — Anesthesia Postprocedure Evaluation (Signed)
Anesthesia Post Note  Patient: Austin [name]  Procedure(s) Performed: RIGHT TOTAL HIP ARTHROPLASTY DIRECT ANTERIOR (Right Hip)     Patient location during evaluation: PACU Anesthesia Type: General Level of consciousness: awake and alert and oriented Pain management: pain level controlled Vital Signs Assessment: post-procedure vital signs reviewed and stable Respiratory status: spontaneous breathing, nonlabored ventilation and respiratory function stable Cardiovascular status: blood pressure returned to baseline Postop Assessment: no apparent nausea or vomiting Anesthetic complications: no   No complications documented.  Last Vitals:  Vitals:   01/07/20 1507 01/07/20 1522  BP: (!) 153/76 132/79  Pulse: 70 (!) 58  Resp: 19 10  Temp:    SpO2: 100% 100%         RLE Motor Response: Purposeful movement;Responds to commands (01/07/20 1522) RLE Sensation: Full sensation (01/07/20 1522)      Brennan Bailey

## 2020-01-07 NOTE — Interval H&P Note (Signed)
History and Physical Interval Note:  01/07/2020 12:29 PM  Austin [name]  has presented today for surgery, with the diagnosis of right hip avascular necrosis.  The various methods of treatment have been discussed with the patient and family. After consideration of risks, benefits and other options for treatment, the patient has consented to  Procedure(s): RIGHT TOTAL HIP ARTHROPLASTY DIRECT ANTERIOR (Right) as a surgical intervention.  The patient's history has been reviewed, patient examined, no change in status, stable for surgery.  I have reviewed the patient's chart and labs.  Questions were answered to the patient's satisfaction.     [name]

## 2020-01-07 NOTE — Progress Notes (Signed)
PT Cancellation Note  Patient Details Name: Austin [name] MRN: [id number] DOB: [birth year]   Cancelled Treatment:    Reason Eval/Treat Not Completed: Other (comment) Pt orders released for PT, however, not in room. Will follow up as schedule allows and as pt gets up to room.   Lou Miner, DPT  Acute Rehabilitation Services  Pager: [phone] Office: [phone]    Rudean Hitt 01/07/2020, 5:53 PM

## 2020-01-07 NOTE — H&P (Signed)
TOTAL HIP ADMISSION H&P  Patient is admitted for right total hip arthroplasty.  Subjective:  Chief Complaint: right hip pain  HPI: Austin [name], 54 y.o. male, has a history of pain and functional disability in the right hip(s) due to arthritis and patient has failed non-surgical conservative treatments for greater than 12 weeks to include NSAID's and/or analgesics, use of assistive devices and activity modification.  Onset of symptoms was gradual starting 10 years ago with gradually worsening course since that time.The patient noted no past surgery on the right hip(s).  Patient currently rates pain in the right hip at 10 out of 10 with activity. Patient has night pain, worsening of pain with activity and weight bearing, trendelenberg gait, pain that interfers with activities of daily living, pain with passive range of motion, crepitus and joint swelling. Patient has evidence of subchondral cysts, subchondral sclerosis, periarticular osteophytes and joint space narrowing by imaging studies. This condition presents safety issues increasing the risk of falls. This patient has had avascular necrosis of the hip, acetabular fracture, hip dysplasia.  There is no current active infection.  Patient Active Problem List   Diagnosis Date Noted  . Acute pancreatitis 10/05/2019  . 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 Intractable nausea and vomiting 12/29/2018  . 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 Essential hypertension 04/14/2018  . Blindness of both eyes 04/14/2018  . Alcohol use 04/14/2018  . Hyponatremia 04/14/2018  . RUQ pain   . Abnormal CT of the abdomen   . 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 Retained orthopedic hardware 08/07/2011   Past Medical History:  Diagnosis Date  . Alcohol abuse   . Anxiety   . Arthritis   . 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 Elevated LFTs   . Foot and toe(s), blister    bilat feet, blistering between toes  . GERD  (gastroesophageal reflux disease)   . Glaucoma   . Glaucoma as birth trauma   . Headache(784.0)   . Hypertension    hx of  . Insomnia   . Pancreatitis   . UTI (lower urinary tract infection) 08/04/11   finishing abx tomorrow  . Weight loss    25 lb wt loss since 08/2010  . Wrist fracture, right     Past Surgical History:  Procedure Laterality Date  . COLONOSCOPY  2006?  . ESOPHAGOGASTRODUODENOSCOPY    . ESOPHAGOGASTRODUODENOSCOPY (EGD) WITH PROPOFOL N/A 05/09/2017   Procedure: ESOPHAGOGASTRODUODENOSCOPY (EGD) WITH PROPOFOL;  Surgeon: Doran Stabler, MD;  Location: [facility];  Service: Gastroenterology;  Laterality: N/A;  . EYE SURGERY Left   . EYE SURGERY Right    prostetic eye  . foot srugery     removal of bone near small toe  . FRACTURE SURGERY Right    wrist  . HARDWARE REMOVAL  08/06/2011   Procedure: HARDWARE REMOVAL;  Surgeon: Newt Minion, MD;  Location: [facility];  Service: Orthopedics;  Laterality: Right;  Removal Deep Retained Hardware Right Distal Radius  . MULTIPLE TOOTH EXTRACTIONS     for dentures  . TOTAL HIP ARTHROPLASTY Left 06/06/2019   Procedure: LEFT TOTAL HIP ARTHROPLASTY-DIRECT ANTERIOR;  Surgeon: [name], MD;  Location: [facility];  Service: Orthopedics;  Laterality: Left;    Current Facility-Administered Medications  Medication Dose Route Frequency Provider Last Rate Last Admin  . bupivacaine liposome (EXPAREL) 1.3 % injection 133 mg  10 mL Infiltration Once [name], MD  Current Outpatient Medications  Medication Sig Dispense Refill Last Dose  . ascorbic acid (VITAMIN C) 500 MG tablet Take 500 mg by mouth daily.     . folic acid (FOLVITE) 1 MG tablet Take 1 mg by mouth daily.     . ondansetron (ZOFRAN) 4 MG tablet Take 4 mg by mouth every 8 (eight) hours as needed for nausea or vomiting.      Marland Kitchen OVER THE COUNTER MEDICATION Take 2 tablets by mouth daily. Sea moss otc supplement     . vitamin E 180 MG (400 UNITS) capsule Take 400 Units by  mouth daily.     . Zinc Sulfate (ZINC 15 PO) Take 30 mg by mouth daily.     . Na Sulfate-K Sulfate-Mg Sulf 17.5-3.13-1.6 GM/177ML SOLN Suprep (no substitutions)-TAKE AS DIRECTED. 354 mL 0   . VITAMIN D PO Take 1 tablet by mouth daily.       No Known Allergies  Social History   Tobacco Use  . Smoking status: Current Every Day Smoker    Packs/day: 0.25    Years: 17.00    Pack years: 4.25    Types: Cigars  . Smokeless tobacco: Never Used  . Tobacco comment: 3 cigars a day   Substance Use Topics  . Alcohol use: Yes    Comment: 1 pint a day of liquor per pt    Family History  Problem Relation Age of Onset  . Diabetes Sister   . Diabetes Maternal Aunt        aunt and uncles  . Hypertension Sister        aunt and uncles maternal side  . Colon cancer Neg Hx   . Colon polyps Neg Hx   . Esophageal cancer Neg Hx   . Rectal cancer Neg Hx   . Stomach cancer Neg Hx      Review of Systems  Constitutional: Positive for activity change.  HENT: Negative.   Eyes: Positive for visual disturbance.  Respiratory: Negative.   Cardiovascular: Negative.   Gastrointestinal: Negative.   Genitourinary: Negative.   Musculoskeletal: Positive for gait problem.  Psychiatric/Behavioral: Negative.     Objective:  Physical Exam HENT:     Head: Normocephalic and atraumatic.     Nose: Nose normal.  Cardiovascular:     Rate and Rhythm: Regular rhythm.     Heart sounds: Normal heart sounds.  Pulmonary:     Effort: Pulmonary effort is normal. No respiratory distress.     Breath sounds: Normal breath sounds.  Abdominal:     General: Bowel sounds are normal. There is no distension.  Musculoskeletal:        General: Tenderness present.     Cervical back: Normal range of motion.  Skin:    General: Skin is warm and dry.  Neurological:     General: No focal deficit present.     Mental Status: He is alert and oriented to person, place, and time.  Psychiatric:        Mood and Affect: Mood normal.      Vital signs in last 24 hours:    Labs:   Estimated body mass index is 24.21 kg/m as calculated from the following:   Height as of 01/03/20: 5\' 7"  (1.702 m).   Weight as of 01/03/20: 70.1 kg.   Imaging Review Plain radiographs demonstrate moderate degenerative joint disease of the right hip(s). The bone quality appears to be good for age and reported activity level.  Assessment/Plan:  End stage arthritis, right hip(s)  The patient history, physical examination, clinical judgement of the provider and imaging studies are consistent with end stage degenerative joint disease of the right hip(s) and total hip arthroplasty is deemed medically necessary. The treatment options including medical management, injection therapy, arthroscopy and arthroplasty were discussed at length. The risks and benefits of total hip arthroplasty were presented and reviewed. The risks due to aseptic loosening, infection, stiffness, dislocation/subluxation,  thromboembolic complications and other imponderables were discussed.  The patient acknowledged the explanation, agreed to proceed with the plan and consent was signed. Patient is being admitted for inpatient treatment for surgery, pain control, PT, OT, prophylactic antibiotics, VTE prophylaxis, progressive ambulation and ADL's and discharge planning.The patient is planning to be discharged home with home health services

## 2020-01-07 NOTE — Transfer of Care (Signed)
Immediate Anesthesia Transfer of Care Note  Patient: Austin [name]  Procedure(s) Performed: RIGHT TOTAL HIP ARTHROPLASTY DIRECT ANTERIOR (Right Hip)  Patient Location: PACU  Anesthesia Type:General  Level of Consciousness: awake, alert  and oriented  Airway & Oxygen Therapy: Patient Spontanous Breathing and Patient connected to face mask oxygen  Post-op Assessment: Report given to RN and Post -op Vital signs reviewed and stable  Post vital signs: Reviewed and stable  Last Vitals:  Vitals Value Taken Time  BP 122/72 01/07/20 1422  Temp    Pulse 59 01/07/20 1426  Resp 20 01/07/20 1426  SpO2 100 % 01/07/20 1426  Vitals shown include unvalidated device data.  Last Pain:  Vitals:   01/07/20 1127  TempSrc:   PainSc: 7       Patients Stated Pain Goal: 3 (15/80/63 8685)  Complications: No complications documented.

## 2020-01-07 NOTE — Discharge Instructions (Addendum)
INSTRUCTIONS AFTER JOINT REPLACEMENT   o Remove items at home which could result in a fall. This includes throw rugs or furniture in walking pathways o ICE to the affected joint every three hours while awake for 30 minutes at a time, for at least the first 3-5 days, and then as needed for pain and swelling.  Continue to use ice for pain and swelling. You may notice swelling that will progress down to the foot and ankle.  This is normal after surgery.  Elevate your leg when you are not up walking on it.   o Continue to use the breathing machine you got in the hospital (incentive spirometer) which will help keep your temperature down.  It is common for your temperature to cycle up and down following surgery, especially at night when you are not up moving around and exerting yourself.  The breathing machine keeps your lungs expanded and your temperature down.   DIET:  As you were doing prior to hospitalization, we recommend a well-balanced diet.  DRESSING / WOUND CARE / SHOWERING  Okay to shower when you get home, leave dressing on until you return to office in one week.    ACTIVITY  o Increase activity slowly as tolerated, but follow the weight bearing instructions below.   o No driving for 6 weeks or until further direction given by your physician.  You cannot drive while taking narcotics.  o No lifting or carrying greater than 10 lbs. until further directed by your surgeon. o Avoid periods of inactivity such as sitting longer than an hour when not asleep. This helps prevent blood clots.  o You may return to work once you are authorized by your doctor.     WEIGHT BEARING   Weight bearing as tolerated with assist device (walker, cane, etc) as directed, use it as long as suggested by your surgeon or therapist, typically at least 4-6 weeks.   EXERCISES  Results after joint replacement surgery are often greatly improved when you follow the exercise, range of motion and muscle strengthening  exercises prescribed by your doctor. Safety measures are also important to protect the joint from further injury. Any time any of these exercises cause you to have increased pain or swelling, decrease what you are doing until you are comfortable again and then slowly increase them. If you have problems or questions, call your caregiver or physical therapist for advice.   Rehabilitation is important following a joint replacement. After just a few days of immobilization, the muscles of the leg can become weakened and shrink (atrophy).  These exercises are designed to build up the tone and strength of the thigh and leg muscles and to improve motion. Often times heat used for twenty to thirty minutes before working out will loosen up your tissues and help with improving the range of motion but do not use heat for the first two weeks following surgery (sometimes heat can increase post-operative swelling).   These exercises can be done on a training (exercise) mat, on the floor, on a table or on a bed. Use whatever works the best and is most comfortable for you.    Use music or television while you are exercising so that the exercises are a pleasant break in your day. This will make your life better with the exercises acting as a break in your routine that you can look forward to.   Perform all exercises about fifteen times, three times per day or as directed.  You  should exercise both the operative leg and the other leg as well.  Exercises include:   . Quad Sets - Tighten up the muscle on the front of the thigh (Quad) and hold for 5-10 seconds.   . Straight Leg Raises - With your knee straight (if you were given a brace, keep it on), lift the leg to 60 degrees, hold for 3 seconds, and slowly lower the leg.  Perform this exercise against resistance later as your leg gets stronger.  . Leg Slides: Lying on your back, slowly slide your foot toward your buttocks, bending your knee up off the floor (only go as far as  is comfortable). Then slowly slide your foot back down until your leg is flat on the floor again.  Glenard Haring Wings: Lying on your back spread your legs to the side as far apart as you can without causing discomfort.  . Hamstring Strength:  Lying on your back, push your heel against the floor with your leg straight by tightening up the muscles of your buttocks.  Repeat, but this time bend your knee to a comfortable angle, and push your heel against the floor.  You may put a pillow under the heel to make it more comfortable if necessary.   A rehabilitation program following joint replacement surgery can speed recovery and prevent re-injury in the future due to weakened muscles. Contact your doctor or a physical therapist for more information on knee rehabilitation.    CONSTIPATION  Constipation is defined medically as fewer than three stools per week and severe constipation as less than one stool per week.  Even if you have a regular bowel pattern at home, your normal regimen is likely to be disrupted due to multiple reasons following surgery.  Combination of anesthesia, postoperative narcotics, change in appetite and fluid intake all can affect your bowels.   YOU MUST use at least one of the following options; they are listed in order of increasing strength to get the job done.  They are all available over the counter, and you may need to use some, POSSIBLY even all of these options:    Drink plenty of fluids (prune juice may be helpful) and high fiber foods Colace 100 mg by mouth twice a day  Senokot for constipation as directed and as needed Dulcolax (bisacodyl), take with full glass of water  Miralax (polyethylene glycol) once or twice a day as needed.  If you have tried all these things and are unable to have a bowel movement in the first 3-4 days after surgery call either your surgeon or your primary doctor.    If you experience loose stools or diarrhea, hold the medications until you stool  forms back up.  If your symptoms do not get better within 1 week or if they get worse, check with your doctor.  If you experience "the worst abdominal pain ever" or develop nausea or vomiting, please contact the office immediately for further recommendations for treatment.   ITCHING:  If you experience itching with your medications, try taking only a single pain pill, or even half a pain pill at a time.  You can also use Benadryl over the counter for itching or also to help with sleep.   TED HOSE STOCKINGS:  Use stockings on both legs until for at least 2 weeks or as directed by physician office. They may be removed at night for sleeping.  MEDICATIONS:  See your medication summary on the "After Visit Summary" that nursing  will review with you.  You may have some home medications which will be placed on hold until you complete the course of blood thinner medication.  It is important for you to complete the blood thinner medication as prescribed.  PRECAUTIONS:  If you experience chest pain or shortness of breath - call 911 immediately for transfer to the hospital emergency department.   If you develop a fever greater that 101 F, purulent drainage from wound, increased redness or drainage from wound, foul odor from the wound/dressing, or calf pain - CONTACT YOUR SURGEON.                                                   FOLLOW-UP APPOINTMENTS:  If you do not already have a post-op appointment, please call the office for an appointment to be seen by your surgeon.  Guidelines for how soon to be seen are listed in your "After Visit Summary", but are typically between 1-4 weeks after surgery.  OTHER INSTRUCTIONS:   Knee Replacement:  Do not place pillow under knee, focus on keeping the knee straight while resting. CPM instructions: 0-90 degrees, 2 hours in the morning, 2 hours in the afternoon, and 2 hours in the evening. Place foam block, curve side up under heel at all times except when in CPM or when  walking.  DO NOT modify, tear, cut, or change the foam block in any way.   DENTAL ANTIBIOTICS:  In most cases prophylactic antibiotics for Dental procdeures after total joint surgery are not necessary.  Exceptions are as follows:  1. History of prior total joint infection  2. Severely immunocompromised (Organ Transplant, cancer chemotherapy, Rheumatoid biologic meds such as [place])  3. Poorly controlled diabetes (A1C &gt; 8.0, blood glucose over 200)  If you have one of these conditions, contact your surgeon for an antibiotic prescription, prior to your dental procedure.   MAKE SURE YOU:  . Understand these instructions.  . Get help right away if you are not doing well or get worse.    Thank you for letting us be a part of your medical care team.  It is a privilege we respect greatly.  We hope these instructions will help you stay on track for a fast and full recovery!     Dental Antibiotics:  In most cases prophylactic antibiotics for Dental procdeures after total joint surgery are not necessary.  Exceptions are as follows:  1. History of prior total joint infection  2. Severely immunocompromised (Organ Transplant, cancer chemotherapy, Rheumatoid biologic meds such as [place])  3. Poorly controlled diabetes (A1C &gt; 8.0, blood glucose over 200)  If you have one of these conditions, contact your surgeon for an antibiotic prescription, prior to your dental procedure.

## 2020-01-07 NOTE — Anesthesia Preprocedure Evaluation (Addendum)
Anesthesia Evaluation  Patient identified by MRN, date of birth, ID band Patient awake    Reviewed: Allergy & Precautions, NPO status , Patient's Chart, lab work & pertinent test results  History of Anesthesia Complications Negative for: history of anesthetic complications  Airway Mallampati: II  TM Distance: >3 FB Neck ROM: Full    Dental  (+) Edentulous Upper, Edentulous Lower   Pulmonary Current Smoker,    Pulmonary exam normal        Cardiovascular hypertension, Normal cardiovascular exam     Neuro/Psych Anxiety negative neurological ROS     GI/Hepatic GERD  Controlled,(+)     substance abuse  marijuana use,   Endo/Other  negative endocrine ROS  Renal/GU negative Renal ROS  negative genitourinary   Musculoskeletal  (+) Arthritis , right hip avascular necrosis   Abdominal   Peds  Hematology negative hematology ROS (+)   Anesthesia Other Findings Blind  Reproductive/Obstetrics negative OB ROS                            Anesthesia Physical Anesthesia Plan  ASA: III  Anesthesia Plan: General   Post-op Pain Management:    Induction: Intravenous  PONV Risk Score and Plan: 2 and Treatment may vary due to age or medical condition, Ondansetron, Dexamethasone and Midazolam  Airway Management Planned: Oral ETT  Additional Equipment: None  Intra-op Plan:   Post-operative Plan: Extubation in OR  Informed Consent: I have reviewed the patients History and Physical, chart, labs and discussed the procedure including the risks, benefits and alternatives for the proposed anesthesia with the patient or authorized representative who has indicated his/her understanding and acceptance.     Dental advisory given  Plan Discussed with: CRNA  Anesthesia Plan Comments:        Anesthesia Quick Evaluation

## 2020-01-07 NOTE — Op Note (Signed)
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  Postop diagnosis: Same  Procedure: Right total of arthroplasty, direct anterior approach.  Surgeon Rodell Perna, MD  Assistant Benjiman Ting, PA-C medically necessary and present for the entire procedure  Anesthesia spinal +10 cc Marcaine and 10 cc Exparel at end of case.  Implants scription 50 mm cup +4 neutral liner, apex hole eliminator.  Size 3 Actis press-fit stem.  32 x 1 mm neck length ceramic ball.  Procedure: After induction of general anesthesia TXA Hana boot application Foley catheter placed patient was placed on the Hana table careful padding and positioning C-arm was brought in with good visualization of both hips due to the patient thin size.  DuraPrep was used usual total hip sheets sterile skin marker Betadine large sharp curtain Steri-Drape and hydraulic arm was applied after cutting on the drape and sealing it at the base with strips of Betadine Steri-Drape.  Half sheet across half sheet on the other side secured.  Timeout procedure completed Ancef prophylaxis.  Incision was made 1 fingerbreadth lateral 1 inferior to the ASIS obliquely to the trochanter.  Fascia was then nicked and extended medially underneath over the top of the muscle underneath the fascia and dull cobra was placed.  Hips laterally and transverse arteries were coagulated there was thin layer of  adipose tissue and then capsule was opened.  Delker was placed inside the capsule capsule was removed off the neck and the neck was cut under C-arm visualization 11 mm neck length identical to the opposite side.  Cut was completed near the trochanter with an osteotome head was moved with a corkscrew.  Sequential reaming up to 49 placement of a 50 mm cup.  Dome screws were available but the cup was secure and it was not needed.  Apex illuminator was inserted in the middle secondary impaction and then placement of the polysecured dialed down in rotation  and impacted and then tested and was secure.  Hydraulic hook applied.  Hip was externally rotated out to 120 degrees taken down and under need the opposite leg and then Mueller retractor was placed large trochanteric clamp placed.  Cookie cutter lateralizer rondure cup curettes for lateralization and then progressing up to a size 3 trial which gave excellent tight fit.  +1 mm neck length was stable hip could be externally rotated 90 degrees and taken halfway down the floor and extension with no subluxation.  There was trace shuck leg lengths were identical on x-ray measurements.  Trials on the femoral side were removed permanent #3 Actis stem was inserted with the +1 mm ceramic ball.  Impacted checked secured hip reduced identical stability AP lateral x-rays of the femur and then single spot x-ray showing both hips showing leg length measurements were equal.  Recoagulation the transverse bleeders was performed V lock closure of the fascia 2-0 Vicryl in the subcutaneous tissue skin staple closure postop dressing and transferred recovery room.

## 2020-01-07 NOTE — Anesthesia Procedure Notes (Signed)
Procedure Name: Intubation Date/Time: 01/07/2020 12:40 PM Performed by: Griffin Dakin, CRNA Pre-anesthesia Checklist: Patient identified, Emergency Drugs available, Suction available and Patient being monitored Patient Re-evaluated:Patient Re-evaluated prior to induction Oxygen Delivery Method: Circle system utilized Preoxygenation: Pre-oxygenation with 100% oxygen Induction Type: IV induction Ventilation: Mask ventilation without difficulty Laryngoscope Size: Mac and 4 Grade View: Grade II Tube type: Oral Tube size: 7.5 mm Number of attempts: 1 Airway Equipment and Method: Stylet and Oral airway Placement Confirmation: ETT inserted through vocal cords under direct vision,  positive ETCO2 and breath sounds checked- equal and bilateral Secured at: 23 cm Tube secured with: Tape Dental Injury: Teeth and Oropharynx as per pre-operative assessment

## 2020-01-08 ENCOUNTER — Encounter (HOSPITAL_COMMUNITY): Payer: Self-pay | Admitting: Orthopaedic Surgery

## 2020-01-08 ENCOUNTER — Telehealth: Payer: Self-pay | Admitting: Orthopaedic Surgery

## 2020-01-08 DIAGNOSIS — M87051 Idiopathic aseptic necrosis of right femur: Secondary | ICD-10-CM | POA: Diagnosis not present

## 2020-01-08 LAB — BASIC METABOLIC PANEL
Anion gap: 8 (ref 5–15)
BUN: 9 mg/dL (ref 6–20)
CO2: 26 mmol/L (ref 22–32)
Calcium: 8.7 mg/dL — ABNORMAL LOW (ref 8.9–10.3)
Chloride: 97 mmol/L — ABNORMAL LOW (ref 98–111)
Creatinine, Ser: 1.08 mg/dL (ref 0.61–1.24)
GFR calc Af Amer: >60 mL/min (ref >60)
GFR calc non Af Amer: >60 mL/min (ref >60)
Glucose, Bld: 165 mg/dL — ABNORMAL HIGH (ref 70–99)
Potassium: 4.5 mmol/L (ref 3.5–5.1)
Sodium: 131 mmol/L — ABNORMAL LOW (ref 135–145)

## 2020-01-08 LAB — CBC
HCT: 39.1 % (ref 39.0–52.0)
Hemoglobin: 13.4 g/dL (ref 13.0–17.0)
MCH: 30.9 pg (ref 26.0–34.0)
MCHC: 34.3 g/dL (ref 30.0–36.0)
MCV: 90.3 fL (ref 80.0–100.0)
Platelets: 166 10*3/uL (ref 150–400)
RBC: 4.33 MIL/uL (ref 4.22–5.81)
RDW: 13.3 % (ref 11.5–15.5)
WBC: 9.7 10*3/uL (ref 4.0–10.5)
nRBC: 0 % (ref 0.0–0.2)

## 2020-01-08 NOTE — Plan of Care (Signed)

## 2020-01-08 NOTE — Progress Notes (Signed)
Discharge instructions provided to the patient and his brother.  They both verbalize understanding instructions.  The patient was notified that Dr Lorin Mercy did not order [place] for discharge.  The patient will be discharged to his home.

## 2020-01-08 NOTE — Telephone Encounter (Signed)
Pt would like for Dr.Yates to sign off on his discharge papers so that he can catch a ride back home ([place]) today. Pt states he's been waiting since 10 am  [phone]

## 2020-01-08 NOTE — Telephone Encounter (Signed)
Did it this AM. Pt ready to go. I called nurse and she will roll him off the floor when his ride it at hospital

## 2020-01-08 NOTE — Progress Notes (Signed)
   Subjective: 1 Day Post-Op Procedure(s) (LRB): RIGHT TOTAL HIP ARTHROPLASTY DIRECT ANTERIOR (Right) Patient reports pain as mild.  " I can't sleep here at the hospital"  Objective: Vital signs in last 24 hours: Temp:  [97 F (36.1 C)-98.4 F (36.9 C)] 98.3 F (36.8 C) (08/03 0402) Pulse Rate:  [56-70] 64 (08/03 0402) Resp:  [10-20] 17 (08/03 0402) BP: (120-153)/(72-89) 141/82 (08/03 0402) SpO2:  [90 %-100 %] 99 % (08/03 0402) Weight:  [70.1 kg] 70.1 kg (08/02 1058)  Intake/Output from previous day: 08/02 0701 - 08/03 0700 In: 752.2 [P.O.:240; I.V.:112.2; IV Piggyback:100] Out: 2950 [Urine:2750; Blood:200] Intake/Output this shift: No intake/output data recorded.  Recent Labs    01/08/20 0245  HGB 13.4   Recent Labs    01/08/20 0245  WBC 9.7  RBC 4.33  HCT 39.1  PLT 166   Recent Labs    01/08/20 0245  NA 131*  K 4.5  CL 97*  CO2 26  BUN 9  CREATININE 1.08  GLUCOSE 165*  CALCIUM 8.7*   No results for input(s): LABPT, INR in the last 72 hours.  Neurologically intact leg length equal DG C-Arm 1-60 Min  Result Date: 01/07/2020 CLINICAL DATA:  Right hip replacement EXAM: OPERATIVE RIGHT HIP WITH PELVIS; DG C-ARM 1-60 MIN COMPARISON:  12/20/2019 FLUOROSCOPY TIME:  Fluoroscopy Time:  18 seconds Radiation Exposure Index (if provided by the fluoroscopic device): 1.65 mGy Number of Acquired Spot Images: 2 FINDINGS: Right hip replacement is now seen. Previously noted left hip replacement is again identified. No acute bony or soft tissue abnormality is seen. IMPRESSION: Interval right hip replacement. Electronically Signed   By: Inez Catalina M.D.   On: 01/07/2020 14:49   DG Hip Port Unilat With Pelvis 1V Right  Result Date: 01/07/2020 CLINICAL DATA:  54 year old male status post right hip replacement. EXAM: DG HIP (WITH OR WITHOUT PELVIS) 1V PORT RIGHT COMPARISON:  Right hip radiograph dated 12/20/2019. FINDINGS: Interval placement of a total right hip arthroplasty.  Prior left hip arthroplasty noted. The arthroplasty components appear intact and in anatomic alignment. There is no acute fracture or dislocation. The bones are well mineralized. Postsurgical changes of the soft tissues of the right hip and cutaneous staples. IMPRESSION: Status post right hip arthroplasty. No acute fracture or dislocation. Electronically Signed   By: Anner Crete M.D.   On: 01/07/2020 16:43   DG HIP OPERATIVE UNILAT W OR W/O PELVIS RIGHT  Result Date: 01/07/2020 CLINICAL DATA:  Right hip replacement EXAM: OPERATIVE RIGHT HIP WITH PELVIS; DG C-ARM 1-60 MIN COMPARISON:  12/20/2019 FLUOROSCOPY TIME:  Fluoroscopy Time:  18 seconds Radiation Exposure Index (if provided by the fluoroscopic device): 1.65 mGy Number of Acquired Spot Images: 2 FINDINGS: Right hip replacement is now seen. Previously noted left hip replacement is again identified. No acute bony or soft tissue abnormality is seen. IMPRESSION: Interval right hip replacement. Electronically Signed   By: Inez Catalina M.D.   On: 01/07/2020 14:49    Assessment/Plan: 1 Day Post-Op Procedure(s) (LRB): RIGHT TOTAL HIP ARTHROPLASTY DIRECT ANTERIOR (Right) Up with therapy, home after therapy today.   [name] 01/08/2020, 7:52 AM

## 2020-01-08 NOTE — Plan of Care (Signed)
  Problem: Activity: Goal: Risk for activity intolerance will decrease Outcome: Progressing   Problem: Coping: Goal: Level of anxiety will decrease Outcome: Progressing   Problem: Pain Managment: Goal: General experience of comfort will improve Outcome: Progressing   Problem: Safety: Goal: Ability to remain free from injury will improve Outcome: Progressing   Problem: Skin Integrity: Goal: Risk for impaired skin integrity will decrease Outcome: Progressing   

## 2020-01-08 NOTE — Evaluation (Signed)
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 PMH gluacoma with blindness, prosthetic R eye, corneal transplant, HTN, pancreatitis.  Clinical Impression  Patient received in bed, agrees to PT assessment. Reports he could not sleep due to being uncomfortable and in pain last night. Motivated to move and return home hopefully today. He requires min assist with bed mobility due to pain. Transfers with mod independence. Ambulated 50 feet with RW and min guard to supervision with cues for direction and safety due to blindness. He will continue to benefit from skilled PT while here to improve functional independence for return home.     Follow Up Recommendations Home health PT;Supervision - Intermittent    Equipment Recommendations  None recommended by PT    Recommendations for Other Services       Precautions / Restrictions Precautions Precautions: Fall Restrictions Weight Bearing Restrictions: Yes RLE Weight Bearing: Weight bearing as tolerated      Mobility  Bed Mobility Overal bed mobility: Needs Assistance Bed Mobility: Supine to Sit     Supine to sit: Min guard        Transfers Overall transfer level: Modified independent Equipment used: None             General transfer comment: stood as he exited bed. Good balance  Ambulation/Gait Ambulation/Gait assistance: Min guard Gait Distance (Feet): 50 Feet Assistive device: Rolling walker (2 wheeled) Gait Pattern/deviations: Step-to pattern;Decreased weight shift to right;Decreased stride length Gait velocity: decr   General Gait Details: generally steady with unsing RW. ambulated to bathroom and then to door and back to recliner. Min guard/supervision and cues for direction due to blindness.  Stairs            Wheelchair Mobility    Modified Rankin (Stroke  Patients Only)       Balance Overall balance assessment: Modified Independent                                           Pertinent Vitals/Pain Pain Assessment: 0-10 Pain Score: 9  Pain Descriptors / Indicators: Sore;Guarding;Grimacing Pain Intervention(s): Monitored during session;Limited activity within patient's tolerance;Repositioned    Home Living Family/patient expects to be discharged to:: Private residence Living Arrangements: Alone;Other (Comment) Available Help at Discharge: Available PRN/intermittently;Personal care attendant Type of Home: House Home Access: Level entry     Home Layout: One level Home Equipment: Walker - 2 wheels      Prior Function Level of Independence: Independent with assistive device(s)         Comments: independently ambulating at baseline. Has walker from prior surgery. has PCA 2 hours a day 7 days a week.     Hand Dominance   Dominant Hand: Right    Extremity/Trunk Assessment   Upper Extremity Assessment Upper Extremity Assessment: Overall WFL for tasks assessed    Lower Extremity Assessment Lower Extremity Assessment: RLE deficits/detail RLE Sensation: WNL RLE Coordination: decreased gross motor    Cervical / Trunk Assessment Cervical / Trunk Assessment: Normal  Communication   Communication: No difficulties  Cognition Arousal/Alertness: Awake/alert Behavior During Therapy: WFL for tasks assessed/performed Overall Cognitive Status: Within Functional Limits for tasks assessed  General Comments      Exercises Total Joint Exercises Ankle Circles/Pumps: AROM;10 reps;Both Quad Sets: AROM;5 reps;Both Gluteal Sets: AROM;Both;5 reps Heel Slides: AAROM;Right;10 reps Hip ABduction/ADduction: AAROM;Right;10 reps   Assessment/Plan    PT Assessment Patient needs continued PT services  PT Problem List Decreased strength;Decreased mobility;Decreased  activity tolerance;Pain       PT Treatment Interventions Therapeutic activities;Gait training;Therapeutic exercise;Patient/family education;Functional mobility training    PT Goals (Current goals can be found in the Care Plan section)  Acute Rehab PT Goals Patient Stated Goal: to return home 'So I can sleep" PT Goal Formulation: With patient Time For Goal Achievement: 01/12/20 Potential to Achieve Goals: Good    Frequency 7X/week   Barriers to discharge        Co-evaluation               AM-PAC PT "6 Clicks" Mobility  Outcome Measure Help needed turning from your back to your side while in a flat bed without using bedrails?: A Little Help needed moving from lying on your back to sitting on the side of a flat bed without using bedrails?: A Little Help needed moving to and from a bed to a chair (including a wheelchair)?: A Little Help needed standing up from a chair using your arms (e.g., wheelchair or bedside chair)?: A Little Help needed to walk in hospital room?: A Little Help needed climbing 3-5 steps with a railing? : A Little 6 Click Score: 18    End of Session   Activity Tolerance: Patient tolerated treatment well;Patient limited by pain Patient left: in chair;with call bell/phone within reach;with family/visitor present;with SCD's reapplied Nurse Communication: Mobility status PT Visit Diagnosis: Muscle weakness (generalized) (M62.81);Pain;Difficulty in walking, not elsewhere classified (R26.2) Pain - Right/Left: Right Pain - part of body: Hip    Time: 2774-1287 PT Time Calculation (min) (ACUTE ONLY): 26 min   Charges:   PT Evaluation $PT Eval Moderate Complexity: 1 Mod PT Treatments $Gait Training: 8-22 mins        Sohan Potvin, PT, GCS 01/08/20,10:16 AM

## 2020-01-09 ENCOUNTER — Telehealth: Payer: Self-pay | Admitting: *Deleted

## 2020-01-09 NOTE — Telephone Encounter (Signed)
Received email from [employer] stating that patient had called and requested [facility] services after his discharge from hospital yesterday, 01/08/20 after his total joint replacement (hip) with Dr. Lorin Mercy on 01/07/20. Discussed with Dr. Lorin Mercy. Patient had previous hip replacement in December of last year and did receive [facility] therapy services at that time. Dr. Lorin Mercy gave verbal to begin home health physical therapy for patient. Call to patient and updated on this information. Referral made to [facility] for home health PT. Liaison was able to pull patient up and will reach out to have patient scheduled ASAP. Eval and treat at least through his first post-op on 01/22/20 with Dr. Lorin Mercy.

## 2020-01-17 NOTE — Discharge Summary (Addendum)
Patient ID: Austin [name] MRN: [id number] DOB/AGE: 54/15/1967 54 y.o.  Admit date: 01/07/2020 Discharge date: 01/08/2020 Admission Diagnoses:  Active Problems:   Avascular necrosis of bone of right hip (HCC)   Avascular necrosis of bone of hip, right [facility])   Discharge Diagnoses:  Active Problems:   Avascular necrosis of bone of right hip (HCC)   Avascular necrosis of bone of hip, right (HCC)  status post Procedure(s): RIGHT TOTAL HIP ARTHROPLASTY DIRECT ANTERIOR  Past Medical History:  Diagnosis Date  . Alcohol abuse   . Anxiety   . Arthritis   . 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 Elevated LFTs   . Foot and toe(s), blister    bilat feet, blistering between toes  . GERD (gastroesophageal reflux disease)   . Glaucoma   . Glaucoma as birth trauma   . Headache(784.0)   . Hypertension    hx of  . Insomnia   . Pancreatitis   . UTI (lower urinary tract infection) 08/04/11   finishing abx tomorrow  . Weight loss    25 lb wt loss since 08/2010  . Wrist fracture, right     Surgeries: Procedure(s): RIGHT TOTAL HIP ARTHROPLASTY DIRECT ANTERIOR on 01/07/2020   Consultants:   Discharged Condition: Improved  Hospital Course: Austin [name] is an 54 y.o. male who was admitted 01/07/2020 for operative treatment of right hip djd/AVN.  Patient failed conservative treatments (please see the history and physical for the specifics) and had severe unremitting pain that affects sleep, daily activities and work/hobbies. After pre-op clearance, the patient was taken to the operating room on 01/07/2020 and underwent  Procedure(s): RIGHT TOTAL HIP ARTHROPLASTY DIRECT ANTERIOR.    Patient was given perioperative antibiotics:  Anti-infectives (From admission, onward)   Start     Dose/Rate Route Frequency Ordered Stop   01/07/20 1115  ceFAZolin (ANCEF) IVPB 2g/100 mL premix        2 g 200 mL/hr over 30 Minutes Intravenous On call to O.R. 01/07/20 1103 01/07/20 1250       Patient  was given sequential compression devices and early ambulation to prevent DVT.   Patient benefited maximally from hospital stay and there were no complications. At the time of discharge, the patient was urinating/moving their bowels without difficulty, tolerating a regular diet, pain is controlled with oral pain medications and they have been cleared by PT/OT.   Recent vital signs: No data found.   Recent laboratory studies: No results for input(s): WBC, HGB, HCT, PLT, NA, K, CL, CO2, BUN, CREATININE, GLUCOSE, INR, CALCIUM in the last 72 hours.  Invalid input(s): PT, 2   Discharge Medications:   Allergies as of 01/08/2020      Reactions   Chlorhexidine    Foley D/Ced this AM 01/08/2020      Medication List    TAKE these medications   ascorbic acid 500 MG tablet Commonly known as: VITAMIN C Take 500 mg by mouth daily.   aspirin EC 325 MG tablet Take 1 tablet (325 mg total) by mouth daily. MUST TAKE AT LEAST 4 WEEKS POSTOP FOR DVT PROPHYLAXIS   folic acid 1 MG tablet Commonly known as: FOLVITE Take 1 mg by mouth daily.   methocarbamol 500 MG tablet Commonly known as: Robaxin Take 1 tablet (500 mg total) by mouth every 6 (six) hours as needed for muscle spasms.   Na Sulfate-K Sulfate-Mg Sulf 17.5-3.13-1.6 GM/177ML Soln Suprep (no substitutions)-TAKE AS DIRECTED.  ondansetron 4 MG tablet Commonly known as: ZOFRAN Take 4 mg by mouth every 8 (eight) hours as needed for nausea or vomiting.   OVER THE COUNTER MEDICATION Take 2 tablets by mouth daily. Sea moss otc supplement   oxyCODONE-acetaminophen 5-325 MG tablet Commonly known as: PERCOCET/ROXICET Take 1 tablet by mouth every 4 (four) hours as needed for severe pain.   VITAMIN D PO Take 1 tablet by mouth daily.   vitamin E 180 MG (400 UNITS) capsule Take 400 Units by mouth daily.   ZINC 15 PO Take 30 mg by mouth daily.       Diagnostic Studies: DG C-Arm 1-60 Min  Result Date: 01/07/2020 CLINICAL DATA:  Right hip  replacement EXAM: OPERATIVE RIGHT HIP WITH PELVIS; DG C-ARM 1-60 MIN COMPARISON:  12/20/2019 FLUOROSCOPY TIME:  Fluoroscopy Time:  18 seconds Radiation Exposure Index (if provided by the fluoroscopic device): 1.65 mGy Number of Acquired Spot Images: 2 FINDINGS: Right hip replacement is now seen. Previously noted left hip replacement is again identified. No acute bony or soft tissue abnormality is seen. IMPRESSION: Interval right hip replacement. Electronically Signed   By: Inez Catalina M.D.   On: 01/07/2020 14:49   DG Hip Port Unilat With Pelvis 1V Right  Result Date: 01/07/2020 CLINICAL DATA:  54 year old male status post right hip replacement. EXAM: DG HIP (WITH OR WITHOUT PELVIS) 1V PORT RIGHT COMPARISON:  Right hip radiograph dated 12/20/2019. FINDINGS: Interval placement of a total right hip arthroplasty. Prior left hip arthroplasty noted. The arthroplasty components appear intact and in anatomic alignment. There is no acute fracture or dislocation. The bones are well mineralized. Postsurgical changes of the soft tissues of the right hip and cutaneous staples. IMPRESSION: Status post right hip arthroplasty. No acute fracture or dislocation. Electronically Signed   By: Anner Crete M.D.   On: 01/07/2020 16:43   DG HIP OPERATIVE UNILAT W OR W/O PELVIS RIGHT  Result Date: 01/07/2020 CLINICAL DATA:  Right hip replacement EXAM: OPERATIVE RIGHT HIP WITH PELVIS; DG C-ARM 1-60 MIN COMPARISON:  12/20/2019 FLUOROSCOPY TIME:  Fluoroscopy Time:  18 seconds Radiation Exposure Index (if provided by the fluoroscopic device): 1.65 mGy Number of Acquired Spot Images: 2 FINDINGS: Right hip replacement is now seen. Previously noted left hip replacement is again identified. No acute bony or soft tissue abnormality is seen. IMPRESSION: Interval right hip replacement. Electronically Signed   By: Inez Catalina M.D.   On: 01/07/2020 14:49       Follow-up Information    [name], MD. Schedule an appointment as soon  as possible for a visit in 1 week(s).   Specialty: Orthopedic Surgery Contact information: [address] [place] [address] [phone]               Discharge Plan:  discharge to home  Disposition:     Signed: [name] for Austin Perna MD 01/17/2020, 9:27 AM

## 2020-01-22 ENCOUNTER — Ambulatory Visit (INDEPENDENT_AMBULATORY_CARE_PROVIDER_SITE_OTHER): Payer: Medicare HMO

## 2020-01-22 ENCOUNTER — Encounter: Payer: Self-pay | Admitting: Orthopaedic Surgery

## 2020-01-22 ENCOUNTER — Ambulatory Visit (INDEPENDENT_AMBULATORY_CARE_PROVIDER_SITE_OTHER): Payer: Medicare HMO | Admitting: Orthopaedic Surgery

## 2020-01-22 VITALS — Ht 67.0 in | Wt 154.0 lb

## 2020-01-22 DIAGNOSIS — Z96641 Presence of right artificial hip joint: Secondary | ICD-10-CM | POA: Diagnosis not present

## 2020-01-22 MED ORDER — HYDROCODONE-ACETAMINOPHEN 5-325 MG PO TABS: 1.0000 | ORAL_TABLET | Freq: Four times a day (QID) | ORAL | 0 refills | Status: DC | PRN

## 2020-01-22 NOTE — Progress Notes (Signed)
Post-Op Visit Note   Patient: Austin [name]           Date of Birth: 24-Nov-1965           MRN: [id number] Visit Date: 01/22/2020 PCP: Buzzy Han, MD   Assessment & Plan:  Chief Complaint:  Chief Complaint  Patient presents with  . Right Hip - Routine Post Op    01/07/2020 Right THA   Visit Diagnoses:  1. Status post total hip replacement, right     Plan: Incision looks good staples are harvested Steri-Strips applied. X-rays reviewed good position alignment. Norco sent in for pain as we wean him from the Percocet. I will recheck him in 4 weeks. He is happy with the results of surgery leg lengths are equal.  Follow-Up Instructions: No follow-ups on file.   Orders:  Orders Placed This Encounter  Procedures  . XR HIP UNILAT W OR W/O PELVIS 2-3 VIEWS RIGHT   No orders of the defined types were placed in this encounter.   Imaging: No results found.  PMFS History: Patient Active Problem List   Diagnosis Date Noted  . Avascular necrosis of bone of right hip ([place]) 01/07/2020  . Avascular necrosis of bone of hip, right ([place]) 01/07/2020  . Acute pancreatitis 10/05/2019  . 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 Intractable nausea and vomiting 12/29/2018  . Alcoholic pancreatitis 73/53/2992  . Essential hypertension 04/14/2018  . Blindness of both eyes 04/14/2018  . Alcohol use 04/14/2018  . Hyponatremia 04/14/2018  . RUQ pain   . Abnormal CT of the abdomen   . 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 Retained orthopedic hardware 08/07/2011    Class: Chronic   Past Medical History:  Diagnosis Date  . Alcohol abuse   . Anxiety   . Arthritis   . 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 Elevated LFTs   . Foot and toe(s), blister    bilat feet, blistering between toes  . GERD (gastroesophageal reflux disease)   . Glaucoma   . Glaucoma as birth trauma   . Headache(784.0)   . Hypertension    hx of  . Insomnia   . Pancreatitis    . UTI (lower urinary tract infection) 08/04/11   finishing abx tomorrow  . Weight loss    25 lb wt loss since 08/2010  . Wrist fracture, right     Family History  Problem Relation Age of Onset  . Diabetes Sister   . Diabetes Maternal Aunt        aunt and uncles  . Hypertension Sister        aunt and uncles maternal side  . Colon cancer Neg Hx   . Colon polyps Neg Hx   . Esophageal cancer Neg Hx   . Rectal cancer Neg Hx   . Stomach cancer Neg Hx     Past Surgical History:  Procedure Laterality Date  . COLONOSCOPY  2006?  . ESOPHAGOGASTRODUODENOSCOPY    . ESOPHAGOGASTRODUODENOSCOPY (EGD) WITH PROPOFOL N/A 05/09/2017   Procedure: ESOPHAGOGASTRODUODENOSCOPY (EGD) WITH PROPOFOL;  Surgeon: Doran Stabler, MD;  Location: [facility];  Service: Gastroenterology;  Laterality: N/A;  . EYE SURGERY Left   . EYE SURGERY Right    prostetic eye  . foot srugery     removal of bone near small toe  . FRACTURE SURGERY Right    wrist  . HARDWARE REMOVAL  08/06/2011   Procedure: HARDWARE  REMOVAL;  Surgeon: Newt Minion, MD;  Location: [facility];  Service: Orthopedics;  Laterality: Right;  Removal Deep Retained Hardware Right Distal Radius  . MULTIPLE TOOTH EXTRACTIONS     for dentures  . TOTAL HIP ARTHROPLASTY Left 06/06/2019   Procedure: LEFT TOTAL HIP ARTHROPLASTY-DIRECT ANTERIOR;  Surgeon: [name], MD;  Location: [facility];  Service: Orthopedics;  Laterality: Left;  . TOTAL HIP ARTHROPLASTY Right 01/07/2020   Procedure: RIGHT TOTAL HIP ARTHROPLASTY DIRECT ANTERIOR;  Surgeon: [name], MD;  Location: [facility];  Service: Orthopedics;  Laterality: Right;   Social History   Occupational History  . Occupation: disabled  Tobacco Use  . Smoking status: Current Every Day Smoker    Packs/day: 0.25    Years: 17.00    Pack years: 4.25    Types: Cigars  . Smokeless tobacco: Never Used  . Tobacco comment: 3 cigars a day   Vaping Use  . Vaping Use: Never used  Substance and Sexual Activity    . Alcohol use: Yes    Comment: 1 pint a day of liquor per pt  . Drug use: Yes    Frequency: 7.0 times per week    Types: Marijuana    Comment: smokes marijuana 1 to 2 times daily  . Sexual activity: Not Currently

## 2020-02-06 ENCOUNTER — Encounter: Payer: Self-pay | Admitting: Gastroenterology

## 2020-02-08 ENCOUNTER — Other Ambulatory Visit: Payer: Self-pay

## 2020-02-08 ENCOUNTER — Ambulatory Visit (AMBULATORY_SURGERY_CENTER): Payer: Self-pay | Admitting: *Deleted

## 2020-02-08 ENCOUNTER — Telehealth: Payer: Self-pay | Admitting: *Deleted

## 2020-02-08 VITALS — Ht 67.0 in | Wt 151.0 lb

## 2020-02-08 DIAGNOSIS — Z1211 Encounter for screening for malignant neoplasm of colon: Secondary | ICD-10-CM

## 2020-02-08 NOTE — Progress Notes (Signed)
Had a hip replacement 01-07-2020- TE to HD- ? Proceed 9-21 vs wait until 8-12 weeks post op   No egg or soy allergy known to patient  No issues with past sedation with any surgeries or procedures no intubation problems in the past  No FH of Malignant Hyperthermia No diet pills per patient No home 02 use per patient  No blood thinners per patient  Pt denies issues with constipation  No A fib or A flutter  EMMI video to pt or via [place] 19 guidelines implemented in PV today with Pt and RN   Pt has a Suprep at home from 09-2019 PV   Due to the COVID-19 pandemic we are asking patients to follow these guidelines. Please only bring one care partner. Please be aware that your care partner may wait in the car in the parking lot or if they feel like they will be too hot to wait in the car, they may wait in the lobby on the 4th floor. All care partners are required to wear a mask the entire time (we do not have any that we can provide them), they need to practice social distancing, and we will do a Covid check for all patient's and care partners when you arrive. Also we will check their temperature and your temperature. If the care partner waits in their car they need to stay in the parking lot the entire time and we will call them on their cell phone when the patient is ready for discharge so they can bring the car to the front of the building. Also all patient's will need to wear a mask into building.

## 2020-02-08 NOTE — Telephone Encounter (Signed)
DR Loletha Carrow,  Pt was in PV today- he is Blind- had an OV with Ellouise Newer 10-25-2019 and was rescheduled for a screening colonoscopy- he had a total hip replacement 01-07-2020- he is doing well - he has not been released from ortho- he sees them 02-19-2020- Colon here 02-26-2020, 7 weeks post op -   Do you want to proceed with 9-21 Colon or RS due to 8-2 hip replacement  Thanks, Lelan Pons PV

## 2020-02-12 NOTE — Telephone Encounter (Signed)
OK for colonoscopy as scheduled.  Thanks for checking.  - HD

## 2020-02-12 NOTE — Telephone Encounter (Signed)
Thanks, will proceed as scheduled

## 2020-02-19 ENCOUNTER — Ambulatory Visit (INDEPENDENT_AMBULATORY_CARE_PROVIDER_SITE_OTHER): Payer: Medicare HMO

## 2020-02-19 ENCOUNTER — Encounter: Payer: Self-pay | Admitting: Orthopaedic Surgery

## 2020-02-19 ENCOUNTER — Ambulatory Visit (INDEPENDENT_AMBULATORY_CARE_PROVIDER_SITE_OTHER): Payer: Medicare HMO | Admitting: Orthopaedic Surgery

## 2020-02-19 VITALS — BP 128/80 | HR 89 | Temp 99.0°F | Ht 67.0 in | Wt 151.0 lb

## 2020-02-19 DIAGNOSIS — M545 Low back pain: Secondary | ICD-10-CM | POA: Diagnosis not present

## 2020-02-19 DIAGNOSIS — M5416 Radiculopathy, lumbar region: Secondary | ICD-10-CM

## 2020-02-19 DIAGNOSIS — Z96642 Presence of left artificial hip joint: Secondary | ICD-10-CM

## 2020-02-19 DIAGNOSIS — G8929 Other chronic pain: Secondary | ICD-10-CM

## 2020-02-19 DIAGNOSIS — Z96641 Presence of right artificial hip joint: Secondary | ICD-10-CM | POA: Diagnosis not present

## 2020-02-19 DIAGNOSIS — R509 Fever, unspecified: Secondary | ICD-10-CM

## 2020-02-19 DIAGNOSIS — R29898 Other symptoms and signs involving the musculoskeletal system: Secondary | ICD-10-CM

## 2020-02-19 MED ORDER — DICLOFENAC SODIUM 75 MG PO TBEC
75.0000 mg | DELAYED_RELEASE_TABLET | Freq: Two times a day (BID) | ORAL | 0 refills | Status: DC | PRN
Start: 2020-02-19 — End: 2020-08-22

## 2020-02-19 NOTE — Progress Notes (Signed)
Office Visit Note   Patient: Austin [name]           Date of Birth: [birth year]           MRN: [id number] Visit Date: 02/19/2020              Requested by: Buzzy Han, MD [place],  [place] [address] PCP: Buzzy Han, MD   Assessment & Plan: Visit Diagnoses:  1. Status post total hip replacement, right   2. History of total left hip arthroplasty   3. Chronic bilateral low back pain, unspecified whether sciatica present   4. Radiculopathy, lumbar region   5. Low grade fever   6. Right leg weakness     Plan: With patient's pain and low-grade temp I will order labs today to check CBC with differential and c-Met.  Patient also stated that he just was not feeling well.  When I asked he denied any Covid symptoms.  Patient was encouraged to slow down on some of his activity.  He may have been trying to do too much too quickly.  I do not want to give him a prednisone taper since he is pretty early from his surgery.  During my exam my findings showed right anterior tib and gastroc weakness.  Dr. Lorin Mercy went into the room to examine patient after I did and said that he had no focal motor deficits on the right side when he was talking the patient and distracted him.   Patient will follow up with Dr. Lorin Mercy in 2 weeks for recheck.  If he still continues to complain of feeling of right lower extremity weakness we may need to consider getting a lumbar MRI to rule out HNP/stenosis.  Dr. Lorin Mercy wanted to give patient an oral NSAID for inflammation so I sent in Voltaren 75 mg p.o. twice daily to his pharmacy.  Follow-Up Instructions: Return in about 2 weeks (around 03/04/2020) for with dr yates for recheck.   Orders:  Orders Placed This Encounter  Procedures  . XR HIPS BILAT W OR W/O PELVIS 3-4 VIEWS  . XR Lumbar Spine 2-3 Views  . CBC With Differential  . Comprehensive Metabolic Panel (CMET)   No orders of the defined types were placed in this  encounter.     Procedures: No procedures performed   Clinical Data: No additional findings.   Subjective: Chief Complaint  Patient presents with  . Right Hip - Follow-up    01/07/2020 Right THA    HPI 54 year old black male who is about 6 weeks status post right total hip replacement returns.  He is also status post left total hip replacement June 06, 2019.  He states that over the last couple weeks has been having increased pain in his right hip with radiation down to his right thigh and calf.  He feels like he is also had some weakness in the right leg which caused him to fall a couple weeks ago.  He is not complain of any specific back pain.  Not having any right leg radicular type pain leading up to his surgery.  After looking through his chart he was seen in the emergency room May 2017 with complaints of back pain and right lower extremity pain.  He had lumbar spine x-rays performed at that time.  Never had a lumbar MRI.  When I brought this up to patient he denied ever being seen for this problem.  States that his left leg is doing okay. Review  of Systems Patient denies cough, lightheadedness, dizziness, chest pain, shortness of breath or any other possible covid symptoms.    Objective: Vital Signs: BP 128/80   Pulse 89   Temp 99 F (37.2 C)   Ht _0  (1.702 m)   Wt 151 lb (68.5 kg)   BMI 23.65 kg/m   Physical Exam Pulmonary:     Effort: No respiratory distress.  Musculoskeletal:     Comments: Positive right-sided notch tenderness.  Some discomfort right anterior hip with hip flexion and internal/external rotation.  Right hip surgical incision is well-healed.  Does have some swelling although not extreme.  No gross signs of infection.  Pain with right straight leg raise.  Calf nontender bilaterally.  Neurological:     Mental Status: He is alert and oriented to person, place, and time.     Comments: Patient does have some right anterior tib and gastroc weakness.   Left lower extremity strong.     Ortho Exam  Specialty Comments:  No specialty comments available.  Imaging: No results found.   PMFS History: Patient Active Problem List   Diagnosis Date Noted  . Avascular necrosis of bone of right hip ([facility]) 01/07/2020  . Avascular necrosis of bone of hip, right ([facility]) 01/07/2020  . Acute pancreatitis 10/05/2019  . 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 Intractable nausea and vomiting 12/29/2018  . Alcoholic pancreatitis 42/39/5320  . Essential hypertension 04/14/2018  . Blindness of both eyes 04/14/2018  . Alcohol use 04/14/2018  . Hyponatremia 04/14/2018  . RUQ pain   . Abnormal CT of the abdomen   . 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 Retained orthopedic hardware 08/07/2011    Class: Chronic   Past Medical History:  Diagnosis Date  . Alcohol abuse   . Anxiety   . Arthritis   . 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 Elevated LFTs   . Foot and toe(s), blister    bilat feet, blistering between toes  . GERD (gastroesophageal reflux disease)   . Glaucoma   . Glaucoma as birth trauma   . Headache(784.0)   . Hypertension    hx of  . Insomnia   . Pancreatitis   . UTI (lower urinary tract infection) 08/04/11   finishing abx tomorrow  . Weight loss    25 lb wt loss since 08/2010  . Wrist fracture, right     Family History  Problem Relation Age of Onset  . Diabetes Sister   . Diabetes Maternal Aunt        aunt and uncles  . Hypertension Sister        aunt and uncles maternal side  . Colon cancer Neg Hx   . Colon polyps Neg Hx   . Esophageal cancer Neg Hx   . Rectal cancer Neg Hx   . Stomach cancer Neg Hx     Past Surgical History:  Procedure Laterality Date  . COLONOSCOPY  2006?  . ESOPHAGOGASTRODUODENOSCOPY    . ESOPHAGOGASTRODUODENOSCOPY (EGD) WITH PROPOFOL N/A 05/09/2017   Procedure: ESOPHAGOGASTRODUODENOSCOPY (EGD) WITH PROPOFOL;  Surgeon: Doran Stabler, MD;  Location: [facility];  Service: Gastroenterology;  Laterality: N/A;  . EYE SURGERY Left   . EYE SURGERY Right    prostetic eye  . foot srugery     removal of bone near small toe  . FRACTURE SURGERY Right    wrist  . HARDWARE REMOVAL  08/06/2011  Procedure: HARDWARE REMOVAL;  Surgeon: Newt Minion, MD;  Location: [facility];  Service: Orthopedics;  Laterality: Right;  Removal Deep Retained Hardware Right Distal Radius  . MULTIPLE TOOTH EXTRACTIONS     for dentures  . TOTAL HIP ARTHROPLASTY Left 06/06/2019   Procedure: LEFT TOTAL HIP ARTHROPLASTY-DIRECT ANTERIOR;  Surgeon: [name], MD;  Location: [facility];  Service: Orthopedics;  Laterality: Left;  . TOTAL HIP ARTHROPLASTY Right 01/07/2020   Procedure: RIGHT TOTAL HIP ARTHROPLASTY DIRECT ANTERIOR;  Surgeon: [name], MD;  Location: [facility];  Service: Orthopedics;  Laterality: Right;  . UPPER GASTROINTESTINAL ENDOSCOPY     Social History   Occupational History  . Occupation: disabled  Tobacco Use  . Smoking status: Current Every Day Smoker    Packs/day: 0.25    Years: 17.00    Pack years: 4.25    Types: Cigars  . Smokeless tobacco: Never Used  . Tobacco comment: 2-3 cigars a day   Vaping Use  . Vaping Use: Never used  Substance and Sexual Activity  . Alcohol use: Yes    Comment: 1 pint a day of liquor per pt  . Drug use: Yes    Frequency: 7.0 times per week    Types: Marijuana    Comment: smokes marijuana 1 to 2 times daily  . Sexual activity: Not Currently

## 2020-02-22 ENCOUNTER — Other Ambulatory Visit: Payer: Self-pay | Admitting: Gastroenterology

## 2020-02-22 ENCOUNTER — Ambulatory Visit (INDEPENDENT_AMBULATORY_CARE_PROVIDER_SITE_OTHER): Payer: Medicare HMO

## 2020-02-22 DIAGNOSIS — Z1159 Encounter for screening for other viral diseases: Secondary | ICD-10-CM

## 2020-02-22 LAB — SARS CORONAVIRUS 2 (TAT 6-24 HRS): SARS Coronavirus 2: NEGATIVE

## 2020-02-26 ENCOUNTER — Ambulatory Visit (AMBULATORY_SURGERY_CENTER): Payer: Medicare HMO | Admitting: Gastroenterology

## 2020-02-26 ENCOUNTER — Encounter: Payer: Self-pay | Admitting: Gastroenterology

## 2020-02-26 ENCOUNTER — Other Ambulatory Visit: Payer: Self-pay

## 2020-02-26 VITALS — BP 137/89 | HR 65 | Temp 96.9°F | Resp 28 | Ht 67.0 in

## 2020-02-26 DIAGNOSIS — Z1211 Encounter for screening for malignant neoplasm of colon: Secondary | ICD-10-CM | POA: Diagnosis not present

## 2020-02-26 DIAGNOSIS — D123 Benign neoplasm of transverse colon: Secondary | ICD-10-CM

## 2020-02-26 DIAGNOSIS — D124 Benign neoplasm of descending colon: Secondary | ICD-10-CM | POA: Diagnosis not present

## 2020-02-26 MED ORDER — SODIUM CHLORIDE 0.9 % IV SOLN: 500.0000 mL | Freq: Once | INTRAVENOUS | Status: DC

## 2020-02-26 NOTE — Patient Instructions (Signed)
Handout given for polyps.  Await pathology results.  YOU HAD AN ENDOSCOPIC PROCEDURE TODAY AT THE [facility]:   Refer to the procedure report that was given to you for any specific questions about what was found during the examination.  If the procedure report does not answer your questions, please call your gastroenterologist to clarify.  If you requested that your care partner not be given the details of your procedure findings, then the procedure report has been included in a sealed envelope for you to review at your convenience later.  YOU SHOULD EXPECT: Some feelings of bloating in the abdomen. Passage of more gas than usual.  Walking can help get rid of the air that was put into your GI tract during the procedure and reduce the bloating. If you had a lower endoscopy (such as a colonoscopy or flexible sigmoidoscopy) you may notice spotting of blood in your stool or on the toilet paper. If you underwent a bowel prep for your procedure, you may not have a normal bowel movement for a few days.  Please Note:  You might notice some irritation and congestion in your nose or some drainage.  This is from the oxygen used during your procedure.  There is no need for concern and it should clear up in a day or so.  SYMPTOMS TO REPORT IMMEDIATELY:   Following lower endoscopy (colonoscopy or flexible sigmoidoscopy):  Excessive amounts of blood in the stool  Significant tenderness or worsening of abdominal pains  Swelling of the abdomen that is new, acute  Fever of 100F or higher  For urgent or emergent issues, a gastroenterologist can be reached at any hour by calling [phone]. Do not use MyChart messaging for urgent concerns.    DIET:  We do recommend a small meal at first, but then you may proceed to your regular diet.  Drink plenty of fluids but you should avoid alcoholic beverages for 24 hours.  ACTIVITY:  You should plan to take it easy for the rest of today and you should  NOT DRIVE or use heavy machinery until tomorrow (because of the sedation medicines used during the test).    FOLLOW UP: Our staff will call the number listed on your records 48-72 hours following your procedure to check on you and address any questions or concerns that you may have regarding the information given to you following your procedure. If we do not reach you, we will leave a message.  We will attempt to reach you two times.  During this call, we will ask if you have developed any symptoms of COVID 19. If you develop any symptoms (ie: fever, flu-like symptoms, shortness of breath, cough etc.) before then, please call [phone].  If you test positive for Covid 19 in the 2 weeks post procedure, please call and report this information to us.    If any biopsies were taken you will be contacted by phone or by letter within the next 1-3 weeks.  Please call us at [phone] if you have not heard about the biopsies in 3 weeks.    SIGNATURES/CONFIDENTIALITY: You and/or your care partner have signed paperwork which will be entered into your electronic medical record.  These signatures attest to the fact that that the information above on your After Visit Summary has been reviewed and is understood.  Full responsibility of the confidentiality of this discharge information lies with you and/or your care-partner. 

## 2020-02-26 NOTE — Progress Notes (Signed)
Called to room to assist during endoscopic procedure.  Patient ID and intended procedure confirmed with present staff. Received instructions for my participation in the procedure from the performing physician.  

## 2020-02-26 NOTE — Op Note (Addendum)
[facility] Patient Name: Austin [name] Procedure Date: 02/26/2020 1:20 PM MRN: [id number] Endoscopist: Mallie Mussel L. Loletha Carrow , MD Age: 54 Referring MD:  Date of Birth: Feb 25, 1966 Gender: Male Account #: [id number] Procedure:                Colonoscopy Indications:              Screening for colorectal malignant neoplasm, This                            is the patient's first colonoscopy Medicines:                Monitored Anesthesia Care Procedure:                Pre-Anesthesia Assessment:                           - Prior to the procedure, a History and Physical                            was performed, and patient medications and                            allergies were reviewed. The patient's tolerance of                            previous anesthesia was also reviewed. The risks                            and benefits of the procedure and the sedation                            options and risks were discussed with the patient.                            All questions were answered, and informed consent                            was obtained. Prior Anticoagulants: The patient has                            taken no previous anticoagulant or antiplatelet                            agents except for aspirin. ASA Grade Assessment:                            III - A patient with severe systemic disease. After                            reviewing the risks and benefits, the patient was                            deemed in satisfactory condition to undergo the  procedure.                           After obtaining informed consent, the colonoscope                            was passed under direct vision. Throughout the                            procedure, the patient's blood pressure, pulse, and                            oxygen saturations were monitored continuously. The                            Colonoscope was introduced through the anus and                             advanced to the the terminal ileum, with                            identification of the appendiceal orifice and IC                            valve. The colonoscopy was performed without                            difficulty. The patient tolerated the procedure                            well. The quality of the bowel preparation was                            good. The ileocecal valve, appendiceal orifice, and                            rectum were photographed. Scope In: 1:43:17 PM Scope Out: 1:57:06 PM Scope Withdrawal Time: 0 hours 10 minutes 19 seconds  Total Procedure Duration: 0 hours 13 minutes 49 seconds  Findings:                 The perianal and digital rectal examinations were                            normal.                           The terminal ileum appeared normal.                           Two sessile polyps were found in the descending                            colon and transverse colon. The polyps were  diminutive in size. These polyps were removed with                            a cold snare. Resection and retrieval were complete.                           The exam was otherwise without abnormality on                            direct and retroflexion views. Complications:            No immediate complications. Estimated Blood Loss:     Estimated blood loss was minimal. Impression:               - The examined portion of the ileum was normal.                           - Two diminutive polyps in the descending colon and                            in the transverse colon, removed with a cold snare.                            Resected and retrieved.                           - The examination was otherwise normal on direct                            and retroflexion views. Recommendation:           - Patient has a contact number available for                            emergencies. The signs and symptoms of potential                             delayed complications were discussed with the                            patient. Return to normal activities tomorrow.                            Written discharge instructions were provided to the                            patient.                           - Resume previous diet.                           - Continue present medications.                           - Await pathology results.                           -  Repeat colonoscopy is recommended for                            surveillance. The colonoscopy date will be                            determined after pathology results from today's                            exam become available for review. Rilya Longo L. Loletha Carrow, MD 02/26/2020 2:04:20 PM This report has been signed electronically.

## 2020-02-26 NOTE — Progress Notes (Signed)
pt tolerated well. VSS. awake and to recovery. Report given to RN.  

## 2020-02-26 NOTE — Progress Notes (Signed)
Pt's states no medical or surgical changes since previsit or office visit.  Vitals- Austin [name]

## 2020-02-28 ENCOUNTER — Telehealth: Payer: Self-pay

## 2020-02-28 NOTE — Telephone Encounter (Signed)
°  Follow up Call-  Call back number 02/26/2020  Post procedure Call Back phone  # [phone]  Permission to leave phone message Yes  Some recent data might be hidden     Patient questions:  Do you have a fever, pain , or abdominal swelling? No. Pain Score  0 *  Have you tolerated food without any problems? Yes.    Have you been able to return to your normal activities? Yes.    Do you have any questions about your discharge instructions: Diet   No. Medications  No. Follow up visit  No.  Do you have questions or concerns about your Care? No.  Actions: * If pain score is 4 or above: No action needed, pain <4. 1. Have you developed a fever since your procedure? no  2.   Have you had an respiratory symptoms (SOB or cough) since your procedure? no  3.   Have you tested positive for COVID 19 since your procedure no  4.   Have you had any family members/close contacts diagnosed with the COVID 19 since your procedure?  no   If yes to any of these questions please route to Joylene John, RN and Joella Prince, RN

## 2020-03-04 ENCOUNTER — Ambulatory Visit: Payer: Medicare HMO | Admitting: Orthopaedic Surgery

## 2020-03-05 ENCOUNTER — Encounter: Payer: Self-pay | Admitting: Gastroenterology

## 2020-03-11 ENCOUNTER — Ambulatory Visit (INDEPENDENT_AMBULATORY_CARE_PROVIDER_SITE_OTHER): Payer: Medicare HMO | Admitting: Orthopaedic Surgery

## 2020-03-11 DIAGNOSIS — Z96641 Presence of right artificial hip joint: Secondary | ICD-10-CM | POA: Insufficient documentation

## 2020-03-11 DIAGNOSIS — Z96642 Presence of left artificial hip joint: Secondary | ICD-10-CM

## 2020-03-11 NOTE — Progress Notes (Signed)
Post-Op Visit Note   Patient: Austin [name]           Date of Birth: [birth year]           MRN: [id number] Visit Date: 03/11/2020 PCP: Buzzy Han, MD   Assessment & Plan: Post total of arthroplasty.  He slowed down his activities and states now he is doing fine has no problems and wants to resume work at [employer] starting tomorrow work slip was given. Chief Complaint:  Chief Complaint  Patient presents with  . Right Hip - Routine Post Op    Leg weakness is improving   Visit Diagnoses:  1. Hx of total hip arthroplasty, right   2. History of total hip arthroplasty, left     Plan: Hip is well-healed previous x-rays were satisfactory postop with equal leg lengths.  He has symptoms down into his leg have resolved and now is ambulating normally.  Work slip given for work resumption tomorrow follow-up as needed.  Follow-Up Instructions: Return if symptoms worsen or fail to improve.   Orders:  No orders of the defined types were placed in this encounter.  No orders of the defined types were placed in this encounter.   Imaging: No results found.  PMFS History: Patient Active Problem List   Diagnosis Date Noted  . Hx of total hip arthroplasty, 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  . History of total hip arthroplasty, 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  . Acute pancreatitis 10/05/2019  . 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 Intractable nausea and vomiting 12/29/2018  . Alcoholic pancreatitis 38/18/2993  . Essential hypertension 04/14/2018  . Blindness of both eyes 04/14/2018  . Alcohol use 04/14/2018  . Hyponatremia 04/14/2018  . RUQ pain   . Abnormal CT of the abdomen   . 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 Retained orthopedic hardware 08/07/2011    Class: Chronic   Past Medical History:  Diagnosis Date  . Alcohol abuse   . Anxiety   . Arthritis   . 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 Elevated LFTs   . Foot and toe(s), blister    bilat  feet, blistering between toes  . GERD (gastroesophageal reflux disease)   . Glaucoma   . Glaucoma as birth trauma   . Headache(784.0)   . Hypertension    hx of  . Insomnia   . Pancreatitis   . UTI (lower urinary tract infection) 08/04/11   finishing abx tomorrow  . Weight loss    25 lb wt loss since 08/2010  . Wrist fracture, right     Family History  Problem Relation Age of Onset  . Diabetes Sister   . Diabetes Maternal Aunt        aunt and uncles  . Hypertension Sister        aunt and uncles maternal side  . Colon cancer Neg Hx   . Colon polyps Neg Hx   . Esophageal cancer Neg Hx   . Rectal cancer Neg Hx   . Stomach cancer Neg Hx     Past Surgical History:  Procedure Laterality Date  . COLONOSCOPY  2006?  . ESOPHAGOGASTRODUODENOSCOPY    . ESOPHAGOGASTRODUODENOSCOPY (EGD) WITH PROPOFOL N/A 05/09/2017   Procedure: ESOPHAGOGASTRODUODENOSCOPY (EGD) WITH PROPOFOL;  Surgeon: Doran Stabler, MD;  Location: [facility];  Service: Gastroenterology;  Laterality: N/A;  . EYE SURGERY Left   . EYE SURGERY Right    prostetic eye  .  foot srugery     removal of bone near small toe  . FRACTURE SURGERY Right    wrist  . HARDWARE REMOVAL  08/06/2011   Procedure: HARDWARE REMOVAL;  Surgeon: Newt Minion, MD;  Location: [facility];  Service: Orthopedics;  Laterality: Right;  Removal Deep Retained Hardware Right Distal Radius  . MULTIPLE TOOTH EXTRACTIONS     for dentures  . TOTAL HIP ARTHROPLASTY Left 06/06/2019   Procedure: LEFT TOTAL HIP ARTHROPLASTY-DIRECT ANTERIOR;  Surgeon: [name], MD;  Location: [facility];  Service: Orthopedics;  Laterality: Left;  . TOTAL HIP ARTHROPLASTY Right 01/07/2020   Procedure: RIGHT TOTAL HIP ARTHROPLASTY DIRECT ANTERIOR;  Surgeon: [name], MD;  Location: [facility];  Service: Orthopedics;  Laterality: Right;  . UPPER GASTROINTESTINAL ENDOSCOPY     Social History   Occupational History  . Occupation: disabled  Tobacco Use  . Smoking status: Current  Every Day Smoker    Packs/day: 0.25    Years: 17.00    Pack years: 4.25    Types: Cigars  . Smokeless tobacco: Never Used  . Tobacco comment: 2-3 cigars a day   Vaping Use  . Vaping Use: Never used  Substance and Sexual Activity  . Alcohol use: Yes    Comment: 1 pint a day of liquor per pt  . Drug use: Yes    Frequency: 7.0 times per week    Types: Marijuana    Comment: smokes marijuana 1 to 2 times daily  . Sexual activity: Not Currently

## 2020-03-17 ENCOUNTER — Ambulatory Visit: Payer: Medicare HMO | Admitting: Podiatry

## 2020-04-15 ENCOUNTER — Ambulatory Visit: Payer: Medicare HMO | Admitting: Podiatry

## 2020-08-22 ENCOUNTER — Inpatient Hospital Stay (HOSPITAL_COMMUNITY)
Admission: EM | Admit: 2020-08-22 | Discharge: 2020-08-25 | DRG: 440 | Disposition: A | Discharge status: Home / Self Care | Payer: Medicare HMO | Attending: Internal Medicine | Admitting: Internal Medicine

## 2020-08-22 ENCOUNTER — Emergency Department (HOSPITAL_COMMUNITY): Payer: Medicare HMO

## 2020-08-22 ENCOUNTER — Encounter (HOSPITAL_COMMUNITY): Payer: Self-pay | Admitting: Emergency Medicine

## 2020-08-22 DIAGNOSIS — Z20822 Contact with and (suspected) exposure to covid-19: Secondary | ICD-10-CM | POA: Diagnosis present

## 2020-08-22 DIAGNOSIS — F1020 Alcohol dependence, uncomplicated: Secondary | ICD-10-CM | POA: Diagnosis present

## 2020-08-22 DIAGNOSIS — F1090 Alcohol use, unspecified, uncomplicated: Secondary | ICD-10-CM

## 2020-08-22 DIAGNOSIS — R1011 Right upper quadrant pain: Secondary | ICD-10-CM | POA: Diagnosis not present

## 2020-08-22 DIAGNOSIS — H543 Unqualified visual loss, both eyes: Secondary | ICD-10-CM | POA: Diagnosis present

## 2020-08-22 DIAGNOSIS — E876 Hypokalemia: Secondary | ICD-10-CM | POA: Diagnosis present

## 2020-08-22 DIAGNOSIS — Q150 Congenital glaucoma: Secondary | ICD-10-CM | POA: Diagnosis not present

## 2020-08-22 DIAGNOSIS — Z7289 Other problems related to lifestyle: Secondary | ICD-10-CM

## 2020-08-22 DIAGNOSIS — Z8249 Family history of ischemic heart disease and other diseases of the circulatory system: Secondary | ICD-10-CM

## 2020-08-22 DIAGNOSIS — K852 Alcohol induced acute pancreatitis without necrosis or infection: Principal | ICD-10-CM | POA: Diagnosis present

## 2020-08-22 DIAGNOSIS — Z833 Family history of diabetes mellitus: Secondary | ICD-10-CM

## 2020-08-22 DIAGNOSIS — Z789 Other specified health status: Secondary | ICD-10-CM

## 2020-08-22 DIAGNOSIS — H548 Legal blindness, as defined in USA: Secondary | ICD-10-CM | POA: Diagnosis present

## 2020-08-22 DIAGNOSIS — R112 Nausea with vomiting, unspecified: Secondary | ICD-10-CM | POA: Diagnosis present

## 2020-08-22 DIAGNOSIS — F17290 Nicotine dependence, other tobacco product, uncomplicated: Secondary | ICD-10-CM | POA: Diagnosis present

## 2020-08-22 DIAGNOSIS — I10 Essential (primary) hypertension: Secondary | ICD-10-CM | POA: Diagnosis present

## 2020-08-22 DIAGNOSIS — K859 Acute pancreatitis without necrosis or infection, unspecified: Secondary | ICD-10-CM

## 2020-08-22 DIAGNOSIS — Z96643 Presence of artificial hip joint, bilateral: Secondary | ICD-10-CM | POA: Diagnosis present

## 2020-08-22 LAB — CBC WITH DIFFERENTIAL/PLATELET
Abs Immature Granulocytes: 0.03 10*3/uL (ref 0.00–0.07)
Basophils Absolute: 0 10*3/uL (ref 0.0–0.1)
Basophils Relative: 0 %
Eosinophils Absolute: 0 10*3/uL (ref 0.0–0.5)
Eosinophils Relative: 0 %
HCT: 44.4 % (ref 39.0–52.0)
Hemoglobin: 15.4 g/dL (ref 13.0–17.0)
Immature Granulocytes: 1 %
Lymphocytes Relative: 22 %
Lymphs Abs: 1.4 10*3/uL (ref 0.7–4.0)
MCH: 31.8 pg (ref 26.0–34.0)
MCHC: 34.7 g/dL (ref 30.0–36.0)
MCV: 91.5 fL (ref 80.0–100.0)
Monocytes Absolute: 0.6 10*3/uL (ref 0.1–1.0)
Monocytes Relative: 9 %
Neutro Abs: 4.3 10*3/uL (ref 1.7–7.7)
Neutrophils Relative %: 68 %
Platelets: 161 10*3/uL (ref 150–400)
RBC: 4.85 MIL/uL (ref 4.22–5.81)
RDW: 12.3 % (ref 11.5–15.5)
WBC: 6.3 10*3/uL (ref 4.0–10.5)
nRBC: 0 % (ref 0.0–0.2)

## 2020-08-22 LAB — COMPREHENSIVE METABOLIC PANEL
ALT: 14 U/L (ref 0–44)
AST: 25 U/L (ref 15–41)
Albumin: 4.1 g/dL (ref 3.5–5.0)
Alkaline Phosphatase: 91 U/L (ref 38–126)
Anion gap: 13 (ref 5–15)
BUN: 5 mg/dL — ABNORMAL LOW (ref 6–20)
CO2: 25 mmol/L (ref 22–32)
Calcium: 9.4 mg/dL (ref 8.9–10.3)
Chloride: 93 mmol/L — ABNORMAL LOW (ref 98–111)
Creatinine, Ser: 1.05 mg/dL (ref 0.61–1.24)
GFR, Estimated: >60 mL/min (ref >60)
Glucose, Bld: 141 mg/dL — ABNORMAL HIGH (ref 70–99)
Potassium: 3.9 mmol/L (ref 3.5–5.1)
Sodium: 131 mmol/L — ABNORMAL LOW (ref 135–145)
Total Bilirubin: 1 mg/dL (ref 0.3–1.2)
Total Protein: 7.5 g/dL (ref 6.5–8.1)

## 2020-08-22 LAB — URINALYSIS, ROUTINE W REFLEX MICROSCOPIC
Glucose, UA: NEGATIVE mg/dL
Ketones, ur: >80 mg/dL — AB
Leukocytes,Ua: NEGATIVE
Nitrite: NEGATIVE
Protein, ur: 100 mg/dL — AB
Specific Gravity, Urine: >1.03 — ABNORMAL HIGH (ref 1.005–1.030)
pH: 6.5 (ref 5.0–8.0)

## 2020-08-22 LAB — LIPASE, BLOOD: Lipase: 111 U/L — ABNORMAL HIGH (ref 11–51)

## 2020-08-22 LAB — SARS CORONAVIRUS 2 (TAT 6-24 HRS): SARS Coronavirus 2: NEGATIVE

## 2020-08-22 LAB — URINALYSIS, MICROSCOPIC (REFLEX)
RBC / HPF: >50 RBC/hpf (ref 0–5)
Squamous Epithelial / HPF: NONE SEEN (ref 0–5)

## 2020-08-22 LAB — LACTATE DEHYDROGENASE: LDH: 175 U/L (ref 98–192)

## 2020-08-22 LAB — PHOSPHORUS: Phosphorus: 3.2 mg/dL (ref 2.5–4.6)

## 2020-08-22 LAB — MAGNESIUM: Magnesium: 1.7 mg/dL (ref 1.7–2.4)

## 2020-08-22 MED ORDER — IOHEXOL 300 MG/ML  SOLN
100.0000 mL | Freq: Once | INTRAMUSCULAR | Status: AC | PRN
  Administered 2020-08-22: 100 mL via INTRAVENOUS

## 2020-08-22 MED ORDER — ENOXAPARIN SODIUM 40 MG/0.4ML ~~LOC~~ SOLN
40.0000 mg | SUBCUTANEOUS | Status: DC
  Administered 2020-08-22 – 2020-08-24 (×3): 40 mg via SUBCUTANEOUS
  Filled 2020-08-22 (×3): qty 0.4

## 2020-08-22 MED ORDER — POTASSIUM CHLORIDE IN NACL 20-0.9 MEQ/L-% IV SOLN
INTRAVENOUS | Status: AC
  Filled 2020-08-22 (×5): qty 1000

## 2020-08-22 MED ORDER — MORPHINE SULFATE (PF) 4 MG/ML IV SOLN
4.0000 mg | Freq: Once | INTRAVENOUS | Status: AC
Start: 2020-08-22 — End: 2020-08-22
  Administered 2020-08-22: 4 mg via INTRAVENOUS
  Filled 2020-08-22: qty 1

## 2020-08-22 MED ORDER — ONDANSETRON HCL 4 MG/2ML IJ SOLN
4.0000 mg | Freq: Four times a day (QID) | INTRAMUSCULAR | Status: DC | PRN
  Administered 2020-08-22 – 2020-08-23 (×5): 4 mg via INTRAVENOUS
  Filled 2020-08-22 (×7): qty 2

## 2020-08-22 MED ORDER — HYDROMORPHONE HCL 1 MG/ML IJ SOLN
1.0000 mg | Freq: Once | INTRAMUSCULAR | Status: AC
  Administered 2020-08-22: 1 mg via INTRAVENOUS
  Filled 2020-08-22: qty 1

## 2020-08-22 MED ORDER — SODIUM CHLORIDE 0.9 % IV BOLUS
1000.0000 mL | Freq: Once | INTRAVENOUS | Status: AC
  Administered 2020-08-22: 1000 mL via INTRAVENOUS

## 2020-08-22 MED ORDER — LORAZEPAM 1 MG PO TABS: 1.0000 mg | ORAL_TABLET | ORAL | Status: DC | PRN

## 2020-08-22 MED ORDER — LORAZEPAM 2 MG/ML IJ SOLN: 1.0000 mg | INTRAMUSCULAR | Status: DC | PRN

## 2020-08-22 MED ORDER — METOCLOPRAMIDE HCL 5 MG/ML IJ SOLN
10.0000 mg | Freq: Once | INTRAMUSCULAR | Status: AC
  Administered 2020-08-22: 10 mg via INTRAVENOUS
  Filled 2020-08-22: qty 2

## 2020-08-22 MED ORDER — HYDROMORPHONE HCL 1 MG/ML IJ SOLN
0.5000 mg | INTRAMUSCULAR | Status: DC | PRN
  Administered 2020-08-22 – 2020-08-25 (×12): 1 mg via INTRAVENOUS
  Filled 2020-08-22 (×13): qty 1

## 2020-08-22 MED ORDER — ONDANSETRON HCL 4 MG/2ML IJ SOLN
4.0000 mg | Freq: Once | INTRAMUSCULAR | Status: AC
  Administered 2020-08-22: 4 mg via INTRAVENOUS
  Filled 2020-08-22: qty 2

## 2020-08-22 MED ORDER — THIAMINE HCL 100 MG/ML IJ SOLN
100.0000 mg | Freq: Every day | INTRAMUSCULAR | Status: DC
  Administered 2020-08-22 – 2020-08-25 (×4): 100 mg via INTRAVENOUS
  Filled 2020-08-22 (×4): qty 2

## 2020-08-22 NOTE — ED Notes (Signed)
Pt tolerated PO challenge poorly. Pt reports nausea and pain. Notified [place] PA

## 2020-08-22 NOTE — ED Provider Notes (Signed)
[facility] Provider Note   CSN: [id number] Arrival date & time: 08/22/20  0703     History No chief complaint on file.   Austin [name] is a 55 y.o. male with PMHx HTN (not currently on BP meds), blindness, GERD, and hx of alcoholic pancreatitis who presents to the ED today with complaint of gradual onset, constant, sharp, diffuse abdominal pain (worse in the epigastrium/RUQ) for the past 2 days. Pt also complains of nausea and NBNB emesis (~ 7 episodes yesterday and 3 today). No diarrhea - last normal bowel movement 2-3 days ago. Per chart review pt was admitted in April 9937 for alcoholic pancreatitis - he states this feels different because last time he was not vomiting. He states he has tried to cut back on his alcohol and typically drinks 1 beer every other day and maybe 2 liquor drinks per week. He does admit to eating a lot of fried foods lately - ate a burger and fries the night before this pain started. He also felt feverish last night but did not check his temperature. Temp in the ED 98.9. Pt denies chest pain, SOB, diarrhea, urinary symptoms, testicular pain, or any other associated symptoms.   The history is provided by the patient and medical records.       Past Medical History:  Diagnosis Date  . Alcohol abuse   . Anxiety   . Arthritis   . 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 Elevated LFTs   . Foot and toe(s), blister    bilat feet, blistering between toes  . GERD (gastroesophageal reflux disease)   . Glaucoma   . Glaucoma as birth trauma   . Headache(784.0)   . Hypertension    hx of  . Insomnia   . Pancreatitis   . UTI (lower urinary tract infection) 08/04/11   finishing abx tomorrow  . Weight loss    25 lb wt loss since 08/2010  . Wrist fracture, right     Patient Active Problem List   Diagnosis Date Noted  . Hx of total hip arthroplasty, 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  . History of total hip arthroplasty, left  03/11/2020  . Acute pancreatitis 10/05/2019  . 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 Intractable nausea and vomiting 12/29/2018  . Alcoholic pancreatitis 16/96/7893  . Essential hypertension 04/14/2018  . Blindness of both eyes 04/14/2018  . Alcohol use 04/14/2018  . Hyponatremia 04/14/2018  . RUQ pain   . Abnormal CT of the abdomen   . 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 COLONOSCOPY  2006?  . ESOPHAGOGASTRODUODENOSCOPY    . ESOPHAGOGASTRODUODENOSCOPY (EGD) WITH PROPOFOL N/A 05/09/2017   Procedure: ESOPHAGOGASTRODUODENOSCOPY (EGD) WITH PROPOFOL;  Surgeon: Doran Stabler, MD;  Location: [facility];  Service: Gastroenterology;  Laterality: N/A;  . EYE SURGERY Left   . EYE SURGERY Right    prostetic eye  . foot srugery     removal of bone near small toe  . FRACTURE SURGERY Right    wrist  . HARDWARE REMOVAL  08/06/2011   Procedure: HARDWARE REMOVAL;  Surgeon: Newt Minion, MD;  Location: [facility];  Service: Orthopedics;  Laterality: Right;  Removal Deep Retained Hardware Right Distal Radius  . MULTIPLE TOOTH EXTRACTIONS     for dentures  . TOTAL HIP ARTHROPLASTY  Left 06/06/2019   Procedure: LEFT TOTAL HIP ARTHROPLASTY-DIRECT ANTERIOR;  Surgeon: [name], MD;  Location: [facility];  Service: Orthopedics;  Laterality: Left;  . TOTAL HIP ARTHROPLASTY Right 01/07/2020   Procedure: RIGHT TOTAL HIP ARTHROPLASTY DIRECT ANTERIOR;  Surgeon: [name], MD;  Location: [facility];  Service: Orthopedics;  Laterality: Right;  . UPPER GASTROINTESTINAL ENDOSCOPY         Family History  Problem Relation Age of Onset  . Diabetes Sister   . Diabetes Maternal Aunt        aunt and uncles  . Hypertension Sister        aunt and uncles maternal side  . Colon cancer Neg Hx   . Colon polyps Neg Hx   . Esophageal cancer Neg Hx   . Rectal cancer Neg Hx   .  Stomach cancer Neg Hx     Social History   Tobacco Use  . Smoking status: Current Every Day Smoker    Packs/day: 0.25    Years: 17.00    Pack years: 4.25    Types: Cigars  . Smokeless tobacco: Never Used  . Tobacco comment: 2-3 cigars a day   Vaping Use  . Vaping Use: Never used  Substance Use Topics  . Alcohol use: Yes    Comment: 1 pint a day of liquor per pt  . Drug use: Yes    Frequency: 7.0 times per week    Types: Marijuana    Comment: smokes marijuana 1 to 2 times daily    Home Medications Prior to Admission medications   Medication Sig Start Date End Date Taking? Authorizing Provider  ascorbic acid (VITAMIN C) 500 MG tablet Take 500 mg by mouth daily.    [provider]  aspirin EC 325 MG tablet Take 1 tablet (325 mg total) by mouth daily. MUST TAKE AT LEAST 4 WEEKS POSTOP FOR DVT PROPHYLAXIS Patient not taking: Reported on 02/08/2020 01/07/20   Lanae Crumbly, PA-C  diclofenac (VOLTAREN) 75 MG EC tablet Take 1 tablet (75 mg total) by mouth 2 (two) times daily as needed. Patient not taking: Reported on 02/26/2020 02/19/20   Lanae Crumbly, PA-C  folic acid (FOLVITE) 1 MG tablet Take 1 mg by mouth daily. 10/01/19   [provider]  HYDROcodone-acetaminophen (NORCO/VICODIN) 5-325 MG tablet Take 1 tablet by mouth every 6 (six) hours as needed for moderate pain. Patient not taking: Reported on 02/26/2020 01/22/20   [name], MD  methocarbamol (ROBAXIN) 500 MG tablet Take 1 tablet (500 mg total) by mouth every 6 (six) hours as needed for muscle spasms. Patient not taking: Reported on 02/19/2020 01/07/20   Lanae Crumbly, PA-C  ondansetron (ZOFRAN) 4 MG tablet Take 4 mg by mouth every 8 (eight) hours as needed for nausea or vomiting.  Patient not taking: Reported on 02/08/2020 10/02/19   [provider]  OVER THE COUNTER MEDICATION Take 2 tablets by mouth daily. Sea moss otc supplement Patient not taking: Reported on 02/26/2020    [provider]   oxyCODONE-acetaminophen (PERCOCET/ROXICET) 5-325 MG tablet Take 1 tablet by mouth every 4 (four) hours as needed for severe pain. Patient not taking: Reported on 02/19/2020 01/07/20   Lanae Crumbly, PA-C  VITAMIN D PO Take 1 tablet by mouth daily.     [provider]  vitamin E 180 MG (400 UNITS) capsule Take 400 Units by mouth daily. 10/12/19   [provider]  Zinc Sulfate (ZINC 15  PO) Take 30 mg by mouth daily. Patient not taking: Reported on 02/26/2020    [provider]    Allergies    Chlorhexidine  Review of Systems   Review of Systems  Constitutional: Positive for fever (subjective). Negative for chills.  Respiratory: Negative for cough and shortness of breath.   Cardiovascular: Negative for chest pain.  Gastrointestinal: Positive for abdominal pain, nausea and vomiting. Negative for diarrhea.  Genitourinary: Negative for dysuria, frequency and testicular pain.  All other systems reviewed and are negative.   Physical Exam Updated Vital Signs BP (!) 174/99 (BP Location: Right Arm)   Pulse (!) 59   Temp 98.9 F (37.2 C)   Resp 20   SpO2 99%   Physical Exam Vitals and nursing note reviewed.  Constitutional:      Appearance: He is not ill-appearing.     Comments: Blind  HENT:     Head: Normocephalic and atraumatic.     Mouth/Throat:     Mouth: Mucous membranes are dry.  Cardiovascular:     Rate and Rhythm: Normal rate and regular rhythm.     Pulses: Normal pulses.  Pulmonary:     Effort: Pulmonary effort is normal.     Breath sounds: Normal breath sounds. No wheezing, rhonchi or rales.  Abdominal:     Palpations: Abdomen is soft.     Tenderness: There is abdominal tenderness. There is no guarding or rebound.     Comments: Soft, diffuse abdominal TTP however worse in the epigastrium and RUQ,, +BS throughout, no r/g/r, neg murphy's, neg mcburney's, no CVA TTP  Musculoskeletal:     Cervical back: Neck supple.  Skin:    General: Skin is warm  and dry.  Neurological:     Mental Status: He is alert.     ED Results / Procedures / Treatments   Labs (all labs ordered are listed, but only abnormal results are displayed) Labs Reviewed  COMPREHENSIVE METABOLIC PANEL - Abnormal; Notable for the following components:      Result Value   Sodium 131 (*)    Chloride 93 (*)    Glucose, Bld 141 (*)    BUN 5 (*)    All other components within normal limits  LIPASE, BLOOD - Abnormal; Notable for the following components:   Lipase 111 (*)    All other components within normal limits  URINALYSIS, ROUTINE W REFLEX MICROSCOPIC - Abnormal; Notable for the following components:   Specific Gravity, Urine >1.030 (*)    Hgb urine dipstick LARGE (*)    Bilirubin Urine SMALL (*)    Ketones, ur >80 (*)    Protein, ur 100 (*)    All other components within normal limits  URINALYSIS, MICROSCOPIC (REFLEX) - Abnormal; Notable for the following components:   Bacteria, UA RARE (*)    All other components within normal limits  SARS CORONAVIRUS 2 (TAT 6-24 HRS)  CBC WITH DIFFERENTIAL/PLATELET  LACTATE DEHYDROGENASE  HAPTOGLOBIN    EKG None  Radiology CT Abdomen Pelvis W Contrast  Result Date: 08/22/2020 CLINICAL DATA:  Right upper quadrant abdominal pain for 2 days. EXAM: CT ABDOMEN AND PELVIS WITH CONTRAST TECHNIQUE: Multidetector CT imaging of the abdomen and pelvis was performed using the standard protocol following bolus administration of intravenous contrast. CONTRAST:  100 cc Omnipaque 300 COMPARISON:  10/04/2019 FINDINGS: Lower chest: Patchy dependent subpleural atelectasis. No infiltrates or effusions. No worrisome pulmonary lesions. The heart is normal in size. No pericardial effusion. Hepatobiliary: No hepatic lesions or  intrahepatic biliary dilatation. The gallbladder is unremarkable. No common bile duct dilatation. Pancreas: Mild inflammation around the lower or pancreatic head/uncinate process region could suggest focal pancreatitis or  it could be related to duodenitis. No pancreatic mass or ductal dilatation. Spleen: Normal size.  No focal lesions. Adrenals/Urinary Tract: The adrenal glands and kidneys are unremarkable. The bladder is normal. Stomach/Bowel: The stomach is moderately distended with fluid and air. There is wall thickening and mild mucosal enhancement of the duodenum. Findings suggest duodenitis but this could also be secondary to pancreatitis. The small bowel and colon are grossly normal. Vascular/Lymphatic: Age advanced vascular calcifications but no aneurysm or dissection. No mesenteric or retroperitoneal adenopathy. No pelvic adenopathy. 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 Other: No free pelvic fluid collections or inguinal hernias. Stable periumbilical abdominal wall hernia containing fat. Musculoskeletal: No significant bony findings. IMPRESSION: 1. Focal pancreatitis involving the lower pancreatic head and uncinate process versus duodenitis. Recommend clinical correlation with lipase level, etc. 2. Moderate distention of the stomach likely due to the above. 3. No other acute abdominal/pelvic findings and no mass lesions or adenopathy. 4. Age advanced atherosclerotic calcifications involving the aorta and iliac arteries. Electronically Signed   By: Marijo Sanes M.D.   On: 08/22/2020 15:14   US Abdomen Limited RUQ (LIVER/GB)  Result Date: 08/22/2020 CLINICAL DATA:  Right upper quadrant pain EXAM: ULTRASOUND ABDOMEN LIMITED RIGHT UPPER QUADRANT COMPARISON:  None. FINDINGS: Gallbladder: No gallstones or wall thickening visualized. No sonographic Murphy sign noted by sonographer. Common bile duct: Diameter: 3 mm, normal Liver: No focal lesion identified. Within normal limits in parenchymal echogenicity. Portal vein is patent on color Doppler imaging with normal direction of blood flow towards the liver. Other: None.  IMPRESSION: Normal right upper quadrant ultrasound. Electronically Signed   By: Macy Mis M.D.   On: 08/22/2020 11:05    Procedures Procedures   Medications Ordered in ED Medications  sodium chloride 0.9 % bolus 1,000 mL (0 mLs Intravenous Stopped 08/22/20 1503)  ondansetron (ZOFRAN) injection 4 mg (4 mg Intravenous Given 08/22/20 1019)  morphine 4 MG/ML injection 4 mg (4 mg Intravenous Given 08/22/20 1019)  HYDROmorphone (DILAUDID) injection 1 mg (1 mg Intravenous Given 08/22/20 1104)  metoCLOPramide (REGLAN) injection 10 mg (10 mg Intravenous Given 08/22/20 1503)  iohexol (OMNIPAQUE) 300 MG/ML solution 100 mL (100 mLs Intravenous Contrast Given 08/22/20 1343)    ED Course  I have reviewed the triage vital signs and the nursing notes.  Pertinent labs & imaging results that were available during my care of the patient were reviewed by me and considered in my medical decision making (see chart for details).    MDM Rules/Calculators/A&P                          55 year old male who is legally blind presenting to the ED today with complaints of diffuse abdominal pain, nausea, nonbloody nonbilious emesis for the past 2 days.  History of alcoholic pancreatitis however reports that he has tried to significantly cut back on alcohol use.  On arrival to the ED vitals are stable.  Patient is afebrile.  He does report feeling subjectively febrile yesterday.  His blood pressure is elevated today 174/99, he states he used to be on antihypertensives however was taken off several months ago.  May be elevated secondary to pain at this time.  On exam he has  diffuse abdominal tenderness palpation however worse in the epigastrium and right upper quadrant.  No previous past surgical history.  Lab work was obtained while patient was in the waiting room with findings of a lipase of 111.  LFTs unremarkable at this time.  He also has a sodium of 131 and chloride of 93.  Creatinine stable at 1.05 with a BUN of 5.  He  is dry on exam.  Will provide fluids, antiemetics, pain medication.  Patient states he last drank 1 beer Wednesday however does admit to eating a lot of fried foods recently with eating a burger and [place] fries the night before his pain started.  Given his pain is worse in the right upper quadrant and patient still has a gallbladder will obtain a right upper quadrant ultrasound at this time as his lipase level may be elevated secondary to possible choledocholithiasis.  If no acute findings on ultrasound will treat for pancreatitis.  If patient able to tolerate p.o. in the ED will discharge home with pain medicine and GI follow-up.  Ultrasound negative for any acute findings  U/A with increased specific gravity. Large hgb with > 50 RBCs; does appear pt has had RBCs in his urine in the past. Ketones > 80 and protein 100 with small bilirubin. LFts again unremarkable however in the setting of RUQ pain and bilirubin in urine wil obtain LDG and haptoglobin to ensure pt is not hemolyzing. Will add on CT scan at this time. Have attempted to PO challenge pt  However he took a very small sip of water and reported worsening pain and nausea. Pt will need admission for fluid resuscitation and IV antiemetics until he can tolerate PO.   CT scan read with findings of acute pancreatitis without other abnormalities   LDH negative  Discussed case with [facility] Dr. Harlen Labs who agrees to evaluate patient for admission. Is requesting CXR, have ordered. Appreciate her involvement.   This note was prepared using Dragon voice recognition software and may include unintentional dictation errors due to the inherent limitations of voice recognition software.  Final Clinical Impression(s) / ED Diagnoses Final diagnoses:  RUQ abdominal pain  Acute pancreatitis without infection or necrosis, unspecified pancreatitis type    Rx / DC Orders ED Discharge Orders    None       Eustaquio Maize, PA-C 08/22/20  1617    Carmin Muskrat, MD 08/25/20 [phone]

## 2020-08-22 NOTE — ED Notes (Signed)
Hospitalist at bedside 

## 2020-08-22 NOTE — ED Notes (Signed)
Pt given water for PO/Fluid challenge. Pt stated that he felt nauseous after drinking it and complains of pain in the upper gastric region. Pain score was 8/10.

## 2020-08-22 NOTE — H&P (Signed)
History and PhysicalMARQUETTE [name]   GEX:[id number] DOB: 1965/07/06 DOA: 08/22/2020  Referring MD/provider: PA Alroy Bailiff PCP: Buzzy Han, MD   Patient coming from: Home  Chief Complaint: Abdominal pain and intractable nausea and vomiting  History of Present Illness:   Austin [name] is an 55 y.o. 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  Patient states he was in his usual state of health until 2 days ago when he developed onset of anorexia followed by epigastric and right upper quadrant pain.  He says he tries to eat and was subsequently had increased pain.  He subsequently developed nausea and vomiting and was unable to tolerate p.o.'s not even water.  Abdominal pain worsened to the point where he needed to come to the ED.  Patient states this is worse than his previous episodes of pancreatitis and that the pain is much more intense.  Also notes that he has not had vomiting with previous episodes of pancreatitis.  Patient's last full meal was 2 days ago.  Patient denies fevers or chills.  No blood in the vomitus.  Patient initially said he only drank 1-2 beers every other day however on repeated questioning patient admits to drinking 3-4 drinks of vodka every day or every other day.  Patient states he has been trying to cut down but finds it difficult.  ED Course:  The patient was noted to be afebrile, hypertensive with no oxygen requirement.  Laboratory data were notable for sodium of 131, lipase of 111 and normal creatinine.  Patient had no leukocytosis.  Right upper quadrant ultrasound was unrevealing.  CT of abdomen pelvis revealed focal pancreatitis in the head and uncinate and or duodenitis.  Patient was treated with IV fluid resuscitation and symptomatic management of pain and vomiting however vomited after every  fluid challenge.  Patient was unable to keep p.o.'s at all despite use of Zofran and narcotics.  Patient is now admitted for treatment of focal alcoholic pancreatitis of the pancreatic head.  ROS:   ROS   Review of Systems: General: Denies fever, chills, malaise,  Eyes: Denies recent change in vision, no discharge, redness, pain noted Respiratory: Denies cough, SOB at rest or hemoptysis Cardiovascular: Denies chest pain or palpitations GU: Denies dysuria, frequency or hematuria   Past Medical History:   Past Medical History:  Diagnosis Date  . Alcohol abuse   . Anxiety   . Arthritis   . 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 Elevated LFTs   . Foot and toe(s), blister    bilat feet, blistering between toes  . GERD (gastroesophageal reflux disease)   . Glaucoma   . Glaucoma as birth trauma   . Headache(784.0)   . Hypertension    hx of  . Insomnia   . Pancreatitis   . UTI (lower urinary tract infection) 08/04/11   finishing abx tomorrow  . Weight loss    25 lb wt loss since 08/2010  . Wrist fracture, right     Past Surgical History:   Past Surgical History:  Procedure Laterality Date  . COLONOSCOPY  2006?  . ESOPHAGOGASTRODUODENOSCOPY    . ESOPHAGOGASTRODUODENOSCOPY (EGD) WITH PROPOFOL N/A 05/09/2017   Procedure: ESOPHAGOGASTRODUODENOSCOPY (EGD) WITH PROPOFOL;  Surgeon: Doran Stabler, MD;  Location: [facility];  Service: Gastroenterology;  Laterality: N/A;  .  EYE SURGERY Left   . EYE SURGERY Right    prostetic eye  . foot srugery     removal of bone near small toe  . FRACTURE SURGERY Right    wrist  . HARDWARE REMOVAL  08/06/2011   Procedure: HARDWARE REMOVAL;  Surgeon: Newt Minion, MD;  Location: [facility];  Service: Orthopedics;  Laterality: Right;  Removal Deep Retained Hardware Right Distal Radius  . MULTIPLE TOOTH EXTRACTIONS     for dentures  . TOTAL HIP ARTHROPLASTY Left 06/06/2019   Procedure: LEFT TOTAL HIP ARTHROPLASTY-DIRECT ANTERIOR;   Surgeon: [name], MD;  Location: [facility];  Service: Orthopedics;  Laterality: Left;  . TOTAL HIP ARTHROPLASTY Right 01/07/2020   Procedure: RIGHT TOTAL HIP ARTHROPLASTY DIRECT ANTERIOR;  Surgeon: [name], MD;  Location: [facility];  Service: Orthopedics;  Laterality: Right;  . UPPER GASTROINTESTINAL ENDOSCOPY      Social History:   Social History   Socioeconomic History  . Marital status: Significant Other    Spouse name: Not on file  . Number of children: 1  . Years of education: Not on file  . Highest education level: Not on file  Occupational History  . Occupation: disabled  Tobacco Use  . Smoking status: Current Every Day Smoker    Packs/day: 0.25    Years: 17.00    Pack years: 4.25    Types: Cigars  . Smokeless tobacco: Never Used  . Tobacco comment: 2-3 cigars a day   Vaping Use  . Vaping Use: Never used  Substance and Sexual Activity  . Alcohol use: Yes    Comment: 1 pint a day of liquor per pt  . Drug use: Yes    Frequency: 7.0 times per week    Types: Marijuana    Comment: smokes marijuana 1 to 2 times daily  . Sexual activity: Not Currently  Other Topics Concern  . Not on file  Social History Narrative  . Not on file   Social Determinants of Health   Financial Resource Strain: Not on file  Food Insecurity: Not on file  Transportation Needs: Not on file  Physical Activity: Not on file  Stress: Not on file  Social Connections: Not on file  Intimate Partner Violence: Not on file    Allergies   Chlorhexidine  Family history:   Family History  Problem Relation Age of Onset  . Diabetes Sister   . Diabetes Maternal Aunt        aunt and uncles  . Hypertension Sister        aunt and uncles maternal side  . Colon cancer Neg Hx   . Colon polyps Neg Hx   . Esophageal cancer Neg Hx   . Rectal cancer Neg Hx   . Stomach cancer Neg Hx     Current Medications:   Prior to Admission medications   Medication Sig Start Date End Date Taking?  Authorizing Provider  ascorbic acid (VITAMIN C) 500 MG tablet Take 500 mg by mouth daily.    [provider]  aspirin EC 325 MG tablet Take 1 tablet (325 mg total) by mouth daily. MUST TAKE AT LEAST 4 WEEKS POSTOP FOR DVT PROPHYLAXIS Patient not taking: Reported on 02/08/2020 01/07/20   Lanae Crumbly, PA-C  diclofenac (VOLTAREN) 75 MG EC tablet Take 1 tablet (75 mg total) by mouth 2 (two) times daily as needed. Patient not taking: Reported on 02/26/2020 02/19/20   Lanae Crumbly, PA-C  folic acid (  FOLVITE) 1 MG tablet Take 1 mg by mouth daily. 10/01/19   [provider]  HYDROcodone-acetaminophen (NORCO/VICODIN) 5-325 MG tablet Take 1 tablet by mouth every 6 (six) hours as needed for moderate pain. Patient not taking: Reported on 02/26/2020 01/22/20   [name], MD  methocarbamol (ROBAXIN) 500 MG tablet Take 1 tablet (500 mg total) by mouth every 6 (six) hours as needed for muscle spasms. Patient not taking: Reported on 02/19/2020 01/07/20   Lanae Crumbly, PA-C  ondansetron (ZOFRAN) 4 MG tablet Take 4 mg by mouth every 8 (eight) hours as needed for nausea or vomiting.  Patient not taking: Reported on 02/08/2020 10/02/19   [provider]  OVER THE COUNTER MEDICATION Take 2 tablets by mouth daily. Sea moss otc supplement Patient not taking: Reported on 02/26/2020    [provider]  oxyCODONE-acetaminophen (PERCOCET/ROXICET) 5-325 MG tablet Take 1 tablet by mouth every 4 (four) hours as needed for severe pain. Patient not taking: Reported on 02/19/2020 01/07/20   Lanae Crumbly, PA-C  VITAMIN D PO Take 1 tablet by mouth daily.     [provider]  vitamin E 180 MG (400 UNITS) capsule Take 400 Units by mouth daily. 10/12/19   [provider]  Zinc Sulfate (ZINC 15 PO) Take 30 mg by mouth daily. Patient not taking: Reported on 02/26/2020    [provider]    Physical Exam:   Vitals:   08/22/20 1500 08/22/20 1515 08/22/20 1530 08/22/20 1600  BP:  (!) 172/98 (!) 184/95 (!) 178/97 (!) 162/96  Pulse:    63  Resp: 16 15 16 19   Temp:      SpO2:    97%  Weight:      Height:         Physical Exam: Blood pressure (!) 162/96, pulse 63, temperature 98.9 F (37.2 C), resp. rate 19, height 5\' 7"  (1.702 m), weight 68.5 kg, SpO2 97 %. Gen: Blind man lying in bed in no acute distress with attentive daughter at bedside, daughter is also blind. Eyes: Proptosis bilaterally left greater than right with clouding of cornea bilaterally.  Patient is clearly blind.  Mucous membranes are moist. CVS: S1-S2, regular, no gallops Respiratory:  decreased air entry likely secondary to decreased inspiratory effort GI: Patient has bowel sounds although they are diminished, of normal pitched.  Abdomen is somewhat distended.  There is no voluntary guarding.  He has tenderness to light palpation in the epigastric and right upper quadrant region.  There is focal rebound in that area as well. LE: No edema. No cyanosis Neuro: grossly nonfocal.  Psych: mood and affect appropriate to situation.   Data Review:    Labs: Basic Metabolic Panel: Recent Labs  Lab 08/22/20 0719  NA 131*  K 3.9  CL 93*  CO2 25  GLUCOSE 141*  BUN 5*  CREATININE 1.05  CALCIUM 9.4   Liver Function Tests: Recent Labs  Lab 08/22/20 0719  AST 25  ALT 14  ALKPHOS 91  BILITOT 1.0  PROT 7.5  ALBUMIN 4.1   Recent Labs  Lab 08/22/20 0719  LIPASE 111*   No results for input(s): AMMONIA in the last 168 hours. CBC: Recent Labs  Lab 08/22/20 0719  WBC 6.3  NEUTROABS 4.3  HGB 15.4  HCT 44.4  MCV 91.5  PLT 161   Cardiac Enzymes: No results for input(s): CKTOTAL, CKMB, CKMBINDEX, TROPONINI in the last 168 hours.  BNP (last 3 results) No results for  input(s): PROBNP in the last 8760 hours. CBG: No results for input(s): GLUCAP in the last 168 hours.  Urinalysis    Component Value Date/Time   COLORURINE YELLOW 08/22/2020 1132   APPEARANCEUR CLEAR 08/22/2020 1132    LABSPEC >1.030 (H) 08/22/2020 1132   PHURINE 6.5 08/22/2020 1132   GLUCOSEU NEGATIVE 08/22/2020 1132   HGBUR LARGE (A) 08/22/2020 1132   BILIRUBINUR SMALL (A) 08/22/2020 1132   KETONESUR >80 (A) 08/22/2020 1132   PROTEINUR 100 (A) 08/22/2020 1132   UROBILINOGEN 2.0 (H) 09/13/2016 1039   NITRITE NEGATIVE 08/22/2020 1132   LEUKOCYTESUR NEGATIVE 08/22/2020 1132      Radiographic Studies: CT Abdomen Pelvis W Contrast  Result Date: 08/22/2020 CLINICAL DATA:  Right upper quadrant abdominal pain for 2 days. EXAM: CT ABDOMEN AND PELVIS WITH CONTRAST TECHNIQUE: Multidetector CT imaging of the abdomen and pelvis was performed using the standard protocol following bolus administration of intravenous contrast. CONTRAST:  100 cc Omnipaque 300 COMPARISON:  10/04/2019 FINDINGS: Lower chest: Patchy dependent subpleural atelectasis. No infiltrates or effusions. No worrisome pulmonary lesions. The heart is normal in size. No pericardial effusion. Hepatobiliary: No hepatic lesions or intrahepatic biliary dilatation. The gallbladder is unremarkable. No common bile duct dilatation. Pancreas: Mild inflammation around the lower or pancreatic head/uncinate process region could suggest focal pancreatitis or it could be related to duodenitis. No pancreatic mass or ductal dilatation. Spleen: Normal size.  No focal lesions. Adrenals/Urinary Tract: The adrenal glands and kidneys are unremarkable. The bladder is normal. Stomach/Bowel: The stomach is moderately distended with fluid and air. There is wall thickening and mild mucosal enhancement of the duodenum. Findings suggest duodenitis but this could also be secondary to pancreatitis. The small bowel and colon are grossly normal. Vascular/Lymphatic: Age advanced vascular calcifications but no aneurysm or dissection. No mesenteric or retroperitoneal adenopathy. No pelvic adenopathy. Reproductive: The prostate gland and seminal vesicles are grossly normal but exam limited by  significant artifact from bilateral hip prosthesis. Other: No free pelvic fluid collections or inguinal hernias. Stable periumbilical abdominal wall hernia containing fat. Musculoskeletal: No significant bony findings. IMPRESSION: 1. Focal pancreatitis involving the lower pancreatic head and uncinate process versus duodenitis. Recommend clinical correlation with lipase level, etc. 2. Moderate distention of the stomach likely due to the above. 3. No other acute abdominal/pelvic findings and no mass lesions or adenopathy. 4. Age advanced atherosclerotic calcifications involving the aorta and iliac arteries. Electronically Signed   By: Marijo Sanes M.D.   On: 08/22/2020 15:14   US Abdomen Limited RUQ (LIVER/GB)  Result Date: 08/22/2020 CLINICAL DATA:  Right upper quadrant pain EXAM: ULTRASOUND ABDOMEN LIMITED RIGHT UPPER QUADRANT COMPARISON:  None. FINDINGS: Gallbladder: No gallstones or wall thickening visualized. No sonographic Murphy sign noted by sonographer. Common bile duct: Diameter: 3 mm, normal Liver: No focal lesion identified. Within normal limits in parenchymal echogenicity. Portal vein is patent on color Doppler imaging with normal direction of blood flow towards the liver. Other: None. IMPRESSION: Normal right upper quadrant ultrasound. Electronically Signed   By: Macy Mis M.D.   On: 08/22/2020 11:05    EKG:  Ordered and pending   Assessment/Plan:   Principal Problem:   Alcoholic pancreatitis Active Problems:   Essential hypertension   Blindness of both eyes   Alcohol use   Intractable nausea and vomiting   Acute alcoholic pancreatitis  55 year old man with blindness, HTN is admitted with recurrent alcoholic pancreatitis.  Alcoholic pancreatitis Focal pancreatitis seen on CT Treat conservatively with bowel rest and fluid  resuscitation Initial lipase 111, repeat in the morning Hydromorphone for pain management Zofran for management of vomiting and nausea  Alcohol  use Patient initially said he was only drinking 1 beer every other day however subsequently admitted to drinking 3-4 vodkas every day or every other day. Discussed how essential it is that he stop drinking any alcohol at all, daughter at bedside agreed. We will place patient on CIWA protocol  HTN Patient apparently does not take any antihypertensives at home He believes his BP is elevated here due to pain Follow BP, can start antihypertensives as warranted   Other information:   DVT prophylaxis: lovenox ordered. Code Status: full Family Communication: daughter at bedside  Disposition Plan: home Consults called: none Admission status: inpatient   [facility]  If 7PM-7AM, please contact night-coverage [email or web address] Password [facility] 08/22/2020, 4:44 PM

## 2020-08-22 NOTE — ED Triage Notes (Signed)
Pt here with c/o abd pain and n/v over the last 2 days

## 2020-08-23 DIAGNOSIS — R112 Nausea with vomiting, unspecified: Secondary | ICD-10-CM

## 2020-08-23 DIAGNOSIS — H543 Unqualified visual loss, both eyes: Secondary | ICD-10-CM

## 2020-08-23 LAB — COMPREHENSIVE METABOLIC PANEL
ALT: 14 U/L (ref 0–44)
AST: 19 U/L (ref 15–41)
Albumin: 3.4 g/dL — ABNORMAL LOW (ref 3.5–5.0)
Alkaline Phosphatase: 76 U/L (ref 38–126)
Anion gap: 7 (ref 5–15)
BUN: 6 mg/dL (ref 6–20)
CO2: 25 mmol/L (ref 22–32)
Calcium: 8.6 mg/dL — ABNORMAL LOW (ref 8.9–10.3)
Chloride: 98 mmol/L (ref 98–111)
Creatinine, Ser: 1.09 mg/dL (ref 0.61–1.24)
GFR, Estimated: >60 mL/min (ref >60)
Glucose, Bld: 91 mg/dL (ref 70–99)
Potassium: 3.9 mmol/L (ref 3.5–5.1)
Sodium: 130 mmol/L — ABNORMAL LOW (ref 135–145)
Total Bilirubin: 1.2 mg/dL (ref 0.3–1.2)
Total Protein: 6.3 g/dL — ABNORMAL LOW (ref 6.5–8.1)

## 2020-08-23 LAB — CBC
HCT: 40.8 % (ref 39.0–52.0)
Hemoglobin: 14.5 g/dL (ref 13.0–17.0)
MCH: 31.9 pg (ref 26.0–34.0)
MCHC: 35.5 g/dL (ref 30.0–36.0)
MCV: 89.7 fL (ref 80.0–100.0)
Platelets: 150 10*3/uL (ref 150–400)
RBC: 4.55 MIL/uL (ref 4.22–5.81)
RDW: 12.4 % (ref 11.5–15.5)
WBC: 5.3 10*3/uL (ref 4.0–10.5)
nRBC: 0 % (ref 0.0–0.2)

## 2020-08-23 LAB — LIPASE, BLOOD: Lipase: 125 U/L — ABNORMAL HIGH (ref 11–51)

## 2020-08-23 LAB — MAGNESIUM: Magnesium: 1.6 mg/dL — ABNORMAL LOW (ref 1.7–2.4)

## 2020-08-23 LAB — HAPTOGLOBIN: Haptoglobin: 138 mg/dL (ref 29–370)

## 2020-08-23 IMAGING — RF DG HIP (WITH PELVIS) OPERATIVE*R*
1 series · 2 of 2 positions shown · non-contrast
Comparison: 12/20/2019

CLINICAL DATA: Right hip replacement

EXAM:
OPERATIVE RIGHT HIP WITH PELVIS; DG C-ARM 1-60 MIN

[Series 1: unknown protocol · 0.20mm/px · 2 of 2 slices shown]
[im 1/2]
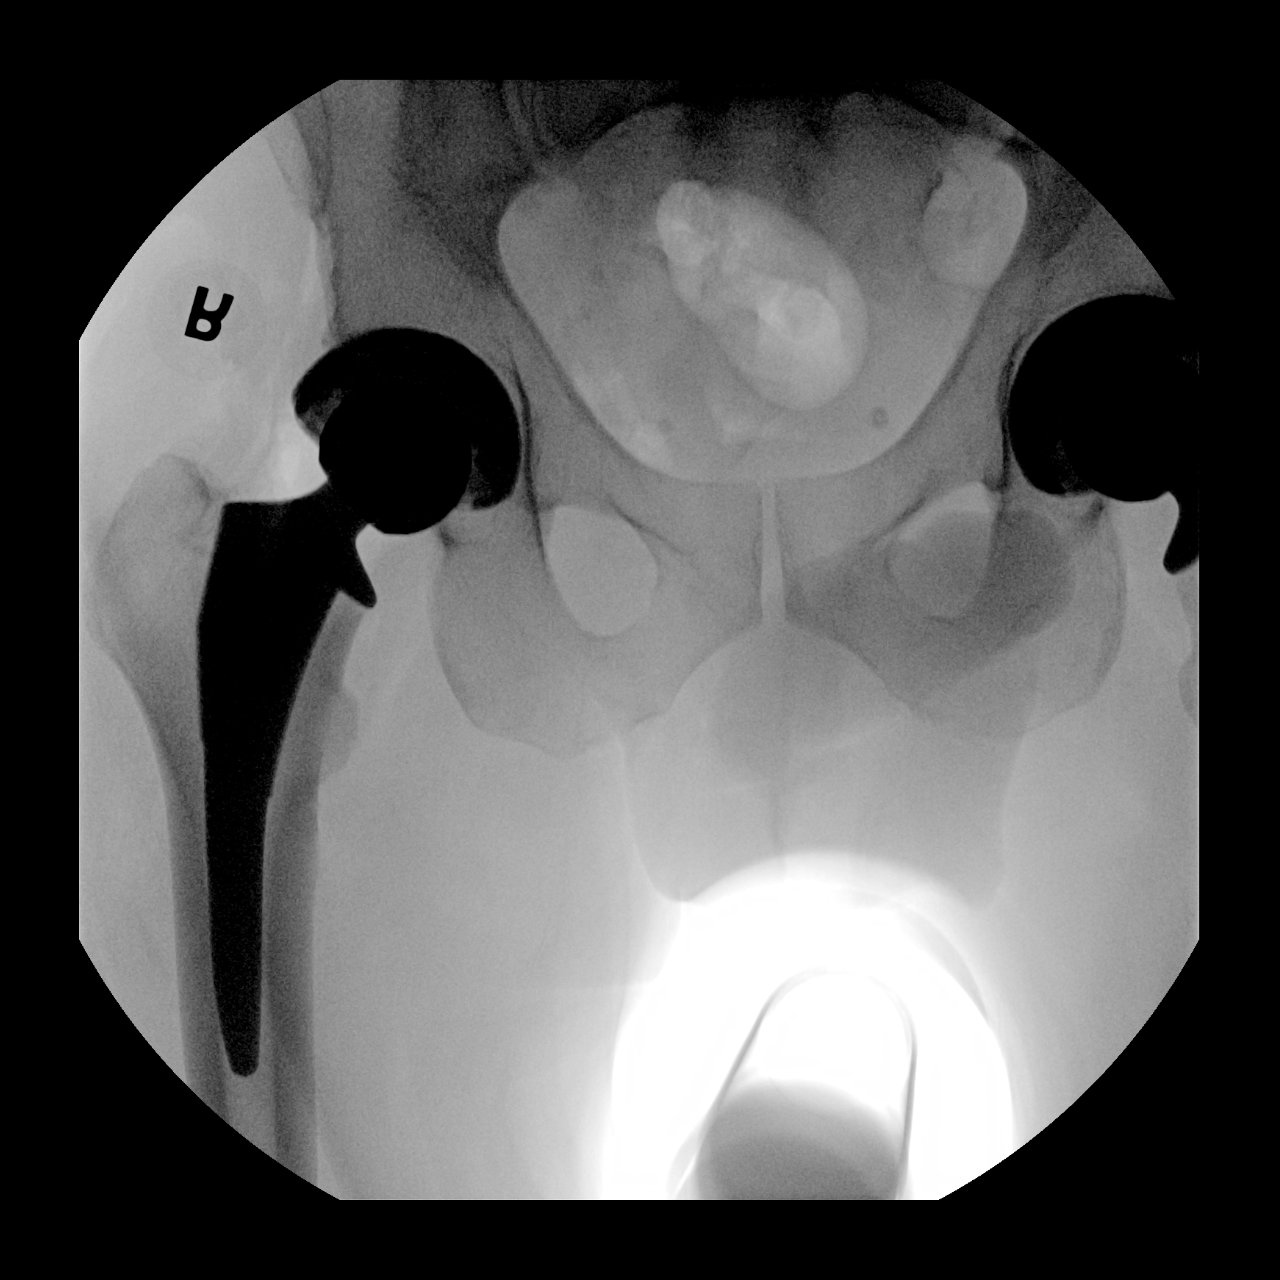
[im 2/2]
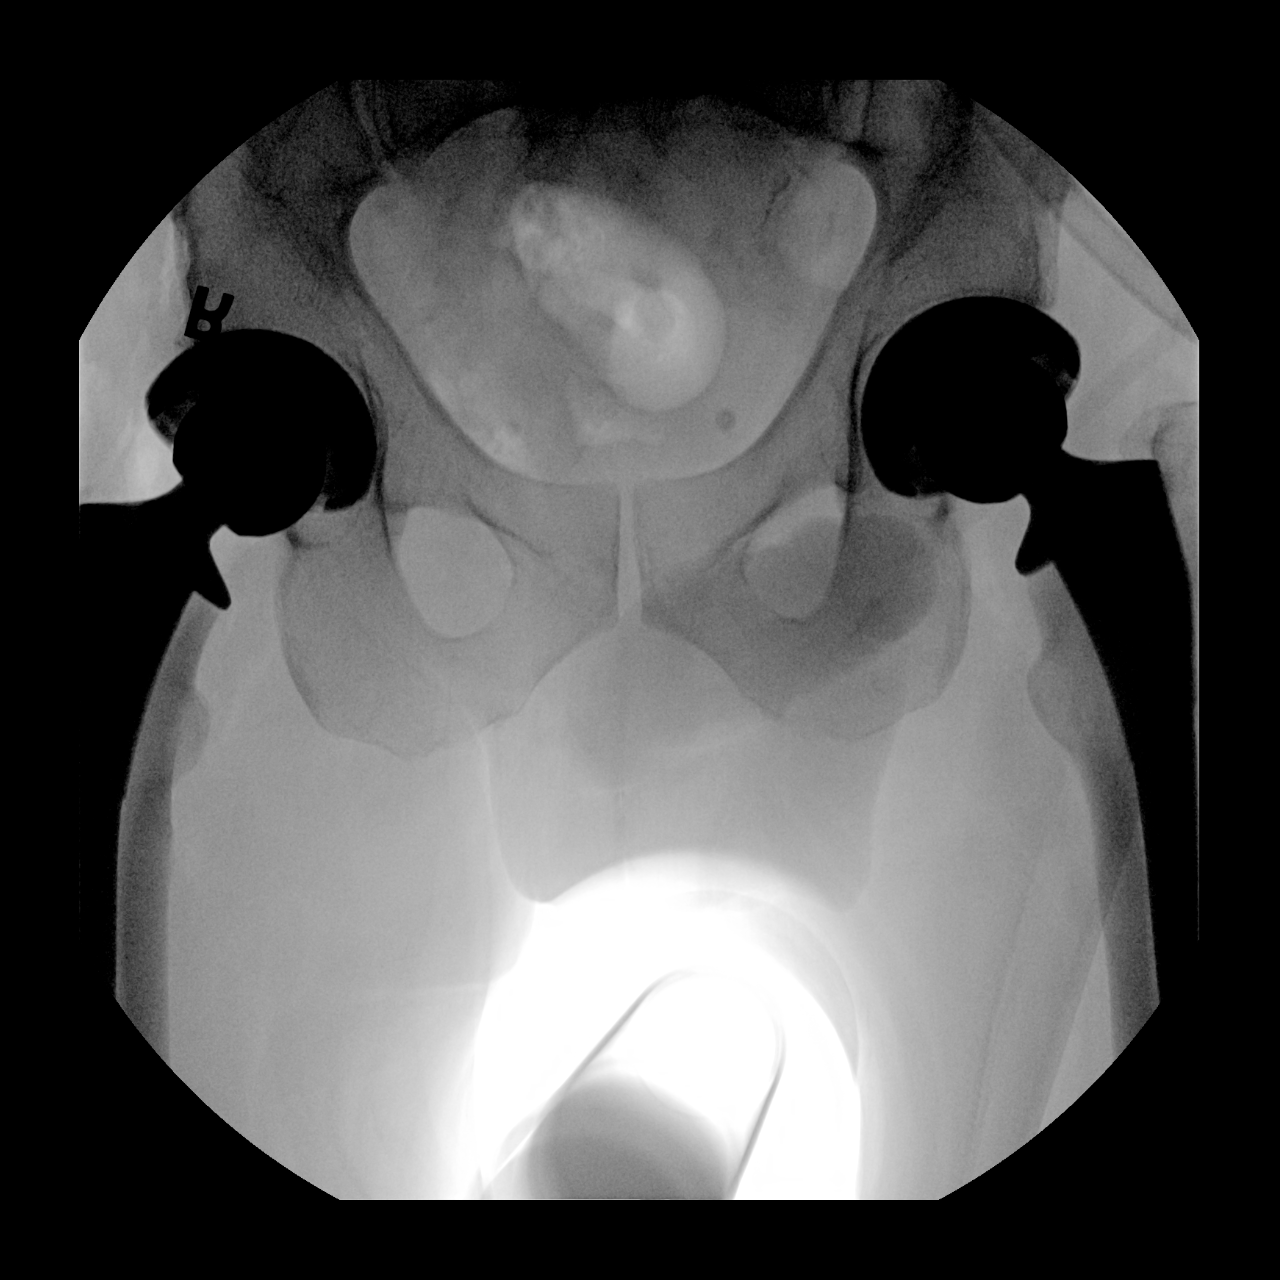

[2 of 2 positions shown; findings below may reference images not displayed]

FLUOROSCOPY TIME:  Fluoroscopy Time:  18 seconds

Radiation Exposure Index (if provided by the fluoroscopic device):
1.65 mGy

Number of Acquired Spot Images: 2
FINDINGS: Right hip replacement is now seen. Previously noted left hip
replacement is again identified. No acute bony or soft tissue
abnormality is seen.
IMPRESSION: Interval right hip replacement.

## 2020-08-23 MED ORDER — MAGNESIUM SULFATE 2 GM/50ML IV SOLN
2.0000 g | Freq: Once | INTRAVENOUS | Status: AC
  Administered 2020-08-23: 2 g via INTRAVENOUS
  Filled 2020-08-23: qty 50

## 2020-08-23 NOTE — Progress Notes (Signed)
Paged Dr. Josephine Cables about patient's complain of abdominal pain at 6/10, MD said to give dilaudid prn earlier than the due time to address patient's pain. Will continue to monitor.    Milagros Loll, RN.

## 2020-08-23 NOTE — Progress Notes (Signed)
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  In the emergency room clinical evidence of pancreatitis.  Lipase minimally elevated.  Gallbladder scan without a stone.   Assessment & Plan:   Principal Problem:   Alcoholic pancreatitis Active Problems:   Essential hypertension   Blindness of both eyes   Alcohol use   Intractable nausea and vomiting   Acute alcoholic pancreatitis  Acute uncomplicated pancreatitis: Suspect alcohol induced.  Negative for gallbladder pathology. Continue IV fluid.  N.p.o.  Not ready to eat.  Adequate pain medication with IV opiates and nausea medications.  Close monitoring of electrolytes.  Renal function, liver function test and lipase in the morning.  Alcoholism: Extensive counseling.  He is receptive to counseling.  Drinks about 3 glasses of vodka a day.  High risk of alcohol withdrawal.  Currently on CIWA scale and is stable so far.  On multivitamins.  Essential hypertension: Diet controlled.  Monitor.  No indication for treatment at this time.  Hypokalemia/hypomagnesemia: Replace aggressively and monitor levels.   DVT prophylaxis: enoxaparin (LOVENOX) injection 40 mg Start: 08/22/20 1700   Code Status: Full code Family Communication: Wife at the bedside Disposition Plan: Status is: Inpatient  Remains inpatient appropriate because:Inpatient level of care appropriate due to severity of illness   Dispo: The patient is from: Home              Anticipated d/c is to: Home              Patient currently is not medically stable to  d/c.   Difficult to place patient No         Consultants:   None  Procedures:   None  Antimicrobials:   None   Subjective: Patient seen and examined.  His wife was at the bedside.  Patient at this moment was all right.  He was very worried that the Dilaudid injection only takes away his pain for about half an hour and it hurts again.  He wanted to see if we can do Dilaudid today we have an hour.  Denies any nausea vomiting.  No bowel movement for last 5 days.  Objective: Vitals:   08/22/20 2000 08/22/20 2321 08/23/20 0419 08/23/20 0957  BP: (!) 170/91 (!) 155/92 (!) 159/97 (!) 159/91  Pulse: 68 73 97 98  Resp: 20 18 18 18   Temp: 99.1 F (37.3 C) 98.7 F (37.1 C) 98 F (36.7 C) 98.8 F (37.1 C)  TempSrc: Oral Oral Oral Oral  SpO2: 94% 98% 98% 99%  Weight:      Height:        Intake/Output Summary (Last 24 hours) at 08/23/2020 1143 Last data filed at 08/23/2020 0959 Gross per 24 hour  Intake 4728.6 ml  Output 652 ml  Net 4076.6 ml   Filed Weights   08/22/20 1345  Weight: 68.5 kg    Examination:  General exam: Appears calm and comfortable, mild distress on examination of the abdomen. 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 Respiratory system: Clear to auscultation. Respiratory effort normal.  No added sound.  Cardiovascular system: S1 & S2 heard, RRR. No pedal edema. Gastrointestinal system: Mildly distended.  Moderately tender on palpation to right upper quadrant and epigastrium.  Bowel sounds present. Central nervous system: Alert and oriented. No focal neurological deficits. Extremities: Symmetric 5 x 5 power. Skin: No rashes, lesions or ulcers Psychiatry: Judgement and insight appear normal. Mood & affect appropriate.     Data Reviewed: I have personally reviewed following labs and imaging studies  CBC: Recent Labs  Lab 08/22/20 0719 08/23/20 0303  WBC 6.3 5.3  NEUTROABS 4.3  --   HGB 15.4 14.5  HCT 44.4 40.8   MCV 91.5 89.7  PLT 161 242   Basic Metabolic Panel: Recent Labs  Lab 08/22/20 0719 08/22/20 1309 08/23/20 0303  NA 131*  --  130*  K 3.9  --  3.9  CL 93*  --  98  CO2 25  --  25  GLUCOSE 141*  --  91  BUN 5*  --  6  CREATININE 1.05  --  1.09  CALCIUM 9.4  --  8.6*  MG  --  1.7 1.6*  PHOS  --  3.2  --    GFR: Estimated Creatinine Clearance: 71.6 mL/min (by C-G formula based on SCr of 1.09 mg/dL). Liver Function Tests: Recent Labs  Lab 08/22/20 0719 08/23/20 0303  AST 25 19  ALT 14 14  ALKPHOS 91 76  BILITOT 1.0 1.2  PROT 7.5 6.3*  ALBUMIN 4.1 3.4*   Recent Labs  Lab 08/22/20 0719 08/23/20 0303  LIPASE 111* 125*   No results for input(s): AMMONIA in the last 168 hours. Coagulation Profile: No results for input(s): INR, PROTIME in the last 168 hours. Cardiac Enzymes: No results for input(s): CKTOTAL, CKMB, CKMBINDEX, TROPONINI in the last 168 hours. BNP (last 3 results) No results for input(s): PROBNP in the last 8760 hours. HbA1C: No results for input(s): HGBA1C in the last 72 hours. CBG: No results for input(s): GLUCAP in the last 168 hours. Lipid Profile: No results for input(s): CHOL, HDL, LDLCALC, TRIG, CHOLHDL, LDLDIRECT in the last 72 hours. Thyroid Function Tests: No results for input(s): TSH, T4TOTAL, FREET4, T3FREE, THYROIDAB in the last 72 hours. Anemia Panel: No results for input(s): VITAMINB12, FOLATE, FERRITIN, TIBC, IRON, RETICCTPCT in the last 72 hours. Sepsis Labs: No results for input(s): PROCALCITON, LATICACIDVEN in the last 168 hours.  Recent Results (from the past 240 hour(s))  SARS CORONAVIRUS 2 (TAT 6-24 HRS) Nasopharyngeal Nasopharyngeal Swab     Status: None   Collection Time: 08/22/20  2:55 PM   Specimen: Nasopharyngeal Swab  Result Value Ref Range Status   SARS Coronavirus 2 NEGATIVE NEGATIVE Final    Comment: (NOTE) SARS-CoV-2 target nucleic acids are NOT DETECTED.  The SARS-CoV-2 RNA is generally detectable in upper and  lower respiratory specimens during the acute phase of infection. Negative results do not preclude SARS-CoV-2 infection, do not rule out co-infections with other pathogens, and should not be used as the sole basis for treatment or other patient management decisions. Negative results must be combined with clinical observations, patient history, and epidemiological information. The expected result is Negative.  Fact Sheet for Patients: SugarRoll.be  Fact Sheet for Healthcare Providers: https://www.woods-mathews.com/  This test is not yet approved or cleared by the [place] FDA and  has been authorized for detection and/or diagnosis of SARS-CoV-2 by FDA under an Emergency Use Authorization (EUA). This EUA will remain  in effect (meaning this test can be used) for the duration  of the COVID-19 declaration under Se ction 564(b)(1) of the Act, 21 U.S.C. section 360bbb-3(b)(1), unless the authorization is terminated or revoked sooner.  Performed at [facility], [place] [address], [place], [place] [address]          Radiology Studies: CT Abdomen Pelvis W Contrast  Result Date: 08/22/2020 CLINICAL DATA:  Right upper quadrant abdominal pain for 2 days. EXAM: CT ABDOMEN AND PELVIS WITH CONTRAST TECHNIQUE: Multidetector CT imaging of the abdomen and pelvis was performed using the standard protocol following bolus administration of intravenous contrast. CONTRAST:  100 cc Omnipaque 300 COMPARISON:  10/04/2019 FINDINGS: Lower chest: Patchy dependent subpleural atelectasis. No infiltrates or effusions. No worrisome pulmonary lesions. The heart is normal in size. No pericardial effusion. Hepatobiliary: No hepatic lesions or intrahepatic biliary dilatation. The gallbladder is unremarkable. No common bile duct dilatation. Pancreas: Mild inflammation around the lower or pancreatic head/uncinate process region could suggest focal pancreatitis or it could  be related to duodenitis. No pancreatic mass or ductal dilatation. Spleen: Normal size.  No focal lesions. Adrenals/Urinary Tract: The adrenal glands and kidneys are unremarkable. The bladder is normal. Stomach/Bowel: The stomach is moderately distended with fluid and air. There is wall thickening and mild mucosal enhancement of the duodenum. Findings suggest duodenitis but this could also be secondary to pancreatitis. The small bowel and colon are grossly normal. Vascular/Lymphatic: Age advanced vascular calcifications but no aneurysm or dissection. No mesenteric or retroperitoneal adenopathy. No pelvic adenopathy. 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 Other: No free pelvic fluid collections or inguinal hernias. Stable periumbilical abdominal wall hernia containing fat. Musculoskeletal: No significant bony findings. IMPRESSION: 1. Focal pancreatitis involving the lower pancreatic head and uncinate process versus duodenitis. Recommend clinical correlation with lipase level, etc. 2. Moderate distention of the stomach likely due to the above. 3. No other acute abdominal/pelvic findings and no mass lesions or adenopathy. 4. Age advanced atherosclerotic calcifications involving the aorta and iliac arteries. Electronically Signed   By: Marijo Sanes M.D.   On: 08/22/2020 15:14   DG Chest Port 1 View  Result Date: 08/22/2020 CLINICAL DATA:  Abdominal pain with nausea and vomiting. Rule out infection. EXAM: PORTABLE CHEST 1 VIEW COMPARISON:  10/02/2019 FINDINGS: The cardiomediastinal contours are normal. The lungs are clear. Pulmonary vasculature is normal. No consolidation, pleural effusion, or pneumothorax. No acute osseous abnormalities are seen. IMPRESSION: No acute chest findings.  No evidence of infection. Electronically Signed   By: Keith Rake M.D.   On: 08/22/2020 17:15   US Abdomen Limited RUQ  (LIVER/GB)  Result Date: 08/22/2020 CLINICAL DATA:  Right upper quadrant pain EXAM: ULTRASOUND ABDOMEN LIMITED RIGHT UPPER QUADRANT COMPARISON:  None. FINDINGS: Gallbladder: No gallstones or wall thickening visualized. No sonographic Murphy sign noted by sonographer. Common bile duct: Diameter: 3 mm, normal Liver: No focal lesion identified. Within normal limits in parenchymal echogenicity. Portal vein is patent on color Doppler imaging with normal direction of blood flow towards the liver. Other: None. IMPRESSION: Normal right upper quadrant ultrasound. Electronically Signed   By: Macy Mis M.D.   On: 08/22/2020 11:05        Scheduled Meds: . enoxaparin (LOVENOX) injection  40 mg Subcutaneous Q24H  . thiamine  100 mg Intravenous Daily   Continuous Infusions: . 0.9 % NaCl with KCl 20 mEq / L 100 mL/hr at 08/22/20 1923     LOS: 1 day  Time spent: 35 minutes    Barb Merino, MD [facility] Pager [phone]

## 2020-08-24 LAB — CBC WITH DIFFERENTIAL/PLATELET
Abs Immature Granulocytes: 0 10*3/uL (ref 0.00–0.07)
Basophils Absolute: 0 10*3/uL (ref 0.0–0.1)
Basophils Relative: 1 %
Eosinophils Absolute: 0.1 10*3/uL (ref 0.0–0.5)
Eosinophils Relative: 1 %
HCT: 40 % (ref 39.0–52.0)
Hemoglobin: 14.1 g/dL (ref 13.0–17.0)
Immature Granulocytes: 0 %
Lymphocytes Relative: 41 %
Lymphs Abs: 1.7 10*3/uL (ref 0.7–4.0)
MCH: 31.9 pg (ref 26.0–34.0)
MCHC: 35.3 g/dL (ref 30.0–36.0)
MCV: 90.5 fL (ref 80.0–100.0)
Monocytes Absolute: 0.6 10*3/uL (ref 0.1–1.0)
Monocytes Relative: 13 %
Neutro Abs: 1.8 10*3/uL (ref 1.7–7.7)
Neutrophils Relative %: 44 %
Platelets: 134 10*3/uL — ABNORMAL LOW (ref 150–400)
RBC: 4.42 MIL/uL (ref 4.22–5.81)
RDW: 12.2 % (ref 11.5–15.5)
WBC: 4.2 10*3/uL (ref 4.0–10.5)
nRBC: 0 % (ref 0.0–0.2)

## 2020-08-24 LAB — COMPREHENSIVE METABOLIC PANEL
ALT: 14 U/L (ref 0–44)
AST: 25 U/L (ref 15–41)
Albumin: 3.3 g/dL — ABNORMAL LOW (ref 3.5–5.0)
Alkaline Phosphatase: 70 U/L (ref 38–126)
Anion gap: 9 (ref 5–15)
BUN: 8 mg/dL (ref 6–20)
CO2: 26 mmol/L (ref 22–32)
Calcium: 8.6 mg/dL — ABNORMAL LOW (ref 8.9–10.3)
Chloride: 96 mmol/L — ABNORMAL LOW (ref 98–111)
Creatinine, Ser: 1.07 mg/dL (ref 0.61–1.24)
GFR, Estimated: >60 mL/min (ref >60)
Glucose, Bld: 74 mg/dL (ref 70–99)
Potassium: 4.3 mmol/L (ref 3.5–5.1)
Sodium: 131 mmol/L — ABNORMAL LOW (ref 135–145)
Total Bilirubin: 1.2 mg/dL (ref 0.3–1.2)
Total Protein: 6.2 g/dL — ABNORMAL LOW (ref 6.5–8.1)

## 2020-08-24 LAB — PHOSPHORUS: Phosphorus: 3.2 mg/dL (ref 2.5–4.6)

## 2020-08-24 LAB — LIPASE, BLOOD: Lipase: 45 U/L (ref 11–51)

## 2020-08-24 LAB — MAGNESIUM: Magnesium: 1.8 mg/dL (ref 1.7–2.4)

## 2020-08-24 LAB — AMMONIA: Ammonia: 15 umol/L (ref 9–35)

## 2020-08-24 MED ORDER — HYDROCODONE-ACETAMINOPHEN 5-325 MG PO TABS
1.0000 | ORAL_TABLET | Freq: Four times a day (QID) | ORAL | Status: DC | PRN
  Administered 2020-08-24 – 2020-08-25 (×3): 1 via ORAL
  Filled 2020-08-24 (×3): qty 1

## 2020-08-24 NOTE — Progress Notes (Addendum)
Pt is tolerating clear liquid diet well with no nausea, no vomiting and no pain.   Montravious Weigelt, RN

## 2020-08-24 NOTE — Progress Notes (Signed)
Day shift Charge Nurse Gwenlyn Perking, communicated with [facility] on duty about patient's relative spending the night with the patient at the bedside. AC on duty granted family member the permission to stay overnight.   Milagros Loll, RN.

## 2020-08-24 NOTE — Progress Notes (Signed)
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  In the emergency room clinical evidence of pancreatitis.  Lipase minimally elevated.  Gallbladder scan without a stone.   Assessment & Plan:   Principal Problem:   Alcoholic pancreatitis Active Problems:   Essential hypertension   Blindness of both eyes   Alcohol use   Intractable nausea and vomiting   Acute alcoholic pancreatitis  Acute uncomplicated pancreatitis: Suspect alcohol induced.  Negative for gallbladder pathology. Continue IV fluid.  Pain slightly controlled.  Wants to try oral pain medications today.  We will start clears and monitor today. Renal function, liver function test and lipase in the morning.  Alcoholism: Extensive counseling.  He is receptive to counseling.  Drinks about 3 glasses of vodka a day.  High risk of alcohol withdrawal.  Currently on CIWA scale and is stable so far.  On multivitamins. So far stable.  Essential hypertension: Diet controlled.  Monitor.  No indication for treatment at this time.  Hypokalemia/hypomagnesemia: Replaced.   DVT prophylaxis: enoxaparin (LOVENOX) injection 40 mg Start: 08/22/20 1700   Code Status: Full code Family Communication: Wife at the bedside Disposition Plan: Status is: Inpatient  Remains inpatient appropriate because:Inpatient level of care appropriate due to severity of illness   Dispo: The patient is from: Home              Anticipated d/c is to: Home              Patient currently is not medically stable to d/c.   Difficult to place patient  No         Consultants:   None  Procedures:   None  Antimicrobials:   None   Subjective: Patient seen and examined.  No overnight events.  Did have episodic pain.  He wants to try something long-acting pain medicine.  He wants to eat and go home. Objective: Vitals:   08/23/20 1846 08/23/20 2034 08/24/20 0457 08/24/20 0857  BP: (!) 174/97 (!) 157/95 (!) 141/94 (!) 172/97  Pulse: 62 60 60 64  Resp: 18 16 18 18   Temp: 98.9 F (37.2 C) 98.7 F (37.1 C) 98.1 F (36.7 C) 99.1 F (37.3 C)  TempSrc: Oral Oral Oral Oral  SpO2: 98% 97% 97% 98%  Weight:      Height:        Intake/Output Summary (Last 24 hours) at 08/24/2020 1120 Last data filed at 08/24/2020 1102 Gross per 24 hour  Intake 4133.04 ml  Output 2650 ml  Net 1483.04 ml   Filed Weights   08/22/20 1345  Weight: 68.5 kg    Examination:  General exam: Appears calm and comfortable, mild distress on examination of the abdomen. 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 Respiratory system: Clear to auscultation. Respiratory effort normal.  No added sound. Cardiovascular system: S1 & S2 heard, RRR. No pedal edema. Gastrointestinal system: Mildly distended.  Palpable tenderness right upper quadrant and epigastrium.  Bowel sounds present. Central nervous system: Alert and oriented. No focal neurological deficits. Extremities: Symmetric 5 x 5 power. Skin: No rashes,  lesions or ulcers Psychiatry: Judgement and insight appear normal. Mood & affect appropriate.     Data Reviewed: I have personally reviewed following labs and imaging studies  CBC: Recent Labs  Lab 08/22/20 0719 08/23/20 0303 08/24/20 0309  WBC 6.3 5.3 4.2  NEUTROABS 4.3  --  1.8  HGB 15.4 14.5 14.1  HCT 44.4 40.8 40.0  MCV 91.5 89.7 90.5  PLT 161 150 646*   Basic Metabolic Panel: Recent Labs  Lab 08/22/20 0719 08/22/20 1309 08/23/20 0303 08/24/20 0309  NA 131*  --  130* 131*  K 3.9  --  3.9 4.3  CL 93*   --  98 96*  CO2 25  --  25 26  GLUCOSE 141*  --  91 74  BUN 5*  --  6 8  CREATININE 1.05  --  1.09 1.07  CALCIUM 9.4  --  8.6* 8.6*  MG  --  1.7 1.6* 1.8  PHOS  --  3.2  --  3.2   GFR: Estimated Creatinine Clearance: 72.9 mL/min (by C-G formula based on SCr of 1.07 mg/dL). Liver Function Tests: Recent Labs  Lab 08/22/20 0719 08/23/20 0303 08/24/20 0309  AST 25 19 25   ALT 14 14 14   ALKPHOS 91 76 70  BILITOT 1.0 1.2 1.2  PROT 7.5 6.3* 6.2*  ALBUMIN 4.1 3.4* 3.3*   Recent Labs  Lab 08/22/20 0719 08/23/20 0303 08/24/20 0309  LIPASE 111* 125* 45   Recent Labs  Lab 08/24/20 0309  AMMONIA 15   Coagulation Profile: No results for input(s): INR, PROTIME in the last 168 hours. Cardiac Enzymes: No results for input(s): CKTOTAL, CKMB, CKMBINDEX, TROPONINI in the last 168 hours. BNP (last 3 results) No results for input(s): PROBNP in the last 8760 hours. HbA1C: No results for input(s): HGBA1C in the last 72 hours. CBG: No results for input(s): GLUCAP in the last 168 hours. Lipid Profile: No results for input(s): CHOL, HDL, LDLCALC, TRIG, CHOLHDL, LDLDIRECT in the last 72 hours. Thyroid Function Tests: No results for input(s): TSH, T4TOTAL, FREET4, T3FREE, THYROIDAB in the last 72 hours. Anemia Panel: No results for input(s): VITAMINB12, FOLATE, FERRITIN, TIBC, IRON, RETICCTPCT in the last 72 hours. Sepsis Labs: No results for input(s): PROCALCITON, LATICACIDVEN in the last 168 hours.  Recent Results (from the past 240 hour(s))  SARS CORONAVIRUS 2 (TAT 6-24 HRS) Nasopharyngeal Nasopharyngeal Swab     Status: None   Collection Time: 08/22/20  2:55 PM   Specimen: Nasopharyngeal Swab  Result Value Ref Range Status   SARS Coronavirus 2 NEGATIVE NEGATIVE Final    Comment: (NOTE) SARS-CoV-2 target nucleic acids are NOT DETECTED.  The SARS-CoV-2 RNA is generally detectable in upper and lower respiratory specimens during the acute phase of infection. Negative results do  not preclude SARS-CoV-2 infection, do not rule out co-infections with other pathogens, and should not be used as the sole basis for treatment or other patient management decisions. Negative results must be combined with clinical observations, patient history, and epidemiological information. The expected result is Negative.  Fact Sheet for Patients: SugarRoll.be  Fact Sheet for Healthcare Providers: https://www.woods-mathews.com/  This test is not yet approved or cleared by the [place] FDA and  has been authorized for detection and/or diagnosis of SARS-CoV-2 by FDA under an Emergency Use Authorization (EUA). This EUA will remain  in effect (meaning this test can be used) for the duration of the COVID-19 declaration under Se ction 564(b)(1) of the Act, 21 U.S.C. section 360bbb-3(b)(1), unless  the authorization is terminated or revoked sooner.  Performed at [facility], [place] [address], [place], [place] [address]          Radiology Studies: CT Abdomen Pelvis W Contrast  Result Date: 08/22/2020 CLINICAL DATA:  Right upper quadrant abdominal pain for 2 days. EXAM: CT ABDOMEN AND PELVIS WITH CONTRAST TECHNIQUE: Multidetector CT imaging of the abdomen and pelvis was performed using the standard protocol following bolus administration of intravenous contrast. CONTRAST:  100 cc Omnipaque 300 COMPARISON:  10/04/2019 FINDINGS: Lower chest: Patchy dependent subpleural atelectasis. No infiltrates or effusions. No worrisome pulmonary lesions. The heart is normal in size. No pericardial effusion. Hepatobiliary: No hepatic lesions or intrahepatic biliary dilatation. The gallbladder is unremarkable. No common bile duct dilatation. Pancreas: Mild inflammation around the lower or pancreatic head/uncinate process region could suggest focal pancreatitis or it could be related to duodenitis. No pancreatic mass or ductal dilatation. Spleen: Normal size.   No focal lesions. Adrenals/Urinary Tract: The adrenal glands and kidneys are unremarkable. The bladder is normal. Stomach/Bowel: The stomach is moderately distended with fluid and air. There is wall thickening and mild mucosal enhancement of the duodenum. Findings suggest duodenitis but this could also be secondary to pancreatitis. The small bowel and colon are grossly normal. Vascular/Lymphatic: Age advanced vascular calcifications but no aneurysm or dissection. No mesenteric or retroperitoneal adenopathy. No pelvic adenopathy. 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 Other: No free pelvic fluid collections or inguinal hernias. Stable periumbilical abdominal wall hernia containing fat. Musculoskeletal: No significant bony findings. IMPRESSION: 1. Focal pancreatitis involving the lower pancreatic head and uncinate process versus duodenitis. Recommend clinical correlation with lipase level, etc. 2. Moderate distention of the stomach likely due to the above. 3. No other acute abdominal/pelvic findings and no mass lesions or adenopathy. 4. Age advanced atherosclerotic calcifications involving the aorta and iliac arteries. Electronically Signed   By: Marijo Sanes M.D.   On: 08/22/2020 15:14   DG Chest Port 1 View  Result Date: 08/22/2020 CLINICAL DATA:  Abdominal pain with nausea and vomiting. Rule out infection. EXAM: PORTABLE CHEST 1 VIEW COMPARISON:  10/02/2019 FINDINGS: The cardiomediastinal contours are normal. The lungs are clear. Pulmonary vasculature is normal. No consolidation, pleural effusion, or pneumothorax. No acute osseous abnormalities are seen. IMPRESSION: No acute chest findings.  No evidence of infection. Electronically Signed   By: Keith Rake M.D.   On: 08/22/2020 17:15        Scheduled Meds:  enoxaparin (LOVENOX) injection  40 mg Subcutaneous Q24H   thiamine  100 mg  Intravenous Daily   Continuous Infusions:  0.9 % NaCl with KCl 20 mEq / L 100 mL/hr at 08/24/20 1102     LOS: 2 days    Time spent: 32 minutes    Barb Merino, MD [facility] Pager [phone]

## 2020-08-24 NOTE — Plan of Care (Signed)
  Problem: Education: Goal: Knowledge of Pancreatitis treatment and prevention will improve Outcome: Progressing   

## 2020-08-25 LAB — CBC WITH DIFFERENTIAL/PLATELET
Abs Immature Granulocytes: 0 10*3/uL (ref 0.00–0.07)
Basophils Absolute: 0 10*3/uL (ref 0.0–0.1)
Basophils Relative: 1 %
Eosinophils Absolute: 0 10*3/uL (ref 0.0–0.5)
Eosinophils Relative: 1 %
HCT: 43.9 % (ref 39.0–52.0)
Hemoglobin: 15.4 g/dL (ref 13.0–17.0)
Immature Granulocytes: 0 %
Lymphocytes Relative: 41 %
Lymphs Abs: 1.6 10*3/uL (ref 0.7–4.0)
MCH: 31.5 pg (ref 26.0–34.0)
MCHC: 35.1 g/dL (ref 30.0–36.0)
MCV: 89.8 fL (ref 80.0–100.0)
Monocytes Absolute: 0.6 10*3/uL (ref 0.1–1.0)
Monocytes Relative: 15 %
Neutro Abs: 1.6 10*3/uL — ABNORMAL LOW (ref 1.7–7.7)
Neutrophils Relative %: 42 %
Platelets: 145 10*3/uL — ABNORMAL LOW (ref 150–400)
RBC: 4.89 MIL/uL (ref 4.22–5.81)
RDW: 12 % (ref 11.5–15.5)
WBC: 3.8 10*3/uL — ABNORMAL LOW (ref 4.0–10.5)
nRBC: 0 % (ref 0.0–0.2)

## 2020-08-25 LAB — COMPREHENSIVE METABOLIC PANEL
ALT: 18 U/L (ref 0–44)
AST: 56 U/L — ABNORMAL HIGH (ref 15–41)
Albumin: 3.9 g/dL (ref 3.5–5.0)
Alkaline Phosphatase: 80 U/L (ref 38–126)
Anion gap: 9 (ref 5–15)
BUN: 6 mg/dL (ref 6–20)
CO2: 27 mmol/L (ref 22–32)
Calcium: 9.3 mg/dL (ref 8.9–10.3)
Chloride: 96 mmol/L — ABNORMAL LOW (ref 98–111)
Creatinine, Ser: 1.07 mg/dL (ref 0.61–1.24)
GFR, Estimated: >60 mL/min (ref >60)
Glucose, Bld: 114 mg/dL — ABNORMAL HIGH (ref 70–99)
Potassium: 4.5 mmol/L (ref 3.5–5.1)
Sodium: 132 mmol/L — ABNORMAL LOW (ref 135–145)
Total Bilirubin: 1 mg/dL (ref 0.3–1.2)
Total Protein: 7.4 g/dL (ref 6.5–8.1)

## 2020-08-25 LAB — LIPASE, BLOOD: Lipase: 55 U/L — ABNORMAL HIGH (ref 11–51)

## 2020-08-25 LAB — AMMONIA: Ammonia: 22 umol/L (ref 9–35)

## 2020-08-25 MED ORDER — HYDROCODONE-ACETAMINOPHEN 5-325 MG PO TABS: 1.0000 | ORAL_TABLET | Freq: Four times a day (QID) | ORAL | 0 refills | Status: AC | PRN

## 2020-08-25 NOTE — Discharge Summary (Signed)
Physician Discharge Summary  Austin [name] IRS:[id number] DOB: 18-Jun-1965 DOA: 08/22/2020  PCP: Buzzy Han, MD  Admit date: 08/22/2020 Discharge date: 08/25/2020  Admitted From: Home Disposition: Home  Recommendations for Outpatient Follow-up:  1. Follow up with PCP in 1-2 weeks   Home Health: Not applicable Equipment/Devices: Not applicable  Discharge Condition: Stable CODE STATUS: Full code Diet recommendation: Regular diet, liquid diet and soft food.  Discharge summary: 55 year old gentleman with history of congenital blindness, ongoing use of alcohol who drinks about 3 glasses of vodka a day, history of alcoholic pancreatitis about 1 year ago presented to the hospital with 2 days of anorexia, epigastric and right upper quadrant abdominal pain.  He was found to have acute pancreatitis with no complications.  Lipase minimally elevated.  Gallbladder scan without a stone. Admitted to the hospital and treated aggressively with IV fluids, pain medications, initially with no oral intake.  Gradually improved.  Now tolerating full liquid diet and having normal bowel function.  Pain is managed with oral pain medications.  Patient has a stabilized.  He still has some mild to moderate pain.  Lipase normalized.  Tolerating full liquid diet.  He wants to go home.  Will discharge home with short course of oral pain medications.  Extensive discussion with patient and his wife at the bedside that he should stop drinking alcohol altogether and he is very receptive and agreeable.  He will be going home.  He will use Tylenol and ibuprofen for mild pain and Norco 5/325 for moderate pain.   Discharge Diagnoses:  Principal Problem:   Alcoholic pancreatitis Active Problems:   Essential hypertension   Blindness of both eyes   Alcohol use   Intractable nausea and vomiting   Acute alcoholic pancreatitis    Discharge Instructions  Discharge Instructions    Call MD for:  difficulty  breathing, headache or visual disturbances   Complete by: As directed    Call MD for:  persistant nausea and vomiting   Complete by: As directed    Call MD for:  severe uncontrolled pain   Complete by: As directed    Diet general   Complete by: As directed    Stay on liquid diet until you have abdominal pain.   Discharge instructions   Complete by: As directed    Stay on liquid and soft diet until your belly pain is all improved. Quit alcohol altogether.   Increase activity slowly   Complete by: As directed      Allergies as of 08/25/2020      Reactions   Chlorhexidine Rash   Foley D/Ced this AM 01/08/2020      Medication List    STOP taking these medications   VITAMIN E PO     TAKE these medications   acetaminophen 500 MG tablet Commonly known as: TYLENOL Take 1,000 mg by mouth every 6 (six) hours as needed for headache (pain).   HYDROcodone-acetaminophen 5-325 MG tablet Commonly known as: NORCO/VICODIN Take 1 tablet by mouth every 6 (six) hours as needed for up to 5 days for moderate pain.   ibuprofen 200 MG tablet Commonly known as: ADVIL Take 400-800 mg by mouth every 6 (six) hours as needed for headache (pain).   loperamide 2 MG tablet Commonly known as: IMODIUM A-D Take 2 mg by mouth See admin instructions. Take one tablet (2 mg) by mouth every morning, may take one tablet (2 mg) in the afternoon as needed for diarrhea   ondansetron 4 MG  tablet Commonly known as: ZOFRAN Take 4 mg by mouth daily as needed for nausea or vomiting.   tadalafil 10 MG tablet Commonly known as: CIALIS Take 10 mg by mouth daily as needed for erectile dysfunction.   VITAMIN B-12 PO Take 1 tablet by mouth daily.   VITAMIN C PO Take 1 tablet by mouth daily.   VITAMIN D3 PO Take 1 tablet by mouth daily.   Vitamin-B Complex Tabs Take 1 tablet by mouth daily.       Follow-up Information    Buzzy Han, MD Follow up in 2 week(s).   Specialty: Family  Medicine Contact information: [place] [address] [phone]              Allergies  Allergen Reactions  . Chlorhexidine Rash    Foley D/Ced this AM 01/08/2020    Consultations:  None   Procedures/Studies: CT Abdomen Pelvis W Contrast  Result Date: 08/22/2020 CLINICAL DATA:  Right upper quadrant abdominal pain for 2 days. EXAM: CT ABDOMEN AND PELVIS WITH CONTRAST TECHNIQUE: Multidetector CT imaging of the abdomen and pelvis was performed using the standard protocol following bolus administration of intravenous contrast. CONTRAST:  100 cc Omnipaque 300 COMPARISON:  10/04/2019 FINDINGS: Lower chest: Patchy dependent subpleural atelectasis. No infiltrates or effusions. No worrisome pulmonary lesions. The heart is normal in size. No pericardial effusion. Hepatobiliary: No hepatic lesions or intrahepatic biliary dilatation. The gallbladder is unremarkable. No common bile duct dilatation. Pancreas: Mild inflammation around the lower or pancreatic head/uncinate process region could suggest focal pancreatitis or it could be related to duodenitis. No pancreatic mass or ductal dilatation. Spleen: Normal size.  No focal lesions. Adrenals/Urinary Tract: The adrenal glands and kidneys are unremarkable. The bladder is normal. Stomach/Bowel: The stomach is moderately distended with fluid and air. There is wall thickening and mild mucosal enhancement of the duodenum. Findings suggest duodenitis but this could also be secondary to pancreatitis. The small bowel and colon are grossly normal. Vascular/Lymphatic: Age advanced vascular calcifications but no aneurysm or dissection. No mesenteric or retroperitoneal adenopathy. No pelvic adenopathy. 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 Other: No free pelvic fluid collections or inguinal hernias. Stable periumbilical abdominal wall  hernia containing fat. Musculoskeletal: No significant bony findings. IMPRESSION: 1. Focal pancreatitis involving the lower pancreatic head and uncinate process versus duodenitis. Recommend clinical correlation with lipase level, etc. 2. Moderate distention of the stomach likely due to the above. 3. No other acute abdominal/pelvic findings and no mass lesions or adenopathy. 4. Age advanced atherosclerotic calcifications involving the aorta and iliac arteries. Electronically Signed   By: Marijo Sanes M.D.   On: 08/22/2020 15:14   DG Chest Port 1 View  Result Date: 08/22/2020 CLINICAL DATA:  Abdominal pain with nausea and vomiting. Rule out infection. EXAM: PORTABLE CHEST 1 VIEW COMPARISON:  10/02/2019 FINDINGS: The cardiomediastinal contours are normal. The lungs are clear. Pulmonary vasculature is normal. No consolidation, pleural effusion, or pneumothorax. No acute osseous abnormalities are seen. IMPRESSION: No acute chest findings.  No evidence of infection. Electronically Signed   By: Keith Rake M.D.   On: 08/22/2020 17:15   US Abdomen Limited RUQ (LIVER/GB)  Result Date: 08/22/2020 CLINICAL DATA:  Right upper quadrant pain EXAM: ULTRASOUND ABDOMEN LIMITED RIGHT UPPER QUADRANT COMPARISON:  None. FINDINGS: Gallbladder: No gallstones or wall thickening visualized. No sonographic Murphy sign noted by sonographer. Common bile duct: Diameter: 3 mm, normal  Liver: No focal lesion identified. Within normal limits in parenchymal echogenicity. Portal vein is patent on color Doppler imaging with normal direction of blood flow towards the liver. Other: None. IMPRESSION: Normal right upper quadrant ultrasound. Electronically Signed   By: Macy Mis M.D.   On: 08/22/2020 11:05    (Echo, Carotid, EGD, Colonoscopy, ERCP)    Subjective: Patient seen and examined.  Overnight he used 1 dose of Dilaudid.  Early morning rounds, he wanted to go home, he started using oral pain medicine, took full liquid diet  at lunch.  He feels much better and wants to go home and manage his symptoms at home.   Discharge Exam: Vitals:   08/25/20 0445 08/25/20 0941  BP: (!) 143/91 (!) 153/91  Pulse: (!) 56 (!) 55  Resp: 18 18  Temp: 98.1 F (36.7 C) 98.4 F (36.9 C)  SpO2: 96% 98%   Vitals:   08/24/20 1800 08/24/20 2004 08/25/20 0445 08/25/20 0941  BP: (!) 182/96 (!) 161/99 (!) 143/91 (!) 153/91  Pulse: 61 60 (!) 56 (!) 55  Resp:  18 18 18   Temp:  98.4 F (36.9 C) 98.1 F (36.7 C) 98.4 F (36.9 C)  TempSrc:  Oral Oral   SpO2:  100% 96% 98%  Weight:      Height:        General: Pt is alert, awake, not in acute distress Cardiovascular: RRR, S1/S2 +, no rubs, no gallops Respiratory: CTA bilaterally, no wheezing, no rhonchi Abdominal: Soft, bowel sounds present.  Mildly tender right upper quadrant with no rigidity or guarding. Extremities: no edema, no cyanosis    The results of significant diagnostics from this hospitalization (including imaging, microbiology, ancillary and laboratory) are listed below for reference.     Microbiology: Recent Results (from the past 240 hour(s))  SARS CORONAVIRUS 2 (TAT 6-24 HRS) Nasopharyngeal Nasopharyngeal Swab     Status: None   Collection Time: 08/22/20  2:55 PM   Specimen: Nasopharyngeal Swab  Result Value Ref Range Status   SARS Coronavirus 2 NEGATIVE NEGATIVE Final    Comment: (NOTE) SARS-CoV-2 target nucleic acids are NOT DETECTED.  The SARS-CoV-2 RNA is generally detectable in upper and lower respiratory specimens during the acute phase of infection. Negative results do not preclude SARS-CoV-2 infection, do not rule out co-infections with other pathogens, and should not be used as the sole basis for treatment or other patient management decisions. Negative results must be combined with clinical observations, patient history, and epidemiological information. The expected result is Negative.  Fact Sheet for  Patients: SugarRoll.be  Fact Sheet for Healthcare Providers: https://www.woods-mathews.com/  This test is not yet approved or cleared by the [place] FDA and  has been authorized for detection and/or diagnosis of SARS-CoV-2 by FDA under an Emergency Use Authorization (EUA). This EUA will remain  in effect (meaning this test can be used) for the duration of the COVID-19 declaration under Se ction 564(b)(1) of the Act, 21 U.S.C. section 360bbb-3(b)(1), unless the authorization is terminated or revoked sooner.  Performed at [facility], [place] [address], [place], [place] [address]      Labs: BNP (last 3 results) No results for input(s): BNP in the last 8760 hours. Basic Metabolic Panel: Recent Labs  Lab 08/22/20 0719 08/22/20 1309 08/23/20 0303 08/24/20 0309 08/25/20 1115  NA 131*  --  130* 131* 132*  K 3.9  --  3.9 4.3 4.5  CL 93*  --  98 96* 96*  CO2  25  --  25 26 27   GLUCOSE 141*  --  91 74 114*  BUN 5*  --  6 8 6   CREATININE 1.05  --  1.09 1.07 1.07  CALCIUM 9.4  --  8.6* 8.6* 9.3  MG  --  1.7 1.6* 1.8  --   PHOS  --  3.2  --  3.2  --    Liver Function Tests: Recent Labs  Lab 08/22/20 0719 08/23/20 0303 08/24/20 0309 08/25/20 1115  AST 25 19 25  56*  ALT 14 14 14 18   ALKPHOS 91 76 70 80  BILITOT 1.0 1.2 1.2 1.0  PROT 7.5 6.3* 6.2* 7.4  ALBUMIN 4.1 3.4* 3.3* 3.9   Recent Labs  Lab 08/22/20 0719 08/23/20 0303 08/24/20 0309 08/25/20 1115  LIPASE 111* 125* 45 55*   Recent Labs  Lab 08/24/20 0309 08/25/20 1115  AMMONIA 15 22   CBC: Recent Labs  Lab 08/22/20 0719 08/23/20 0303 08/24/20 0309 08/25/20 1115  WBC 6.3 5.3 4.2 3.8*  NEUTROABS 4.3  --  1.8 1.6*  HGB 15.4 14.5 14.1 15.4  HCT 44.4 40.8 40.0 43.9  MCV 91.5 89.7 90.5 89.8  PLT 161 150 134* 145*   Cardiac Enzymes: No results for input(s): CKTOTAL, CKMB, CKMBINDEX, TROPONINI in the last 168 hours. BNP: Invalid input(s):  POCBNP CBG: No results for input(s): GLUCAP in the last 168 hours. D-Dimer No results for input(s): DDIMER in the last 72 hours. Hgb A1c No results for input(s): HGBA1C in the last 72 hours. Lipid Profile No results for input(s): CHOL, HDL, LDLCALC, TRIG, CHOLHDL, LDLDIRECT in the last 72 hours. Thyroid function studies No results for input(s): TSH, T4TOTAL, T3FREE, THYROIDAB in the last 72 hours.  Invalid input(s): FREET3 Anemia work up No results for input(s): VITAMINB12, FOLATE, FERRITIN, TIBC, IRON, RETICCTPCT in the last 72 hours. Urinalysis    Component Value Date/Time   COLORURINE YELLOW 08/22/2020 1132   APPEARANCEUR CLEAR 08/22/2020 1132   LABSPEC >1.030 (H) 08/22/2020 1132   PHURINE 6.5 08/22/2020 1132   GLUCOSEU NEGATIVE 08/22/2020 1132   HGBUR LARGE (A) 08/22/2020 1132   BILIRUBINUR SMALL (A) 08/22/2020 1132   KETONESUR >80 (A) 08/22/2020 1132   PROTEINUR 100 (A) 08/22/2020 1132   UROBILINOGEN 2.0 (H) 09/13/2016 1039   NITRITE NEGATIVE 08/22/2020 1132   LEUKOCYTESUR NEGATIVE 08/22/2020 1132   Sepsis Labs Invalid input(s): PROCALCITONIN,  WBC,  LACTICIDVEN Microbiology Recent Results (from the past 240 hour(s))  SARS CORONAVIRUS 2 (TAT 6-24 HRS) Nasopharyngeal Nasopharyngeal Swab     Status: None   Collection Time: 08/22/20  2:55 PM   Specimen: Nasopharyngeal Swab  Result Value Ref Range Status   SARS Coronavirus 2 NEGATIVE NEGATIVE Final    Comment: (NOTE) SARS-CoV-2 target nucleic acids are NOT DETECTED.  The SARS-CoV-2 RNA is generally detectable in upper and lower respiratory specimens during the acute phase of infection. Negative results do not preclude SARS-CoV-2 infection, do not rule out co-infections with other pathogens, and should not be used as the sole basis for treatment or other patient management decisions. Negative results must be combined with clinical observations, patient history, and epidemiological information. The expected result is  Negative.  Fact Sheet for Patients: SugarRoll.be  Fact Sheet for Healthcare Providers: https://www.woods-mathews.com/  This test is not yet approved or cleared by the [place] FDA and  has been authorized for detection and/or diagnosis of SARS-CoV-2 by FDA under an Emergency Use Authorization (EUA). This EUA will remain  in effect (meaning  this test can be used) for the duration of the COVID-19 declaration under Se ction 564(b)(1) of the Act, 21 U.S.C. section 360bbb-3(b)(1), unless the authorization is terminated or revoked sooner.  Performed at [facility], [place] [address] [place], [place] [address]      Time coordinating discharge:  32 minutes  SIGNED:   Barb Merino, MD  [facility] 08/25/2020, 3:06 PM

## 2020-08-25 NOTE — Progress Notes (Signed)
DISCHARGE NOTE HOME  Austin [name] to be discharged Home per MD order. Discussed prescriptions and follow up appointments with the patient. Prescriptions given to patient; medication list explained in detail. Patient verbalized understanding.  Skin clean, dry and intact without evidence of skin break down, no evidence of skin tears noted. IV catheter discontinued intact. Site without signs and symptoms of complications. Dressing and pressure applied. Pt denies pain at the site currently. No complaints noted.  Patient free of lines, drains, and wounds.   An After Visit Summary (AVS) was printed and given to the patient. Patient escorted via wheelchair, and discharged home via private auto.  Beatris Ship, RN

## 2021-02-16 ENCOUNTER — Other Ambulatory Visit: Payer: Self-pay

## 2021-02-16 ENCOUNTER — Encounter (HOSPITAL_COMMUNITY): Payer: Self-pay

## 2021-02-16 ENCOUNTER — Emergency Department (HOSPITAL_COMMUNITY)
Admission: EM | Admit: 2021-02-16 | Discharge: 2021-02-17 | Disposition: A | Discharge status: Home / Self Care | Payer: Medicare HMO | Attending: Emergency Medicine | Admitting: Emergency Medicine

## 2021-02-16 ENCOUNTER — Emergency Department (HOSPITAL_COMMUNITY): Payer: Medicare HMO

## 2021-02-16 DIAGNOSIS — K5900 Constipation, unspecified: Secondary | ICD-10-CM | POA: Insufficient documentation

## 2021-02-16 DIAGNOSIS — R1011 Right upper quadrant pain: Secondary | ICD-10-CM | POA: Insufficient documentation

## 2021-02-16 DIAGNOSIS — Z5321 Procedure and treatment not carried out due to patient leaving prior to being seen by health care provider: Secondary | ICD-10-CM | POA: Diagnosis not present

## 2021-02-16 DIAGNOSIS — R1110 Vomiting, unspecified: Secondary | ICD-10-CM | POA: Diagnosis not present

## 2021-02-16 DIAGNOSIS — M62838 Other muscle spasm: Secondary | ICD-10-CM | POA: Insufficient documentation

## 2021-02-16 LAB — COMPREHENSIVE METABOLIC PANEL
ALT: 19 U/L (ref 0–44)
AST: 28 U/L (ref 15–41)
Albumin: 4 g/dL (ref 3.5–5.0)
Alkaline Phosphatase: 64 U/L (ref 38–126)
Anion gap: 10 (ref 5–15)
BUN: 9 mg/dL (ref 6–20)
CO2: 27 mmol/L (ref 22–32)
Calcium: 9.1 mg/dL (ref 8.9–10.3)
Chloride: 99 mmol/L (ref 98–111)
Creatinine, Ser: 0.9 mg/dL (ref 0.61–1.24)
GFR, Estimated: >60 mL/min (ref >60)
Glucose, Bld: 117 mg/dL — ABNORMAL HIGH (ref 70–99)
Potassium: 3.8 mmol/L (ref 3.5–5.1)
Sodium: 136 mmol/L (ref 135–145)
Total Bilirubin: 1.1 mg/dL (ref 0.3–1.2)
Total Protein: 7.3 g/dL (ref 6.5–8.1)

## 2021-02-16 LAB — CBC WITH DIFFERENTIAL/PLATELET
Abs Immature Granulocytes: 0.01 10*3/uL (ref 0.00–0.07)
Basophils Absolute: 0 10*3/uL (ref 0.0–0.1)
Basophils Relative: 0 %
Eosinophils Absolute: 0.1 10*3/uL (ref 0.0–0.5)
Eosinophils Relative: 1 %
HCT: 42.8 % (ref 39.0–52.0)
Hemoglobin: 14.7 g/dL (ref 13.0–17.0)
Immature Granulocytes: 0 %
Lymphocytes Relative: 40 %
Lymphs Abs: 2 10*3/uL (ref 0.7–4.0)
MCH: 32.2 pg (ref 26.0–34.0)
MCHC: 34.3 g/dL (ref 30.0–36.0)
MCV: 93.9 fL (ref 80.0–100.0)
Monocytes Absolute: 0.5 10*3/uL (ref 0.1–1.0)
Monocytes Relative: 10 %
Neutro Abs: 2.3 10*3/uL (ref 1.7–7.7)
Neutrophils Relative %: 49 %
Platelets: 150 10*3/uL (ref 150–400)
RBC: 4.56 MIL/uL (ref 4.22–5.81)
RDW: 12.6 % (ref 11.5–15.5)
WBC: 4.9 10*3/uL (ref 4.0–10.5)
nRBC: 0 % (ref 0.0–0.2)

## 2021-02-16 LAB — LIPASE, BLOOD: Lipase: 36 U/L (ref 11–51)

## 2021-02-16 MED ORDER — ONDANSETRON 4 MG PO TBDP: 4.0000 mg | ORAL_TABLET | Freq: Once | ORAL | Status: DC

## 2021-02-16 MED ORDER — IOHEXOL 350 MG/ML SOLN
80.0000 mL | Freq: Once | INTRAVENOUS | Status: AC | PRN
  Administered 2021-02-16: 80 mL via INTRAVENOUS

## 2021-02-16 NOTE — ED Provider Notes (Signed)
Emergency Medicine Provider Triage Evaluation Note  Austin [name] , a 55 y.o. male  was evaluated in triage.  Pt complains of nausea, vomiting, abdominal pain, muscle cramping.  Also cramping has been going on for about 2 weeks.  Mostly in the legs, but also in her hands.  Both at rest and with activity.  Over the past 2 days, patient has had nausea, vomiting, upper abdominal pain.  Feels similar to when he had pancreatitis.  Last alcoholic drink was on Friday, symptoms started on Saturday.  He also reports constipation for 2 days.  Review of Systems  Positive: N/v, abd pain, CN, musc cramps Negative: fever  Physical Exam  BP (!) 162/101 (BP Location: Left Arm)   Pulse 65   Temp 98.4 F (36.9 C) (Oral)   Resp 14   Ht '5\' 7"'$  (1.702 m)   Wt 63.5 kg   SpO2 100%   BMI 21.93 kg/m  Gen:   Awake, no distress   Resp:  Normal effort  MSK:   Moves extremities without difficulty.  No spasms noted.  Grip strength equal bilaterally Other:  Tenderness palpation of upper abdomen.  No rigidity, guarding, distention.  No vomiting in the room  Medical Decision Making  Medically screening exam initiated at 1:23 PM.  Appropriate orders placed.  Anthon R Dittman was informed that the remainder of the evaluation will be completed by another provider, this initial triage assessment does not replace that evaluation, and the importance of remaining in the ED until their evaluation is complete.  Labs and CT   Franchot Heidelberg, PA-C 02/16/21 1324    Hayden Rasmussen, MD 02/16/21 2128

## 2021-02-16 NOTE — ED Triage Notes (Signed)
Patient c/o RUQ pain x 2 weeks. Patient reports a history of pancreatitis. Patient states emesis daily x 2 days. Patient also c/o constipation x 2 days. Patient states he had a very small BM,but not a normal one. Patient c/o muscle spasms at night in his lower extremities x 2 weeks.

## 2021-02-17 ENCOUNTER — Ambulatory Visit (HOSPITAL_COMMUNITY)
Admission: EM | Admit: 2021-02-17 | Discharge: 2021-02-17 | Disposition: A | Discharge status: Home / Self Care | Payer: Medicare HMO | Attending: Family Medicine | Admitting: Family Medicine

## 2021-02-17 ENCOUNTER — Encounter (HOSPITAL_COMMUNITY): Payer: Self-pay

## 2021-02-17 ENCOUNTER — Other Ambulatory Visit: Payer: Self-pay

## 2021-02-17 DIAGNOSIS — K859 Acute pancreatitis without necrosis or infection, unspecified: Secondary | ICD-10-CM

## 2021-02-17 MED ORDER — TRAMADOL HCL 50 MG PO TABS: 50.0000 mg | ORAL_TABLET | Freq: Four times a day (QID) | ORAL | 0 refills | Status: DC | PRN

## 2021-02-17 MED ORDER — HYDROCODONE-ACETAMINOPHEN 5-325 MG PO TABS
1.0000 | ORAL_TABLET | Freq: Once | ORAL | Status: AC
  Administered 2021-02-17: 1 via ORAL

## 2021-02-17 MED ORDER — HYDROCODONE-ACETAMINOPHEN 5-325 MG PO TABS
ORAL_TABLET | ORAL | Status: AC
  Filled 2021-02-17: qty 1

## 2021-02-17 MED ORDER — ONDANSETRON HCL 4 MG/2ML IJ SOLN
INTRAMUSCULAR | Status: AC
  Filled 2021-02-17: qty 2

## 2021-02-17 MED ORDER — ONDANSETRON HCL 4 MG/2ML IJ SOLN
4.0000 mg | Freq: Once | INTRAMUSCULAR | Status: AC
  Administered 2021-02-17: 4 mg via INTRAMUSCULAR

## 2021-02-17 MED ORDER — ONDANSETRON HCL 4 MG PO TABS
4.0000 mg | ORAL_TABLET | Freq: Four times a day (QID) | ORAL | 0 refills | Status: DC
Start: 2021-02-17 — End: 2022-07-02

## 2021-02-17 NOTE — ED Triage Notes (Signed)
Pt reports muscle spasm in the leg x 2 weeks.   Reports history of pancreatitis.   Pt reports nausea, vomiting, weakness, right upper quadrant abdominal pain, radiates to middle back x 3 days. Pain is worse when sitting or standing for extended period of time.   Pt reports he went to [facility] ED last night, had some blood work done, but was not abel to wait for the results, due to waiting time.

## 2021-02-17 NOTE — Discharge Instructions (Addendum)
As we discussed, your lab work was normal, but your imaging did suggest pancreatitis.  I have sent a prescription for a pain medication that you can take every 6 hours as needed for pain, as well as Zofran which you can take every 6 hours as needed for nausea..  If you have significant worsening of your symptoms, especially if you are not able to drink anything and you have decreased your urination, you should go to the emergency room right away.

## 2021-02-17 NOTE — ED Notes (Signed)
[facility] Called for patient

## 2021-02-17 NOTE — ED Provider Notes (Signed)
[place]    CSN: [id number] Arrival date & time: 02/17/21  1916      History   Chief Complaint Chief Complaint  Patient presents with   Spasms   Back Pain    HPI Austin [name] is a 55 y.o. male.   Right upper quadrant epigastric abdominal pain Reports that this started 3 days ago Has also been having intermittent chills Reports difficulty eating, nausea, vomiting Overall feeling weak Prior history of pancreatitis with admission in March, states that the pain is similar to this States that he has not had a bowel movement since yesterday States that sitting up makes the pain worse He previously went to [facility] emergency room last night, but the pain becomes more severe when he is sitting up, so he could not stay At that time he had blood work and a CT abdomen and pelvis performed  He also notes that he has been having intermittent muscle spasms in his bilateral legs for the last 2 weeks Denies change in bladder habits or bowel habits   Past Medical History:  Diagnosis Date   Alcohol abuse    Anxiety    Arthritis    Blind in both eyes    sees lights and shadows   Elevated LFTs    Foot and toe(s), blister    bilat feet, blistering between toes   GERD (gastroesophageal reflux disease)    Glaucoma    Glaucoma as birth trauma    Headache(784.0)    Hypertension    hx of   Insomnia    Pancreatitis    UTI (lower urinary tract infection) 08/04/11   finishing abx tomorrow   Weight loss    25 lb wt loss since 08/2010   Wrist fracture, right     Patient Active Problem List   Diagnosis Date Noted   Acute alcoholic pancreatitis [id number]   Hx of total hip arthroplasty, 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   ESOPHAGOGASTRODUODENOSCOPY     ESOPHAGOGASTRODUODENOSCOPY (EGD) WITH PROPOFOL N/A 05/09/2017   Procedure: ESOPHAGOGASTRODUODENOSCOPY (EGD) WITH PROPOFOL;  Surgeon: Doran Stabler, MD;  Location: [facility];  Service: Gastroenterology;  Laterality: N/A;   EYE SURGERY Left    EYE SURGERY Right    prostetic eye   foot srugery     removal of bone near small toe   FRACTURE SURGERY Right    wrist   HARDWARE REMOVAL  08/06/2011   Procedure: HARDWARE REMOVAL;  Surgeon: Newt Minion, MD;  Location: [place];  Service: Orthopedics;  Laterality: Right;  Removal Deep Retained Hardware Right Distal Radius   MULTIPLE TOOTH EXTRACTIONS     for dentures   TOTAL HIP ARTHROPLASTY Left 06/06/2019   Procedure: LEFT TOTAL HIP ARTHROPLASTY-DIRECT ANTERIOR;  Surgeon: [name], MD;  Location: [place];  Service: Orthopedics;  Laterality: Left;   TOTAL HIP ARTHROPLASTY Right 01/07/2020   Procedure: RIGHT  TOTAL HIP ARTHROPLASTY DIRECT ANTERIOR;  Surgeon: [name], MD;  Location: [place];  Service: Orthopedics;  Laterality: Right;   UPPER GASTROINTESTINAL ENDOSCOPY         Home Medications    Prior to Admission medications   Medication Sig Start Date End Date Taking? Authorizing Provider  ondansetron (ZOFRAN) 4 MG tablet Take 1 tablet (4 mg total) by mouth every 6 (six) hours. 02/17/21  Yes Kabella Cassidy, Bernita Raisin, DO  traMADol (ULTRAM) 50 MG tablet Take 1 tablet (50 mg total) by mouth every 6 (six) hours as needed. 02/17/21  Yes Tamme Mozingo, Bernita Raisin, DO  acetaminophen (TYLENOL) 500 MG tablet Take 1,000 mg by mouth every 6 (six) hours as needed for headache (pain).    [provider]  Ascorbic Acid  (VITAMIN C PO) Take 1 tablet by mouth daily.    [provider]  B Complex Vitamins (VITAMIN-B COMPLEX) TABS Take 1 tablet by mouth daily.    [provider]  Cholecalciferol (VITAMIN D3 PO) Take 1 tablet by mouth daily.    [provider]  Cyanocobalamin (VITAMIN B-12 PO) Take 1 tablet by mouth daily.    [provider]  ibuprofen (ADVIL) 200 MG tablet Take 400-800 mg by mouth every 6 (six) hours as needed for headache (pain).    [provider]  loperamide (IMODIUM A-D) 2 MG tablet Take 2 mg by mouth See admin instructions. Take one tablet (2 mg) by mouth every morning, may take one tablet (2 mg) in the afternoon as needed for diarrhea 07/04/20   [provider]  tadalafil (CIALIS) 10 MG tablet Take 10 mg by mouth daily as needed for erectile dysfunction. 08/20/20   [provider]    Family History Family History  Problem Relation Age of Onset   Diabetes Sister    Diabetes Maternal Aunt        aunt and uncles   Hypertension Sister        aunt and uncles maternal side   Colon cancer Neg Hx    Colon polyps Neg Hx    Esophageal cancer Neg Hx    Rectal cancer Neg Hx    Stomach cancer Neg Hx     Social History Social History   Tobacco Use   Smoking status: Every Day    Types: Cigars   Smokeless tobacco: Never   Tobacco comments:    2-3 cigars a day   Vaping Use   Vaping Use: Never used  Substance Use Topics   Alcohol use: Yes   Drug use: Yes    Frequency: 7.0 times per week    Types: Marijuana    Comment: smokes marijuana 1 to 2 times daily     Allergies   Chlorhexidine   Review of Systems Review of Systems  Constitutional:  Positive for appetite change, chills, fatigue and fever.  HENT:  Negative for congestion.   Respiratory:  Negative for shortness of breath.   Cardiovascular:  Negative for chest pain.  Gastrointestinal:  Positive for abdominal pain, nausea and vomiting. Negative for blood in stool,  constipation and diarrhea.  Genitourinary:  Negative for decreased urine volume and difficulty urinating.  Musculoskeletal:  Positive for back pain (Radiation of abdominal pain to back).       Cramping in BLE  Skin:  Negative for color change.  Neurological:  Negative for dizziness.    Physical Exam Triage Vital Signs ED Triage Vitals  Enc Vitals Group  BP 02/17/21 1936 (!) 129/99     Pulse Rate 02/17/21 1936 91     Resp 02/17/21 1936 18     Temp 02/17/21 1936 98.6 F (37 C)     Temp Source 02/17/21 1936 Oral     SpO2 02/17/21 1936 98 %     Weight --      Height --      Head Circumference --      Peak Flow --      Pain Score 02/17/21 1934 9     Pain Loc --      Pain Edu? --      Excl. in [place]? --    No data found.  Updated Vital Signs BP (!) 129/99 (BP Location: Right Arm)   Pulse 91   Temp 98.6 F (37 C) (Oral)   Resp 18   SpO2 98%   Visual Acuity Right Eye Distance:   Left Eye Distance:   Bilateral Distance:    Right Eye Near:   Left Eye Near:    Bilateral Near:     Physical Exam Constitutional:      General: He is not in acute distress.    Appearance: He is not ill-appearing.  Eyes:     Comments: Bilaterally visually impaired  Cardiovascular:     Rate and Rhythm: Normal rate.  Pulmonary:     Effort: Pulmonary effort is normal. No respiratory distress.  Abdominal:     Palpations: Abdomen is soft.     Tenderness: There is abdominal tenderness. There is no guarding or rebound.     Comments: Right upper quadrant and epigastric tenderness with radiation to his right low back  Musculoskeletal:     Comments: He has good strength of his bilateral lower extremities and is able to ambulate without difficulty  Neurological:     Mental Status: He is alert and oriented to person, place, and time.  Psychiatric:        Mood and Affect: Mood normal.        Behavior: Behavior normal.        Thought Content: Thought content normal.        Judgment: Judgment  normal.     UC Treatments / Results  Labs (all labs ordered are listed, but only abnormal results are displayed) Labs Reviewed - No data to display  EKG   Radiology CT ABDOMEN PELVIS W CONTRAST  Result Date: 02/16/2021 CLINICAL DATA:  Nausea and vomiting, mid abdominal pain, cramping for 2 weeks EXAM: CT ABDOMEN AND PELVIS WITH CONTRAST TECHNIQUE: Multidetector CT imaging of the abdomen and pelvis was performed using the standard protocol following bolus administration of intravenous contrast. CONTRAST:  36m OMNIPAQUE IOHEXOL 350 MG/ML SOLN COMPARISON:  08/22/2020 FINDINGS: Lower chest: No acute pleural or parenchymal lung disease. Hepatobiliary: No focal liver abnormality is seen. No gallstones, gallbladder wall thickening, or biliary dilatation. Pancreas: There is very subtle fat stranding surrounding the uncinate process of the pancreas consistent with mild acute uncomplicated pancreatitis. The pancreatic parenchyma enhances normally. No fluid collection or pseudocyst. Spleen: Normal in size without focal abnormality. Adrenals/Urinary Tract: Adrenal glands are unremarkable. Kidneys are normal, without renal calculi, focal lesion, or hydronephrosis. Bladder is unremarkable. Evaluation of the lower pelvis is limited due to streak artifact from bilateral hip arthroplasties. Stomach/Bowel: No bowel obstruction or ileus. No bowel wall thickening or inflammatory change. The appendix, if still present, is not well visualized. Vascular/Lymphatic: Aortic atherosclerosis. No enlarged abdominal or pelvic lymph nodes. Reproductive: Prostate  is unremarkable. Evaluation limited due to streak artifact from hip arthroplasties. Other: No free fluid or free gas. Stable fat containing umbilical hernia. No bowel herniation. Musculoskeletal: No acute or destructive bony lesions. Unremarkable bilateral hip arthroplasties. Reconstructed images demonstrate no additional findings. IMPRESSION: 1. Mild acute uncomplicated  pancreatitis involving the uncinate process of the pancreas. No fluid collection or pseudocyst. 2.  Aortic Atherosclerosis (ICD10-I70.0). Electronically Signed   By: Randa Ngo M.D.   On: 02/16/2021 17:43    Procedures Procedures (including critical care time)  Medications Ordered in UC Medications  ondansetron (ZOFRAN) injection 4 mg (4 mg Intramuscular Given 02/17/21 2007)  HYDROcodone-acetaminophen (NORCO/VICODIN) 5-325 MG per tablet 1 tablet (1 tablet Oral Given 02/17/21 2007)    Initial Impression / Assessment and Plan / UC Course  I have reviewed the triage vital signs and the nursing notes.  Pertinent labs & imaging results that were available during my care of the patient were reviewed by me and considered in my medical decision making (see chart for details).     Patient is a 55 year old male with prior history of acute alcoholic pancreatitis, last admitted in March for the same, who presents with known acute pancreatitis seen on CT abdomen and pelvis performed yesterday at [facility] emergency room.  His labs are otherwise within normal limits and lipase was also within normal limits.  He has no sign of leukocytosis.  He has having some significant pain and difficulty tolerating p.o., recommended that if patient is having severe pain, that he be evaluated in the emergency room.  He declined and stated that he did not want to sit up in the waiting room because he was concerned it would make his pain worse.  Given him Zofran and hydrocodone in office as he will not be able to get a prescription tonight given that he is visually impaired and does not have anyone to drive him.  Prescription sent for tramadol and Zofran to use as needed, PMP reviewed, no red flags.  He was given ED precautions and advised if he has significant worsening of symptoms especially if he is unable to tolerate p.o., that he should go to the emergency room immediately.  He voiced understanding was discharged home  in stable condition.  In regards to the spasms that he noted, advised him that given his other current medical problems that are more pressing, would recommend discussing his 2 weeks of off-and-on muscle spasms with his PCP.  He voiced understanding. Final Clinical Impressions(s) / UC Diagnoses   Final diagnoses:  Acute pancreatitis, unspecified complication status, unspecified pancreatitis type     Discharge Instructions      As we discussed, your lab work was normal, but your imaging did suggest pancreatitis.  I have sent a prescription for a pain medication that you can take every 6 hours as needed for pain, as well as Zofran which you can take every 6 hours as needed for nausea..  If you have significant worsening of your symptoms, especially if you are not able to drink anything and you have decreased your urination, you should go to the emergency room right away.     ED Prescriptions     Medication Sig Dispense Auth. Provider   traMADol (ULTRAM) 50 MG tablet Take 1 tablet (50 mg total) by mouth every 6 (six) hours as needed. 15 tablet Eliah Ozawa J, DO   ondansetron (ZOFRAN) 4 MG tablet Take 1 tablet (4 mg total) by mouth every 6 (six) hours.  12 tablet Zaeden Lastinger, Bernita Raisin, DO      I have reviewed the PDMP during this encounter.   [place], DO 02/17/21 2026

## 2021-08-10 ENCOUNTER — Ambulatory Visit
Admission: RE | Admit: 2021-08-10 | Discharge: 2021-08-10 | Disposition: A | Discharge status: Home / Self Care | Payer: Medicare HMO | Source: Ambulatory Visit | Attending: Family Medicine | Admitting: Family Medicine

## 2021-08-10 ENCOUNTER — Other Ambulatory Visit: Payer: Self-pay | Admitting: Family Medicine

## 2021-08-10 ENCOUNTER — Other Ambulatory Visit: Payer: Self-pay

## 2021-08-10 DIAGNOSIS — M25561 Pain in right knee: Secondary | ICD-10-CM

## 2021-08-10 DIAGNOSIS — M7989 Other specified soft tissue disorders: Secondary | ICD-10-CM

## 2021-08-10 DIAGNOSIS — M25571 Pain in right ankle and joints of right foot: Secondary | ICD-10-CM

## 2021-09-07 ENCOUNTER — Encounter (HOSPITAL_BASED_OUTPATIENT_CLINIC_OR_DEPARTMENT_OTHER): Payer: Self-pay

## 2021-09-07 ENCOUNTER — Other Ambulatory Visit: Payer: Self-pay

## 2021-09-07 ENCOUNTER — Inpatient Hospital Stay (HOSPITAL_BASED_OUTPATIENT_CLINIC_OR_DEPARTMENT_OTHER)
Admission: EM | Admit: 2021-09-07 | Discharge: 2021-09-10 | DRG: 897 | Disposition: A | Discharge status: Home / Self Care | Payer: Medicare HMO | Attending: Family Medicine | Admitting: Family Medicine

## 2021-09-07 DIAGNOSIS — K860 Alcohol-induced chronic pancreatitis: Secondary | ICD-10-CM | POA: Diagnosis present

## 2021-09-07 DIAGNOSIS — H409 Unspecified glaucoma: Secondary | ICD-10-CM | POA: Diagnosis present

## 2021-09-07 DIAGNOSIS — Z20822 Contact with and (suspected) exposure to covid-19: Secondary | ICD-10-CM | POA: Diagnosis present

## 2021-09-07 DIAGNOSIS — R7989 Other specified abnormal findings of blood chemistry: Secondary | ICD-10-CM | POA: Diagnosis present

## 2021-09-07 DIAGNOSIS — R519 Headache, unspecified: Secondary | ICD-10-CM | POA: Diagnosis present

## 2021-09-07 DIAGNOSIS — F419 Anxiety disorder, unspecified: Secondary | ICD-10-CM | POA: Diagnosis present

## 2021-09-07 DIAGNOSIS — F10939 Alcohol use, unspecified with withdrawal, unspecified: Secondary | ICD-10-CM | POA: Diagnosis present

## 2021-09-07 DIAGNOSIS — R440 Auditory hallucinations: Secondary | ICD-10-CM | POA: Diagnosis present

## 2021-09-07 DIAGNOSIS — Z79899 Other long term (current) drug therapy: Secondary | ICD-10-CM

## 2021-09-07 DIAGNOSIS — F10932 Alcohol use, unspecified with withdrawal with perceptual disturbance: Principal | ICD-10-CM

## 2021-09-07 DIAGNOSIS — R45851 Suicidal ideations: Secondary | ICD-10-CM

## 2021-09-07 DIAGNOSIS — Z888 Allergy status to other drugs, medicaments and biological substances status: Secondary | ICD-10-CM

## 2021-09-07 DIAGNOSIS — G4700 Insomnia, unspecified: Secondary | ICD-10-CM | POA: Diagnosis present

## 2021-09-07 DIAGNOSIS — I10 Essential (primary) hypertension: Secondary | ICD-10-CM | POA: Diagnosis present

## 2021-09-07 DIAGNOSIS — H543 Unqualified visual loss, both eyes: Secondary | ICD-10-CM | POA: Diagnosis present

## 2021-09-07 DIAGNOSIS — Z96643 Presence of artificial hip joint, bilateral: Secondary | ICD-10-CM | POA: Diagnosis present

## 2021-09-07 DIAGNOSIS — F17290 Nicotine dependence, other tobacco product, uncomplicated: Secondary | ICD-10-CM | POA: Diagnosis present

## 2021-09-07 DIAGNOSIS — Z833 Family history of diabetes mellitus: Secondary | ICD-10-CM

## 2021-09-07 DIAGNOSIS — K219 Gastro-esophageal reflux disease without esophagitis: Secondary | ICD-10-CM | POA: Diagnosis present

## 2021-09-07 DIAGNOSIS — F10231 Alcohol dependence with withdrawal delirium: Principal | ICD-10-CM | POA: Diagnosis present

## 2021-09-07 DIAGNOSIS — R112 Nausea with vomiting, unspecified: Secondary | ICD-10-CM | POA: Diagnosis present

## 2021-09-07 DIAGNOSIS — D696 Thrombocytopenia, unspecified: Secondary | ICD-10-CM | POA: Diagnosis not present

## 2021-09-07 DIAGNOSIS — F10930 Alcohol use, unspecified with withdrawal, uncomplicated: Secondary | ICD-10-CM | POA: Diagnosis present

## 2021-09-07 DIAGNOSIS — Z8249 Family history of ischemic heart disease and other diseases of the circulatory system: Secondary | ICD-10-CM

## 2021-09-07 LAB — CBC WITH DIFFERENTIAL/PLATELET
Abs Immature Granulocytes: 0.03 10*3/uL (ref 0.00–0.07)
Basophils Absolute: 0 10*3/uL (ref 0.0–0.1)
Basophils Relative: 0 %
Eosinophils Absolute: 0 10*3/uL (ref 0.0–0.5)
Eosinophils Relative: 0 %
HCT: 43.9 % (ref 39.0–52.0)
Hemoglobin: 15.2 g/dL (ref 13.0–17.0)
Immature Granulocytes: 1 %
Lymphocytes Relative: 24 %
Lymphs Abs: 1.3 10*3/uL (ref 0.7–4.0)
MCH: 31.4 pg (ref 26.0–34.0)
MCHC: 34.6 g/dL (ref 30.0–36.0)
MCV: 90.7 fL (ref 80.0–100.0)
Monocytes Absolute: 0.4 10*3/uL (ref 0.1–1.0)
Monocytes Relative: 7 %
Neutro Abs: 3.6 10*3/uL (ref 1.7–7.7)
Neutrophils Relative %: 68 %
Platelets: 102 10*3/uL — ABNORMAL LOW (ref 150–400)
RBC: 4.84 MIL/uL (ref 4.22–5.81)
RDW: 13 % (ref 11.5–15.5)
WBC: 5.2 10*3/uL (ref 4.0–10.5)
nRBC: 0 % (ref 0.0–0.2)

## 2021-09-07 LAB — COMPREHENSIVE METABOLIC PANEL
ALT: 86 U/L — ABNORMAL HIGH (ref 0–44)
AST: 148 U/L — ABNORMAL HIGH (ref 15–41)
Albumin: 4.5 g/dL (ref 3.5–5.0)
Alkaline Phosphatase: 113 U/L (ref 38–126)
Anion gap: 12 (ref 5–15)
BUN: 17 mg/dL (ref 6–20)
CO2: 26 mmol/L (ref 22–32)
Calcium: 10 mg/dL (ref 8.9–10.3)
Chloride: 97 mmol/L — ABNORMAL LOW (ref 98–111)
Creatinine, Ser: 1.18 mg/dL (ref 0.61–1.24)
GFR, Estimated: >60 mL/min (ref >60)
Glucose, Bld: 125 mg/dL — ABNORMAL HIGH (ref 70–99)
Potassium: 4.2 mmol/L (ref 3.5–5.1)
Sodium: 135 mmol/L (ref 135–145)
Total Bilirubin: 0.8 mg/dL (ref 0.3–1.2)
Total Protein: 7.7 g/dL (ref 6.5–8.1)

## 2021-09-07 LAB — URINALYSIS, ROUTINE W REFLEX MICROSCOPIC
Bilirubin Urine: NEGATIVE
Glucose, UA: NEGATIVE mg/dL
Ketones, ur: NEGATIVE mg/dL
Leukocytes,Ua: NEGATIVE
Nitrite: NEGATIVE
Protein, ur: NEGATIVE mg/dL
Specific Gravity, Urine: 1.009 (ref 1.005–1.030)
pH: 5.5 (ref 5.0–8.0)

## 2021-09-07 LAB — RAPID URINE DRUG SCREEN, HOSP PERFORMED
Amphetamines: NOT DETECTED
Barbiturates: NOT DETECTED
Benzodiazepines: NOT DETECTED
Cocaine: NOT DETECTED
Opiates: NOT DETECTED
Tetrahydrocannabinol: NOT DETECTED

## 2021-09-07 LAB — LIPASE, BLOOD: Lipase: 24 U/L (ref 11–51)

## 2021-09-07 LAB — VITAMIN B12: Vitamin B-12: 1913 pg/mL — ABNORMAL HIGH (ref 180–914)

## 2021-09-07 LAB — ETHANOL: Alcohol, Ethyl (B): <10 mg/dL (ref <10)

## 2021-09-07 MED ORDER — CHLORDIAZEPOXIDE HCL 25 MG PO CAPS
25.0000 mg | ORAL_CAPSULE | Freq: Four times a day (QID) | ORAL | Status: AC
  Administered 2021-09-07 – 2021-09-09 (×6): 25 mg via ORAL
  Filled 2021-09-07: qty 5
  Filled 2021-09-07: qty 1
  Filled 2021-09-07 (×2): qty 5
  Filled 2021-09-07: qty 1
  Filled 2021-09-07: qty 5

## 2021-09-07 MED ORDER — CHLORDIAZEPOXIDE HCL 25 MG PO CAPS: 25.0000 mg | ORAL_CAPSULE | ORAL | Status: DC

## 2021-09-07 MED ORDER — LORAZEPAM 1 MG PO TABS
1.0000 mg | ORAL_TABLET | Freq: Four times a day (QID) | ORAL | Status: DC | PRN
  Administered 2021-09-07: 1 mg via ORAL
  Filled 2021-09-07: qty 1

## 2021-09-07 MED ORDER — LACTATED RINGERS IV SOLN: INTRAVENOUS | Status: AC

## 2021-09-07 MED ORDER — ADULT MULTIVITAMIN W/MINERALS CH
1.0000 | ORAL_TABLET | Freq: Every day | ORAL | Status: DC
  Administered 2021-09-08 – 2021-09-10 (×3): 1 via ORAL
  Filled 2021-09-07 (×3): qty 1

## 2021-09-07 MED ORDER — CHLORDIAZEPOXIDE HCL 25 MG PO CAPS
25.0000 mg | ORAL_CAPSULE | Freq: Three times a day (TID) | ORAL | Status: AC
  Administered 2021-09-09 – 2021-09-10 (×3): 25 mg via ORAL
  Filled 2021-09-07 (×3): qty 1

## 2021-09-07 MED ORDER — THIAMINE HCL 100 MG PO TABS
100.0000 mg | ORAL_TABLET | Freq: Every day | ORAL | Status: DC
  Administered 2021-09-08 – 2021-09-10 (×3): 100 mg via ORAL
  Filled 2021-09-07 (×3): qty 1

## 2021-09-07 MED ORDER — SODIUM CHLORIDE 0.9% FLUSH
3.0000 mL | Freq: Two times a day (BID) | INTRAVENOUS | Status: DC
  Administered 2021-09-07 – 2021-09-10 (×4): 3 mL via INTRAVENOUS

## 2021-09-07 MED ORDER — HEPARIN SODIUM (PORCINE) 5000 UNIT/ML IJ SOLN
5000.0000 [IU] | Freq: Three times a day (TID) | INTRAMUSCULAR | Status: DC
  Administered 2021-09-07 – 2021-09-08 (×2): 5000 [IU] via SUBCUTANEOUS
  Filled 2021-09-07 (×3): qty 1

## 2021-09-07 MED ORDER — FOLIC ACID 1 MG PO TABS
1.0000 mg | ORAL_TABLET | Freq: Every day | ORAL | Status: DC
  Administered 2021-09-08 – 2021-09-10 (×3): 1 mg via ORAL
  Filled 2021-09-07 (×3): qty 1

## 2021-09-07 MED ORDER — THIAMINE HCL 100 MG PO TABS: 100.0000 mg | ORAL_TABLET | Freq: Every day | ORAL | Status: DC

## 2021-09-07 MED ORDER — DIPHENHYDRAMINE HCL 50 MG/ML IJ SOLN
25.0000 mg | Freq: Once | INTRAMUSCULAR | Status: AC
  Administered 2021-09-07: 25 mg via INTRAVENOUS
  Filled 2021-09-07: qty 1

## 2021-09-07 MED ORDER — PROCHLORPERAZINE EDISYLATE 10 MG/2ML IJ SOLN
10.0000 mg | Freq: Once | INTRAMUSCULAR | Status: AC
  Administered 2021-09-07: 10 mg via INTRAVENOUS
  Filled 2021-09-07: qty 2

## 2021-09-07 MED ORDER — LOPERAMIDE HCL 2 MG PO CAPS
2.0000 mg | ORAL_CAPSULE | ORAL | Status: DC | PRN
  Administered 2021-09-07: 2 mg via ORAL
  Filled 2021-09-07: qty 1

## 2021-09-07 MED ORDER — CHLORDIAZEPOXIDE HCL 25 MG PO CAPS
25.0000 mg | ORAL_CAPSULE | Freq: Every day | ORAL | Status: DC
Start: 2021-09-11 — End: 2021-09-10

## 2021-09-07 MED ORDER — KETOROLAC TROMETHAMINE 15 MG/ML IJ SOLN
15.0000 mg | Freq: Once | INTRAMUSCULAR | Status: AC
  Administered 2021-09-07: 15 mg via INTRAVENOUS
  Filled 2021-09-07: qty 1

## 2021-09-07 MED ORDER — ONDANSETRON HCL 4 MG PO TABS: 4.0000 mg | ORAL_TABLET | Freq: Four times a day (QID) | ORAL | Status: DC | PRN

## 2021-09-07 MED ORDER — HYDROXYZINE HCL 25 MG PO TABS: 25.0000 mg | ORAL_TABLET | Freq: Four times a day (QID) | ORAL | Status: DC | PRN

## 2021-09-07 MED ORDER — LORAZEPAM 1 MG PO TABS
1.0000 mg | ORAL_TABLET | ORAL | Status: DC | PRN
  Administered 2021-09-07: 2 mg via ORAL
  Administered 2021-09-08 (×2): 1 mg via ORAL
  Administered 2021-09-08: 3 mg via ORAL
  Administered 2021-09-08: 1 mg via ORAL
  Administered 2021-09-09: 2 mg via ORAL
  Administered 2021-09-09 (×2): 1 mg via ORAL
  Filled 2021-09-07 (×2): qty 1
  Filled 2021-09-07: qty 2
  Filled 2021-09-07: qty 1
  Filled 2021-09-07: qty 2
  Filled 2021-09-07: qty 1
  Filled 2021-09-07: qty 3
  Filled 2021-09-07: qty 1

## 2021-09-07 MED ORDER — ONDANSETRON HCL 4 MG/2ML IJ SOLN: 4.0000 mg | Freq: Four times a day (QID) | INTRAMUSCULAR | Status: DC | PRN

## 2021-09-07 MED ORDER — LORAZEPAM 2 MG/ML IJ SOLN: 1.0000 mg | INTRAMUSCULAR | Status: DC | PRN

## 2021-09-07 MED ORDER — THIAMINE HCL 100 MG/ML IJ SOLN: 100.0000 mg | Freq: Every day | INTRAMUSCULAR | Status: DC

## 2021-09-07 MED ORDER — THIAMINE HCL 100 MG/ML IJ SOLN
100.0000 mg | Freq: Once | INTRAMUSCULAR | Status: AC
Start: 2021-09-07 — End: 2021-09-07
  Administered 2021-09-07: 100 mg via INTRAMUSCULAR
  Filled 2021-09-07: qty 2

## 2021-09-07 MED ORDER — ADULT MULTIVITAMIN W/MINERALS CH
1.0000 | ORAL_TABLET | Freq: Every day | ORAL | Status: DC
  Administered 2021-09-07: 1 via ORAL
  Filled 2021-09-07: qty 1

## 2021-09-07 MED ORDER — ONDANSETRON 4 MG PO TBDP: 4.0000 mg | ORAL_TABLET | Freq: Four times a day (QID) | ORAL | Status: DC | PRN

## 2021-09-07 MED ORDER — SODIUM CHLORIDE 0.9 % IV BOLUS
1000.0000 mL | Freq: Once | INTRAVENOUS | Status: AC
  Administered 2021-09-07: 1000 mL via INTRAVENOUS

## 2021-09-07 NOTE — Hospital Course (Signed)
Austin [name] is a 56 y.o. male with medical history significant for congenital blindness, alcohol use disorder with history of alcohol associated pancreatitis who is admitted with alcohol withdrawal. ?

## 2021-09-07 NOTE — ED Triage Notes (Signed)
Pt to ED C/O Alcohol withdrawal. Reports last drink was last night. Pt reports he has been trying to detox himself off ETOH. MC:[id number]. Reports hx Alcoholism ; heavy drinker 40 years. C/O HA, tremors  ?

## 2021-09-07 NOTE — Assessment & Plan Note (Addendum)
Mild thrombocytopenia with platelets 100,000, likely related to alcohol use.  No obvious bleeding.  Continue to monitor. ?

## 2021-09-07 NOTE — ED Notes (Signed)
[facility] arrived to transport pt. Pt stable at time of departure ?

## 2021-09-07 NOTE — Assessment & Plan Note (Addendum)
Mildly elevated AST 148, ALT 86 likely secondary to alcohol use.  ?-LFTs improving, AST 77, ALT 69 ?-Continue to monitor. ?

## 2021-09-07 NOTE — Assessment & Plan Note (Addendum)
Patient reports recent thoughts of harming self without active plan. ?-Safety/suicide precautions ?-Psych consulted, patient does not meet criteria for inpatient psych ?-Started on Lexapro 5 mg daily ?

## 2021-09-07 NOTE — ED Provider Notes (Signed)
?[place] EMERGENCY DEPT ?Provider Note ? ? ?CSN: [id number] ?Arrival date & time: 09/07/21  1514 ? ?  ? ?History ? ?Chief Complaint  ?Patient presents with  ? Delirium Tremens (DTS)  ? Alcohol Problem  ? ? ?Austin [name] is a 56 y.o. male 56 year old male with a past medical history significant for blindness, alcohol abuse for 40 years, reports that he drinks around 1/5 a day or more on the weekends, history of pancreatitis multiple times who presents with concern for alcohol withdrawal, headache, tremors, nausea, vomiting, abdominal pain.  He reports that he stopped drinking last Monday, and immediately started feeling like he was sick, was treated for presumed sinusitis at urgent care.  Has been taking Decadron, does not seem you have been prescribed an antibiotic.  He arrives with a temperature of 100.2.  No previous history of alcohol withdrawal induced seizures, DTs.  He endorses significant diarrhea, abdominal pain.  He rates his headache severity 11/10.  He denies unilateral arm tingling, numbness.  He does endorse some pins and needle sensation throughout the body.  He reports he has some increased anxiety.  He reports increased startle reflex, potentially may have some hallucinations, reports that he hears his girlfriend saying something that she denies.  He does not endorse knowing that she is not saying anything male.  He endorsed some passive SI in triage, denies active plan.  No previous history of suicide attempt. ? ? ?Alcohol Problem ?Associated symptoms include abdominal pain and headaches.  ? ?  ? ?Home Medications ?Prior to Admission medications   ?Medication Sig Start Date End Date Taking? Authorizing Provider  ?acetaminophen (TYLENOL) 500 MG tablet Take 1,000 mg by mouth every 6 (six) hours as needed for headache (pain).    [provider]  ?Ascorbic Acid (VITAMIN C PO) Take 1 tablet by mouth daily.    [provider]  ?B Complex Vitamins (VITAMIN-B COMPLEX) TABS  Take 1 tablet by mouth daily.    [provider]  ?Cholecalciferol (VITAMIN D3 PO) Take 1 tablet by mouth daily.    [provider]  ?Cyanocobalamin (VITAMIN B-12 PO) Take 1 tablet by mouth daily.    [provider]  ?ibuprofen (ADVIL) 200 MG tablet Take 400-800 mg by mouth every 6 (six) hours as needed for headache (pain).    [provider]  ?loperamide (IMODIUM A-D) 2 MG tablet Take 2 mg by mouth See admin instructions. Take one tablet (2 mg) by mouth every morning, may take one tablet (2 mg) in the afternoon as needed for diarrhea 07/04/20   [provider]  ?ondansetron (ZOFRAN) 4 MG tablet Take 1 tablet (4 mg total) by mouth every 6 (six) hours. 02/17/21   Meccariello, Bernita Raisin, DO  ?tadalafil (CIALIS) 10 MG tablet Take 10 mg by mouth daily as needed for erectile dysfunction. 08/20/20   [provider]  ?traMADol (ULTRAM) 50 MG tablet Take 1 tablet (50 mg total) by mouth every 6 (six) hours as needed. 02/17/21   Meccariello, Bernita Raisin, DO  ?   ? ?Allergies    ?Chlorhexidine   ? ?Review of Systems   ?Review of Systems  ?Constitutional:  Positive for appetite change, chills, fatigue and fever.  ?Gastrointestinal:  Positive for abdominal pain and diarrhea.  ?Neurological:  Positive for tremors, weakness and headaches.  ?All other systems reviewed and are negative. ? ?Physical Exam ?Updated Vital Signs ?BP (!) 128/95   Pulse 84   Temp 100.2 ?F (37.9 ?C) (Oral)  Resp 17   Ht '5\' 7"'$  (1.702 m)   Wt 61.2 kg   SpO2 95%   BMI 21.14 kg/m?  ?Physical Exam ?Vitals and nursing note reviewed.  ?Constitutional:   ?   General: He is not in acute distress. ?   Appearance: Normal appearance. He is ill-appearing.  ?HENT:  ?   Head: Normocephalic and atraumatic.  ?   Mouth/Throat:  ?   Comments: Poor dentition, no evidence of redness, erythema, tonsillar exudate, PTA, or other abnormality in the oropharynx. ?Eyes:  ?   General:     ?   Right eye: Discharge present.     ?    Left eye: No discharge.  ?   Comments: Patient with prosthesis in place on the right, some potential discharge noted the right eye.  Left eye with blindness, no evidence of abnormality.  ?Cardiovascular:  ?   Rate and Rhythm: Normal rate and regular rhythm.  ?   Heart sounds: No murmur heard. ?  No friction rub. No gallop.  ?Pulmonary:  ?   Effort: Pulmonary effort is normal.  ?   Breath sounds: Normal breath sounds.  ?   Comments: Clear lung sounds throughout. ?Abdominal:  ?   General: Bowel sounds are normal.  ?   Palpations: Abdomen is soft.  ?   Comments: Generalized tenderness throughout the abdomen.  No rebound, rigidity, minimal guarding.  Normal bowel sounds throughout.  ?Skin: ?   General: Skin is warm and dry.  ?   Capillary Refill: Capillary refill takes less than 2 seconds.  ?Neurological:  ?   Mental Status: He is alert and oriented to person, place, and time.  ?   Comments: Cranial nerves grossly intact.  Intact extraocular movements of nonprosthetic eye.  Cannot assess for pupillary reflexes patient is blind.  Intact heel-to-shin.  Romberg with some unsteadiness, patient able to ambulate however.  Patient is alert and and oriented to self, location, he grossly knows what time it is, but cannot give me the exact date.  Moves all 4 limbs spontaneously.  No pronator drift.  Intact strength 5 out of 5 bilateral upper and lower extremities.  He has minimal tremor with arms extended.  No tremor noted with arms at rest. ? ?  ?Psychiatric:     ?   Mood and Affect: Mood normal.     ?   Behavior: Behavior normal.  ? ? ?ED Results / Procedures / Treatments   ?Labs ?(all labs ordered are listed, but only abnormal results are displayed) ?Labs Reviewed  ?CBC WITH DIFFERENTIAL/PLATELET - Abnormal; Notable for the following components:  ?    Result Value  ? Platelets 102 (*)   ? All other components within normal limits  ?COMPREHENSIVE METABOLIC PANEL - Abnormal; Notable for the following components:  ? Chloride 97  (*)   ? Glucose, Bld 125 (*)   ? AST 148 (*)   ? ALT 86 (*)   ? All other components within normal limits  ?URINALYSIS, ROUTINE W REFLEX MICROSCOPIC - Abnormal; Notable for the following components:  ? Hgb urine dipstick MODERATE (*)   ? All other components within normal limits  ?LIPASE, BLOOD  ?ETHANOL  ?RAPID URINE DRUG SCREEN, HOSP PERFORMED  ?VITAMIN B12  ? ? ?EKG ?EKG Interpretation ? ?Date/Time:  Monday September 07 2021 15:28:15 EDT ?Ventricular Rate:  82 ?PR Interval:  133 ?QRS Duration: 80 ?QT Interval:  365 ?QTC Calculation: 427 ?R Axis:   40 ?Text Interpretation:  Sinus rhythm since last tracing no significant change Confirmed by Malvin Johns [phone]) on 09/07/2021 5:17:21 PM ? ?Radiology ?No results found. ? ?Procedures ?Procedures  ? ? ?Medications Ordered in ED ?Medications  ?thiamine tablet 100 mg (has no administration in time range)  ?multivitamin with minerals tablet 1 tablet (1 tablet Oral Given 09/07/21 1609)  ?LORazepam (ATIVAN) tablet 1 mg (1 mg Oral Given 09/07/21 1612)  ?hydrOXYzine (ATARAX) tablet 25 mg (has no administration in time range)  ?loperamide (IMODIUM) capsule 2-4 mg (2 mg Oral Given 09/07/21 1609)  ?ondansetron (ZOFRAN-ODT) disintegrating tablet 4 mg (has no administration in time range)  ?thiamine (B-1) injection 100 mg (100 mg Intramuscular Given 09/07/21 1613)  ?sodium chloride 0.9 % bolus 1,000 mL (0 mLs Intravenous Stopped 09/07/21 1841)  ?prochlorperazine (COMPAZINE) injection 10 mg (10 mg Intravenous Given 09/07/21 1610)  ?ketorolac (TORADOL) 15 MG/ML injection 15 mg (15 mg Intravenous Given 09/07/21 1617)  ?diphenhydrAMINE (BENADRYL) injection 25 mg (25 mg Intravenous Given 09/07/21 1610)  ? ? ?ED Course/ Medical Decision Making/ A&P ?  ?                        ?Medical Decision Making ?Amount and/or Complexity of Data Reviewed ?Labs: ordered. ? ?Risk ?OTC drugs. ?Prescription drug management. ? ? ?I discussed this case with my attending physician who cosigned this note including patient's  presenting symptoms, physical exam, and planned diagnostics and interventions. Attending physician stated agreement with plan or made changes to plan which were implemented.  ? ?This patient presents to the ED fo

## 2021-09-07 NOTE — Assessment & Plan Note (Addendum)
Patient with chronic daily alcohol use, tried to stop cold [place] about 1 week prior to admission.  CIWA score was 16 on arrival.  He also has been reporting apparent auditory hallucinations. ?-Continue CIWA protocol with Ativan as needed ?-This morning CIWA score is 5 ?-Start on Librium taper ?-Continue MVM, thiamine, folic acid ?-TOC consult ?

## 2021-09-07 NOTE — H&P (Signed)
?History and Physical  ? ? ?Austin [name] KPT:[id number] DOB: 1965/10/06 DOA: 09/07/2021 ? ?PCP: Buzzy Han, MD  ?Patient coming from: Home ? ?I have personally briefly reviewed patient's old medical records in [place] ? ?Chief Complaint: Alcohol withdrawal ? ?HPI: ?Austin [name] is a 56 y.o. male with medical history significant for congenital blindness, alcohol use disorder with history of alcohol associated pancreatitis who presented to the ED for evaluation of alcohol withdrawal. ? ?Patient reportedly drinking a fifth of liquor or more daily.  He tried to completely abstain from alcohol use 1 week prior to admission.  2 days after stopping alcohol he began to develop frequent nausea, vomiting, diarrhea, chills, tremors, and headache.  Few days ago he began to develop auditory hallucinations, he was hearing his girlfriend and a man's voice which were apparently not actually present.  He has been having some abdominal discomfort.  He tried drinking some alcohol the night prior to admission to try to stave off his withdrawal symptoms without significant relief.  He also states that he has been having thoughts of hurting himself daily recently without active plan. ? ?[facility] ED Course  Labs/Imaging on admission: I have personally reviewed following labs and imaging studies. ? ?Initial vitals showed BP 162/110, pulse 79, RR 16, temp 100.2 ?F, SPO2 97% on room air. ? ?Labs show WBC 5.2, hemoglobin 15.2, platelets 102,000, sodium 135, potassium 4.2, bicarb 26, BUN 17, creatinine 1.18, serum glucose 125, AST 148, ALT 86, alk phos 113, total bilirubin 0.8, lipase 24, serum ethanol <10, vitamin B12 1913.  Urinalysis negative for UTI.  UDS negative. ? ?Patient was given 1 L normal saline, IV Compazine, IV Toradol, IV Benadryl.  CIWA score was 16 on arrival and he was placed on CIWA protocol and received total of 1 mg Ativan.  The hospitalist service was consulted to admit for further  evaluation and management. ? ?Review of Systems: All systems reviewed and are negative except as documented in history of present illness above. ? ? ?Past Medical History:  ?Diagnosis Date  ? Alcohol abuse   ? Anxiety   ? Arthritis   ? Blind in both eyes   ? sees lights and shadows  ? Elevated LFTs   ? Foot and toe(s), blister   ? bilat feet, blistering between toes  ? GERD (gastroesophageal reflux disease)   ? Glaucoma   ? Glaucoma as birth trauma   ? Headache(784.0)   ? Hypertension   ? hx of  ? Insomnia   ? Pancreatitis   ? UTI (lower urinary tract infection) 08/04/11  ? finishing abx tomorrow  ? Weight loss   ? 25 lb wt loss since 08/2010  ? Wrist fracture, right   ? ? ?Past Surgical History:  ?Procedure Laterality Date  ? COLONOSCOPY  2006?  ? ESOPHAGOGASTRODUODENOSCOPY    ? ESOPHAGOGASTRODUODENOSCOPY (EGD) WITH PROPOFOL N/A 05/09/2017  ? Procedure: ESOPHAGOGASTRODUODENOSCOPY (EGD) WITH PROPOFOL;  Surgeon: Doran Stabler, MD;  Location: [facility];  Service: Gastroenterology;  Laterality: N/A;  ? EYE SURGERY Left   ? EYE SURGERY Right   ? prostetic eye  ? foot srugery    ? removal of bone near small toe  ? FRACTURE SURGERY Right   ? wrist  ? HARDWARE REMOVAL  08/06/2011  ? Procedure: HARDWARE REMOVAL;  Surgeon: Newt Minion, MD;  Location: [place];  Service: Orthopedics;  Laterality: Right;  Removal Deep Retained Hardware Right Distal Radius  ? MULTIPLE TOOTH  EXTRACTIONS    ? for dentures  ? TOTAL HIP ARTHROPLASTY Left 06/06/2019  ? Procedure: LEFT TOTAL HIP ARTHROPLASTY-DIRECT ANTERIOR;  Surgeon: [name], MD;  Location: [place];  Service: Orthopedics;  Laterality: Left;  ? TOTAL HIP ARTHROPLASTY Right 01/07/2020  ? Procedure: RIGHT TOTAL HIP ARTHROPLASTY DIRECT ANTERIOR;  Surgeon: [name], MD;  Location: [place];  Service: Orthopedics;  Laterality: Right;  ? UPPER GASTROINTESTINAL ENDOSCOPY    ? ? ?Social History: ? reports that he has been smoking cigars. He has never used smokeless tobacco. He reports  current alcohol use. He reports current drug use. Frequency: 7.00 times per week. Drug: Marijuana. ? ?Allergies  ?Allergen Reactions  ? Chlorhexidine Rash  ?  Foley D/Ced this AM 01/08/2020  ? ? ?Family History  ?Problem Relation Age of Onset  ? Diabetes Sister   ? Diabetes Maternal Aunt   ?     aunt and uncles  ? Hypertension Sister   ?     aunt and uncles maternal side  ? Colon cancer Neg Hx   ? Colon polyps Neg Hx   ? Esophageal cancer Neg Hx   ? Rectal cancer Neg Hx   ? Stomach cancer Neg Hx   ? ? ? ?Prior to Admission medications   ?Medication Sig Start Date End Date Taking? Authorizing Provider  ?acetaminophen (TYLENOL) 500 MG tablet Take 1,000 mg by mouth every 6 (six) hours as needed for headache (pain).    [provider]  ?Ascorbic Acid (VITAMIN C PO) Take 1 tablet by mouth daily.    [provider]  ?B Complex Vitamins (VITAMIN-B COMPLEX) TABS Take 1 tablet by mouth daily.    [provider]  ?Cholecalciferol (VITAMIN D3 PO) Take 1 tablet by mouth daily.    [provider]  ?Cyanocobalamin (VITAMIN B-12 PO) Take 1 tablet by mouth daily.    [provider]  ?ibuprofen (ADVIL) 200 MG tablet Take 400-800 mg by mouth every 6 (six) hours as needed for headache (pain).    [provider]  ?loperamide (IMODIUM A-D) 2 MG tablet Take 2 mg by mouth See admin instructions. Take one tablet (2 mg) by mouth every morning, may take one tablet (2 mg) in the afternoon as needed for diarrhea 07/04/20   [provider]  ?ondansetron (ZOFRAN) 4 MG tablet Take 1 tablet (4 mg total) by mouth every 6 (six) hours. 02/17/21   Meccariello, Bernita Raisin, DO  ?tadalafil (CIALIS) 10 MG tablet Take 10 mg by mouth daily as needed for erectile dysfunction. 08/20/20   [provider]  ?traMADol (ULTRAM) 50 MG tablet Take 1 tablet (50 mg total) by mouth every 6 (six) hours as needed. 02/17/21   Meccariello, Bernita Raisin, DO  ? ? ?Physical Exam: ?Vitals:  ? 09/07/21 1900 09/07/21  1915 09/07/21 1945 09/07/21 2107  ?BP: (!) 143/90  (!) 128/96 (!) 157/92  ?Pulse: 85 78 75 73  ?Resp: _0 ?Temp:   100.1 ?F (37.8 ?C) 98.5 ?F (36.9 ?C)  ?TempSrc:   Oral Oral  ?SpO2: 99% 97% 96% 100%  ?Weight:      ?Height:    5' 7" (1.702 m)  ? ?Constitutional: Resting in bed, NAD, calm, comfortable ?Eyes: Opacified pupil with congenital blindness, lids and conjunctivae normal ?ENMT: Mucous membranes are dry. Posterior pharynx clear of any exudate or lesions.Normal dentition.  ?Neck: normal, supple, no masses. ?Respiratory: clear to auscultation bilaterally, no wheezing, no crackles. Normal respiratory  effort. No accessory muscle use.  ?Cardiovascular: Regular rate and rhythm, no murmurs / rubs / gallops. No extremity edema. 2+ pedal pulses. ?Abdomen: Mild epigastric tenderness, no masses palpated. No hepatosplenomegaly. ?Musculoskeletal: no clubbing / cyanosis. No joint deformity upper and lower extremities. Good ROM, no contractures. Normal muscle tone.  ?Skin: no rashes, lesions, ulcers. No induration ?Neurologic: Slight tremor to hands. Sensation intact. Strength 5/5 in all 4.  ?Psychiatric: Alert and oriented x 3.  Passive suicidal ideation without active plan. ? ?EKG: Personally reviewed. Sinus rhythm, rate 82, no acute ischemic changes.  Rate is faster when compared to prior. ? ?Assessment/Plan ?Principal Problem: ?  Alcohol withdrawal with inpatient treatment (HCC) ?Active Problems: ?  Elevated LFTs ?  Thrombocytopenia (HCC) ?  Passive suicidal ideations ?  ?Evander R Cottier is a 56 y.o. male with medical history significant for congenital blindness, alcohol use disorder with history of alcohol associated pancreatitis who is admitted with alcohol withdrawal. ? ?Assessment and Plan: ?* Alcohol withdrawal with inpatient treatment (HCC) ?Patient with chronic daily alcohol use, tried to stop cold turkey about 1 week prior to admission.  CIWA score was 16 on arrival.  He also has been reporting apparent  auditory hallucinations. ?-Continue CIWA protocol with Ativan as needed ?-Start on Librium taper ?-Continue IV fluid hydration overnight ?-Continue MVM, thiamine, folic acid ?-TOC consult ? ?Elevated LFTs ?[place]

## 2021-09-08 DIAGNOSIS — D696 Thrombocytopenia, unspecified: Secondary | ICD-10-CM | POA: Diagnosis not present

## 2021-09-08 DIAGNOSIS — R7989 Other specified abnormal findings of blood chemistry: Secondary | ICD-10-CM

## 2021-09-08 DIAGNOSIS — R45851 Suicidal ideations: Secondary | ICD-10-CM

## 2021-09-08 DIAGNOSIS — F10932 Alcohol use, unspecified with withdrawal with perceptual disturbance: Secondary | ICD-10-CM | POA: Diagnosis not present

## 2021-09-08 LAB — COMPREHENSIVE METABOLIC PANEL
ALT: 69 U/L — ABNORMAL HIGH (ref 0–44)
AST: 98 U/L — ABNORMAL HIGH (ref 15–41)
Albumin: 3.3 g/dL — ABNORMAL LOW (ref 3.5–5.0)
Alkaline Phosphatase: 84 U/L (ref 38–126)
Anion gap: 5 (ref 5–15)
BUN: 15 mg/dL (ref 6–20)
CO2: 27 mmol/L (ref 22–32)
Calcium: 9 mg/dL (ref 8.9–10.3)
Chloride: 107 mmol/L (ref 98–111)
Creatinine, Ser: 1.02 mg/dL (ref 0.61–1.24)
GFR, Estimated: >60 mL/min (ref >60)
Glucose, Bld: 99 mg/dL (ref 70–99)
Potassium: 3.8 mmol/L (ref 3.5–5.1)
Sodium: 139 mmol/L (ref 135–145)
Total Bilirubin: 0.7 mg/dL (ref 0.3–1.2)
Total Protein: 6 g/dL — ABNORMAL LOW (ref 6.5–8.1)

## 2021-09-08 LAB — MAGNESIUM: Magnesium: 1.9 mg/dL (ref 1.7–2.4)

## 2021-09-08 LAB — CBC
HCT: 37 % — ABNORMAL LOW (ref 39.0–52.0)
Hemoglobin: 13.1 g/dL (ref 13.0–17.0)
MCH: 32.8 pg (ref 26.0–34.0)
MCHC: 35.4 g/dL (ref 30.0–36.0)
MCV: 92.5 fL (ref 80.0–100.0)
Platelets: 89 10*3/uL — ABNORMAL LOW (ref 150–400)
RBC: 4 MIL/uL — ABNORMAL LOW (ref 4.22–5.81)
RDW: 13.2 % (ref 11.5–15.5)
WBC: 5.4 10*3/uL (ref 4.0–10.5)
nRBC: 0 % (ref 0.0–0.2)

## 2021-09-08 LAB — HIV ANTIBODY (ROUTINE TESTING W REFLEX): HIV Screen 4th Generation wRfx: NONREACTIVE

## 2021-09-08 MED ORDER — ESCITALOPRAM OXALATE 10 MG PO TABS
5.0000 mg | ORAL_TABLET | Freq: Every day | ORAL | Status: DC
  Administered 2021-09-09: 5 mg via ORAL
  Filled 2021-09-08: qty 1

## 2021-09-08 MED ORDER — OXYCODONE HCL 5 MG PO TABS
5.0000 mg | ORAL_TABLET | Freq: Four times a day (QID) | ORAL | Status: DC | PRN
  Administered 2021-09-08 – 2021-09-09 (×3): 5 mg via ORAL
  Filled 2021-09-08 (×3): qty 1

## 2021-09-08 MED ORDER — ORAL CARE MOUTH RINSE
15.0000 mL | Freq: Two times a day (BID) | OROMUCOSAL | Status: DC
  Administered 2021-09-08 – 2021-09-10 (×3): 15 mL via OROMUCOSAL

## 2021-09-08 NOTE — Consult Note (Signed)
[facility] Face-to-Face Psychiatry Consult  ? ?Reason for Consult:  suicidal screening positive ?Referring Physician:  Dr. Darrick Meigs ?Patient Identification: Adriann R Aytes ?MRN:  [id number] ?Principal Diagnosis: Alcohol withdrawal with inpatient treatment [facility]) ?Diagnosis:  Principal Problem: ?  Alcohol withdrawal with inpatient treatment [facility]) ?Active Problems: ?  Thrombocytopenia ([place]) ?  Elevated LFTs ?  Passive suicidal ideations ? ? ?Total Time spent with patient: 45 minutes ? ?Subjective:   ?Shahin R Dresch is a 56 y.o. male patient admitted with alcohol withdrawal.  Mr. Correll Denbow Foti has an extensive history of alcohol use disorder.  Patient states he drinks about 1 pint a day Monday through Thursday, and 1/5 of liquor Friday through Sunday.  He does endorse worsening depressive symptoms for the past 3 to 4 months in which he describes as" feeling very unhappy, not man enough to have the woman I have.  My job is stressing me out.  I wake up every morning aching and worrying, wondering why do I continue to wake up every day if I have to feel like this."  Patient does endorse 1.  Of sobriety about 5 years ago, however he was unsuccessful as he did not complete treatment and or compliant with his psychiatric medication.  Outside of alcohol use disorder, he denies any additional psychiatric diagnosis.  He denies any previous psychiatric history to include taking psychotropic medication, inpatient admission, outpatient therapy.  He denies any history of suicide attempts, nonsuicidal self-injurious behavior, violent, aggressive behavior.  He denies any illicit substances with the exception of marijuana, and furthermore denies any legal charges.  Patient does endorse parasuicidal statements, that is surrounded by guilt in his actions.  He does express much motivation for seeking help and is future oriented.  He is interested in outpatient services, however he currently works from 3 AM to 7:30 PM Monday through Thursday and is only  available on Friday.  He does agree after much discussion on antidepressants to proceed with Lexapro 5 mg p.o. daily.  At this present time patient denies any suicidal ideations, homicidal ideations, and or auditory or visual hallucinations. ?  ?Patient seen and reassessed by the psychiatric nurse practitioner; chart reviewed and case discussed with Dr. Lovette Cliche.  On evaluation Wendle R Rasnick  is observed to be sitting in his bedside chair eyes closed however he does wake up and acknowledge entry into the room.  He also sits up to participate and engage with Probation officer.  Patient does show willingness to participate in his treatment plan, denies having any comments questions or concerns.  He is able to verbalize wanting to go home, and importance of taking his medications and alcohol cessation. Patient continues to denies suicidal ideations, homicidal ideations, and or auditory or visual hallucinations.  He does not appear to be responding to internal stimuli, external stimuli, and or exhibiting delusional thought disorder.   ? ?HPI:  Egon R Blanchette is a 56 y.o. male with medical history significant for congenital blindness, alcohol use disorder with history of alcohol associated pancreatitis who presented to the ED for evaluation of alcohol withdrawal. ? ?Patient reportedly drinking a fifth of liquor or more daily.  He tried to completely abstain from alcohol use 1 week prior to admission.  2 days after stopping alcohol he began to develop frequent nausea, vomiting, diarrhea, chills, tremors, and headache.  Few days ago he began to develop auditory hallucinations, he was hearing his girlfriend and a man's voice which were apparently not actually present.  He has  been having some abdominal discomfort.  He tried drinking some alcohol the night prior to admission to try to stave off his withdrawal symptoms without significant relief.  He also states that he has been having thoughts of hurting himself daily recently without  active plan. ? ?Past Psychiatric History: No previous psychiatric diagnosis.  ? ?Risk to Self:  Denies ?Risk to Others:   Denies ?Prior Inpatient Therapy:   Denies ?Prior Outpatient Therapy:   Denies ? ?Past Medical History:  ?Past Medical History:  ?Diagnosis Date  ? Alcohol abuse   ? Anxiety   ? Arthritis   ? Blind in both eyes   ? sees lights and shadows  ? Elevated LFTs   ? Foot and toe(s), blister   ? bilat feet, blistering between toes  ? GERD (gastroesophageal reflux disease)   ? Glaucoma   ? Glaucoma as birth trauma   ? Headache(784.0)   ? Hypertension   ? hx of  ? Insomnia   ? Pancreatitis   ? UTI (lower urinary tract infection) 08/04/11  ? finishing abx tomorrow  ? Weight loss   ? 25 lb wt loss since 08/2010  ? Wrist fracture, right   ?  ?Past Surgical History:  ?Procedure Laterality Date  ? COLONOSCOPY  2006?  ? ESOPHAGOGASTRODUODENOSCOPY    ? ESOPHAGOGASTRODUODENOSCOPY (EGD) WITH PROPOFOL N/A 05/09/2017  ? Procedure: ESOPHAGOGASTRODUODENOSCOPY (EGD) WITH PROPOFOL;  Surgeon: Doran Stabler, MD;  [facility];  Service: Gastroenterology;  Laterality: N/A;  ? EYE SURGERY Left   ? EYE SURGERY Right   ? prostetic eye  ? foot srugery    ? removal of bone near small toe  ? FRACTURE SURGERY Right   ? wrist  ? HARDWARE REMOVAL  08/06/2011  ? Procedure: HARDWARE REMOVAL;  Surgeon: Newt Minion, MD;  Location: [place];  Service: Orthopedics;  Laterality: Right;  Removal Deep Retained Hardware Right Distal Radius  ? MULTIPLE TOOTH EXTRACTIONS    ? for dentures  ? TOTAL HIP ARTHROPLASTY Left 06/06/2019  ? Procedure: LEFT TOTAL HIP ARTHROPLASTY-DIRECT ANTERIOR;  Surgeon: [name], MD;  Location: [place];  Service: Orthopedics;  Laterality: Left;  ? TOTAL HIP ARTHROPLASTY Right 01/07/2020  ? Procedure: RIGHT TOTAL HIP ARTHROPLASTY DIRECT ANTERIOR;  Surgeon: [name], MD;  Location: [place];  Service: Orthopedics;  Laterality: Right;  ? UPPER GASTROINTESTINAL ENDOSCOPY    ? ?Family History:  ?Family History   ?Problem Relation Age of Onset  ? Diabetes Sister   ? Diabetes Maternal Aunt   ?     aunt and uncles  ? Hypertension Sister   ?     aunt and uncles maternal side  ? Colon cancer Neg Hx   ? Colon polyps Neg Hx   ? Esophageal cancer Neg Hx   ? Rectal cancer Neg Hx   ? Stomach cancer Neg Hx   ? ?Family Psychiatric  History: Denies ?Social History:  ?Social History  ? ?Substance and Sexual Activity  ?Alcohol Use Yes  ?   ?Social History  ? ?Substance and Sexual Activity  ?Drug Use Yes  ? Frequency: 7.0 times per week  ? Types: Marijuana  ? Comment: smokes marijuana 1 to 2 times daily  ?  ?Social History  ? ?Socioeconomic History  ? Marital status: Significant Other  ?  Spouse name: Not on file  ? Number of children: 1  ? Years of education: Not on file  ? Highest education level: Not on  file  ?Occupational History  ? Occupation: disabled  ?Tobacco Use  ? Smoking status: Every Day  ?  Types: Cigars  ? Smokeless tobacco: Never  ? Tobacco comments:  ?  2-3 cigars a day   ?Vaping Use  ? Vaping Use: Never used  ?Substance and Sexual Activity  ? Alcohol use: Yes  ? Drug use: Yes  ?  Frequency: 7.0 times per week  ?  Types: Marijuana  ?  Comment: smokes marijuana 1 to 2 times daily  ? Sexual activity: Not Currently  ?Other Topics Concern  ? Not on file  ?Social History Narrative  ? Not on file  ? ?Social Determinants of Health  ? ?Financial Resource Strain: Not on file  ?Food Insecurity: Not on file  ?Transportation Needs: Not on file  ?Physical Activity: Not on file  ?Stress: Not on file  ?Social Connections: Not on file  ? ?Additional Social History: ?  ? ?Allergies:   ?Allergies  ?Allergen Reactions  ? Chlorhexidine Rash  ?  Foley D/Ced this AM 01/08/2020  ? ? ?Labs:  ?Results for orders placed or performed during the hospital encounter of 09/07/21 (from the past 48 hour(s))  ?CBC with Differential     Status: Abnormal  ? Collection Time: 09/07/21  3:59 PM  ?Result Value Ref Range  ? WBC 5.2 4.0 - 10.5 K/uL  ? RBC 4.84 4.22  - 5.81 MIL/uL  ? Hemoglobin 15.2 13.0 - 17.0 g/dL  ? HCT 43.9 39.0 - 52.0 %  ? MCV 90.7 80.0 - 100.0 fL  ? MCH 31.4 26.0 - 34.0 pg  ? MCHC 34.6 30.0 - 36.0 g/dL  ? RDW 13.0 11.5 - 15.5 %  ? Platelet

## 2021-09-08 NOTE — Progress Notes (Signed)
OK with on call to give pt librium at this time with ativan for ciwa score.   ?

## 2021-09-08 NOTE — Progress Notes (Addendum)
[facility] ? ?PROGRESS NOTE ? ?Austin [name] CWC:[id number] DOB: [birth year] DOA: 09/07/2021 ?PCP: Buzzy Han, MD ? ?Brief hospital course ? ?Austin [name] is a 56 y.o. male with medical history significant for congenital blindness, alcohol use disorder with history of alcohol associated pancreatitis who is admitted with alcohol withdrawal.  ? ? ? ?Subjective  ? ?Patient seen and examined, denies tremors. ? ?  ? ?Assessment and Plan: ? ?* Alcohol withdrawal with inpatient treatment [facility]) ?Patient with chronic daily alcohol use, tried to stop cold [place] about 1 week prior to admission.  CIWA score was 16 on arrival.  He also has been reporting apparent auditory hallucinations. ?-Continue CIWA protocol with Ativan as needed ?-Start on Librium taper ?-Continue MVM, thiamine, folic acid ?-TOC consult ? ?Elevated LFTs ?Mildly elevated AST 148, ALT 86 likely secondary to alcohol use.  ?-LFTs improving, AST 98, ALT 69 ?-Continue to monitor. ? ?Thrombocytopenia ([place]) ?Mild thrombocytopenia with platelets 102,000, likely related to alcohol use.  No obvious bleeding.  Continue to monitor. ? ?Passive suicidal ideations ?Patient reports recent thoughts of harming self without active plan. ?-Safety/suicide precautions ?-We will consult psychiatry for suicidal ideations ? ? ? ? ?  ?Medications ? ?  ? chlordiazePOXIDE  25 mg Oral QID  ? Followed by  ? [START ON 09/09/2021] chlordiazePOXIDE  25 mg Oral TID  ? Followed by  ? [START ON 09/10/2021] chlordiazePOXIDE  25 mg Oral BH-qamhs  ? Followed by  ? [START ON 09/11/2021] chlordiazePOXIDE  25 mg Oral Daily  ? folic acid  1 mg Oral Daily  ? heparin  5,000 Units Subcutaneous Q8H  ? mouth rinse  15 mL Mouth Rinse BID  ? multivitamin with minerals  1 tablet Oral Daily  ? sodium chloride flush  3 mL Intravenous Q12H  ? thiamine  100 mg Oral Daily  ? Or  ? thiamine  100 mg Intravenous Daily  ? ? ? Data Reviewed:  ? ?CBG: ? ?No results for input(s): GLUCAP in the last 168  hours. ? ?SpO2: 100 % ?O2 Flow Rate (L/min): 0 L/min  ? ? ?Vitals:  ? 09/07/21 1945 09/07/21 2107 09/08/21 0110 09/08/21 1228  ?BP: (!) 128/96 (!) 157/92 119/81 133/83  ?Pulse: 75 73 69 76  ?Resp: '16 18 19 15  '$ ?Temp: 100.1 ?F (37.8 ?C) 98.5 ?F (36.9 ?C) 98 ?F (36.7 ?C) 98.2 ?F (36.8 ?C)  ?TempSrc: Oral Oral  Oral  ?SpO2: 96% 100% 98% 100%  ?Weight:      ?Height:  '5\' 7"'$  (1.702 m)    ? ? ? ? ?Data Reviewed: ? ?Basic Metabolic Panel: ?Recent Labs  ?Lab 09/07/21 ?1559 09/08/21 ?0327  ?NA 135 139  ?K 4.2 3.8  ?CL 97* 107  ?CO2 26 27  ?GLUCOSE 125* 99  ?BUN 17 15  ?CREATININE 1.18 1.02  ?CALCIUM 10.0 9.0  ?MG  --  1.9  ? ? ?CBC: ?Recent Labs  ?Lab 09/07/21 ?1559 09/08/21 ?0327  ?WBC 5.2 5.4  ?NEUTROABS 3.6  --   ?HGB 15.2 13.1  ?HCT 43.9 37.0*  ?MCV 90.7 92.5  ?PLT 102* 89*  ? ? ?LFT ?Recent Labs  ?Lab 09/07/21 ?1559 09/08/21 ?0327  ?AST 148* 98*  ?ALT 86* 69*  ?ALKPHOS 113 84  ?BILITOT 0.8 0.7  ?PROT 7.7 6.0*  ?ALBUMIN 4.5 3.3*  ? ?  ?Micro :  ? ? ? ?Antibiotics: ?Anti-infectives (From admission, onward)  ? ? None  ? ?  ? ? ? ? ? ?DVT prophylaxis: We  will discontinue heparin and start SCDs bilaterally ? ?Code Status: Full code ? ?Family Communication: No family at bedside ? ?CONSULTS: ? ? ? ?Objective  ? ? ?Physical Examination: ? ? ?General-appears in no acute distress ?Heart-S1-S2, regular, no murmur auscultated ?Lungs-clear to auscultation bilaterally, no wheezing or crackles auscultated ?Abdomen-soft, nontender, no organomegaly ?Extremities-no edema in the lower extremities ?Neuro-alert, oriented x3, no focal deficit noted ? ?Status is: Inpatient: Alcohol withdrawal ? ? ? ?  ? ? ? ? ? ?Oswald Hillock ?  ?[facility] ?If 7PM-7AM, please contact night-coverage at [email or web address], ?Office  [phone] ? ? ?09/08/2021, 1:57 PM  LOS: 1 day  ? ? ? ? ? ? ? ? ? ? ?  ?

## 2021-09-09 DIAGNOSIS — R45851 Suicidal ideations: Secondary | ICD-10-CM | POA: Diagnosis not present

## 2021-09-09 DIAGNOSIS — R7989 Other specified abnormal findings of blood chemistry: Secondary | ICD-10-CM | POA: Diagnosis not present

## 2021-09-09 DIAGNOSIS — F10932 Alcohol use, unspecified with withdrawal with perceptual disturbance: Secondary | ICD-10-CM | POA: Diagnosis not present

## 2021-09-09 LAB — CBC
HCT: 37.1 % — ABNORMAL LOW (ref 39.0–52.0)
Hemoglobin: 12.8 g/dL — ABNORMAL LOW (ref 13.0–17.0)
MCH: 31.8 pg (ref 26.0–34.0)
MCHC: 34.5 g/dL (ref 30.0–36.0)
MCV: 92.3 fL (ref 80.0–100.0)
Platelets: 100 10*3/uL — ABNORMAL LOW (ref 150–400)
RBC: 4.02 MIL/uL — ABNORMAL LOW (ref 4.22–5.81)
RDW: 13.1 % (ref 11.5–15.5)
WBC: 3.9 10*3/uL — ABNORMAL LOW (ref 4.0–10.5)
nRBC: 0 % (ref 0.0–0.2)

## 2021-09-09 LAB — COMPREHENSIVE METABOLIC PANEL
ALT: 69 U/L — ABNORMAL HIGH (ref 0–44)
AST: 77 U/L — ABNORMAL HIGH (ref 15–41)
Albumin: 3.1 g/dL — ABNORMAL LOW (ref 3.5–5.0)
Alkaline Phosphatase: 73 U/L (ref 38–126)
Anion gap: 5 (ref 5–15)
BUN: 13 mg/dL (ref 6–20)
CO2: 27 mmol/L (ref 22–32)
Calcium: 8.6 mg/dL — ABNORMAL LOW (ref 8.9–10.3)
Chloride: 104 mmol/L (ref 98–111)
Creatinine, Ser: 1.03 mg/dL (ref 0.61–1.24)
GFR, Estimated: >60 mL/min (ref >60)
Glucose, Bld: 101 mg/dL — ABNORMAL HIGH (ref 70–99)
Potassium: 3.6 mmol/L (ref 3.5–5.1)
Sodium: 136 mmol/L (ref 135–145)
Total Bilirubin: 0.7 mg/dL (ref 0.3–1.2)
Total Protein: 5.7 g/dL — ABNORMAL LOW (ref 6.5–8.1)

## 2021-09-09 NOTE — Progress Notes (Addendum)
[facility] ? ?PROGRESS NOTE ? ?Austin [name] PPI:[id number] DOB: September 02, 1965 DOA: 09/07/2021 ?PCP: Buzzy Han, MD ? ?Brief hospital course ? ?Austin [name] is a 56 y.o. male with medical history significant for congenital blindness, alcohol use disorder with history of alcohol associated pancreatitis who is admitted with alcohol withdrawal.  ? ? ? ?Subjective  ? ?Patient seen and examined, no new complaints. ? ?  ? ?Assessment and Plan: ? ?* Alcohol withdrawal with inpatient treatment [facility]) ?Patient with chronic daily alcohol use, tried to stop cold [place] about 1 week prior to admission.  CIWA score was 16 on arrival.  He also has been reporting apparent auditory hallucinations. ?-Continue CIWA protocol with Ativan as needed ?-This morning CIWA score is 5 ?-Start on Librium taper ?-Continue MVM, thiamine, folic acid ?-TOC consult ? ?Elevated LFTs ?Mildly elevated AST 148, ALT 86 likely secondary to alcohol use.  ?-LFTs improving, AST 77, ALT 69 ?-Continue to monitor. ? ?Thrombocytopenia ([place]) ?Mild thrombocytopenia with platelets 100,000, likely related to alcohol use.  No obvious bleeding.  Continue to monitor. ? ?Passive suicidal ideations ?Patient reports recent thoughts of harming self without active plan. ?-Safety/suicide precautions ?-Psych consulted, patient does not meet criteria for inpatient psych ?-Started on Lexapro 5 mg daily ? ? ? ?Medications ? ?  ? chlordiazePOXIDE  25 mg Oral TID  ? Followed by  ? [START ON 09/10/2021] chlordiazePOXIDE  25 mg Oral BH-qamhs  ? Followed by  ? [START ON 09/11/2021] chlordiazePOXIDE  25 mg Oral Daily  ? escitalopram  5 mg Oral QHS  ? folic acid  1 mg Oral Daily  ? mouth rinse  15 mL Mouth Rinse BID  ? multivitamin with minerals  1 tablet Oral Daily  ? sodium chloride flush  3 mL Intravenous Q12H  ? thiamine  100 mg Oral Daily  ? Or  ? thiamine  100 mg Intravenous Daily  ? ? ? Data Reviewed:  ? ?CBG: ? ?No results for input(s): GLUCAP in the last 168  hours. ? ?SpO2: 100 % ?O2 Flow Rate (L/min): 0 L/min  ? ? ?Vitals:  ? 09/08/21 1228 09/08/21 2043 09/08/21 2100 09/09/21 0328  ?BP: 133/83 133/87  119/77  ?Pulse: 76 79  70  ?Resp: '15 17 20 15  '$ ?Temp: 98.2 ?F (36.8 ?C) 98.4 ?F (36.9 ?C)  97.7 ?F (36.5 ?C)  ?TempSrc: Oral Oral  Oral  ?SpO2: 100% 99%  100%  ?Weight:      ?Height:      ? ? ? ? ?Data Reviewed: ? ?Basic Metabolic Panel: ?Recent Labs  ?Lab 09/07/21 ?1559 09/08/21 ?0327 09/09/21 ?0327  ?NA 135 139 136  ?K 4.2 3.8 3.6  ?CL 97* 107 104  ?CO2 '26 27 27  '$ ?GLUCOSE 125* 99 101*  ?BUN '17 15 13  '$ ?CREATININE 1.18 1.02 1.03  ?CALCIUM 10.0 9.0 8.6*  ?MG  --  1.9  --   ? ? ?CBC: ?Recent Labs  ?Lab 09/07/21 ?1559 09/08/21 ?0327 09/09/21 ?0327  ?WBC 5.2 5.4 3.9*  ?NEUTROABS 3.6  --   --   ?HGB 15.2 13.1 12.8*  ?HCT 43.9 37.0* 37.1*  ?MCV 90.7 92.5 92.3  ?PLT 102* 89* 100*  ? ? ?LFT ?Recent Labs  ?Lab 09/07/21 ?1559 09/08/21 ?0327 09/09/21 ?0327  ?AST 148* 98* 77*  ?ALT 86* 69* 69*  ?ALKPHOS 113 84 73  ?BILITOT 0.8 0.7 0.7  ?PROT 7.7 6.0* 5.7*  ?ALBUMIN 4.5 3.3* 3.1*  ? ?  ?Micro :  ? ? ? ?  Antibiotics: ?Anti-infectives (From admission, onward)  ? ? None  ? ?  ? ? ? ? ? ?DVT prophylaxis: We will discontinue heparin and start SCDs bilaterally ? ?Code Status: Full code ? ?Family Communication: No family at bedside ? ?CONSULTS: ? ? ? ?Objective  ? ? ?Physical Examination: ? ?General-appears in no acute distress ?Heart-S1-S2, regular, no murmur auscultated ?Lungs-clear to auscultation bilaterally, no wheezing or crackles auscultated ?Abdomen-soft, nontender, no organomegaly ?Extremities-no edema in the lower extremities ?Neuro-alert, oriented x3, no focal deficit noted ? ? ?Status is: Inpatient: Alcohol withdrawal ? ? ?  ? ? ?Austin [name] ?  ?[facility] ?If 7PM-7AM, please contact night-coverage at [email or web address], ?Office  [phone] ? ? ?09/09/2021, 11:06 AM  LOS: 2 days  ? ? ? ? ? ? ? ? ? ? ?  ?

## 2021-09-10 DIAGNOSIS — R7989 Other specified abnormal findings of blood chemistry: Secondary | ICD-10-CM | POA: Diagnosis not present

## 2021-09-10 DIAGNOSIS — D696 Thrombocytopenia, unspecified: Secondary | ICD-10-CM | POA: Diagnosis not present

## 2021-09-10 DIAGNOSIS — R45851 Suicidal ideations: Secondary | ICD-10-CM | POA: Diagnosis not present

## 2021-09-10 DIAGNOSIS — F10932 Alcohol use, unspecified with withdrawal with perceptual disturbance: Secondary | ICD-10-CM | POA: Diagnosis not present

## 2021-09-10 MED ORDER — THIAMINE HCL 100 MG PO TABS: 100.0000 mg | ORAL_TABLET | Freq: Every day | ORAL | 3 refills | Status: DC

## 2021-09-10 MED ORDER — FOLIC ACID 1 MG PO TABS: 1.0000 mg | ORAL_TABLET | Freq: Every day | ORAL | 1 refills | Status: DC

## 2021-09-10 MED ORDER — ESCITALOPRAM OXALATE 5 MG PO TABS: 5.0000 mg | ORAL_TABLET | Freq: Every day | ORAL | 3 refills | Status: DC

## 2021-09-10 NOTE — Discharge Summary (Signed)
?Physician Discharge Summary ?  ?Patient: Austin [name] MRN: [id number] DOB: 1965/08/21  ?Admit date:     09/07/2021  ?Discharge date: 09/10/21  ?Discharge Physician: Oswald Hillock  ? ?PCP: System, Provider Not In  ? ?Recommendations at discharge:  ? ?Follow-up PCP in 1 week ? ?Discharge Diagnoses: ?Principal Problem: ?  Alcohol withdrawal with inpatient treatment [facility]) ?Active Problems: ?  Elevated LFTs ?  Thrombocytopenia ([place]) ?  Passive suicidal ideations ? ?Resolved Problems: ?  * No resolved hospital problems. * ? ?Hospital Course: ?Austin [name] is a 56 y.o. male with medical history significant for congenital blindness, alcohol use disorder with history of alcohol associated pancreatitis who is admitted with alcohol withdrawal. ? ?Assessment and Plan: ?* Alcohol withdrawal with inpatient treatment [facility]) ?Patient with chronic daily alcohol use, tried to stop cold [place] about 1 week prior to admission.  CIWA score was 16 on arrival.  He also has been reporting apparent auditory hallucinations. ?-Patient was started on CIWA protocol with Ativan ?-He is no longer in alcohol withdrawal ?-Librium has been tapered off ?-Continue thiamine, folate ?-We will give him information for outpatient substance abuse rehab center ? ? ?Elevated LFTs ?Mildly elevated AST 148, ALT 86 likely secondary to alcohol use.  ?-LFTs improving, AST 77, ALT 69 ? ? ?Thrombocytopenia ([place]) ?Mild thrombocytopenia with platelets 100,000, likely related to alcohol use.  No obvious bleeding.   ? ?Passive suicidal ideations ?Patient reports recent thoughts of harming self without active plan. ?-Safety/suicide precautions ?-Psych consulted, patient does not meet criteria for inpatient psych ?-Started on Lexapro 5 mg daily ?-We will discontinue Cymbalta ? ? ? ? ?  ? ? ?Consultants: Psychiatry ?Procedures performed:  ?Disposition: Home ?Diet recommendation:  ?Discharge Diet Orders (From admission, onward)  ? ?  Start     Ordered  ? 09/10/21 0000  Diet  - low sodium heart healthy       ? 09/10/21 1321  ? ?  ?  ? ?  ? ?Regular diet ?DISCHARGE MEDICATION: ?Allergies as of 09/10/2021   ? ?   Reactions  ? Chlorhexidine Rash  ? Foley D/Ced this AM 01/08/2020  ? ?  ? ?  ?Medication List  ?  ? ?STOP taking these medications   ? ?DULoxetine 30 MG capsule ?Commonly known as: CYMBALTA ?  ?ibuprofen 200 MG tablet ?Commonly known as: ADVIL ?  ?Naprosyn 500 MG tablet ?Generic drug: naproxen ?  ?predniSONE 10 MG tablet ?Commonly known as: DELTASONE ?  ? ?  ? ?TAKE these medications   ? ?acetaminophen 500 MG tablet ?Commonly known as: TYLENOL ?Take 1,000 mg by mouth every 6 (six) hours as needed for headache (pain). ?  ?busPIRone 7.5 MG tablet ?Commonly known as: BUSPAR ?Take 7.5 mg by mouth 2 (two) times daily. ?  ?cyclobenzaprine 5 MG tablet ?Commonly known as: FLEXERIL ?Take 5 mg by mouth at bedtime as needed for muscle spasms. ?  ?diclofenac Sodium 1 % Gel ?Commonly known as: VOLTAREN ?SMARTSIG:4 Gram(s) Topical 4 Times Daily PRN ?  ?escitalopram 5 MG tablet ?Commonly known as: LEXAPRO ?Take 1 tablet (5 mg total) by mouth at bedtime. ?  ?folic acid 1 MG tablet ?Commonly known as: FOLVITE ?Take 1 tablet (1 mg total) by mouth daily. ?Start taking on: September 11, 2021 ?  ?hydrOXYzine 25 MG tablet ?Commonly known as: ATARAX ?Take 50 mg by mouth at bedtime. ?  ?ketorolac 10 MG tablet ?Commonly known as: TORADOL ?Take 10 mg by mouth 3 (three) times  daily as needed for moderate pain or severe pain. ?  ?loperamide 2 MG tablet ?Commonly known as: IMODIUM A-D ?Take 2 mg by mouth See admin instructions. Take one tablet (2 mg) by mouth every morning, may take one tablet (2 mg) in the afternoon as needed for diarrhea ?  ?Magnesium 400 MG Tabs ?Take 400 mg by mouth daily. ?  ?Multivitamin Adults Tabs ?Take 1 tablet by mouth daily. ?  ?ondansetron 4 MG tablet ?Commonly known as: ZOFRAN ?Take 1 tablet (4 mg total) by mouth every 6 (six) hours. ?What changed:  ?when to take this ?reasons to take  this ?  ?ondansetron 8 MG disintegrating tablet ?Commonly known as: ZOFRAN-ODT ?Take 8 mg by mouth 2 (two) times daily as needed for nausea or vomiting. ?  ?thiamine 100 MG tablet ?Take 1 tablet (100 mg total) by mouth daily. ?Start taking on: September 11, 2021 ?  ? ?  ? ? ?Discharge Exam: ?Danley Danker Weights  ? 09/07/21 1525  ?Weight: 61.2 kg  ? ?General-appears in no acute distress ?Heart-S1-S2, regular, no murmur auscultated ?Lungs-clear to auscultation bilaterally, no wheezing or crackles auscultated ?Abdomen-soft, nontender, no organomegaly ?Extremities-no edema in the lower extremities ?Neuro-alert, oriented x3, no focal deficit noted ? ?Condition at discharge: good ? ?The results of significant diagnostics from this hospitalization (including imaging, microbiology, ancillary and laboratory) are listed below for reference.  ? ?Imaging Studies: ?No results found. ? ?Microbiology: ?Results for orders placed or performed during the hospital encounter of 08/22/20  ?SARS CORONAVIRUS 2 (TAT 6-24 HRS) Nasopharyngeal Nasopharyngeal Swab     Status: None  ? Collection Time: 08/22/20  2:55 PM  ? Specimen: Nasopharyngeal Swab  ?Result Value Ref Range Status  ? SARS Coronavirus 2 NEGATIVE NEGATIVE Final  ?  Comment: (NOTE) ?SARS-CoV-2 target nucleic acids are NOT DETECTED. ? ?The SARS-CoV-2 RNA is generally detectable in upper and lower ?respiratory specimens during the acute phase of infection. Negative ?results do not preclude SARS-CoV-2 infection, do not rule out ?co-infections with other pathogens, and should not be used as the ?sole basis for treatment or other patient management decisions. ?Negative results must be combined with clinical observations, ?patient history, and epidemiological information. The expected ?result is Negative. ? ?Fact Sheet for Patients: ?SugarRoll.be ? ?Fact Sheet for Healthcare Providers: ?https://www.woods-mathews.com/ ? ?This test is not yet approved or  cleared by the [place] FDA and  ?has been authorized for detection and/or diagnosis of SARS-CoV-2 by ?FDA under an Emergency Use Authorization (EUA). This EUA will remain  ?in effect (meaning this test can be used) for the duration of the ?COVID-19 declaration under Se ction 564(b)(1) of the Act, 21 U.S.C. ?section 360bbb-3(b)(1), unless the authorization is terminated or ?revoked sooner. ? ?Performed at [facility], [place] [address], [place], [place] ?[address] ?  ? ? ?Labs: ?CBC: ?Recent Labs  ?Lab 09/07/21 ?1559 09/08/21 ?0327 09/09/21 ?0327  ?WBC 5.2 5.4 3.9*  ?NEUTROABS 3.6  --   --   ?HGB 15.2 13.1 12.8*  ?HCT 43.9 37.0* 37.1*  ?MCV 90.7 92.5 92.3  ?PLT 102* 89* 100*  ? ?Basic Metabolic Panel: ?Recent Labs  ?Lab 09/07/21 ?1559 09/08/21 ?0327 09/09/21 ?0327  ?NA 135 139 136  ?K 4.2 3.8 3.6  ?CL 97* 107 104  ?CO2 '26 27 27  '$ ?GLUCOSE 125* 99 101*  ?BUN '17 15 13  '$ ?CREATININE 1.18 1.02 1.03  ?CALCIUM 10.0 9.0 8.6*  ?MG  --  1.9  --   ? ?Liver Function Tests: ?Recent  Labs  ?Lab 09/07/21 ?1559 09/08/21 ?0327 09/09/21 ?0327  ?AST 148* 98* 77*  ?ALT 86* 69* 69*  ?ALKPHOS 113 84 73  ?BILITOT 0.8 0.7 0.7  ?PROT 7.7 6.0* 5.7*  ?ALBUMIN 4.5 3.3* 3.1*  ? ?CBG: ?No results for input(s): GLUCAP in the last 168 hours. ? ?Discharge time spent: greater than 30 minutes. ? ?Signed: ?Oswald Hillock, MD ?[facility] ?09/10/2021 ?

## 2021-09-10 NOTE — TOC Progression Note (Signed)
Transition of Care (TOC) - Progression Note  ? ? ?Patient Details  ?Name: Austin [name] ?MRN: [id number] ?Date of Birth: [birth year] ? ?Transition of Care (TOC) CM/SW Contact  ?[name], RN ?Phone Number: ?09/10/2021, 1:45 PM ? ?Clinical Narrative:    ?Spoke with pt concerning substance abuse OP clinic. Pt states that he has an appointment with substance abuse clinic [place] town and will keep his appointment. [name] was selected for RN for medication teaching.  ? ? ?  ?  ? ?Expected Discharge Plan and Services ?  ?  ?  ?  ?  ?Expected Discharge Date: 09/10/21               ?  ?  ?  ?  ?  ?  ?  ?  ?  ?  ? ? ?Social Determinants of Health (SDOH) Interventions ?  ? ?Readmission Risk Interventions ?   ? View : No data to display.  ?  ?  ?  ? ? ?

## 2022-01-12 ENCOUNTER — Other Ambulatory Visit: Payer: Self-pay

## 2022-01-12 ENCOUNTER — Emergency Department (HOSPITAL_COMMUNITY): Payer: Medicare HMO

## 2022-01-12 ENCOUNTER — Encounter (HOSPITAL_COMMUNITY): Payer: Self-pay

## 2022-01-12 ENCOUNTER — Emergency Department (HOSPITAL_COMMUNITY)
Admission: EM | Admit: 2022-01-12 | Discharge: 2022-01-12 | Disposition: A | Discharge status: Home / Self Care | Payer: Medicare HMO | Attending: Emergency Medicine | Admitting: Emergency Medicine

## 2022-01-12 DIAGNOSIS — R112 Nausea with vomiting, unspecified: Secondary | ICD-10-CM | POA: Diagnosis not present

## 2022-01-12 DIAGNOSIS — I10 Essential (primary) hypertension: Secondary | ICD-10-CM | POA: Insufficient documentation

## 2022-01-12 DIAGNOSIS — E876 Hypokalemia: Secondary | ICD-10-CM

## 2022-01-12 DIAGNOSIS — R1011 Right upper quadrant pain: Secondary | ICD-10-CM | POA: Diagnosis present

## 2022-01-12 DIAGNOSIS — R509 Fever, unspecified: Secondary | ICD-10-CM | POA: Insufficient documentation

## 2022-01-12 DIAGNOSIS — R1013 Epigastric pain: Secondary | ICD-10-CM | POA: Insufficient documentation

## 2022-01-12 DIAGNOSIS — R519 Headache, unspecified: Secondary | ICD-10-CM | POA: Diagnosis not present

## 2022-01-12 DIAGNOSIS — K860 Alcohol-induced chronic pancreatitis: Secondary | ICD-10-CM

## 2022-01-12 DIAGNOSIS — D696 Thrombocytopenia, unspecified: Secondary | ICD-10-CM

## 2022-01-12 DIAGNOSIS — Z789 Other specified health status: Secondary | ICD-10-CM

## 2022-01-12 DIAGNOSIS — R7989 Other specified abnormal findings of blood chemistry: Secondary | ICD-10-CM

## 2022-01-12 LAB — COMPREHENSIVE METABOLIC PANEL
ALT: 32 U/L (ref 0–44)
AST: 66 U/L — ABNORMAL HIGH (ref 15–41)
Albumin: 3.6 g/dL (ref 3.5–5.0)
Alkaline Phosphatase: 71 U/L (ref 38–126)
Anion gap: 7 (ref 5–15)
BUN: 5 mg/dL — ABNORMAL LOW (ref 6–20)
CO2: 25 mmol/L (ref 22–32)
Calcium: 8.3 mg/dL — ABNORMAL LOW (ref 8.9–10.3)
Chloride: 104 mmol/L (ref 98–111)
Creatinine, Ser: 0.99 mg/dL (ref 0.61–1.24)
GFR, Estimated: >60 mL/min (ref >60)
Glucose, Bld: 96 mg/dL (ref 70–99)
Potassium: 3.2 mmol/L — ABNORMAL LOW (ref 3.5–5.1)
Sodium: 136 mmol/L (ref 135–145)
Total Bilirubin: 1 mg/dL (ref 0.3–1.2)
Total Protein: 6.4 g/dL — ABNORMAL LOW (ref 6.5–8.1)

## 2022-01-12 LAB — URINALYSIS, ROUTINE W REFLEX MICROSCOPIC
Bacteria, UA: NONE SEEN
Bilirubin Urine: NEGATIVE
Glucose, UA: NEGATIVE mg/dL
Ketones, ur: 5 mg/dL — AB
Leukocytes,Ua: NEGATIVE
Nitrite: NEGATIVE
Protein, ur: NEGATIVE mg/dL
Specific Gravity, Urine: 1.02 (ref 1.005–1.030)
pH: 7 (ref 5.0–8.0)

## 2022-01-12 LAB — CBC WITH DIFFERENTIAL/PLATELET
Abs Immature Granulocytes: 0 10*3/uL (ref 0.00–0.07)
Basophils Absolute: 0 10*3/uL (ref 0.0–0.1)
Basophils Relative: 1 %
Eosinophils Absolute: 0 10*3/uL (ref 0.0–0.5)
Eosinophils Relative: 1 %
HCT: 43.3 % (ref 39.0–52.0)
Hemoglobin: 15 g/dL (ref 13.0–17.0)
Immature Granulocytes: 0 %
Lymphocytes Relative: 47 %
Lymphs Abs: 1.4 10*3/uL (ref 0.7–4.0)
MCH: 31.4 pg (ref 26.0–34.0)
MCHC: 34.6 g/dL (ref 30.0–36.0)
MCV: 90.8 fL (ref 80.0–100.0)
Monocytes Absolute: 0.4 10*3/uL (ref 0.1–1.0)
Monocytes Relative: 11 %
Neutro Abs: 1.2 10*3/uL — ABNORMAL LOW (ref 1.7–7.7)
Neutrophils Relative %: 40 %
Platelets: 107 10*3/uL — ABNORMAL LOW (ref 150–400)
RBC: 4.77 MIL/uL (ref 4.22–5.81)
RDW: 12.7 % (ref 11.5–15.5)
WBC: 3.1 10*3/uL — ABNORMAL LOW (ref 4.0–10.5)
nRBC: 0 % (ref 0.0–0.2)

## 2022-01-12 LAB — PROTIME-INR
INR: 1 (ref 0.8–1.2)
Prothrombin Time: 12.6 seconds (ref 11.4–15.2)

## 2022-01-12 LAB — LIPASE, BLOOD: Lipase: 25 U/L (ref 11–51)

## 2022-01-12 LAB — MAGNESIUM: Magnesium: 1.5 mg/dL — ABNORMAL LOW (ref 1.7–2.4)

## 2022-01-12 MED ORDER — POTASSIUM CHLORIDE 10 MEQ/100ML IV SOLN
10.0000 meq | Freq: Once | INTRAVENOUS | Status: AC
  Administered 2022-01-12: 10 meq via INTRAVENOUS
  Filled 2022-01-12: qty 100

## 2022-01-12 MED ORDER — FENTANYL CITRATE PF 50 MCG/ML IJ SOSY
50.0000 ug | PREFILLED_SYRINGE | Freq: Once | INTRAMUSCULAR | Status: AC
  Administered 2022-01-12: 50 ug via INTRAVENOUS
  Filled 2022-01-12: qty 1

## 2022-01-12 MED ORDER — POTASSIUM CHLORIDE CRYS ER 20 MEQ PO TBCR: 20.0000 meq | EXTENDED_RELEASE_TABLET | Freq: Two times a day (BID) | ORAL | 0 refills | Status: DC

## 2022-01-12 MED ORDER — ONDANSETRON HCL 4 MG/2ML IJ SOLN
4.0000 mg | Freq: Once | INTRAMUSCULAR | Status: AC
  Administered 2022-01-12: 4 mg via INTRAVENOUS
  Filled 2022-01-12: qty 2

## 2022-01-12 MED ORDER — SODIUM CHLORIDE 0.9 % IV BOLUS
1000.0000 mL | Freq: Once | INTRAVENOUS | Status: AC
  Administered 2022-01-12: 1000 mL via INTRAVENOUS

## 2022-01-12 MED ORDER — SODIUM CHLORIDE 0.9 % IV BOLUS: 1000.0000 mL | Freq: Once | INTRAVENOUS | Status: DC

## 2022-01-12 MED ORDER — MAGNESIUM SULFATE 2 GM/50ML IV SOLN
2.0000 g | Freq: Once | INTRAVENOUS | Status: AC
  Administered 2022-01-12: 2 g via INTRAVENOUS
  Filled 2022-01-12: qty 50

## 2022-01-12 MED ORDER — IOHEXOL 300 MG/ML  SOLN
100.0000 mL | Freq: Once | INTRAMUSCULAR | Status: AC | PRN
Start: 2022-01-12 — End: 2022-01-12
  Administered 2022-01-12: 100 mL via INTRAVENOUS

## 2022-01-12 MED ORDER — HYDROCODONE-ACETAMINOPHEN 5-325 MG PO TABS
1.0000 | ORAL_TABLET | Freq: Once | ORAL | Status: AC
  Administered 2022-01-12: 1 via ORAL
  Filled 2022-01-12: qty 1

## 2022-01-12 MED ORDER — SODIUM CHLORIDE (PF) 0.9 % IJ SOLN
INTRAMUSCULAR | Status: AC
  Filled 2022-01-12: qty 50

## 2022-01-12 NOTE — ED Provider Notes (Signed)
Patient care taken over at shift handoff from [facility], PA-C.    In short, 56 year old male with history of recurrent alcoholic pancreatitis, thrombocytopenia, elevated LFTs, GERD, and essential hypertension presenting with right upper abdominal epigastric pain.  Patient felt fatigued with nausea and vomiting over the past week.  He has had increasing abdominal pain and worsening headache.  The symptoms typically accompany his episodes of pancreatitis.  Over the past 3-4 nights he has has light chills and fevers.  Patient denies constipation, diarrhea, shortness of breath, chest pain, neck stiffness, leg swelling.  The patient last alcohol this morning.  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   Physical Exam  BP (!) 154/99   Pulse 69   Temp 99.8 F (37.7 C) (Oral)   Resp 16   Ht '5\' 7"'$  (1.702 m)   Wt 61 kg   SpO2 96%   BMI 21.06 kg/m   Physical Exam Vitals and nursing note reviewed.  Constitutional:      General: He is not in acute distress. Neurological:     Mental Status: He is alert.     Procedures  Procedures  ED Course / MDM    Medical Decision Making Amount and/or Complexity of Data Reviewed Labs: ordered. Radiology: ordered.  Risk Prescription drug management.   I reevaluated the patient after the patient had magnesium and potassium repleted.  I ordered him hydrocodone for pain.  Upon reassessment he had improved.  I reviewed and interpreted CT abdomen.  No acute findings were found.  I agree with the radiologist findings.  This does not appear to be a surgical abdomen.  This seems like a likely flare of his acute on chronic pancreatitis.  Plan on discharge home with outpatient p.o. potassium and close PCP follow-up.  The patient is in agreement with the plan.  Discharge       Ronny Bacon 01/12/22 1650    Long, Wonda Olds, MD 01/13/22 2250

## 2022-01-12 NOTE — Discharge Instructions (Addendum)
Your imaging today was negative for acute pancreatic changes, such as pancreatitis.  Potassium by the name of Klor-Con has been sent to your pharmacy.  Take 1 tablet every 12 hours for the next 3 days to replenish her potassium.  Take with plenty of food and water.  Low potassium puts you at risk for issues such as cardiac arrhythmias.  Follow-up with your PCP within the next 2 to 3 days for reevaluation and continued medical management.  Return to the ED for new or worsening symptoms as discussed.

## 2022-01-12 NOTE — ED Triage Notes (Signed)
Patient BIBA from urgent care. Patient c/o lower right abdominal pain, migraine, and fatigue for about a week.Patient states he has had some Nausea and vomiting for the past week Patient states he started feeling feverish on Friday. Patient states he was diagnosis with pancreatitis years ago.  Vitals were: 164/108 73-HR 100% on room air

## 2022-01-12 NOTE — ED Provider Notes (Signed)
[facility] DEPT Provider Note   CSN: [id number] Arrival date & time: 01/12/22  1305     History  Chief Complaint  Patient presents with   Abdominal Pain   Nausea   Migraine    Austin [name] is a 56 y.o. male with a history of recurrent alcoholic pancreatitis, thrombocytopenia, elevated LFTs, GERD, and essential HTN.  Presenting today with right upper abdominal and epigastric pain.  Felt fatigued with nausea and vomiting over the past week.  He did have increasing abdominal pain and worsening headache, all of her symptoms usually accompany his episodes of pancreatitis.  Began having light chills/fevers over the last 3-4 nights.  Constipation, diarrhea, shortness of breath, chest pain, neck stiffness, or leg swelling.  Last alcoholic beverage was this morning, a few hours ago.  Has blindness of both eyes at baseline.  The history is provided by the patient and medical records.  Abdominal Pain Migraine Associated symptoms include abdominal pain.       Home Medications Prior to Admission medications   Medication Sig Start Date End Date Taking? Authorizing Provider  acetaminophen (TYLENOL) 500 MG tablet Take 1,000 mg by mouth every 6 (six) hours as needed for headache (pain).    [provider]  busPIRone (BUSPAR) 7.5 MG tablet Take 7.5 mg by mouth 2 (two) times daily. 07/31/21   [provider]  cyclobenzaprine (FLEXERIL) 5 MG tablet Take 5 mg by mouth at bedtime as needed for muscle spasms. 07/27/21   [provider]  diclofenac Sodium (VOLTAREN) 1 % GEL SMARTSIG:4 Gram(s) Topical 4 Times Daily PRN 08/10/21   [provider]  escitalopram (LEXAPRO) 5 MG tablet Take 1 tablet (5 mg total) by mouth at bedtime. 09/10/21   Oswald Hillock, MD  folic acid (FOLVITE) 1 MG tablet Take 1 tablet (1 mg total) by mouth daily. 09/11/21   Oswald Hillock, MD  hydrOXYzine (ATARAX) 25 MG tablet Take 50 mg by mouth at bedtime. 09/05/21   [provider]  ketorolac (TORADOL) 10 MG tablet Take 10 mg by mouth 3 (three) times daily as needed for moderate pain or severe pain. 09/05/21   [provider]  loperamide (IMODIUM A-D) 2 MG tablet Take 2 mg by mouth See admin instructions. Take one tablet (2 mg) by mouth every morning, may take one tablet (2 mg) in the afternoon as needed for diarrhea 07/04/20   [provider]  Magnesium 400 MG TABS Take 400 mg by mouth daily.    [provider]  Multiple Vitamins-Minerals (MULTIVITAMIN ADULTS) TABS Take 1 tablet by mouth daily.    [provider]  ondansetron (ZOFRAN) 4 MG tablet Take 1 tablet (4 mg total) by mouth every 6 (six) hours. Patient taking differently: Take 4 mg by mouth daily as needed for nausea or vomiting. 02/17/21   Meccariello, Bernita Raisin, DO  ondansetron (ZOFRAN-ODT) 8 MG disintegrating tablet Take 8 mg by mouth 2 (two) times daily as needed for nausea or vomiting.    [provider]  thiamine 100 MG tablet Take 1 tablet (100 mg total) by mouth daily. 09/11/21   Oswald Hillock, MD      Allergies    Chlorhexidine    Review of Systems   Review of Systems  Gastrointestinal:  Positive for abdominal pain.    Physical Exam Updated Vital Signs BP (!) 157/101   Pulse 76   Temp 99.8 F (37.7 C) (Oral)   Resp 14  Ht '5\' 7"'$  (1.702 m)   Wt 61 kg   SpO2 100%   BMI 21.06 kg/m  Physical Exam Vitals and nursing note reviewed.  Constitutional:      General: He is not in acute distress.    Appearance: He is well-developed. He is not ill-appearing, toxic-appearing or diaphoretic.  HENT:     Head: Normocephalic and atraumatic.  Eyes:     Conjunctiva/sclera: Conjunctivae normal.     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  Left eye with blindness, no evidence of abnormality.  Unable to assess EOMs or pupils  Cardiovascular:     Rate and Rhythm: Normal rate and regular rhythm.     Heart sounds: No murmur  heard. Pulmonary:     Effort: Pulmonary effort is normal. No respiratory distress.     Breath sounds: Normal breath sounds.  Chest:     Chest wall: No tenderness.  Abdominal:     General: Abdomen is protuberant. There is no distension.     Palpations: Abdomen is soft.     Tenderness: There is abdominal tenderness in the right upper quadrant and epigastric area. There is no right CVA tenderness, left CVA tenderness or guarding. Positive signs include Murphy's sign.  Musculoskeletal:        General: No swelling.     Cervical back: Neck supple.  Skin:    General: Skin is warm and dry.     Capillary Refill: Capillary refill takes less than 2 seconds.     Coloration: Skin is not cyanotic, jaundiced or pale.     Findings: No rash.  Neurological:     Mental Status: He is alert and oriented to person, place, and time.  Psychiatric:        Mood and Affect: Mood normal.     ED Results / Procedures / Treatments   Labs (all labs ordered are listed, but only abnormal results are displayed) Labs Reviewed  CBC WITH DIFFERENTIAL/PLATELET - Abnormal; Notable for the following components:      Result Value   WBC 3.1 (*)    Platelets 107 (*)    Neutro Abs 1.2 (*)    All other components within normal limits  COMPREHENSIVE METABOLIC PANEL - Abnormal; Notable for the following components:   Potassium 3.2 (*)    BUN 5 (*)    Calcium 8.3 (*)    Total Protein 6.4 (*)    AST 66 (*)    All other components within normal limits  MAGNESIUM - Abnormal; Notable for the following components:   Magnesium 1.5 (*)    All other components within normal limits  LIPASE, BLOOD  PROTIME-INR  URINALYSIS, ROUTINE W REFLEX MICROSCOPIC    EKG None  Radiology No results found.  Procedures Procedures    Medications Ordered in ED Medications  potassium chloride 10 mEq in 100 mL IVPB (10 mEq Intravenous New Bag/Given 01/12/22 1423)  magnesium sulfate IVPB 2 g 50 mL (2 g Intravenous New Bag/Given 01/12/22  1423)  ondansetron (ZOFRAN) injection 4 mg (4 mg Intravenous Given 01/12/22 1406)  sodium chloride 0.9 % bolus 1,000 mL (1,000 mLs Intravenous New Bag/Given 01/12/22 1408)  fentaNYL (SUBLIMAZE) injection 50 mcg (50 mcg Intravenous Given 01/12/22 1442)  iohexol (OMNIPAQUE) 300 MG/ML solution 100 mL (100 mLs Intravenous Contrast Given 01/12/22 1452)  sodium chloride (PF) 0.9 % injection (  Given by Other 01/12/22 1505)    ED Course/ Medical Decision Making/ A&P  Medical Decision Making Amount and/or Complexity of Data Reviewed Labs: ordered. Radiology: ordered.  Risk Prescription drug management.   56 y.o. male presents to the ED for concern of Abdominal Pain, Nausea, and Migraine     This involves an extensive number of treatment options, and is a complaint that carries with it a high risk of complications and morbidity.  The emergent differential diagnosis prior to evaluation includes, but is not limited to: Appendicitis, Bowel Obstruction, AAA, Pancreatitis, Cholecystitis, Perforated Bowel or Ulcer, Diverticulosis/itis, Ischemic Mesentery, Inflammatory Bowel Disease, Strangulated/Incarcerated Hernia, Gastroenteritis   This is not an exhaustive differential.   Past Medical History / Co-morbidities / Social History: Hx of recurrent alcoholic pancreatitis, thrombocytopenia, elevated LFTs, GERD, and essential HTN Social Determinants of Health include severe alcohol abuse, for which cessation counseling was provided  Additional History:  Internal and external records from outside source obtained and reviewed including ED visits, which indicate several recent visits for alcohol withdrawal and alcoholic pancreatitis over the last few years.  Lab Tests: I ordered, and personally interpreted labs.  The pertinent results include:   CBC: WBC 3.1, steadily declining over the last 1 to 2 years.  PLTs 107, at baseline.  Neutrophil count 1.2, decreased from 3.4 of 4 months  ago CMP/BMP: Potassium 3.2, BUN 5, calcium 8.3, total protein 6.4, AST 66 Lipase: 25 PT-INR: 12.6, 1.0 UA: Pending Magnesium: 1.5  Imaging Studies: I ordered imaging studies including CT abdomen and pelvis.  Pending  Cardiac Monitoring: The patient was maintained on a cardiac monitor.  I personally viewed and interpreted the cardiac monitored which showed an underlying rhythm of: Normal sinus rhythm  ED Course / Critical Interventions: Pt well-appearing on exam.  Presenting with epigastric and right upper quadrant abdominal pain.  Accompanied with N/V and migraines.  Recent development of fevers since Friday night.  Afebrile.  Review of symptomology usually presents at beginning of acute on chronic pancreatitis, per patient and records.  Migraine consistent with past, no new features or characteristics.  Patient is nontoxic, nonseptic appearing, in no apparent distress.  Without urinary symptoms or changes in bowel habits.  Patient's pain and other symptoms adequately managed in ED.  Fluid bolus, IV potassium, IV magnesium, zofran, and fentanyl given.  Labs, history and vitals reviewed.  PT-INR not indicative of acute liver failure.  Patient does not meet the SIRS or Sepsis criteria.  Plan for CT abdomen/pelvis, potassium and magnesium replenishment, and reassess.  May add benzo if pt develops symptoms of withdrawal. On repeat exam patient does not have a surgical abdomen and there are no peritoneal signs.  Mild-moderate improvement appreciated.  Imaging still pending.  Disposition: 1510: care of Lekendrick R Aceituno transferred to [place] at the end of my shift.  Patient case discussed at length.  Please see his/her note for further details.  Plan at time of handoff is dependent on CT imaging.  If imaging negative and patient able to pass PO challenge, plan to finish IV potassium and magnesium supplementation followed by short course of outpatient PO potassium and close PCP follow-up.  If  concerning findings on CT, may consider consultation with Surgery/GI.  This may be altered or completely changed at the discretion of the oncoming team pending results of further workup.  I discussed this case with my attending, Dr. Jeanell Sparrow, who agreed with the proposed treatment course and cosigned this note including patient's presenting symptoms, physical exam, and interventions.  Attending physician stated agreement with plan or made changes to  plan which were implemented.     This chart was dictated using voice recognition software.  Despite best efforts to proofread, errors can occur which can change the documentation meaning.         Final Clinical Impression(s) / ED Diagnoses Final diagnoses:  None    Rx / DC Orders ED Discharge Orders     None         Candace Cruise 29/29/09 1539    Pattricia Boss, MD 01/15/22 [phone]

## 2022-02-12 ENCOUNTER — Ambulatory Visit: Payer: Medicare HMO | Admitting: Podiatry

## 2022-02-26 ENCOUNTER — Ambulatory Visit: Payer: Medicare HMO | Admitting: Podiatry

## 2022-03-03 ENCOUNTER — Encounter (HOSPITAL_COMMUNITY): Payer: Self-pay

## 2022-03-03 ENCOUNTER — Other Ambulatory Visit: Payer: Self-pay

## 2022-03-03 ENCOUNTER — Emergency Department (HOSPITAL_COMMUNITY)
Admission: EM | Admit: 2022-03-03 | Discharge: 2022-03-04 | Disposition: A | Discharge status: Home / Self Care | Payer: Medicare HMO | Attending: Emergency Medicine | Admitting: Emergency Medicine

## 2022-03-03 DIAGNOSIS — Y908 Blood alcohol level of 240 mg/100 ml or more: Secondary | ICD-10-CM | POA: Insufficient documentation

## 2022-03-03 DIAGNOSIS — R1084 Generalized abdominal pain: Secondary | ICD-10-CM | POA: Insufficient documentation

## 2022-03-03 DIAGNOSIS — F10129 Alcohol abuse with intoxication, unspecified: Secondary | ICD-10-CM | POA: Diagnosis not present

## 2022-03-03 DIAGNOSIS — R197 Diarrhea, unspecified: Secondary | ICD-10-CM | POA: Insufficient documentation

## 2022-03-03 DIAGNOSIS — R7401 Elevation of levels of liver transaminase levels: Secondary | ICD-10-CM | POA: Insufficient documentation

## 2022-03-03 DIAGNOSIS — Z20822 Contact with and (suspected) exposure to covid-19: Secondary | ICD-10-CM | POA: Diagnosis not present

## 2022-03-03 DIAGNOSIS — R112 Nausea with vomiting, unspecified: Secondary | ICD-10-CM | POA: Diagnosis not present

## 2022-03-03 DIAGNOSIS — Z79899 Other long term (current) drug therapy: Secondary | ICD-10-CM | POA: Insufficient documentation

## 2022-03-03 DIAGNOSIS — F10229 Alcohol dependence with intoxication, unspecified: Secondary | ICD-10-CM

## 2022-03-03 LAB — COMPREHENSIVE METABOLIC PANEL
ALT: 36 U/L (ref 0–44)
AST: 47 U/L — ABNORMAL HIGH (ref 15–41)
Albumin: 3.8 g/dL (ref 3.5–5.0)
Alkaline Phosphatase: 81 U/L (ref 38–126)
Anion gap: 8 (ref 5–15)
BUN: 11 mg/dL (ref 6–20)
CO2: 26 mmol/L (ref 22–32)
Calcium: 8.6 mg/dL — ABNORMAL LOW (ref 8.9–10.3)
Chloride: 103 mmol/L (ref 98–111)
Creatinine, Ser: 0.88 mg/dL (ref 0.61–1.24)
GFR, Estimated: >60 mL/min (ref >60)
Glucose, Bld: 99 mg/dL (ref 70–99)
Potassium: 4.1 mmol/L (ref 3.5–5.1)
Sodium: 137 mmol/L (ref 135–145)
Total Bilirubin: 0.8 mg/dL (ref 0.3–1.2)
Total Protein: 7.1 g/dL (ref 6.5–8.1)

## 2022-03-03 LAB — CBC
HCT: 40 % (ref 39.0–52.0)
Hemoglobin: 14 g/dL (ref 13.0–17.0)
MCH: 31.9 pg (ref 26.0–34.0)
MCHC: 35 g/dL (ref 30.0–36.0)
MCV: 91.1 fL (ref 80.0–100.0)
Platelets: 152 10*3/uL (ref 150–400)
RBC: 4.39 MIL/uL (ref 4.22–5.81)
RDW: 13.2 % (ref 11.5–15.5)
WBC: 4.8 10*3/uL (ref 4.0–10.5)
nRBC: 0 % (ref 0.0–0.2)

## 2022-03-03 LAB — ACETAMINOPHEN LEVEL: Acetaminophen (Tylenol), Serum: <10 ug/mL — ABNORMAL LOW (ref 10–30)

## 2022-03-03 LAB — SALICYLATE LEVEL: Salicylate Lvl: <7 mg/dL — ABNORMAL LOW (ref 7.0–30.0)

## 2022-03-03 LAB — ETHANOL: Alcohol, Ethyl (B): 280 mg/dL — ABNORMAL HIGH (ref <10)

## 2022-03-03 LAB — LIPASE, BLOOD: Lipase: 31 U/L (ref 11–51)

## 2022-03-03 NOTE — ED Triage Notes (Signed)
Patient was brought here by [employer]. Patient states he wants to go home also states "I want to die". Patient admits to drinking alcohol (vodka). Patient reports abdominal pain.

## 2022-03-03 NOTE — ED Provider Triage Note (Signed)
Emergency Medicine Provider Triage Evaluation Note  Austin [name] , a 56 y.o. male  was evaluated in triage.  Pt complains of suicidal thoughts and alcohol intoxication.  Patient reports drinking 1/5 of vodka prior to arrival.  He reports that he wants to die and does not want to live anymore.  Endorses plan to overdose on his sleeping pills and is asking to go home.  Patient also reports some abdominal pain that started after drinking that feels like his chronic pancreatitis.  Review of Systems  Positive: Suicidal ideations, abdominal pain Negative: Chest pain, shortness of breath, headache, homicidal thoughts  Physical Exam  BP 117/72   Pulse 66   Temp 97.7 F (36.5 C) (Oral)   Resp 16   Ht '5\' 7"'$  (1.702 m)   Wt 63.5 kg   SpO2 97%   BMI 21.93 kg/m  Gen:   Awake, no distress   Resp:  Normal effort  MSK:   Moves extremities without difficulty  Other:  Mild epigastric tenderness, no guarding, no other abdominal tenderness  Medical Decision Making  Medically screening exam initiated at 10:54 PM.  Appropriate orders placed.  Austin [name] was informed that the remainder of the evaluation will be completed by another provider, this initial triage assessment does not replace that evaluation, and the importance of remaining in the ED until their evaluation is complete.     Jacqlyn Larsen, [place] 03/03/22 2256

## 2022-03-03 NOTE — ED Notes (Signed)
Pt changed into burgundy scrubs. Pt belongings placed in 2x pt belonging bags in hall B cabinet behind nurse station

## 2022-03-04 DIAGNOSIS — F10129 Alcohol abuse with intoxication, unspecified: Secondary | ICD-10-CM | POA: Diagnosis not present

## 2022-03-04 DIAGNOSIS — F10229 Alcohol dependence with intoxication, unspecified: Secondary | ICD-10-CM

## 2022-03-04 LAB — RAPID URINE DRUG SCREEN, HOSP PERFORMED
Amphetamines: NOT DETECTED
Barbiturates: NOT DETECTED
Benzodiazepines: NOT DETECTED
Cocaine: NOT DETECTED
Opiates: NOT DETECTED
Tetrahydrocannabinol: NOT DETECTED

## 2022-03-04 LAB — SARS CORONAVIRUS 2 BY RT PCR: SARS Coronavirus 2 by RT PCR: NEGATIVE

## 2022-03-04 MED ORDER — ALUM & MAG HYDROXIDE-SIMETH 200-200-20 MG/5ML PO SUSP
30.0000 mL | Freq: Once | ORAL | Status: AC
  Administered 2022-03-04: 30 mL via ORAL
  Filled 2022-03-04: qty 30

## 2022-03-04 MED ORDER — HYDROCODONE-ACETAMINOPHEN 5-325 MG PO TABS
1.0000 | ORAL_TABLET | Freq: Once | ORAL | Status: AC
  Administered 2022-03-04: 1 via ORAL
  Filled 2022-03-04: qty 1

## 2022-03-04 MED ORDER — ONDANSETRON HCL 4 MG/2ML IJ SOLN
4.0000 mg | Freq: Once | INTRAMUSCULAR | Status: AC
  Administered 2022-03-04: 4 mg via INTRAVENOUS
  Filled 2022-03-04: qty 2

## 2022-03-04 MED ORDER — LIDOCAINE VISCOUS HCL 2 % MT SOLN
15.0000 mL | Freq: Once | OROMUCOSAL | Status: AC
  Administered 2022-03-04: 15 mL via ORAL
  Filled 2022-03-04: qty 15

## 2022-03-04 MED ORDER — LACTATED RINGERS IV BOLUS
1000.0000 mL | Freq: Once | INTRAVENOUS | Status: AC
  Administered 2022-03-04: 1000 mL via INTRAVENOUS

## 2022-03-04 NOTE — ED Provider Notes (Signed)
Emergency Medicine Observation Re-evaluation Note  Eluterio R Lovin is a 56 y.o. male, seen on rounds today.  Pt initially presented to the ED for complaints of Alcohol Intoxication, Suicidal, and Abdominal Pain Currently, the patient is hemodynamically stable in no acute distress.  No acute events overnight  Physical Exam  BP 117/72   Pulse 66   Temp 97.7 F (36.5 C) (Oral)   Resp 16   Ht '5\' 7"'$  (1.702 m)   Wt 63.5 kg   SpO2 97%   BMI 21.93 kg/m  Physical Exam General: Appears to be resting comfortably in bed, no acute distress. Cardiac: Regular rate, normal heart rate, non-emergent blood pressure for this morning's vitals. Lungs: No increased work of breathing.  Equal chest rise appreciated Psych: Calm, asleep in bed.   ED Course / MDM  EKG:   I have reviewed the labs performed to date as well as medications administered while in observation.  Recent changes in the last 24 hours include initiation of psychiatric hold protocol overnight.  Plan  Current plan is for reassessment in the a.m of suicidality when patient is clinically sober with psychiatric consultation.    Tretha Sciara, MD 03/04/22 [phone]

## 2022-03-04 NOTE — ED Provider Notes (Signed)
[facility] DEPT Provider Note   CSN: [id number] Arrival date & time: 03/03/22  2205     History  Chief Complaint  Patient presents with   Alcohol Intoxication   Suicidal   Abdominal Pain    Austin [name] is a 56 y.o. male.   Alcohol Intoxication Associated symptoms include abdominal pain.  Abdominal Pain Associated symptoms: diarrhea, nausea and vomiting    Patient is a 56 year old male with history of recurrent alcoholic pancreatitis, thrombocytopenia, elevated LFTs brought in by [facility] to the emergency department for evaluation of alcohol intoxication and abdominal pain.  Patient states he has had abdominal pain for a week.  Pain is located on the right upper quadrant, dull and constant.  He reports nausea with one episode of bilious vomiting this morning.  He reports 1 week of diarrhea, not noticing blood.  Patient states he has been drinking vodka for days.  Denies chest pain, shortness of breath, fever, constipation.  Patient states "I want to kill myself" but denies any attempts.    Home Medications Prior to Admission medications   Medication Sig Start Date End Date Taking? Authorizing Provider  acetaminophen (TYLENOL) 500 MG tablet Take 1,000 mg by mouth every 6 (six) hours as needed for headache (pain).    [provider]  busPIRone (BUSPAR) 7.5 MG tablet Take 7.5 mg by mouth 2 (two) times daily. 07/31/21   [provider]  cyclobenzaprine (FLEXERIL) 5 MG tablet Take 5 mg by mouth at bedtime as needed for muscle spasms. 07/27/21   [provider]  diclofenac Sodium (VOLTAREN) 1 % GEL SMARTSIG:4 Gram(s) Topical 4 Times Daily PRN 08/10/21   [provider]  escitalopram (LEXAPRO) 5 MG tablet Take 1 tablet (5 mg total) by mouth at bedtime. 09/10/21   Oswald Hillock, MD  folic acid (FOLVITE) 1 MG tablet Take 1 tablet (1 mg total) by mouth daily. 09/11/21   Oswald Hillock, MD  hydrOXYzine (ATARAX) 25 MG tablet Take 50 mg by  mouth at bedtime. 09/05/21   [provider]  ketorolac (TORADOL) 10 MG tablet Take 10 mg by mouth 3 (three) times daily as needed for moderate pain or severe pain. 09/05/21   [provider]  loperamide (IMODIUM A-D) 2 MG tablet Take 2 mg by mouth See admin instructions. Take one tablet (2 mg) by mouth every morning, may take one tablet (2 mg) in the afternoon as needed for diarrhea 07/04/20   [provider]  Magnesium 400 MG TABS Take 400 mg by mouth daily.    [provider]  Multiple Vitamins-Minerals (MULTIVITAMIN ADULTS) TABS Take 1 tablet by mouth daily.    [provider]  ondansetron (ZOFRAN) 4 MG tablet Take 1 tablet (4 mg total) by mouth every 6 (six) hours. Patient taking differently: Take 4 mg by mouth daily as needed for nausea or vomiting. 02/17/21   Meccariello, Bernita Raisin, MD  ondansetron (ZOFRAN-ODT) 8 MG disintegrating tablet Take 8 mg by mouth 2 (two) times daily as needed for nausea or vomiting.    [provider]  potassium chloride SA (KLOR-CON M) 20 MEQ tablet Take 1 tablet (20 mEq total) by mouth 2 (two) times daily. 08/10/12   Prince Rome, PA-C  thiamine 100 MG tablet Take 1 tablet (100 mg total) by mouth daily. 09/11/21   Oswald Hillock, MD      Allergies    Chlorhexidine    Review of Systems   Review of  Systems  HENT:         Headache  Gastrointestinal:  Positive for abdominal pain, diarrhea, nausea and vomiting.    Physical Exam Updated Vital Signs BP 117/72   Pulse 66   Temp 97.7 F (36.5 C) (Oral)   Resp 16   Ht '5\' 7"'$  (1.702 m)   Wt 63.5 kg   SpO2 97%   BMI 21.93 kg/m  Physical Exam Vitals and nursing note reviewed.  Constitutional:      Appearance: Normal appearance. He is well-developed. He is toxic-appearing.  HENT:     Head: Normocephalic and atraumatic.     Mouth/Throat:     Mouth: Mucous membranes are moist.  Eyes:     General: No scleral icterus.    Conjunctiva/sclera: Conjunctivae  normal.  Cardiovascular:     Rate and Rhythm: Normal rate and regular rhythm.     Pulses: Normal pulses.     Heart sounds: Normal heart sounds. No murmur heard. Pulmonary:     Effort: Pulmonary effort is normal. No respiratory distress.     Breath sounds: Normal breath sounds.  Abdominal:     General: Abdomen is flat.     Palpations: Abdomen is soft.     Tenderness: There is generalized abdominal tenderness and tenderness in the right upper quadrant.  Musculoskeletal:        General: No swelling or deformity.     Cervical back: Neck supple.  Skin:    General: Skin is warm and dry.     Capillary Refill: Capillary refill takes less than 2 seconds.     Findings: No rash.  Neurological:     General: No focal deficit present.     Mental Status: He is alert and oriented to person, place, and time.  Psychiatric:     Comments: SI. Pt states "I want to kill myself."     ED Results / Procedures / Treatments   Labs (all labs ordered are listed, but only abnormal results are displayed) Labs Reviewed  COMPREHENSIVE METABOLIC PANEL - Abnormal; Notable for the following components:      Result Value   Calcium 8.6 (*)    AST 47 (*)    All other components within normal limits  ETHANOL - Abnormal; Notable for the following components:   Alcohol, Ethyl (B) 280 (*)    All other components within normal limits  SALICYLATE LEVEL - Abnormal; Notable for the following components:   Salicylate Lvl <5.8 (*)    All other components within normal limits  ACETAMINOPHEN LEVEL - Abnormal; Notable for the following components:   Acetaminophen (Tylenol), Serum <10 (*)    All other components within normal limits  SARS CORONAVIRUS 2 BY RT PCR  RESP PANEL BY RT-PCR (FLU A&B, COVID) ARPGX2  CBC  RAPID URINE DRUG SCREEN, HOSP PERFORMED  LIPASE, BLOOD    EKG None  Radiology No results found.  Procedures Procedures    Medications Ordered in ED Medications  HYDROcodone-acetaminophen  (NORCO/VICODIN) 5-325 MG per tablet 1 tablet (1 tablet Oral Given 03/04/22 0057)  ondansetron (ZOFRAN) injection 4 mg (4 mg Intravenous Given 03/04/22 0056)  lactated ringers bolus 1,000 mL (0 mLs Intravenous Stopped 03/04/22 0339)  alum & mag hydroxide-simeth (MAALOX/MYLANTA) 200-200-20 MG/5ML suspension 30 mL (30 mLs Oral Given 03/04/22 0100)    And  lidocaine (XYLOCAINE) 2 % viscous mouth solution 15 mL (15 mLs Oral Given 03/04/22 0100)    ED Course/ Medical Decision Making/ A&P  Medical Decision Making Amount and/or Complexity of Data Reviewed Labs: ordered.  Risk Prescription drug management.   This patient presents to the ED for concern of abdominal pain, SI, this involves an extensive number of treatment options, and is a complaint that carries with it a high risk of complications and morbidity.  The differential diagnosis includes pancreatitis, alcohol intoxication, alcoholic gastritis, SI   Co morbidities that complicate the patient evaluation  See HPI   Additional history obtained:  Additional history obtained from EMR External records from outside source obtained and reviewed including Care Everywhere/External Records and Primary Care Documents  Lab Tests:  I Ordered, and personally interpreted labs.  The pertinent results include: alcohol elevated to 80.  Slightly elevated AST 47.  No electrolyte abnormalities noted.  Renal function within normal limits.  Lipase within normal limits.  Rapid urine drug screen negative.   Imaging Studies ordered:  I ordered imaging studies including n/a  I independently visualized and interpreted imaging which showed n/a I agree with the radiologist interpretation   Cardiac Monitoring: / EKG:  The patient was maintained on a cardiac monitor.  I personally viewed and interpreted the cardiac monitored which showed an underlying rhythm of: sinus rhythm   Consultations Obtained:  N/a   Problem List / ED  Course / Critical interventions / Medication management  Abdominal pain, SI Vitals signs within normal range and stable throughout visit. Laboratory/imaging studies significant for: See above On physical exam patient is afebrile and he is in no acute distress.  He has tenderness to palpation to right upper quadrant and epigastric, no guarding, no distention.  His symptoms include headache, nausea, dizziness likely due to alcohol intoxication with alcohol level elevated at 280. Patient with SI denies any attempts. Will place pt on psych hold for monitoring. I ordered medication including Zofran 4 mg for nausea vomiting, Vicodin for pain and GI cocktail. Reevaluation of the patient after these medicines showed that the patient improved I have reviewed the patients home medicines and have made adjustments as needed    Social Determinants of Health:  N/a   Test / Admission - Considered: - Signed out patient at shift change. NP Erasmo Score recommends observation of patient in the ED and for the daytime psych provider to see him.        Final Clinical Impression(s) / ED Diagnoses Final diagnoses:  None    Rx / DC Orders ED Discharge Orders     None         Rex Kras, PA 80/99/83 3825    Delora Fuel, MD 05/39/76 [phone]
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
## 2022-03-04 NOTE — ED Notes (Signed)
Patient Alert and oriented to baseline. Stable and ambulatory to baseline. Patient verbalized understanding of the discharge instructions.  Patient belongings were taken by the patient.   

## 2022-03-04 NOTE — Discharge Summary (Signed)
[facility] Psych ED Discharge  03/04/2022 11:44 AM Austin [name]  MRN:  [id number]  Principal Problem: Alcohol intoxication with moderate or severe use disorder, uncomplicated ([place]) Discharge Diagnoses: Principal Problem:   Alcohol intoxication with moderate or severe use disorder, uncomplicated (HCC)  Clinical Impression:  Final diagnoses:  Alcohol intoxication with moderate or severe use disorder, uncomplicated (HCC)   Subjective: Austin [name] is a 56 year old male with history of recurrent alcoholic pancreatitis, thrombocytopenia, elevated LFTs brought in by [employer] to the emergency department for evaluation of alcohol intoxication and abdominal pain.  On evaluation today, he states he came into the ED because " I was drunk and thinking about suicide." He denies having a plan or any previous attempts of suicide. He denies use of illicit substances such as crack, cocaine, meth and marijuana but endorses tobacco use. He reports drinking about a fifth of alcohol throughout the day on the weekends, as he works during the week and does not drink. Substance abuse treatment was offered and patient declined. He reports living alone but has an aide that comes in. He denies AVH, suicide and homicidal ideations. He reports having a good appetite but states he can only sleep when he drinks, otherwise he toss and turns all night because he has bad anxiety. He states he does not see a psychiatric provider but states he does take medications for anxiety and depression but cannot recall the names. He requests discharge home. States that he can call someone that can arrange transportation for him.   On evaluation patient is alert and oriented x 4, pleasant, and cooperative. Speech is clear and coherent. Mood is euthymic and affect is congruent with mood. Thought process is coherent and thought content is logical. Denies auditory and visual hallucinations. No indication that patient is responding to internal stimuli. No  evidence of delusional thought content. Denies suicidal ideations. Denies homicidal ideations.    TTS Note 03/04/22 5:15 am:   Pt contacted police to bring him to [facility].  Pt is depressed about a breakup with girlfriend 3 weeks ago.  Pt came in initially saying he wanted to kill himself.  Pt has no plan.  At this time he denies any SI.  Pt says he will drink up to a 5th of liquor on a Friday, Saturday, Sunday.  Pt drank tonight about a pint of liquor.  He had a BAL of 280 at 22:43.  He was inebriated at the time he made the statments about killing himself.  Pt denies previous attempts.  Patient denies any HI or A/V hallucinations.  Patient denies any weapons in the house.  His appetite has been poor.  Reports diahreha and weight loss.  Pt has had diahreah constantly over the last 3 weeks.  Pt reports sleep has not been good,  less than 4H/D.  Symptoms came up after break up w/ girlfriend.  Pt lives by himself.  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  ED Assessment Time Calculation: Start Time: 0950 Stop Time: 1010 Total Time in Minutes (Assessment Completion): 20   Past Psychiatric History: Alcohol use disorder  Past Medical History:  Past Medical History:  Diagnosis Date   Alcohol abuse    Anxiety    Arthritis    Blind in both eyes    sees lights and shadows   Elevated LFTs    Foot and toe(s), blister    bilat feet, blistering between toes  GERD (gastroesophageal reflux disease)    Glaucoma    Glaucoma as birth trauma    Headache(784.0)    Hypertension    hx of   Insomnia    Pancreatitis    UTI (lower urinary tract infection) 08/04/11   finishing abx tomorrow   Weight loss    25 lb wt loss since 08/2010   Wrist fracture, right     Past Surgical History:  Procedure Laterality Date   COLONOSCOPY  2006?   ESOPHAGOGASTRODUODENOSCOPY     ESOPHAGOGASTRODUODENOSCOPY (EGD) WITH PROPOFOL N/A 05/09/2017   Procedure:  ESOPHAGOGASTRODUODENOSCOPY (EGD) WITH PROPOFOL;  Surgeon: Doran Stabler, MD;  Location: [facility];  Service: Gastroenterology;  Laterality: N/A;   EYE SURGERY Left    EYE SURGERY Right    prostetic eye   foot srugery     removal of bone near small toe   FRACTURE SURGERY Right    wrist   HARDWARE REMOVAL  08/06/2011   Procedure: HARDWARE REMOVAL;  Surgeon: Newt Minion, MD;  Location: [facility];  Service: Orthopedics;  Laterality: Right;  Removal Deep Retained Hardware Right Distal Radius   MULTIPLE TOOTH EXTRACTIONS     for dentures   TOTAL HIP ARTHROPLASTY Left 06/06/2019   Procedure: LEFT TOTAL HIP ARTHROPLASTY-DIRECT ANTERIOR;  Surgeon: [name], MD;  Location: [facility];  Service: Orthopedics;  Laterality: Left;   TOTAL HIP ARTHROPLASTY Right 01/07/2020   Procedure: RIGHT TOTAL HIP ARTHROPLASTY DIRECT ANTERIOR;  Surgeon: [name], MD;  Location: [facility];  Service: Orthopedics;  Laterality: Right;   UPPER GASTROINTESTINAL ENDOSCOPY     Family History:  Family History  Problem Relation Age of Onset   Diabetes Sister    Diabetes Maternal Aunt        aunt and uncles   Hypertension Sister        aunt and uncles maternal side   Colon cancer Neg Hx    Colon polyps Neg Hx    Esophageal cancer Neg Hx    Rectal cancer Neg Hx    Stomach cancer Neg Hx    Social History:  Social History   Substance and Sexual Activity  Alcohol Use Yes     Social History   Substance and Sexual Activity  Drug Use Yes   Frequency: 7.0 times per week   Types: Marijuana   Comment: smokes marijuana 1 to 2 times daily    Social History   Socioeconomic History   Marital status: Significant Other    Spouse name: Not on file   Number of children: 1   Years of education: Not on file   Highest education level: Not on file  Occupational History   Occupation: disabled  Tobacco Use   Smoking status: Every Day    Types: Cigars   Smokeless tobacco: Never   Tobacco comments:    2-3 cigars a day    Vaping Use   Vaping Use: Never used  Substance and Sexual Activity   Alcohol use: Yes   Drug use: Yes    Frequency: 7.0 times per week    Types: Marijuana    Comment: smokes marijuana 1 to 2 times daily   Sexual activity: Not Currently  Other Topics Concern   Not on file  Social History Narrative   Not on file   Social Determinants of Health   Financial Resource Strain: Not on file  Food Insecurity: Not on file  Transportation Needs: Not on file  Physical Activity: Not  on file  Stress: Not on file  Social Connections: Not on file    Tobacco Cessation:  A prescription for an FDA-approved tobacco cessation medication was offered at discharge and the patient refused  Current Medications: No current facility-administered medications for this encounter.   Current Outpatient Medications  Medication Sig Dispense Refill   acetaminophen (TYLENOL) 500 MG tablet Take 1,000 mg by mouth every 6 (six) hours as needed for headache (pain).     busPIRone (BUSPAR) 7.5 MG tablet Take 7.5 mg by mouth 2 (two) times daily.     diclofenac Sodium (VOLTAREN) 1 % GEL Apply 4 g topically 4 (four) times daily as needed (pain).     disulfiram (ANTABUSE) 250 MG tablet Take 250 mg by mouth daily.     escitalopram (LEXAPRO) 10 MG tablet Take 10 mg by mouth daily.     escitalopram (LEXAPRO) 5 MG tablet Take 1 tablet (5 mg total) by mouth at bedtime. (Patient not taking: Reported on 03/04/2022) 30 tablet 3   fluticasone (FLONASE) 50 MCG/ACT nasal spray Place 2 sprays into both nostrils daily as needed for allergies.     folic acid (FOLVITE) 1 MG tablet Take 1 tablet (1 mg total) by mouth daily. 30 tablet 1   gabapentin (NEURONTIN) 300 MG capsule Take 300 mg by mouth daily.     hydrOXYzine (ATARAX) 25 MG tablet Take 50 mg by mouth at bedtime.     loperamide (IMODIUM A-D) 2 MG tablet Take 2 mg by mouth See admin instructions. Take one tablet (2 mg) by mouth every morning, may take one tablet (2 mg) in the  afternoon as needed for diarrhea     magnesium oxide (MAG-OX) 400 (240 Mg) MG tablet Take 400 mg by mouth 2 (two) times daily.     Multiple Vitamins-Minerals (MULTIVITAMIN ADULTS) TABS Take 1 tablet by mouth daily.     omeprazole (PRILOSEC) 20 MG capsule Take 20 mg by mouth daily.     ondansetron (ZOFRAN) 4 MG tablet Take 1 tablet (4 mg total) by mouth every 6 (six) hours. (Patient taking differently: Take 4 mg by mouth daily as needed for nausea or vomiting.) 12 tablet 0   potassium chloride (KLOR-CON M) 10 MEQ tablet Take 10 mEq by mouth 2 (two) times daily.     potassium chloride SA (KLOR-CON M) 20 MEQ tablet Take 1 tablet (20 mEq total) by mouth 2 (two) times daily. (Patient not taking: Reported on 03/04/2022) 6 tablet 0   thiamine 100 MG tablet Take 1 tablet (100 mg total) by mouth daily. 30 tablet 3   traZODone (DESYREL) 100 MG tablet Take 100 mg by mouth at bedtime.     PTA Medications: (Not in a hospital admission)   [place]:  [facility] from 03/03/2022 in [facility]PT ED from 01/12/2022 in [facility] DEPT ED to Hosp-Admission (Discharged) from 09/07/2021 in [facility]  C-SSRS RISK CATEGORY Low Risk No Risk Low Risk       Musculoskeletal: Strength & Muscle Tone: within normal limits Gait & Station: normal Patient leans: N/A  Psychiatric Specialty Exam: Presentation  General Appearance: Appropriate for Environment; Neat  Eye Contact:Fair  Speech:Clear and Coherent; Normal Rate  Speech Volume:Normal  Handedness:Right   Mood and Affect  Mood:Euthymic  Affect:Appropriate; Congruent   Thought Process  Thought Processes:Coherent; Linear; Goal Directed  Descriptions of Associations:Intact  Orientation:Full (Time, Place and Person)  Thought Content:Logical  History  of Schizophrenia/Schizoaffective disorder:No  Duration of Psychotic Symptoms:No data  recorded Hallucinations:Hallucinations: None  Ideas of Reference:None  Suicidal Thoughts:Suicidal Thoughts: No  Homicidal Thoughts:Homicidal Thoughts: No   Sensorium  Memory:Immediate Good; Recent Good; Remote Good  Judgment:Fair  Insight:Good   Executive Functions  Concentration:Good  Attention Span:Good  [place] of Knowledge:Good  Language:Good   Psychomotor Activity  Psychomotor Activity:Psychomotor Activity: Normal   Assets  Assets:Communication Skills; Desire for Improvement; Financial Resources/Insurance; Housing; Physical Health   Sleep  Sleep:Sleep: Fair    Physical Exam: Physical Exam Constitutional:      General: He is not in acute distress.    Appearance: He is not ill-appearing, toxic-appearing or diaphoretic.  HENT:     Right Ear: External ear normal.     Left Ear: External ear normal.  Cardiovascular:     Rate and Rhythm: Normal rate.  Pulmonary:     Effort: Pulmonary effort is normal. No respiratory distress.  Musculoskeletal:        General: Normal range of motion.  Neurological:     Mental Status: He is alert and oriented to person, place, and time.  Psychiatric:        Thought Content: Thought content is not paranoid or delusional. Thought content does not include homicidal or suicidal ideation.    Review of Systems  Constitutional:  Negative for chills, diaphoresis, fever, malaise/fatigue and weight loss.  Cardiovascular:  Negative for chest pain and palpitations.  Gastrointestinal:  Negative for diarrhea, nausea and vomiting.  Neurological:  Negative for dizziness and seizures.  Psychiatric/Behavioral:  Positive for depression and substance abuse. Negative for hallucinations, memory loss and suicidal ideas. The patient is nervous/anxious and has insomnia.    Blood pressure (!) 146/82, pulse 76, temperature 98.8 F (37.1 C), temperature source Oral, resp. rate 16, height '5\' 7"'$  (1.702 m), weight 63.5 kg, SpO2 100 %.  Body mass index is 21.93 kg/m.   Demographic Factors:  Male and Living alone  Loss Factors: NA  Historical Factors: Family history of mental illness or substance abuse  Risk Reduction Factors:   Religious beliefs about death, Employed, Positive social support, and Positive coping skills or problem solving skills  Continued Clinical Symptoms:  Alcohol/Substance Abuse/Dependencies Previous Psychiatric Diagnoses and Treatments  Cognitive Features That Contribute To Risk:  None    Suicide Risk:  Mild:  Suicidal ideation of limited frequency, intensity, duration, and specificity.  There are no identifiable plans, no associated intent, mild dysphoria and related symptoms, good self-control (both objective and subjective assessment), few other risk factors, and identifiable protective factors, including available and accessible social support.   Medical Decision Making: Austin [name] is a 56 year old male with history of recurrent alcoholic pancreatitis, thrombocytopenia, elevated LFTs brought in by [facility] to the emergency department for evaluation of alcohol intoxication and abdominal pain. Patient reported suicidal ideations without a plan to EDP.   At time of discharge, patient denies SI, HI, AVH and is able to contract for safety. He demonstrated no overt evidence of psychosis or mania. Prior to discharge. Patient verbalized that he understood warning signs, triggers, and symptoms of worsening mental health and how to access emergency mental health care if they felt it was needed. Patient was instructed to call 911 or return to the emergency room if they experienced any concerning symptoms after discharge. Patient voiced understanding and agreed to this.    Problem 1: Alcohol intoxication with alcohol use disorder    Disposition: No evidence of imminent risk to self or  others at present.   Patient does not meet criteria for psychiatric inpatient admission. Supportive therapy provided  about ongoing stressors. Discussed crisis plan, support from social network, calling 911, coming to the Emergency Department, and calling Suicide Hotline.     Discharge Instructions       Discharge recommendations:  Patient is to take medications as prescribed. Please see information for follow-up appointment with psychiatry and therapy. Please follow up with your primary care provider for all medical related needs.   Therapy: We recommend that patient participate in individual therapy to address mental health concerns.  Medications: The patient is to contact a medical professional and/or outpatient provider to address any new side effects that develop. Patient should update outpatient providers of any new medications and/or medication changes.    Safety:  The patient should abstain from use of illicit substances/drugs and abuse of any medications. If symptoms worsen or do not continue to improve or if the patient becomes actively suicidal or homicidal then it is recommended that the patient return to the closest hospital emergency department, the [facility], or call 911 for further evaluation and treatment. National Suicide Prevention Lifeline 1-800-SUICIDE or 814-262-4970.  About 988 988 offers 24/7 access to trained crisis counselors who can help people experiencing mental health-related distress. People can call or text 988 or chat 988lifeline.org for themselves or if they are worried about a loved one who may need crisis support.  Crisis Mobile: [facility]:                     [phone] (for crisis response 24 hours a day) [facility] Hotline:                                            206-746-5836   =============================================================== Substance Abuse Treatment Programs    [facility]                                   [address], [place]                                                     [facility] [place] [address] [place], [place] Outpatient                             (  Inpatient and outpatient)                                             [address]                                                                                                                [place] [phone] (Suboxone and Methadone)   [place], [place] [address]                                       [phone]                                                     [address] [place], [place]   [facility] ([place], [place])                    (insurance only) [phone]                                                                                                                                    [facility] ([place]) [place], [place]                            [facility]                                        [place]  [place], [place]                                                     [facility] (for caregivers and family) [phone] [address] Kristeen [place]. [place], [place] [facility] and Counseling   [facility]: Mobile Crisis Management 24 hours:  [phone]   [facility] [employer] sliding scale fee and walk in schedule: M-F 8am-12pm/1pm-3pm [place], [place] [address] [address] [place], [place]  [address] [phone]   [facility] (Formerly known as [facility])- new patient walk-in appointments available Monday - Friday 8am -3pm.          [address] [place], [place] [address] [phone] or crisis line- [facility]/ Intensive Outpatient Therapy Program [place], [place] [address] [place]                                                             [phone] [address] [place], [place] [address]                                                 [place]   [facility] [phone]. [place], [place] [address]     [facility]  [address] Johnette Abraham  [place], [place] [address]                                                           [phone]   [facility] [address] [place], [place] [address] [phone]   [facility]                                   [address] [place],  [address]                                               [place], [place]                                                [place] [address]                                                [phone]                                         [facility] [place] [address]   [facility]                                               [address] [place], [place]  [place], [place], [place] [address] [place]                                               [address]                                                [place], [place] [address]                                               [phone]                                                     [facility] [address] [place], [place] [address] [phone] Resources for Temporary Residential Assistance/Crisis [place] [facility]) M-F 8am-3pm   [address], [place] [address]   [phone] Services include: laundry, barbering, support groups, case management, phone  & computer access, showers, AA/NA mtgs, mental health/substance abuse nurse, job skills class, disability information, VA assistance, spiritual classes, etc.          Rozetta Nunnery, NP 03/04/2022, 11:44 AM

## 2022-03-04 NOTE — Discharge Instructions (Addendum)
Discharge recommendations:  Patient is to take medications as prescribed. Please see information for follow-up appointment with psychiatry and therapy. Please follow up with your primary care provider for all medical related needs.   Therapy: We recommend that patient participate in individual therapy to address mental health concerns.  Medications: The patient is to contact a medical professional and/or outpatient provider to address any new side effects that develop. Patient should update outpatient providers of any new medications and/or medication changes.    Safety:  The patient should abstain from use of illicit substances/drugs and abuse of any medications. If symptoms worsen or do not continue to improve or if the patient becomes actively suicidal or homicidal then it is recommended that the patient return to the closest hospital emergency department, the [facility], or call 911 for further evaluation and treatment. National Suicide Prevention Lifeline 1-800-SUICIDE or 630-456-1965.  About 988 988 offers 24/7 access to trained crisis counselors who can help people experiencing mental health-related distress. People can call or text 988 or chat 988lifeline.org for themselves or if they are worried about a loved one who may need crisis support.  Crisis Mobile: [facility]:                     2252881892 (for crisis response 24 hours a day) [facility] Hotline:                                            909-030-0520   =============================================================== Substance Abuse Treatment Programs    [facility]                                   [address]                                                        [place], [place]                                                     [facility] [place] [address] [place], [place]                             (Inpatient and outpatient)  [place] [phone] (Suboxone and Methadone)   [place], [place] [address]                                       [place] [address] [place], [place]   [facility] (Outpatient/Inpatient, [place])                    (insurance only) [phone]                                                                                                                                    [facility] ([place]) [place], [place]                            [facility]                                        [place], [place]  [phone]                                                     [facility] (for caregivers and family) [address] [place]. [place], [place] [facility]   [facility]: Mobile Crisis Management 24 hours:  [phone]   [facility] [employer] sliding scale fee and walk in schedule: M-F 8am-12pm/1pm-3pm [place], [place] [address] [address] [place], [place] [address] [phone]   [facility] ([facility])- new patient walk-in appointments available Monday - Friday 8am -3pm.          [address] [place], [place]  [address] [phone] or crisis line- [facility]/ Intensive Outpatient Therapy Program [place], [place] [address] [place]                                                             [phone] [address] [place], [place] [address]                                                 [place]   [facility] [phone]. [place], [place] [address]     [employer] [facility]                                                                                                             [address],  [place], [place] [address]                                                           (  [phone]   [facility] [address] [place], [place] [address] [phone]   [facility]                                   [address]                            [place], [place] [address]                                               [place], [place] [address]                                                [phone]                                         [facility] [place] [address]   [facility]                                               [address], [place]                                                     [place], [place], [place] [address] [phone]   [facility]  [address]                                                [place], [place] [address]                                               [phone]                                                     [facility] [address] [place], [place] [address] [phone] Resources for Temporary Residential Assistance/Crisis [place]) M-F 8am-3pm   [address], [place] [address]   [phone] Services include: laundry, barbering, support groups, case management, phone  & computer access, showers, AA/NA mtgs, mental health/substance abuse nurse, job skills class, disability information, VA assistance, spiritual classes, etc.

## 2022-03-05 ENCOUNTER — Ambulatory Visit: Payer: Medicare HMO | Admitting: Podiatry

## 2022-03-10 ENCOUNTER — Encounter (HOSPITAL_COMMUNITY): Payer: Self-pay | Admitting: Emergency Medicine

## 2022-03-10 ENCOUNTER — Other Ambulatory Visit: Payer: Self-pay

## 2022-03-10 ENCOUNTER — Emergency Department (HOSPITAL_COMMUNITY)
Admission: EM | Admit: 2022-03-10 | Discharge: 2022-03-11 | Discharge status: Against Medical Advice | Payer: Medicare HMO | Attending: Physician Assistant | Admitting: Physician Assistant

## 2022-03-10 DIAGNOSIS — Z96643 Presence of artificial hip joint, bilateral: Secondary | ICD-10-CM | POA: Insufficient documentation

## 2022-03-10 DIAGNOSIS — Z1152 Encounter for screening for COVID-19: Secondary | ICD-10-CM | POA: Diagnosis not present

## 2022-03-10 DIAGNOSIS — Z5321 Procedure and treatment not carried out due to patient leaving prior to being seen by health care provider: Secondary | ICD-10-CM | POA: Diagnosis not present

## 2022-03-10 DIAGNOSIS — R1084 Generalized abdominal pain: Secondary | ICD-10-CM | POA: Insufficient documentation

## 2022-03-10 DIAGNOSIS — Z79899 Other long term (current) drug therapy: Secondary | ICD-10-CM | POA: Diagnosis not present

## 2022-03-10 DIAGNOSIS — R112 Nausea with vomiting, unspecified: Secondary | ICD-10-CM | POA: Diagnosis not present

## 2022-03-10 DIAGNOSIS — R1013 Epigastric pain: Secondary | ICD-10-CM | POA: Diagnosis not present

## 2022-03-10 DIAGNOSIS — F17290 Nicotine dependence, other tobacco product, uncomplicated: Secondary | ICD-10-CM | POA: Diagnosis not present

## 2022-03-10 DIAGNOSIS — I10 Essential (primary) hypertension: Secondary | ICD-10-CM | POA: Diagnosis not present

## 2022-03-10 DIAGNOSIS — Y903 Blood alcohol level of 60-79 mg/100 ml: Secondary | ICD-10-CM | POA: Insufficient documentation

## 2022-03-10 DIAGNOSIS — R45851 Suicidal ideations: Secondary | ICD-10-CM | POA: Insufficient documentation

## 2022-03-10 DIAGNOSIS — F10288 Alcohol dependence with other alcohol-induced disorder: Secondary | ICD-10-CM | POA: Insufficient documentation

## 2022-03-10 DIAGNOSIS — F329 Major depressive disorder, single episode, unspecified: Secondary | ICD-10-CM | POA: Diagnosis not present

## 2022-03-10 DIAGNOSIS — R197 Diarrhea, unspecified: Secondary | ICD-10-CM | POA: Insufficient documentation

## 2022-03-10 LAB — CBC WITH DIFFERENTIAL/PLATELET
Abs Immature Granulocytes: 0.01 10*3/uL (ref 0.00–0.07)
Basophils Absolute: 0 10*3/uL (ref 0.0–0.1)
Basophils Relative: 1 %
Eosinophils Absolute: 0.1 10*3/uL (ref 0.0–0.5)
Eosinophils Relative: 1 %
HCT: 40 % (ref 39.0–52.0)
Hemoglobin: 14.5 g/dL (ref 13.0–17.0)
Immature Granulocytes: 0 %
Lymphocytes Relative: 52 %
Lymphs Abs: 2.2 10*3/uL (ref 0.7–4.0)
MCH: 32.4 pg (ref 26.0–34.0)
MCHC: 36.3 g/dL — ABNORMAL HIGH (ref 30.0–36.0)
MCV: 89.5 fL (ref 80.0–100.0)
Monocytes Absolute: 0.7 10*3/uL (ref 0.1–1.0)
Monocytes Relative: 15 %
Neutro Abs: 1.3 10*3/uL — ABNORMAL LOW (ref 1.7–7.7)
Neutrophils Relative %: 31 %
Platelets: 135 10*3/uL — ABNORMAL LOW (ref 150–400)
RBC: 4.47 MIL/uL (ref 4.22–5.81)
RDW: 13.7 % (ref 11.5–15.5)
WBC: 4.3 10*3/uL (ref 4.0–10.5)
nRBC: 0 % (ref 0.0–0.2)

## 2022-03-10 LAB — COMPREHENSIVE METABOLIC PANEL
ALT: 48 U/L — ABNORMAL HIGH (ref 0–44)
AST: 86 U/L — ABNORMAL HIGH (ref 15–41)
Albumin: 3.9 g/dL (ref 3.5–5.0)
Alkaline Phosphatase: 101 U/L (ref 38–126)
Anion gap: 14 (ref 5–15)
BUN: 7 mg/dL (ref 6–20)
CO2: 24 mmol/L (ref 22–32)
Calcium: 8.8 mg/dL — ABNORMAL LOW (ref 8.9–10.3)
Chloride: 98 mmol/L (ref 98–111)
Creatinine, Ser: 1.12 mg/dL (ref 0.61–1.24)
GFR, Estimated: >60 mL/min (ref >60)
Glucose, Bld: 115 mg/dL — ABNORMAL HIGH (ref 70–99)
Potassium: 3.5 mmol/L (ref 3.5–5.1)
Sodium: 136 mmol/L (ref 135–145)
Total Bilirubin: 1 mg/dL (ref 0.3–1.2)
Total Protein: 7.1 g/dL (ref 6.5–8.1)

## 2022-03-10 LAB — LIPASE, BLOOD: Lipase: 30 U/L (ref 11–51)

## 2022-03-10 LAB — ETHANOL: Alcohol, Ethyl (B): 227 mg/dL — ABNORMAL HIGH (ref <10)

## 2022-03-10 LAB — SALICYLATE LEVEL: Salicylate Lvl: <7 mg/dL — ABNORMAL LOW (ref 7.0–30.0)

## 2022-03-10 LAB — ACETAMINOPHEN LEVEL: Acetaminophen (Tylenol), Serum: <10 ug/mL — ABNORMAL LOW (ref 10–30)

## 2022-03-10 NOTE — ED Triage Notes (Signed)
Patient reports pain across his abdomen for several days with emesis and diarrhea .

## 2022-03-10 NOTE — ED Provider Triage Note (Addendum)
Emergency Medicine Provider Triage Evaluation Note  Austin [name] , [name] 57 y.o. male  was evaluated in triage.  Pt complains of multiple complaints.  States he had generalized abdominal pain, nausea without vomiting.  He has pain located to his epigastric region which she feels consistent with prior episodes of pancreatitis.  Still continues to drink alcohol daily.  States he has been depressed. Denies HI, AVH. Vague SI while in PCP office on note [name] few days ago however patient denies current SI Seen by PCP sent over here due to his etoh use and pain.  He would like to detox off alcohol. No melena or BRPRP  Review of Systems  Positive: Abd pain, diarrhea, depression Negative: Emesis, bloody stool  Physical Exam  There were no vitals taken for this visit. Gen:   Awake, no distress   Resp:  Normal effort  MSK:   Moves extremities without difficulty  ABD:  Tenderness to epigastric region Other:  Denies SI, HI, AVH.  Medical Decision Making  Medically screening exam initiated at 8:34 PM.  Appropriate orders placed.  Austin [name] was informed that the remainder of the evaluation will be completed by another provider, this initial triage assessment does not replace that evaluation, and the importance of remaining in the ED until their evaluation is complete.  Abd pain, etoh detox, depression       Austin Kleiner A, PA-C 03/10/22 2041

## 2022-03-11 ENCOUNTER — Emergency Department (HOSPITAL_BASED_OUTPATIENT_CLINIC_OR_DEPARTMENT_OTHER): Payer: Medicare HMO

## 2022-03-11 ENCOUNTER — Emergency Department (EMERGENCY_DEPARTMENT_HOSPITAL)
Admission: EM | Admit: 2022-03-11 | Discharge: 2022-03-12 | Disposition: A | Discharge status: Home / Self Care | Payer: Medicare HMO | Source: Home / Self Care | Attending: Emergency Medicine | Admitting: Emergency Medicine

## 2022-03-11 ENCOUNTER — Encounter (HOSPITAL_BASED_OUTPATIENT_CLINIC_OR_DEPARTMENT_OTHER): Payer: Self-pay

## 2022-03-11 DIAGNOSIS — F17290 Nicotine dependence, other tobacco product, uncomplicated: Secondary | ICD-10-CM | POA: Insufficient documentation

## 2022-03-11 DIAGNOSIS — R1013 Epigastric pain: Secondary | ICD-10-CM | POA: Insufficient documentation

## 2022-03-11 DIAGNOSIS — Y903 Blood alcohol level of 60-79 mg/100 ml: Secondary | ICD-10-CM | POA: Insufficient documentation

## 2022-03-11 DIAGNOSIS — F329 Major depressive disorder, single episode, unspecified: Secondary | ICD-10-CM | POA: Insufficient documentation

## 2022-03-11 DIAGNOSIS — Z79899 Other long term (current) drug therapy: Secondary | ICD-10-CM | POA: Insufficient documentation

## 2022-03-11 DIAGNOSIS — R112 Nausea with vomiting, unspecified: Secondary | ICD-10-CM | POA: Insufficient documentation

## 2022-03-11 DIAGNOSIS — Z20822 Contact with and (suspected) exposure to covid-19: Secondary | ICD-10-CM | POA: Insufficient documentation

## 2022-03-11 DIAGNOSIS — F10288 Alcohol dependence with other alcohol-induced disorder: Secondary | ICD-10-CM | POA: Insufficient documentation

## 2022-03-11 DIAGNOSIS — Z96643 Presence of artificial hip joint, bilateral: Secondary | ICD-10-CM | POA: Insufficient documentation

## 2022-03-11 DIAGNOSIS — R45851 Suicidal ideations: Secondary | ICD-10-CM | POA: Insufficient documentation

## 2022-03-11 DIAGNOSIS — I10 Essential (primary) hypertension: Secondary | ICD-10-CM | POA: Insufficient documentation

## 2022-03-11 DIAGNOSIS — F1090 Alcohol use, unspecified, uncomplicated: Secondary | ICD-10-CM

## 2022-03-11 DIAGNOSIS — R1084 Generalized abdominal pain: Secondary | ICD-10-CM | POA: Diagnosis not present

## 2022-03-11 DIAGNOSIS — F1010 Alcohol abuse, uncomplicated: Secondary | ICD-10-CM | POA: Diagnosis present

## 2022-03-11 DIAGNOSIS — F1020 Alcohol dependence, uncomplicated: Secondary | ICD-10-CM | POA: Diagnosis present

## 2022-03-11 LAB — COMPREHENSIVE METABOLIC PANEL
ALT: 41 U/L (ref 0–44)
AST: 72 U/L — ABNORMAL HIGH (ref 15–41)
Albumin: 4.3 g/dL (ref 3.5–5.0)
Alkaline Phosphatase: 102 U/L (ref 38–126)
Anion gap: 13 (ref 5–15)
BUN: 6 mg/dL (ref 6–20)
CO2: 24 mmol/L (ref 22–32)
Calcium: 9.1 mg/dL (ref 8.9–10.3)
Chloride: 95 mmol/L — ABNORMAL LOW (ref 98–111)
Creatinine, Ser: 1.12 mg/dL (ref 0.61–1.24)
GFR, Estimated: >60 mL/min (ref >60)
Glucose, Bld: 119 mg/dL — ABNORMAL HIGH (ref 70–99)
Potassium: 3.6 mmol/L (ref 3.5–5.1)
Sodium: 132 mmol/L — ABNORMAL LOW (ref 135–145)
Total Bilirubin: 0.8 mg/dL (ref 0.3–1.2)
Total Protein: 6.9 g/dL (ref 6.5–8.1)

## 2022-03-11 LAB — CBC WITH DIFFERENTIAL/PLATELET
Abs Immature Granulocytes: 0 10*3/uL (ref 0.00–0.07)
Basophils Absolute: 0 10*3/uL (ref 0.0–0.1)
Basophils Relative: 0 %
Eosinophils Absolute: 0 10*3/uL (ref 0.0–0.5)
Eosinophils Relative: 1 %
HCT: 39.6 % (ref 39.0–52.0)
Hemoglobin: 14 g/dL (ref 13.0–17.0)
Immature Granulocytes: 0 %
Lymphocytes Relative: 38 %
Lymphs Abs: 1.6 10*3/uL (ref 0.7–4.0)
MCH: 31.8 pg (ref 26.0–34.0)
MCHC: 35.4 g/dL (ref 30.0–36.0)
MCV: 90 fL (ref 80.0–100.0)
Monocytes Absolute: 0.7 10*3/uL (ref 0.1–1.0)
Monocytes Relative: 16 %
Neutro Abs: 1.9 10*3/uL (ref 1.7–7.7)
Neutrophils Relative %: 45 %
Platelets: 124 10*3/uL — ABNORMAL LOW (ref 150–400)
RBC: 4.4 MIL/uL (ref 4.22–5.81)
RDW: 13.4 % (ref 11.5–15.5)
WBC: 4.2 10*3/uL (ref 4.0–10.5)
nRBC: 0 % (ref 0.0–0.2)

## 2022-03-11 LAB — RAPID URINE DRUG SCREEN, HOSP PERFORMED
Amphetamines: NOT DETECTED
Barbiturates: NOT DETECTED
Benzodiazepines: NOT DETECTED
Cocaine: NOT DETECTED
Opiates: POSITIVE — AB
Tetrahydrocannabinol: NOT DETECTED

## 2022-03-11 LAB — SALICYLATE LEVEL: Salicylate Lvl: <7 mg/dL — ABNORMAL LOW (ref 7.0–30.0)

## 2022-03-11 LAB — URINALYSIS, ROUTINE W REFLEX MICROSCOPIC
Bilirubin Urine: NEGATIVE
Glucose, UA: NEGATIVE mg/dL
Ketones, ur: NEGATIVE mg/dL
Leukocytes,Ua: NEGATIVE
Nitrite: NEGATIVE
Specific Gravity, Urine: 1.028 (ref 1.005–1.030)
pH: 5.5 (ref 5.0–8.0)

## 2022-03-11 LAB — ACETAMINOPHEN LEVEL: Acetaminophen (Tylenol), Serum: <10 ug/mL — ABNORMAL LOW (ref 10–30)

## 2022-03-11 LAB — TROPONIN I (HIGH SENSITIVITY): Troponin I (High Sensitivity): <2 ng/L (ref <18)

## 2022-03-11 LAB — LIPASE, BLOOD: Lipase: 15 U/L (ref 11–51)

## 2022-03-11 LAB — ETHANOL: Alcohol, Ethyl (B): 63 mg/dL — ABNORMAL HIGH (ref <10)

## 2022-03-11 MED ORDER — THIAMINE MONONITRATE 100 MG PO TABS
100.0000 mg | ORAL_TABLET | Freq: Every day | ORAL | Status: DC
  Administered 2022-03-11: 100 mg via ORAL
  Filled 2022-03-11: qty 1

## 2022-03-11 MED ORDER — LORAZEPAM 1 MG PO TABS: 0.0000 mg | ORAL_TABLET | Freq: Two times a day (BID) | ORAL | Status: DC

## 2022-03-11 MED ORDER — LORAZEPAM 2 MG/ML IJ SOLN: 0.0000 mg | Freq: Four times a day (QID) | INTRAMUSCULAR | Status: DC

## 2022-03-11 MED ORDER — IOHEXOL 300 MG/ML  SOLN
80.0000 mL | Freq: Once | INTRAMUSCULAR | Status: AC | PRN
  Administered 2022-03-11: 80 mL via INTRAVENOUS

## 2022-03-11 MED ORDER — ONDANSETRON HCL 4 MG/2ML IJ SOLN
4.0000 mg | Freq: Once | INTRAMUSCULAR | Status: AC
  Administered 2022-03-11: 4 mg via INTRAVENOUS
  Filled 2022-03-11: qty 2

## 2022-03-11 MED ORDER — THIAMINE HCL 100 MG/ML IJ SOLN: 100.0000 mg | Freq: Every day | INTRAMUSCULAR | Status: DC

## 2022-03-11 MED ORDER — LORAZEPAM 1 MG PO TABS
0.0000 mg | ORAL_TABLET | Freq: Four times a day (QID) | ORAL | Status: DC
  Administered 2022-03-11: 2 mg via ORAL
  Filled 2022-03-11: qty 2

## 2022-03-11 MED ORDER — HYDROCODONE-ACETAMINOPHEN 5-325 MG PO TABS
1.0000 | ORAL_TABLET | Freq: Once | ORAL | Status: AC
  Administered 2022-03-11: 1 via ORAL
  Filled 2022-03-11: qty 1

## 2022-03-11 MED ORDER — LORAZEPAM 2 MG/ML IJ SOLN: 0.0000 mg | Freq: Two times a day (BID) | INTRAMUSCULAR | Status: DC

## 2022-03-11 NOTE — ED Notes (Signed)
This Probation officer called pt twice.

## 2022-03-11 NOTE — ED Notes (Signed)
Girlfriend's number is [phone] her name is Lexi Riles

## 2022-03-11 NOTE — ED Triage Notes (Addendum)
Pt states that he has been suffering pancreatitis x 1 week. Pt states last drink 3 hours ago- pt states 3 vodka drinks. Pt states he has been unable to eat.   Pt also states that he has had suicidal thoughts, with the thought of perhaps overdosing. Pt states he has been having difficulties with his girlfriend.

## 2022-03-11 NOTE — ED Provider Notes (Signed)
[facility] EMERGENCY DEPT Provider Note  CSN: [id number] Arrival date & time: 03/11/22 1534  Chief Complaint(s) Abdominal Pain and Suicidal  HPI Austin [name] is a 56 y.o. male history of blindness, chronic alcohol use, chronic pancreatitis presenting to the emergency department with abdominal pain and suicidal .  Patient reports that he has had chronic pancreatitis for a long time.  He reports it is currently stronger than normal.  He reports feeling dehydrated.  He has had some nausea and vomiting.  He continues to drink alcohol, reports around 1/5 a day, last earlier today.  Reported history of alcohol withdrawal.  He also reports increased symptoms of depression recently with recent suicidal thoughts, plan to overdose on alcohol and pills.  Denies any suicidal attempt today or ingestions.  He reports his abdominal pain is consistent with prior chronic pancreatitis.  Denies any hematemesis, melena.  Denies any lower abdominal pain.  Also reports recently some vague chest tightness, some shortness of breath.  Symptoms come and go, not with exertion.  No symptoms chest pain or shortness of breath currently. denies any homicidal ideation.  Denies hearing voices   Past Medical History Past Medical History:  Diagnosis Date   Alcohol abuse    Anxiety    Arthritis    Blind in both eyes    sees lights and shadows   Elevated LFTs    Foot and toe(s), blister    bilat feet, blistering between toes   GERD (gastroesophageal reflux disease)    Glaucoma    Glaucoma as birth trauma    Headache(784.0)    Hypertension    hx of   Insomnia    Pancreatitis    UTI (lower urinary tract infection) 08/04/11   finishing abx tomorrow   Weight loss    25 lb wt loss since 08/2010   Wrist fracture, right    Patient Active Problem List   Diagnosis Date Noted   Alcohol intoxication with moderate or severe use disorder, uncomplicated ([place]) 40/34/7425   Hypomagnesemia 01/12/2022   Hypokalemia  01/12/2022   Alcohol withdrawal with inpatient treatment ([place]) 09/07/2021   Thrombocytopenia ([place]) 09/07/2021   Elevated LFTs 09/07/2021   Passive suicidal ideations 95/63/8756   Acute alcoholic pancreatitis 43/32/9518   Hx of total hip arthroplasty, 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   History of total hip arthroplasty, 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   Acute pancreatitis 10/05/2019   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   Intractable nausea and vomiting 84/16/6063   Alcoholic pancreatitis 01/60/1093   Essential hypertension 04/14/2018   Blindness of both eyes 04/14/2018   Alcohol use 04/14/2018   Hyponatremia 04/14/2018   RUQ pain    Abnormal CT of the abdomen    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 Authorizing Provider  acetaminophen (TYLENOL) 500 MG tablet Take 1,000 mg by mouth every 6 (six) hours as needed for headache (pain).    [provider]  busPIRone (BUSPAR) 7.5 MG tablet Take 7.5 mg by mouth 2 (two) times daily. 07/31/21   [provider]  diclofenac Sodium (VOLTAREN) 1 % GEL Apply 4 g topically 4 (four) times daily as needed (pain). 08/10/21   [provider]  disulfiram (ANTABUSE) 250 MG tablet Take 250 mg by mouth daily.    [provider]  escitalopram (LEXAPRO) 10 MG tablet Take 10 mg by mouth  daily.    [provider]  fluticasone (FLONASE) 50 MCG/ACT nasal spray Place 2 sprays into both nostrils daily as needed for allergies.    [provider]  folic acid (FOLVITE) 1 MG tablet Take 1 tablet (1 mg total) by mouth daily. 09/11/21   Oswald Hillock, MD  gabapentin (NEURONTIN) 300 MG capsule Take 300 mg by mouth daily.    [provider]  hydrOXYzine (ATARAX) 25 MG tablet Take 50 mg by mouth at bedtime. 09/05/21   [provider]  loperamide (IMODIUM A-D) 2 MG  tablet Take 2 mg by mouth See admin instructions. Take one tablet (2 mg) by mouth every morning, may take one tablet (2 mg) in the afternoon as needed for diarrhea 07/04/20   [provider]  magnesium oxide (MAG-OX) 400 (240 Mg) MG tablet Take 400 mg by mouth 2 (two) times daily.    [provider]  Multiple Vitamins-Minerals (MULTIVITAMIN ADULTS) TABS Take 1 tablet by mouth daily.    [provider]  omeprazole (PRILOSEC) 20 MG capsule Take 20 mg by mouth daily.    [provider]  ondansetron (ZOFRAN) 4 MG tablet Take 1 tablet (4 mg total) by mouth every 6 (six) hours. Patient taking differently: Take 4 mg by mouth daily as needed for nausea or vomiting. 02/17/21   Meccariello, Bernita Raisin, MD  potassium chloride SA (KLOR-CON M) 20 MEQ tablet Take 1 tablet (20 mEq total) by mouth 2 (two) times daily. Patient not taking: Reported on 03/04/2022 10/08/95   Prince Rome, PA-C  thiamine 100 MG tablet Take 1 tablet (100 mg total) by mouth daily. 09/11/21   Oswald Hillock, MD  traZODone (DESYREL) 100 MG tablet Take 100 mg by mouth at bedtime.    [provider]                                                                                                                                    Past Surgical History Past Surgical History:  Procedure Laterality Date   COLONOSCOPY  2006?   ESOPHAGOGASTRODUODENOSCOPY     ESOPHAGOGASTRODUODENOSCOPY (EGD) WITH PROPOFOL N/A 05/09/2017   Procedure: ESOPHAGOGASTRODUODENOSCOPY (EGD) WITH PROPOFOL;  Surgeon: Doran Stabler, MD;  Location: [facility];  Service: Gastroenterology;  Laterality: N/A;   EYE SURGERY Left    EYE SURGERY Right    prostetic eye   foot srugery     removal of bone near small toe   FRACTURE SURGERY Right    wrist   HARDWARE REMOVAL  08/06/2011   Procedure: HARDWARE REMOVAL;  Surgeon: Newt Minion, MD;  Location: [facility];  Service: Orthopedics;  Laterality: Right;  Removal Deep Retained Hardware  Right Distal Radius   MULTIPLE TOOTH EXTRACTIONS     for dentures   TOTAL HIP ARTHROPLASTY Left 06/06/2019   Procedure: LEFT TOTAL HIP ARTHROPLASTY-DIRECT ANTERIOR;  Surgeon: [name], MD;  Location: [facility];  Service: Orthopedics;  Laterality: Left;   TOTAL HIP ARTHROPLASTY Right 01/07/2020   Procedure: RIGHT TOTAL HIP ARTHROPLASTY DIRECT ANTERIOR;  Surgeon: [name], MD;  Location: [facility];  Service: Orthopedics;  Laterality: Right;   UPPER GASTROINTESTINAL ENDOSCOPY     Family History Family History  Problem Relation Age of Onset   Diabetes Sister    Diabetes Maternal Aunt        aunt and uncles   Hypertension Sister        aunt and uncles maternal side   Colon cancer Neg Hx    Colon polyps Neg Hx    Esophageal cancer Neg Hx    Rectal cancer Neg Hx    Stomach cancer Neg Hx     Social History Social History   Tobacco Use   Smoking status: Every Day    Types: Cigars   Smokeless tobacco: Never   Tobacco comments:    2-3 cigars a day   Vaping Use   Vaping Use: Never used  Substance Use Topics   Alcohol use: Yes   Drug use: Not Currently    Frequency: 7.0 times per week    Types: Marijuana    Comment: smokes marijuana 1 to 2 times daily   Allergies Chlorhexidine  Review of Systems Review of Systems  All other systems reviewed and are negative.   Physical Exam Vital Signs  I have reviewed the triage vital signs BP 138/89   Pulse 80   Temp 98.6 F (37 C)   Resp 16   Ht '5\' 7"'$  (1.702 m)   Wt 63.5 kg   SpO2 100%   BMI 21.93 kg/m  Physical Exam Vitals and nursing note reviewed.  Constitutional:      General: He is not in acute distress.    Appearance: Normal appearance.  HENT:     Mouth/Throat:     Mouth: Mucous membranes are moist.  Eyes:     Conjunctiva/sclera: Conjunctivae normal.     Comments: Bilaterally blind  Cardiovascular:     Rate and Rhythm: Normal rate and regular rhythm.  Pulmonary:     Effort: Pulmonary effort is normal. No  respiratory distress.     Breath sounds: Normal breath sounds.  Abdominal:     General: Abdomen is flat.     Palpations: Abdomen is soft.     Tenderness: There is abdominal tenderness in the epigastric area.  Musculoskeletal:     Right lower leg: No edema.     Left lower leg: No edema.  Skin:    General: Skin is warm and dry.     Capillary Refill: Capillary refill takes less than 2 seconds.  Neurological:     Mental Status: He is alert and oriented to person, place, and time. Mental status is at baseline.  Psychiatric:        Mood and Affect: Mood normal.        Behavior: Behavior normal.        Thought Content: Thought content includes suicidal ideation. Thought content does not include homicidal ideation. Thought content includes suicidal plan. Thought content does not include homicidal plan.     ED Results and Treatments Labs (all labs ordered are listed, but only abnormal results are displayed) Labs Reviewed  COMPREHENSIVE METABOLIC PANEL - Abnormal; Notable for the following components:      Result Value   Sodium 132 (*)    Chloride 95 (*)    Glucose, Bld 119 (*)  AST 72 (*)    All other components within normal limits  CBC WITH DIFFERENTIAL/PLATELET - Abnormal; Notable for the following components:   Platelets 124 (*)    All other components within normal limits  URINALYSIS, ROUTINE W REFLEX MICROSCOPIC - Abnormal; Notable for the following components:   Hgb urine dipstick LARGE (*)    Protein, ur TRACE (*)    All other components within normal limits  ETHANOL - Abnormal; Notable for the following components:   Alcohol, Ethyl (B) 63 (*)    All other components within normal limits  SALICYLATE LEVEL - Abnormal; Notable for the following components:   Salicylate Lvl <2.4 (*)    All other components within normal limits  ACETAMINOPHEN LEVEL - Abnormal; Notable for the following components:   Acetaminophen (Tylenol), Serum <10 (*)    All other components within  normal limits  RAPID URINE DRUG SCREEN, HOSP PERFORMED - Abnormal; Notable for the following components:   Opiates POSITIVE (*)    All other components within normal limits  RESP PANEL BY RT-PCR (FLU A&B, COVID) ARPGX2  LIPASE, BLOOD  TROPONIN I (HIGH SENSITIVITY)                                                                                                                          Radiology DG Chest 1 View  Result Date: 03/11/2022 CLINICAL DATA:  Chest pain. EXAM: CHEST  1 VIEW COMPARISON:  Chest radiograph 08/22/2020 FINDINGS: The cardiomediastinal silhouette is within normal limits. The lungs are well inflated. Densities over both lung bases are attributed to overlying soft tissues. No definite airspace consolidation, edema, sizable pleural effusion, or pneumothorax is identified. No acute osseous abnormality is seen. IMPRESSION: No active disease. Electronically Signed   By: Logan Bores M.D.   On: 03/11/2022 17:41   CT Abdomen Pelvis W Contrast  Result Date: 03/11/2022 CLINICAL DATA:  Acute abdominal pain.  History of pancreatitis. EXAM: CT ABDOMEN AND PELVIS WITH CONTRAST TECHNIQUE: Multidetector CT imaging of the abdomen and pelvis was performed using the standard protocol following bolus administration of intravenous contrast. RADIATION DOSE REDUCTION: This exam was performed according to the departmental dose-optimization program which includes automated exposure control, adjustment of the mA and/or kV according to patient size and/or use of iterative reconstruction technique. CONTRAST:  89m OMNIPAQUE IOHEXOL 300 MG/ML  SOLN COMPARISON:  CT abdomen and pelvis 01/12/2022 FINDINGS: Lower chest: No acute abnormality. Hepatobiliary: There is fatty infiltration of the liver. No focal liver lesion. Gallbladder and bile ducts are within normal limits. Pancreas: Unremarkable. No pancreatic ductal dilatation or surrounding inflammatory changes. Spleen: Normal in size without focal abnormality.  Adrenals/Urinary Tract: Limited evaluation of the bladder secondary to streak artifact in the pelvis. The adrenal glands and kidneys are within normal limits. Stomach/Bowel: Stomach is within normal limits. Appendix is not seen. No evidence of bowel wall thickening, distention, or inflammatory changes. Vascular/Lymphatic: Aortic atherosclerosis. No enlarged abdominal or pelvic lymph nodes. Reproductive: Prostate gland is  grossly within normal limits, but not well evaluated secondary to streak artifact in the pelvis. Other: No abdominal wall hernia or abnormality. No abdominopelvic ascites. Musculoskeletal: No acute osseous findings. There are bilateral hip arthroplasties. IMPRESSION: 1. No acute localizing process in the abdomen or pelvis. 2. Fatty infiltration of the liver. Aortic Atherosclerosis (ICD10-I70.0). Electronically Signed   By: Ronney Asters M.D.   On: 03/11/2022 17:31    Pertinent labs & imaging results that were available during my care of the patient were reviewed by me and considered in my medical decision making (see MDM for details).  Medications Ordered in ED Medications  LORazepam (ATIVAN) injection 0-4 mg ( Intravenous See Alternative 03/11/22 1826)    Or  LORazepam (ATIVAN) tablet 0-4 mg (2 mg Oral Given 03/11/22 1826)  LORazepam (ATIVAN) injection 0-4 mg (has no administration in time range)    Or  LORazepam (ATIVAN) tablet 0-4 mg (has no administration in time range)  thiamine (VITAMIN B1) tablet 100 mg (100 mg Oral Given 03/11/22 1826)    Or  thiamine (VITAMIN B1) injection 100 mg ( Intravenous See Alternative 03/11/22 1826)  HYDROcodone-acetaminophen (NORCO/VICODIN) 5-325 MG per tablet 1 tablet (1 tablet Oral Given 03/11/22 1615)  ondansetron (ZOFRAN) injection 4 mg (4 mg Intravenous Given 03/11/22 1615)  iohexol (OMNIPAQUE) 300 MG/ML solution 80 mL (80 mLs Intravenous Contrast Given 03/11/22 1714)                                                                                                                                      Procedures Procedures  (including critical care time)  Medical Decision Making / ED Course   MDM:  56 year old presenting with abdominal pain and suicidality.  Patient overall well-appearing, has chronic blindness.  Some epigastric tenderness on exam.  He reports his symptoms are consistent with chronic pancreatitis.  Given that it is worse currently, will obtain CT scan to further evaluate but low concern that this will demonstrate any acute process.  Will treat symptoms.  Patient reports vague chest pain and shortness of breath which she is currently not experiencing.  Will check single troponin and EKG.  He reports this has been going on for a while.  His chest pain is not exertional, doubt ACS.  Not pleuritic, doubt PE, vital stable with no tachycardia, hypoxia, tachypnea.  No recent travel or surgeries to suggest DVT or PE.  Lungs clear, doubt pneumonia or pneumothorax.  If medical work-up unremarkable, likely will place patient on IVC given increased suicidal thoughts with active plan.  We will also put on CIWA protocol given history of alcohol withdrawals  Clinical Course as of 03/11/22 2226  Thu Mar 11, 2022  2225 Patient will be boarding for psychiatric evaluation. Sitter at bedside. On CIWA. Medically cleared.  [WS]    Clinical Course User Index [WS] Cristie Hem, MD     Additional history obtained: -Additional history obtained  from spouse -External records from outside source obtained and reviewed including: Chart review including previous notes, labs, imaging, consultation notes   Lab Tests: -I ordered, reviewed, and interpreted labs.   The pertinent results include:   Labs Reviewed  COMPREHENSIVE METABOLIC PANEL - Abnormal; Notable for the following components:      Result Value   Sodium 132 (*)    Chloride 95 (*)    Glucose, Bld 119 (*)    AST 72 (*)    All other components within normal limits  CBC WITH  DIFFERENTIAL/PLATELET - Abnormal; Notable for the following components:   Platelets 124 (*)    All other components within normal limits  URINALYSIS, ROUTINE W REFLEX MICROSCOPIC - Abnormal; Notable for the following components:   Hgb urine dipstick LARGE (*)    Protein, ur TRACE (*)    All other components within normal limits  ETHANOL - Abnormal; Notable for the following components:   Alcohol, Ethyl (B) 63 (*)    All other components within normal limits  SALICYLATE LEVEL - Abnormal; Notable for the following components:   Salicylate Lvl <6.0 (*)    All other components within normal limits  ACETAMINOPHEN LEVEL - Abnormal; Notable for the following components:   Acetaminophen (Tylenol), Serum <10 (*)    All other components within normal limits  RAPID URINE DRUG SCREEN, HOSP PERFORMED - Abnormal; Notable for the following components:   Opiates POSITIVE (*)    All other components within normal limits  RESP PANEL BY RT-PCR (FLU A&B, COVID) ARPGX2  LIPASE, BLOOD  TROPONIN I (HIGH SENSITIVITY)      EKG   EKG Interpretation  Date/Time:  Thursday March 11 2022 16:32:10 EDT Ventricular Rate:  77 PR Interval:  136 QRS Duration: 76 QT Interval:  348 QTC Calculation: 393 R Axis:   28 Text Interpretation: Normal sinus rhythm Septal infarct , age undetermined Abnormal ECG Confirmed by Garnette Gunner [phone]) on 03/11/2022 4:34:55 PM         Imaging Studies ordered: I ordered imaging studies including CT a/p, CXR On my interpretation imaging demonstrates no acute process I independently visualized and interpreted imaging. I agree with the radiologist interpretation   Medicines ordered and prescription drug management: Meds ordered this encounter  Medications   HYDROcodone-acetaminophen (NORCO/VICODIN) 5-325 MG per tablet 1 tablet   ondansetron (ZOFRAN) injection 4 mg   iohexol (OMNIPAQUE) 300 MG/ML solution 80 mL   OR Linked Order Group    LORazepam (ATIVAN)  injection 0-4 mg     Order Specific Question:   CIWA-AR < 5 =     Answer:   0 mg     Order Specific Question:   CIWA-AR 5 -10 =     Answer:   1 mg     Order Specific Question:   CIWA-AR 11 -15 =     Answer:   2 mg     Order Specific Question:   CIWA-AR 16 -20 =     Answer:   3 mg     Order Specific Question:   CIWA-AR 16 -20 =     Answer:   Recheck CIWA-AR in 1 hour; if > 20 notify MD     Order Specific Question:   CIWA-AR > 20 =     Answer:   4 mg     Order Specific Question:   CIWA-AR > 20 =     Answer:   Notify MD    LORazepam (ATIVAN) tablet 0-4  mg     Order Specific Question:   CIWA-AR < 5 =     Answer:   0 mg     Order Specific Question:   CIWA-AR 5 -10 =     Answer:   1 mg     Order Specific Question:   CIWA-AR 11 -15 =     Answer:   2 mg     Order Specific Question:   CIWA-AR 16 -20 =     Answer:   3 mg     Order Specific Question:   CIWA-AR 16 -20 =     Answer:   Recheck CIWA-AR in 1 hour; if > 20 notify MD     Order Specific Question:   CIWA-AR > 20 =     Answer:   4 mg     Order Specific Question:   CIWA-AR > 20 =     Answer:   Notify MD   OR Linked Order Group    LORazepam (ATIVAN) injection 0-4 mg     Order Specific Question:   CIWA-AR < 5 =     Answer:   0 mg     Order Specific Question:   CIWA-AR 5 -10 =     Answer:   1 mg     Order Specific Question:   CIWA-AR 11 -15 =     Answer:   2 mg     Order Specific Question:   CIWA-AR 16 -20 =     Answer:   3 mg     Order Specific Question:   CIWA-AR 16 -20 =     Answer:   Recheck CIWA-AR in 1 hour; if > 20 notify MD     Order Specific Question:   CIWA-AR > 20 =     Answer:   4 mg     Order Specific Question:   CIWA-AR > 20 =     Answer:   Notify MD    LORazepam (ATIVAN) tablet 0-4 mg     Order Specific Question:   CIWA-AR < 5 =     Answer:   0 mg     Order Specific Question:   CIWA-AR 5 -10 =     Answer:   1 mg     Order Specific Question:   CIWA-AR 11 -15 =     Answer:   2 mg     Order Specific Question:    CIWA-AR 16 -20 =     Answer:   3 mg     Order Specific Question:   CIWA-AR 16 -20 =     Answer:   Recheck CIWA-AR in 1 hour; if > 20 notify MD     Order Specific Question:   CIWA-AR > 20 =     Answer:   4 mg     Order Specific Question:   CIWA-AR > 20 =     Answer:   Notify MD   OR Linked Order Group    thiamine (VITAMIN B1) tablet 100 mg    thiamine (VITAMIN B1) injection 100 mg    -I have reviewed the patients home medicines and have made adjustments as needed    Cardiac Monitoring: The patient was maintained on a cardiac monitor.  I personally viewed and interpreted the cardiac monitored which showed an underlying rhythm of: NSR  Social Determinants of Health:  Factors impacting patients care include: blindness, alcohol use   Reevaluation: After the interventions noted  above, I reevaluated the patient and found that they have stayed the same  Co morbidities that complicate the patient evaluation  Past Medical History:  Diagnosis Date   Alcohol abuse    Anxiety    Arthritis    Blind in both eyes    sees lights and shadows   Elevated LFTs    Foot and toe(s), blister    bilat feet, blistering between toes   GERD (gastroesophageal reflux disease)    Glaucoma    Glaucoma as birth trauma    Headache(784.0)    Hypertension    hx of   Insomnia    Pancreatitis    UTI (lower urinary tract infection) 08/04/11   finishing abx tomorrow   Weight loss    25 lb wt loss since 08/2010   Wrist fracture, right       Dispostion: Boarding for psychiatric evaluation    Final Clinical Impression(s) / ED Diagnoses Final diagnoses:  Suicidal ideation  Alcohol use disorder     This chart was dictated using voice recognition software.  Despite best efforts to proofread,  errors can occur which can change the documentation meaning.    Cristie Hem, MD 03/11/22 2226

## 2022-03-11 NOTE — ED Notes (Signed)
Pts belongings taken for inventory, changed into purple scrubs, and wanded by security

## 2022-03-11 NOTE — ED Notes (Signed)
This Probation officer has called pt 3x with no response

## 2022-03-12 DIAGNOSIS — R45851 Suicidal ideations: Secondary | ICD-10-CM | POA: Diagnosis not present

## 2022-03-12 DIAGNOSIS — F1010 Alcohol abuse, uncomplicated: Secondary | ICD-10-CM | POA: Diagnosis present

## 2022-03-12 DIAGNOSIS — F1020 Alcohol dependence, uncomplicated: Secondary | ICD-10-CM | POA: Diagnosis present

## 2022-03-12 LAB — RESP PANEL BY RT-PCR (FLU A&B, COVID) ARPGX2
Influenza A by PCR: NEGATIVE
Influenza B by PCR: NEGATIVE
SARS Coronavirus 2 by RT PCR: NEGATIVE

## 2022-03-12 MED ORDER — ACETAMINOPHEN 325 MG PO TABS
650.0000 mg | ORAL_TABLET | Freq: Once | ORAL | Status: AC
  Administered 2022-03-12: 650 mg via ORAL
  Filled 2022-03-12 (×2): qty 2

## 2022-03-12 NOTE — ED Notes (Signed)
Pt complaining of abdominal pain and requesting pain medication. Provider made aware and orders received. Pt offered tylenol and refusing stating "that doesn't work for me". Provider updated. No new orders at this time.

## 2022-03-12 NOTE — Consult Note (Signed)
Telepsych Consultation   Reason for Consult:  psych consult Referring Physician:  Cristie Hem, MD Location of Patient:  [id number] Location of Provider: [facility]  Patient Identification: Sartaj Hoskin [place] MRN:  [id number] Principal Diagnosis: Alcohol use disorder, moderate, dependence ([place]) Diagnosis:  Principal Problem:   Alcohol use disorder, moderate, dependence ([place]) Active Problems:   Alcohol abuse, daily use   Total Time spent with patient: 30 minutes  Subjective:   Zigmund R Beeks is a 56 y.o. male patient admitted with suicidal ideation in the context of alcohol use.   He presents alert and oriented x 3. Older than stated age. Droop over right eye, prominent opacity noted over left eye. Reports reason for admission as, "my stomach and head was hurting and I've been feeling bad. I'm having severe stomach aches and severe headaches. And they aren't helping me so I want to go home". Reports feeling this was x1 week.   Reports 40 year alcohol use history; 1 pint - 1/5 of alcohol in a sitting x2 days/week. Recently drinking daily; last drink was night prior, 1 pint. Reports last meal was x5 days, "because I've been drinking and a lot stress. Even thought I've been hungry I haven't had a appetite". Reports most recent incident of suicidal ideations was "last Friday" in the context of alcohol consumption and intoxication. He denies any active suicidal or homicidal ideations, auditory or visual hallucinations and is currently requesting discharge. Provider discussed relationship of his physical complaints to his continued alcohol consumption; he acknowledged an understanding and verbalized interest in "getting help", however when presented with several area programs such as [facility], [facility] he declined stating "I have bills to pay". TOC consult placed to provide resources in [place].   HPI:   Alfonse R Breeze is a 56 year old male with a past history  significant for alcohol use disorder moderate dependence and related medical sequelae including alcoholic pancreatitis, acute pancreatitis. Per chart review, patient presented to [facility] 03/10/22 and left prior to being seen, 03/03/22 for alcohol intoxication with moderate or severe use disorder, uncomplicated. Reports currently living in [place], work at [employer] in [place]; last went to work Monday but was sent home due to stomach pains from alcohol consumption. Most recent UDS+THC, BAL 63. PDMP, no active prescriptions noted.   Past Psychiatric History: alcohol use disorder moderate or severe. Alcohol intoxication  Risk to Self:   Risk to Others:   Prior Inpatient Therapy:   Prior Outpatient Therapy:    Past Medical History:  Past Medical History:  Diagnosis Date   Alcohol abuse    Anxiety    Arthritis    Blind in both eyes    sees lights and shadows   Elevated LFTs    Foot and toe(s), blister    bilat feet, blistering between toes   GERD (gastroesophageal reflux disease)    Glaucoma    Glaucoma as birth trauma    Headache(784.0)    Hypertension    hx of   Insomnia    Pancreatitis    UTI (lower urinary tract infection) 08/04/11   finishing abx tomorrow   Weight loss    25 lb wt loss since 08/2010   Wrist fracture, right     Past Surgical History:  Procedure Laterality Date   COLONOSCOPY  2006?   ESOPHAGOGASTRODUODENOSCOPY     ESOPHAGOGASTRODUODENOSCOPY (EGD) WITH PROPOFOL N/A 05/09/2017   Procedure: ESOPHAGOGASTRODUODENOSCOPY (EGD) WITH PROPOFOL;  Surgeon: Wilfrid Lund  L III, MD;  Location: [facility];  Service: Gastroenterology;  Laterality: N/A;   EYE SURGERY Left    EYE SURGERY Right    prostetic eye   foot srugery     removal of bone near small toe   FRACTURE SURGERY Right    wrist   HARDWARE REMOVAL  08/06/2011   Procedure: HARDWARE REMOVAL;  Surgeon: Newt Minion, MD;  Location: [facility];  Service: Orthopedics;  Laterality: Right;  Removal Deep Retained  Hardware Right Distal Radius   MULTIPLE TOOTH EXTRACTIONS     for dentures   TOTAL HIP ARTHROPLASTY Left 06/06/2019   Procedure: LEFT TOTAL HIP ARTHROPLASTY-DIRECT ANTERIOR;  Surgeon: [name], MD;  Location: [facility];  Service: Orthopedics;  Laterality: Left;   TOTAL HIP ARTHROPLASTY Right 01/07/2020   Procedure: RIGHT TOTAL HIP ARTHROPLASTY DIRECT ANTERIOR;  Surgeon: [name], MD;  Location: [facility];  Service: Orthopedics;  Laterality: Right;   UPPER GASTROINTESTINAL ENDOSCOPY     Family History:  Family History  Problem Relation Age of Onset   Diabetes Sister    Diabetes Maternal Aunt        aunt and uncles   Hypertension Sister        aunt and uncles maternal side   Colon cancer Neg Hx    Colon polyps Neg Hx    Esophageal cancer Neg Hx    Rectal cancer Neg Hx    Stomach cancer Neg Hx    Family Psychiatric  History: not noted Social History:  Social History   Substance and Sexual Activity  Alcohol Use Yes     Social History   Substance and Sexual Activity  Drug Use Not Currently   Frequency: 7.0 times per week   Types: Marijuana   Comment: smokes marijuana 1 to 2 times daily    Social History   Socioeconomic History   Marital status: Significant Other    Spouse name: Not on file   Number of children: 1   Years of education: Not on file   Highest education level: Not on file  Occupational History   Occupation: disabled  Tobacco Use   Smoking status: Every Day    Types: Cigars   Smokeless tobacco: Never   Tobacco comments:    2-3 cigars a day   Vaping Use   Vaping Use: Never used  Substance and Sexual Activity   Alcohol use: Yes   Drug use: Not Currently    Frequency: 7.0 times per week    Types: Marijuana    Comment: smokes marijuana 1 to 2 times daily   Sexual activity: Not Currently  Other Topics Concern   Not on file  Social History Narrative   Not on file   Social Determinants of Health   Financial Resource Strain: Not on file  Food  Insecurity: Not on file  Transportation Needs: Not on file  Physical Activity: Not on file  Stress: Not on file  Social Connections: Not on file   Additional Social History:   Allergies:   Allergies  Allergen Reactions   Chlorhexidine Rash    Foley D/Ced this AM 01/08/2020    Labs:  Results for orders placed or performed during the hospital encounter of 03/11/22 (from the past 48 hour(s))  Comprehensive metabolic panel     Status: Abnormal   Collection Time: 03/11/22  3:54 PM  Result Value Ref Range   Sodium 132 (L) 135 - 145 mmol/L   Potassium 3.6 3.5 -  5.1 mmol/L   Chloride 95 (L) 98 - 111 mmol/L   CO2 24 22 - 32 mmol/L   Glucose, Bld 119 (H) 70 - 99 mg/dL    Comment: Glucose reference range applies only to samples taken after fasting for at least 8 hours.   BUN 6 6 - 20 mg/dL   Creatinine, Ser 1.12 0.61 - 1.24 mg/dL   Calcium 9.1 8.9 - 10.3 mg/dL   Total Protein 6.9 6.5 - 8.1 g/dL   Albumin 4.3 3.5 - 5.0 g/dL   AST 72 (H) 15 - 41 U/L   ALT 41 0 - 44 U/L   Alkaline Phosphatase 102 38 - 126 U/L   Total Bilirubin 0.8 0.3 - 1.2 mg/dL   GFR, Estimated >60 >60 mL/min    Comment: (NOTE) Calculated using the CKD-EPI Creatinine Equation (2021)    Anion gap 13 5 - 15    Comment: Performed at [facility], [address] [place], [place] [address]  CBC with Differential     Status: Abnormal   Collection Time: 03/11/22  3:54 PM  Result Value Ref Range   WBC 4.2 4.0 - 10.5 K/uL   RBC 4.40 4.22 - 5.81 MIL/uL   Hemoglobin 14.0 13.0 - 17.0 g/dL   HCT 39.6 39.0 - 52.0 %   MCV 90.0 80.0 - 100.0 fL   MCH 31.8 26.0 - 34.0 pg   MCHC 35.4 30.0 - 36.0 g/dL   RDW 13.4 11.5 - 15.5 %   Platelets 124 (L) 150 - 400 K/uL   nRBC 0.0 0.0 - 0.2 %   Neutrophils Relative % 45 %   Neutro Abs 1.9 1.7 - 7.7 K/uL   Lymphocytes Relative 38 %   Lymphs Abs 1.6 0.7 - 4.0 K/uL   Monocytes Relative 16 %   Monocytes Absolute 0.7 0.1 - 1.0 K/uL   Eosinophils Relative 1 %    Eosinophils Absolute 0.0 0.0 - 0.5 K/uL   Basophils Relative 0 %   Basophils Absolute 0.0 0.0 - 0.1 K/uL   Immature Granulocytes 0 %   Abs Immature Granulocytes 0.00 0.00 - 0.07 K/uL    Comment: Performed at [facility], [place], [place] [address]  Lipase, blood     Status: None   Collection Time: 03/11/22  3:54 PM  Result Value Ref Range   Lipase 15 11 - 51 U/L    Comment: Performed at [facility], [address], [place], [place] [address]  Ethanol     Status: Abnormal   Collection Time: 03/11/22  3:54 PM  Result Value Ref Range   Alcohol, Ethyl (B) 63 (H) <10 mg/dL    Comment: (NOTE) Lowest detectable limit for serum alcohol is 10 mg/dL.  For medical purposes only. Performed at [facility], [address], [place], [place] [address]   Salicylate level     Status: Abnormal   Collection Time: 03/11/22  3:54 PM  Result Value Ref Range   Salicylate Lvl <2.9 (L) 7.0 - 30.0 mg/dL    Comment: Performed at [facility], [address], [place], [place] [address]  Acetaminophen level     Status: Abnormal   Collection Time: 03/11/22  3:54 PM  Result Value Ref Range   Acetaminophen (Tylenol), Serum <10 (L) 10 - 30 ug/mL    Comment: (NOTE) Therapeutic concentrations vary significantly. A range of 10-30 ug/mL  may be an effective concentration for many patients. However, some  are best treated  at concentrations outside of this range. Acetaminophen concentrations >150 ug/mL at 4 hours after ingestion  and >50 ug/mL at 12 hours after ingestion are often associated with  toxic reactions.  Performed at [facility], [place], [place] [address]   Troponin I (High Sensitivity)     Status: None   Collection Time: 03/11/22  3:54 PM  Result Value Ref Range   Troponin I (High Sensitivity) <2 <18 ng/L    Comment: (NOTE) Elevated high sensitivity troponin I  (hsTnI) values and significant  changes across serial measurements may suggest ACS but many other  chronic and acute conditions are known to elevate hsTnI results.  Refer to the "Links" section for chest pain algorithms and additional  guidance. Performed at [facility], [address] [place], [place] [address]   Urinalysis, Routine w reflex microscopic Urine, Clean Catch     Status: Abnormal   Collection Time: 03/11/22  6:41 PM  Result Value Ref Range   Color, Urine YELLOW YELLOW   APPearance CLEAR CLEAR   Specific Gravity, Urine 1.028 1.005 - 1.030   pH 5.5 5.0 - 8.0   Glucose, UA NEGATIVE NEGATIVE mg/dL   Hgb urine dipstick LARGE (A) NEGATIVE   Bilirubin Urine NEGATIVE NEGATIVE   Ketones, ur NEGATIVE NEGATIVE mg/dL   Protein, ur TRACE (A) NEGATIVE mg/dL   Nitrite NEGATIVE NEGATIVE   Leukocytes,Ua NEGATIVE NEGATIVE   RBC / HPF 0-5 0 - 5 RBC/hpf   WBC, UA 0-5 0 - 5 WBC/hpf   Mucus PRESENT    Hyaline Casts, UA PRESENT     Comment: Performed at [facility], [address], [place], [place] [address]  Rapid urine drug screen (hospital performed)     Status: Abnormal   Collection Time: 03/11/22  6:41 PM  Result Value Ref Range   Opiates POSITIVE (A) NONE DETECTED   Cocaine NONE DETECTED NONE DETECTED   Benzodiazepines NONE DETECTED NONE DETECTED   Amphetamines NONE DETECTED NONE DETECTED   Tetrahydrocannabinol NONE DETECTED NONE DETECTED   Barbiturates NONE DETECTED NONE DETECTED    Comment: (NOTE) DRUG SCREEN FOR MEDICAL PURPOSES ONLY.  IF CONFIRMATION IS NEEDED FOR ANY PURPOSE, NOTIFY LAB WITHIN 5 DAYS.  LOWEST DETECTABLE LIMITS FOR URINE DRUG SCREEN Drug Class                     Cutoff (ng/mL) Amphetamine and metabolites    1000 Barbiturate and metabolites    200 Benzodiazepine                 099 Tricyclics and metabolites     300 Opiates and metabolites        300 Cocaine and metabolites        300 THC                             50 Performed at [facility], [place], [place] [address]     Medications:  Current Facility-Administered Medications  Medication Dose Route Frequency Provider Last Rate Last Admin   LORazepam (ATIVAN) injection 0-4 mg  0-4 mg Intravenous Q6H Cristie Hem, MD       Or   LORazepam (ATIVAN) tablet 0-4 mg  0-4 mg Oral Q6H Cristie Hem, MD   2 mg at 03/11/22 1826   [START ON 03/14/2022] LORazepam (ATIVAN) injection 0-4 mg  0-4 mg Intravenous Q12H Garnette Gunner  L, MD       Or   [START ON 03/14/2022] LORazepam (ATIVAN) tablet 0-4 mg  0-4 mg Oral Q12H Scheving, Livingston Diones, MD       thiamine (VITAMIN B1) tablet 100 mg  100 mg Oral Daily Cristie Hem, MD   100 mg at 03/11/22 1826   Or   thiamine (VITAMIN B1) injection 100 mg  100 mg Intravenous Daily Cristie Hem, MD       Current Outpatient Medications  Medication Sig Dispense Refill   acetaminophen (TYLENOL) 500 MG tablet Take 1,000 mg by mouth every 6 (six) hours as needed for headache (pain).     busPIRone (BUSPAR) 7.5 MG tablet Take 7.5 mg by mouth 2 (two) times daily.     diclofenac Sodium (VOLTAREN) 1 % GEL Apply 4 g topically 4 (four) times daily as needed (pain).     disulfiram (ANTABUSE) 250 MG tablet Take 250 mg by mouth daily.     escitalopram (LEXAPRO) 10 MG tablet Take 10 mg by mouth daily.     fluticasone (FLONASE) 50 MCG/ACT nasal spray Place 2 sprays into both nostrils daily as needed for allergies.     folic acid (FOLVITE) 1 MG tablet Take 1 tablet (1 mg total) by mouth daily. 30 tablet 1   gabapentin (NEURONTIN) 300 MG capsule Take 300 mg by mouth daily.     hydrOXYzine (ATARAX) 25 MG tablet Take 50 mg by mouth at bedtime.     loperamide (IMODIUM A-D) 2 MG tablet Take 2 mg by mouth See admin instructions. Take one tablet (2 mg) by mouth every morning, may take one tablet (2 mg) in the afternoon as needed for diarrhea     magnesium oxide (MAG-OX) 400 (240  Mg) MG tablet Take 400 mg by mouth 2 (two) times daily.     Multiple Vitamins-Minerals (MULTIVITAMIN ADULTS) TABS Take 1 tablet by mouth daily.     omeprazole (PRILOSEC) 20 MG capsule Take 20 mg by mouth daily.     ondansetron (ZOFRAN) 4 MG tablet Take 1 tablet (4 mg total) by mouth every 6 (six) hours. (Patient taking differently: Take 4 mg by mouth daily as needed for nausea or vomiting.) 12 tablet 0   potassium chloride SA (KLOR-CON M) 20 MEQ tablet Take 1 tablet (20 mEq total) by mouth 2 (two) times daily. (Patient not taking: Reported on 03/04/2022) 6 tablet 0   thiamine 100 MG tablet Take 1 tablet (100 mg total) by mouth daily. 30 tablet 3   traZODone (DESYREL) 100 MG tablet Take 100 mg by mouth at bedtime.     Musculoskeletal: Strength & Muscle Tone: within normal limits Gait & Station:  unable to assess; pt in bed Patient leans: N/A  Psychiatric Specialty Exam:  Presentation  General Appearance:  Casual  Eye Contact: Fair  Speech: Clear and Coherent  Speech Volume: Normal  Handedness: Right   Mood and Affect  Mood: Dysphoric  Affect: Congruent   Thought Process  Thought Processes: Linear; Coherent  Descriptions of Associations:Intact  Orientation:Full (Time, Place and Person)  Thought Content:Logical  History of Schizophrenia/Schizoaffective disorder:No  Duration of Psychotic Symptoms:No data recorded Hallucinations:Hallucinations: None  Ideas of Reference:None  Suicidal Thoughts:Suicidal Thoughts: No  Homicidal Thoughts:Homicidal Thoughts: No   Sensorium  Memory: Immediate Fair; Recent Fair  Judgment: Fair  Insight: Fair   Community education officer  Concentration: Fair  Attention Span: Fair  Recall: [employer] of Knowledge: Fair  Language: Fair   Psychomotor Activity  Psychomotor Activity:Psychomotor Activity: Normal   Assets  Assets: Resilience; Vocational/Educational   Sleep  Sleep:Sleep: Fair    Physical  Exam: Physical Exam Vitals and nursing note reviewed.  HENT:     Head: Normocephalic.  Eyes:     Comments: Right lid droop Left opacity over eye  Cardiovascular:     Rate and Rhythm: Normal rate.  Pulmonary:     Effort: Pulmonary effort is normal.  Abdominal:     Comments: C/o pain  Skin:    General: Skin is dry.  Neurological:     General: No focal deficit present.     Mental Status: He is alert.  Psychiatric:        Attention and Perception: Attention and perception normal.        Mood and Affect: Affect is blunt.        Speech: Speech normal.        Behavior: Behavior is cooperative.        Thought Content: Thought content is not paranoid or delusional. Thought content does not include homicidal or suicidal ideation. Thought content does not include homicidal or suicidal plan.        Cognition and Memory: Cognition and memory normal.        Judgment: Judgment normal.    Review of Systems  Psychiatric/Behavioral:  Positive for substance abuse.   All other systems reviewed and are negative.  Blood pressure 124/84, pulse 77, temperature 98.4 F (36.9 C), temperature source Oral, resp. rate 13, height '5\' 7"'$  (1.702 m), weight 63.5 kg, SpO2 97 %. Body mass index is 21.93 kg/m.  Treatment Plan Summary: Daily contact with patient to assess and evaluate symptoms and progress in treatment, Medication management, and Plan Patient denies any suicidal or homicidal ideations. Presents logical and linear. Denies any auditory or visual hallucinations and contracts for safety. Patient to be psychiatrically cleared.   Disposition: No evidence of imminent risk to self or others at present.   Patient does not meet criteria for psychiatric inpatient admission. Supportive therapy provided about ongoing stressors. Discussed crisis plan, support from social network, calling 911, coming to the Emergency Department, and calling Suicide Hotline. TOC consult placed for substance abuse resources  for individual follow up.   This service was provided via telemedicine using a 2-way, interactive audio and video technology.  Names of all persons participating in this telemedicine service and their role in this encounter. Name: Oneida Alar Role: PMHNP  Name: Hampton Abbot Role: Attending MD  Name: Blain Pais Carvin Role: patient  Name:  Role:     Inda Merlin, NP 03/12/2022 10:15 AM

## 2022-03-12 NOTE — ED Notes (Addendum)
Per [facility]irtland Bouchard, [facility], "Evette Georges, NP, recommends overnight observation for safety and stabilization with psych reassessment in the AM."

## 2022-03-12 NOTE — ED Notes (Signed)
TTS assessment

## 2022-03-12 NOTE — ED Provider Notes (Signed)
Emergency Medicine Observation Re-evaluation Note  Austin [name] is a 56 y.o. male, seen on rounds today.  Pt initially presented to the ED for complaints of Abdominal Pain and Suicidal Currently, the patient is asleep resting comfortably.  Physical Exam  BP 119/68   Pulse 84   Temp 98.4 F (36.9 C) (Oral)   Resp 16   Ht '5\' 7"'$  (1.702 m)   Wt 63.5 kg   SpO2 99%   BMI 21.93 kg/m  Physical Exam General: Asleep, no acute distress Cardiac: Regular rate Lungs: No increased work of breathing Psych: Calm, asleep  ED Course / MDM  EKG:EKG Interpretation  Date/Time:  Thursday March 11 2022 16:32:10 EDT Ventricular Rate:  77 PR Interval:  136 QRS Duration: 76 QT Interval:  348 QTC Calculation: 393 R Axis:   28 Text Interpretation: Normal sinus rhythm Septal infarct , age undetermined Abnormal ECG Confirmed by Garnette Gunner [phone]) on 03/11/2022 4:34:55 PM  I have reviewed the labs performed to date as well as medications administered while in observation.  Recent changes in the last 24 hours include patient was medically cleared.  He was evaluated by TTS and psych who recommended overnight observation with reassessment this morning.  Plan  Current plan is for psych recommendations for disposition.    Kemper Durie, DO 03/12/22 [phone]
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
## 2022-03-12 NOTE — ED Notes (Signed)
TTS reports pt is psych cleared with TOC resources

## 2022-03-12 NOTE — Discharge Instructions (Signed)
You were seen in the emergency department for your abdominal pain and your suicidal thoughts.  Your work-up showed no signs of pancreatitis or obvious cause of your abdominal pain and it could be related to gastritis from your alcohol use.  You can take Tylenol or use antacid medication to help with the pain.  You can follow-up with your primary doctor to have your abdominal pain rechecked.  You were evaluated by psychiatry for your suicidal ideation and are cleared for outpatient management.  You can follow-up at the behavioral health urgent care for further management of your suicidal thoughts and if you need more resources for your alcohol use disorder.  You should return to the emergency department if you have significantly worsening pain, repetitive vomiting, worsening thoughts or plan to hurt or kill yourself or others, or if you do have any other new or concerning symptoms.

## 2022-03-12 NOTE — ED Notes (Signed)
Pt follows commands and has a calm dispostion

## 2022-03-12 NOTE — ED Notes (Signed)
Patient verbalizes understanding of discharge instructions. Opportunity for questioning and answers were provided. Patient discharged from ED with resources. Pt given food and taxi voucher. Return precautions given.

## 2022-03-15 ENCOUNTER — Emergency Department (HOSPITAL_COMMUNITY)
Admission: EM | Admit: 2022-03-15 | Discharge: 2022-03-15 | Disposition: A | Discharge status: Home / Self Care | Payer: Medicare HMO | Attending: Emergency Medicine | Admitting: Emergency Medicine

## 2022-03-15 ENCOUNTER — Emergency Department (HOSPITAL_COMMUNITY): Payer: Medicare HMO

## 2022-03-15 DIAGNOSIS — F10920 Alcohol use, unspecified with intoxication, uncomplicated: Secondary | ICD-10-CM

## 2022-03-15 DIAGNOSIS — S0081XA Abrasion of other part of head, initial encounter: Secondary | ICD-10-CM | POA: Diagnosis not present

## 2022-03-15 DIAGNOSIS — X58XXXA Exposure to other specified factors, initial encounter: Secondary | ICD-10-CM | POA: Insufficient documentation

## 2022-03-15 DIAGNOSIS — S0990XA Unspecified injury of head, initial encounter: Secondary | ICD-10-CM | POA: Diagnosis present

## 2022-03-15 DIAGNOSIS — F10929 Alcohol use, unspecified with intoxication, unspecified: Secondary | ICD-10-CM | POA: Insufficient documentation

## 2022-03-15 NOTE — ED Triage Notes (Signed)
Pt BIB EMS from home for alcohol intoxication.Pt was found laying on the floor, altered. Pt reportedly drank about a pint of vodka.

## 2022-03-15 NOTE — ED Notes (Signed)
Called pt brother to let him know pt is being discharged, no answer x 2

## 2022-03-15 NOTE — Discharge Instructions (Signed)
Please follow-up with your family doctor in the office.  Please return for worsening confusion vomiting headache.

## 2022-03-15 NOTE — ED Notes (Signed)
Pt brother, Patrick Jupiter, at bedside. Brother agrees to take pt home after head CT results. Pt agreed to have VS and head CT.
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
## 2022-03-15 NOTE — ED Notes (Signed)
Pt refused to have vitals signs done, repeatedly yells "I want to go home". Pt has called his brother twice, and brother is not ready to pick him up yet.

## 2022-04-16 ENCOUNTER — Ambulatory Visit: Payer: Medicare HMO | Admitting: Podiatry

## 2022-05-08 ENCOUNTER — Emergency Department (HOSPITAL_COMMUNITY)
Admission: EM | Admit: 2022-05-08 | Discharge: 2022-05-08 | Disposition: A | Discharge status: Home / Self Care | Payer: Medicare HMO | Attending: Emergency Medicine | Admitting: Emergency Medicine

## 2022-05-08 ENCOUNTER — Ambulatory Visit (HOSPITAL_COMMUNITY)
Admission: EM | Admit: 2022-05-08 | Discharge: 2022-05-08 | Disposition: A | Discharge status: Home / Self Care | Payer: Medicare HMO

## 2022-05-08 ENCOUNTER — Other Ambulatory Visit: Payer: Self-pay

## 2022-05-08 DIAGNOSIS — R5383 Other fatigue: Secondary | ICD-10-CM | POA: Diagnosis not present

## 2022-05-08 DIAGNOSIS — R112 Nausea with vomiting, unspecified: Secondary | ICD-10-CM | POA: Diagnosis present

## 2022-05-08 DIAGNOSIS — F17290 Nicotine dependence, other tobacco product, uncomplicated: Secondary | ICD-10-CM | POA: Insufficient documentation

## 2022-05-08 DIAGNOSIS — Z1152 Encounter for screening for COVID-19: Secondary | ICD-10-CM | POA: Diagnosis not present

## 2022-05-08 LAB — COMPREHENSIVE METABOLIC PANEL
ALT: 35 U/L (ref 0–44)
AST: 45 U/L — ABNORMAL HIGH (ref 15–41)
Albumin: 3.7 g/dL (ref 3.5–5.0)
Alkaline Phosphatase: 75 U/L (ref 38–126)
Anion gap: 12 (ref 5–15)
BUN: 12 mg/dL (ref 6–20)
CO2: 25 mmol/L (ref 22–32)
Calcium: 8.9 mg/dL (ref 8.9–10.3)
Chloride: 103 mmol/L (ref 98–111)
Creatinine, Ser: 1 mg/dL (ref 0.61–1.24)
GFR, Estimated: >60 mL/min (ref >60)
Glucose, Bld: 92 mg/dL (ref 70–99)
Potassium: 3.8 mmol/L (ref 3.5–5.1)
Sodium: 140 mmol/L (ref 135–145)
Total Bilirubin: 0.7 mg/dL (ref 0.3–1.2)
Total Protein: 6.1 g/dL — ABNORMAL LOW (ref 6.5–8.1)

## 2022-05-08 LAB — COOXEMETRY PANEL
Carboxyhemoglobin: 4.1 % — ABNORMAL HIGH (ref 0.5–1.5)
Methemoglobin: 0.7 % (ref 0.0–1.5)
O2 Saturation: 56.7 %
Total hemoglobin: 13.5 g/dL (ref 12.0–16.0)

## 2022-05-08 LAB — CBC
HCT: 36.9 % — ABNORMAL LOW (ref 39.0–52.0)
Hemoglobin: 13 g/dL (ref 13.0–17.0)
MCH: 33.4 pg (ref 26.0–34.0)
MCHC: 35.2 g/dL (ref 30.0–36.0)
MCV: 94.9 fL (ref 80.0–100.0)
Platelets: 89 10*3/uL — ABNORMAL LOW (ref 150–400)
RBC: 3.89 MIL/uL — ABNORMAL LOW (ref 4.22–5.81)
RDW: 13.8 % (ref 11.5–15.5)
WBC: 4.2 10*3/uL (ref 4.0–10.5)
nRBC: 0 % (ref 0.0–0.2)

## 2022-05-08 LAB — RESP PANEL BY RT-PCR (FLU A&B, COVID) ARPGX2
Influenza A by PCR: NEGATIVE
Influenza B by PCR: NEGATIVE
SARS Coronavirus 2 by RT PCR: NEGATIVE

## 2022-05-08 MED ORDER — KETOROLAC TROMETHAMINE 30 MG/ML IJ SOLN
30.0000 mg | Freq: Once | INTRAMUSCULAR | Status: AC
  Administered 2022-05-08: 30 mg via INTRAVENOUS
  Filled 2022-05-08: qty 1

## 2022-05-08 MED ORDER — ONDANSETRON 4 MG PO TBDP: 4.0000 mg | ORAL_TABLET | Freq: Three times a day (TID) | ORAL | 0 refills | Status: DC | PRN

## 2022-05-08 MED ORDER — DICYCLOMINE HCL 20 MG PO TABS: 20.0000 mg | ORAL_TABLET | Freq: Two times a day (BID) | ORAL | 0 refills | Status: DC | PRN

## 2022-05-08 MED ORDER — SODIUM CHLORIDE 0.9 % IV BOLUS
1000.0000 mL | Freq: Once | INTRAVENOUS | Status: AC
  Administered 2022-05-08: 1000 mL via INTRAVENOUS

## 2022-05-08 MED ORDER — DICYCLOMINE HCL 10 MG/ML IM SOLN
20.0000 mg | Freq: Once | INTRAMUSCULAR | Status: AC
  Administered 2022-05-08: 20 mg via INTRAMUSCULAR
  Filled 2022-05-08: qty 2

## 2022-05-08 NOTE — ED Notes (Signed)
Needs assistance due to blindness

## 2022-05-08 NOTE — ED Notes (Signed)
Patient is being discharged from the Urgent Care and sent to the Emergency Department via POV . Per Dr Mannie Stabile, patient is in need of higher level of care due to complications a limited resources related to natural gas exposure. Patient is aware and verbalizes understanding of plan of care.  Vitals:   05/08/22 1700  BP: (!) 140/82  Pulse: 68  Resp: 18  Temp: 98.7 F (37.1 C)  SpO2: 99%

## 2022-05-08 NOTE — ED Notes (Signed)
RN reviewed discharge instructions with pt. Pt verbalized understanding and had no further questions. VSS upon discharge.  

## 2022-05-08 NOTE — ED Provider Notes (Signed)
[facility] EMERGENCY DEPARTMENT Provider Note   CSN: [id number] Arrival date & time: 05/08/22  1740     History  Chief Complaint  Patient presents with   Toxic Inhalation    Austin [name] is a 56 y.o. male presenting to ED with multiple complaints.  The patient reports that she has had nausea, vomiting, headache, diarrhea, chills, muscle aches ongoing for about 2 weeks.  He reports he had a funny smell in the house and called the gas company who came out of the house reported that he had a "gas leak with really high levels of gas" in his house, and a gas leak was turned off 4 days ago.  Patient reports that he continues to have the same symptoms of headaches, muscle aches and weakness.  He reports his symptoms are persistent whether he is in the house or at work or outside the house.  He also lives his girlfriend who had very similar symptoms.  She is also a patient in the ED.  HPI     Home Medications Prior to Admission medications   Medication Sig Start Date End Date Taking? Authorizing Provider  dicyclomine (BENTYL) 20 MG tablet Take 1 tablet (20 mg total) by mouth 2 (two) times daily as needed for up to 20 doses for spasms. 05/08/22  Yes Wyvonnia Dusky, MD  ondansetron (ZOFRAN-ODT) 4 MG disintegrating tablet Take 1 tablet (4 mg total) by mouth every 8 (eight) hours as needed for up to 12 doses for nausea or vomiting. 05/08/22  Yes Ravonda Brecheen, Carola Rhine, MD  acetaminophen (TYLENOL) 500 MG tablet Take 1,000 mg by mouth every 6 (six) hours as needed for headache (pain).    [provider]  busPIRone (BUSPAR) 7.5 MG tablet Take 7.5 mg by mouth 2 (two) times daily. 07/31/21   [provider]  diclofenac Sodium (VOLTAREN) 1 % GEL Apply 4 g topically 4 (four) times daily as needed (pain). 08/10/21   [provider]  disulfiram (ANTABUSE) 250 MG tablet Take 250 mg by mouth daily.    [provider]  escitalopram (LEXAPRO) 10 MG tablet Take 10  mg by mouth daily.    [provider]  fluticasone (FLONASE) 50 MCG/ACT nasal spray Place 2 sprays into both nostrils daily as needed for allergies.    [provider]  folic acid (FOLVITE) 1 MG tablet Take 1 tablet (1 mg total) by mouth daily. 09/11/21   Oswald Hillock, MD  gabapentin (NEURONTIN) 300 MG capsule Take 300 mg by mouth daily.    [provider]  hydrOXYzine (ATARAX) 25 MG tablet Take 50 mg by mouth at bedtime. 09/05/21   [provider]  loperamide (IMODIUM A-D) 2 MG tablet Take 2 mg by mouth See admin instructions. Take one tablet (2 mg) by mouth every morning, may take one tablet (2 mg) in the afternoon as needed for diarrhea 07/04/20   [provider]  magnesium oxide (MAG-OX) 400 (240 Mg) MG tablet Take 400 mg by mouth 2 (two) times daily.    [provider]  Multiple Vitamins-Minerals (MULTIVITAMIN ADULTS) TABS Take 1 tablet by mouth daily.    [provider]  omeprazole (PRILOSEC) 20 MG capsule Take 20 mg by mouth daily.    [provider]  ondansetron (ZOFRAN) 4 MG tablet Take 1 tablet (4 mg total) by mouth every 6 (six) hours. Patient taking differently: Take 4 mg by mouth daily as needed for nausea or vomiting. 02/17/21  Meccariello, Bernita Raisin, MD  potassium chloride SA (KLOR-CON M) 20 MEQ tablet Take 1 tablet (20 mEq total) by mouth 2 (two) times daily. Patient not taking: Reported on 03/04/2022 0/0/86   Prince Rome, PA-C  thiamine 100 MG tablet Take 1 tablet (100 mg total) by mouth daily. 09/11/21   Oswald Hillock, MD  traZODone (DESYREL) 100 MG tablet Take 100 mg by mouth at bedtime.    [provider]      Allergies    Chlorhexidine    Review of Systems   Review of Systems  Physical Exam Updated Vital Signs BP (!) 153/99 (BP Location: Right Arm)   Pulse 68   Temp 99.4 F (37.4 C) (Oral)   Resp 18   SpO2 100%  Physical Exam Constitutional:      General: He is not in acute  distress. HENT:     Head: Normocephalic and atraumatic.  Eyes:     Conjunctiva/sclera: Conjunctivae normal.     Pupils: Pupils are equal, round, and reactive to light.  Cardiovascular:     Rate and Rhythm: Normal rate and regular rhythm.  Pulmonary:     Effort: Pulmonary effort is normal. No respiratory distress.  Abdominal:     General: There is no distension.     Tenderness: There is no abdominal tenderness.  Skin:    General: Skin is warm and dry.  Neurological:     General: No focal deficit present.     Mental Status: He is alert. Mental status is at baseline.  Psychiatric:        Mood and Affect: Mood normal.        Behavior: Behavior normal.     ED Results / Procedures / Treatments   Labs (all labs ordered are listed, but only abnormal results are displayed) Labs Reviewed  COOXEMETRY PANEL - Abnormal; Notable for the following components:      Result Value   Carboxyhemoglobin 4.1 (*)    All other components within normal limits  CBC - Abnormal; Notable for the following components:   RBC 3.89 (*)    HCT 36.9 (*)    Platelets 89 (*)    All other components within normal limits  COMPREHENSIVE METABOLIC PANEL - Abnormal; Notable for the following components:   Total Protein 6.1 (*)    AST 45 (*)    All other components within normal limits  RESP PANEL BY RT-PCR (FLU A&B, COVID) [id number]    EKG None  Radiology No results found.  Procedures Procedures    Medications Ordered in ED Medications  sodium chloride 0.9 % bolus 1,000 mL (1,000 mLs Intravenous New Bag/Given 05/08/22 2149)  ketorolac (TORADOL) 30 MG/ML injection 30 mg (30 mg Intravenous Given 05/08/22 2147)  dicyclomine (BENTYL) injection 20 mg (20 mg Intramuscular Given 05/08/22 2145)    ED Course/ Medical Decision Making/ A&P                           Medical Decision Making Risk Prescription drug management.   This patient presents to the ED with concern for muscle aches, headache, diarrhea,  nausea vomiting. This involves an extensive number of treatment options, and is a complaint that carries with it a high risk of complications and morbidity.  The differential diagnosis includes viral illness versus carbon oxide poisoning versus other gas borne illness versus other  Patient is well-appearing on exam, is not hypoxic, does not appear cyanotic.  He  reports that since the gas company turned out the gas he has not had any further alarming in the house.  I suspect the source of the leak has been controlled.  Also find it unusual he is having persistent symptoms, including when he is outside the house or at work, and I wonder whether there may be another cause that is not related to carbon oxide or gas poisoning, particularly given his GI symptoms with diarrhea, and also muscle aches and chills.  This sounds more like a viral illness.  We will check for COVID and flu.  I ordered and personally interpreted labs.  The pertinent results include:  no significant findings; carbooxyhemoglobin levels mildly elevated but the patient is a cigar smoker, this is not significantly elevated, likely chronic baseline level.   I ordered medication including IV fluids, IV Toradol, Bentyl for body aches and pains, hydration, and abdominal cramping.  I have reviewed the patients home medicines and have made adjustments as needed  Test Considered: I have a low suspicion for sepsis at this time.  I doubt meningitis.  I do not see an indication for emergent lumbar puncture   After the interventions noted above, I reevaluated the patient and found that they have: improved  Social Determinants of Health: advised the patient to follow-up with his gas company, ensure that the alarms are working in his house, and to leave the house if again the alarms are going off or if he detects or smells gas again.  He verbalized understanding.  Dispostion:  After consideration of the diagnostic results and the patients  response to treatment, I feel that the patent would benefit from outpatient PCP follow-up.         Final Clinical Impression(s) / ED Diagnoses Final diagnoses:  Other fatigue    Rx / DC Orders ED Discharge Orders          Ordered    dicyclomine (BENTYL) 20 MG tablet  2 times daily PRN        05/08/22 2318    ondansetron (ZOFRAN-ODT) 4 MG disintegrating tablet  Every 8 hours PRN        05/08/22 2318              Wyvonnia Dusky, MD 05/08/22 2319

## 2022-05-08 NOTE — ED Notes (Signed)
Patient reports he has been living in his home and found out on Monday, 05/03/2022 that there was natural gas leak .    The technician turned it off gas and since then has had headache, nausea, diarrhea, weakness, and hot flashes.  Family encouraged patient to come to [facility].    Dr hagler spoke to patient in intake room.  Patient is calling for a ride.

## 2022-05-08 NOTE — ED Provider Triage Note (Signed)
Emergency Medicine Provider Triage Evaluation Note  Austin [name] , a 56 y.o. male  was evaluated in triage.  Pt complains of toxic inhalation. States that for 2 weeks he was having symptoms of nausea, vomiting, diarrhea, headaches, and weakness. His girlfriend who he lives with is here sick with similar symptoms. States that on Monday he smelled an unusual odor and called the gas company who confirmed there was a leak. States that they turned the gas off at that time, however their symptoms have not improved.   Review of Systems  Positive:  Negative:   Physical Exam  BP (!) 141/101 (BP Location: Right Arm)   Pulse 66   Temp 99.4 F (37.4 C) (Oral)   Resp 15   SpO2 98%  Gen:   Awake, no distress   Resp:  Normal effort  MSK:   Moves extremities without difficulty  Other:    Medical Decision Making  Medically screening exam initiated at 6:33 PM.  Appropriate orders placed.  Austin [name] was informed that the remainder of the evaluation will be completed by another provider, this initial triage assessment does not replace that evaluation, and the importance of remaining in the ED until their evaluation is complete.     Nestor Lewandowsky 05/08/22 1835

## 2022-05-08 NOTE — ED Triage Notes (Signed)
Pt presents stating their gas alarm had been going off for about 2 weeks but he and his gf were unsure what the noise was . On Monday after being sick with n/v/d and headache [employer] came out and found a leak and promptly turned off the gas and advised them to seek medical care but they declined.  Symptoms have continued and don't seem to be improving.

## 2022-05-08 NOTE — Discharge Instructions (Addendum)
Please make sure that you have a functioning and working carbon monoxide detector in the house.  He should check again with your gas company if there are any further concerns for gas leak in the house.  Gas leaks can be very dangerous and can lead to sudden death, headaches, confusion, and vomiting.

## 2022-05-18 ENCOUNTER — Other Ambulatory Visit: Payer: Self-pay

## 2022-05-18 ENCOUNTER — Emergency Department (HOSPITAL_COMMUNITY)
Admission: EM | Admit: 2022-05-18 | Discharge: 2022-05-18 | Discharge status: Against Medical Advice | Payer: Medicare HMO | Attending: Physician Assistant | Admitting: Physician Assistant

## 2022-05-18 DIAGNOSIS — Y905 Blood alcohol level of 100-119 mg/100 ml: Secondary | ICD-10-CM | POA: Insufficient documentation

## 2022-05-18 DIAGNOSIS — F1210 Cannabis abuse, uncomplicated: Secondary | ICD-10-CM | POA: Diagnosis not present

## 2022-05-18 DIAGNOSIS — Z5321 Procedure and treatment not carried out due to patient leaving prior to being seen by health care provider: Secondary | ICD-10-CM | POA: Insufficient documentation

## 2022-05-18 DIAGNOSIS — F419 Anxiety disorder, unspecified: Secondary | ICD-10-CM | POA: Diagnosis not present

## 2022-05-18 DIAGNOSIS — F10130 Alcohol abuse with withdrawal, uncomplicated: Secondary | ICD-10-CM | POA: Insufficient documentation

## 2022-05-18 LAB — CBC WITH DIFFERENTIAL/PLATELET
Abs Immature Granulocytes: 0 10*3/uL (ref 0.00–0.07)
Basophils Absolute: 0 10*3/uL (ref 0.0–0.1)
Basophils Relative: 1 %
Eosinophils Absolute: 0 10*3/uL (ref 0.0–0.5)
Eosinophils Relative: 1 %
HCT: 42.8 % (ref 39.0–52.0)
Hemoglobin: 15.8 g/dL (ref 13.0–17.0)
Immature Granulocytes: 0 %
Lymphocytes Relative: 59 %
Lymphs Abs: 1 10*3/uL (ref 0.7–4.0)
MCH: 32.8 pg (ref 26.0–34.0)
MCHC: 36.9 g/dL — ABNORMAL HIGH (ref 30.0–36.0)
MCV: 89 fL (ref 80.0–100.0)
Monocytes Absolute: 0.2 10*3/uL (ref 0.1–1.0)
Monocytes Relative: 13 %
Neutro Abs: 0.4 10*3/uL — CL (ref 1.7–7.7)
Neutrophils Relative %: 26 %
Platelets: 112 10*3/uL — ABNORMAL LOW (ref 150–400)
RBC: 4.81 MIL/uL (ref 4.22–5.81)
RDW: 12.5 % (ref 11.5–15.5)
WBC: 1.7 10*3/uL — ABNORMAL LOW (ref 4.0–10.5)
nRBC: 0 % (ref 0.0–0.2)

## 2022-05-18 LAB — COMPREHENSIVE METABOLIC PANEL
ALT: 255 U/L — ABNORMAL HIGH (ref 0–44)
AST: 637 U/L — ABNORMAL HIGH (ref 15–41)
Albumin: 3.8 g/dL (ref 3.5–5.0)
Alkaline Phosphatase: 102 U/L (ref 38–126)
Anion gap: 11 (ref 5–15)
BUN: <5 mg/dL — ABNORMAL LOW (ref 6–20)
CO2: 26 mmol/L (ref 22–32)
Calcium: 8.8 mg/dL — ABNORMAL LOW (ref 8.9–10.3)
Chloride: 97 mmol/L — ABNORMAL LOW (ref 98–111)
Creatinine, Ser: 0.91 mg/dL (ref 0.61–1.24)
GFR, Estimated: >60 mL/min (ref >60)
Glucose, Bld: 127 mg/dL — ABNORMAL HIGH (ref 70–99)
Potassium: 3.3 mmol/L — ABNORMAL LOW (ref 3.5–5.1)
Sodium: 134 mmol/L — ABNORMAL LOW (ref 135–145)
Total Bilirubin: 0.7 mg/dL (ref 0.3–1.2)
Total Protein: 6.7 g/dL (ref 6.5–8.1)

## 2022-05-18 LAB — RAPID URINE DRUG SCREEN, HOSP PERFORMED
Amphetamines: NOT DETECTED
Barbiturates: NOT DETECTED
Benzodiazepines: NOT DETECTED
Cocaine: NOT DETECTED
Opiates: NOT DETECTED
Tetrahydrocannabinol: POSITIVE — AB

## 2022-05-18 LAB — ETHANOL: Alcohol, Ethyl (B): 105 mg/dL — ABNORMAL HIGH (ref <10)

## 2022-05-18 NOTE — ED Provider Triage Note (Signed)
Emergency Medicine Provider Triage Evaluation Note  Austin [name] , a 56 y.o. male  was evaluated in triage.  Pt complains of alcohol detox.  Last drink at 10 AM because  he felt that his withdrawals were getting bad. Prior hospitalization due to withdrawals.   Review of Systems  Positive: anxiety Negative: Fever, nausea, vomiting  Physical Exam  There were no vitals taken for this visit. Gen:   Awake, no distress   Resp:  Normal effort  MSK:   Moves extremities without difficulty  Other:  No signs of withdrawals   Medical Decision Making  Medically screening exam initiated at 12:34 PM.  Appropriate orders placed.  Austin [name] was informed that the remainder of the evaluation will be completed by another provider, this initial triage assessment does not replace that evaluation, and the importance of remaining in the ED until their evaluation is complete.     Janeece Fitting, PA-C 05/18/22 1248

## 2022-05-18 NOTE — ED Triage Notes (Addendum)
Pt to ED c/o alcohol abuse, reports "off and on" drinking for the last couple years. Reports last drink at 10 am; requesting detox. No obvious s/s of withdrawal in triage

## 2022-05-18 NOTE — ED Notes (Signed)
Pt called for a room, no answer, not seen in the lobby

## 2022-06-02 ENCOUNTER — Ambulatory Visit: Payer: Medicare HMO | Admitting: Podiatry

## 2022-06-29 ENCOUNTER — Other Ambulatory Visit: Payer: Self-pay

## 2022-06-29 ENCOUNTER — Emergency Department (HOSPITAL_COMMUNITY)
Admission: EM | Admit: 2022-06-29 | Discharge: 2022-06-30 | Disposition: A | Discharge status: Home / Self Care | Payer: Medicare Other | Attending: Emergency Medicine | Admitting: Emergency Medicine

## 2022-06-29 ENCOUNTER — Encounter (HOSPITAL_COMMUNITY): Payer: Self-pay

## 2022-06-29 ENCOUNTER — Emergency Department (HOSPITAL_COMMUNITY): Payer: Medicare Other

## 2022-06-29 DIAGNOSIS — R7989 Other specified abnormal findings of blood chemistry: Secondary | ICD-10-CM | POA: Insufficient documentation

## 2022-06-29 DIAGNOSIS — F1090 Alcohol use, unspecified, uncomplicated: Secondary | ICD-10-CM | POA: Diagnosis present

## 2022-06-29 DIAGNOSIS — R45851 Suicidal ideations: Secondary | ICD-10-CM | POA: Insufficient documentation

## 2022-06-29 DIAGNOSIS — H547 Unspecified visual loss: Secondary | ICD-10-CM | POA: Diagnosis not present

## 2022-06-29 DIAGNOSIS — F1094 Alcohol use, unspecified with alcohol-induced mood disorder: Secondary | ICD-10-CM | POA: Diagnosis not present

## 2022-06-29 DIAGNOSIS — R0789 Other chest pain: Secondary | ICD-10-CM | POA: Diagnosis not present

## 2022-06-29 DIAGNOSIS — Z79899 Other long term (current) drug therapy: Secondary | ICD-10-CM | POA: Insufficient documentation

## 2022-06-29 DIAGNOSIS — Y908 Blood alcohol level of 240 mg/100 ml or more: Secondary | ICD-10-CM | POA: Diagnosis not present

## 2022-06-29 DIAGNOSIS — Z789 Other specified health status: Secondary | ICD-10-CM | POA: Diagnosis present

## 2022-06-29 DIAGNOSIS — H543 Unqualified visual loss, both eyes: Secondary | ICD-10-CM | POA: Diagnosis present

## 2022-06-29 LAB — CBC WITH DIFFERENTIAL/PLATELET
Abs Immature Granulocytes: 0 10*3/uL (ref 0.00–0.07)
Basophils Absolute: 0 10*3/uL (ref 0.0–0.1)
Basophils Relative: 0 %
Eosinophils Absolute: 0 10*3/uL (ref 0.0–0.5)
Eosinophils Relative: 1 %
HCT: 39.4 % (ref 39.0–52.0)
Hemoglobin: 14.2 g/dL (ref 13.0–17.0)
Lymphocytes Relative: 51 %
Lymphs Abs: 1.6 10*3/uL (ref 0.7–4.0)
MCH: 32.8 pg (ref 26.0–34.0)
MCHC: 36 g/dL (ref 30.0–36.0)
MCV: 91 fL (ref 80.0–100.0)
Monocytes Absolute: 0.1 10*3/uL (ref 0.1–1.0)
Monocytes Relative: 4 %
Neutro Abs: 1.4 10*3/uL — ABNORMAL LOW (ref 1.7–7.7)
Neutrophils Relative %: 44 %
Platelets: 125 10*3/uL — ABNORMAL LOW (ref 150–400)
RBC: 4.33 MIL/uL (ref 4.22–5.81)
RDW: 13.2 % (ref 11.5–15.5)
WBC: 3.2 10*3/uL — ABNORMAL LOW (ref 4.0–10.5)
nRBC: 0 % (ref 0.0–0.2)
nRBC: 0 /100 WBC

## 2022-06-29 LAB — COMPREHENSIVE METABOLIC PANEL
ALT: 191 U/L — ABNORMAL HIGH (ref 0–44)
AST: 207 U/L — ABNORMAL HIGH (ref 15–41)
Albumin: 3.8 g/dL (ref 3.5–5.0)
Alkaline Phosphatase: 138 U/L — ABNORMAL HIGH (ref 38–126)
Anion gap: 11 (ref 5–15)
BUN: 9 mg/dL (ref 6–20)
CO2: 28 mmol/L (ref 22–32)
Calcium: 8.5 mg/dL — ABNORMAL LOW (ref 8.9–10.3)
Chloride: 99 mmol/L (ref 98–111)
Creatinine, Ser: 0.99 mg/dL (ref 0.61–1.24)
GFR, Estimated: >60 mL/min (ref >60)
Glucose, Bld: 81 mg/dL (ref 70–99)
Potassium: 3.9 mmol/L (ref 3.5–5.1)
Sodium: 138 mmol/L (ref 135–145)
Total Bilirubin: 0.4 mg/dL (ref 0.3–1.2)
Total Protein: 6.7 g/dL (ref 6.5–8.1)

## 2022-06-29 LAB — SALICYLATE LEVEL: Salicylate Lvl: <7 mg/dL — ABNORMAL LOW (ref 7.0–30.0)

## 2022-06-29 LAB — ETHANOL: Alcohol, Ethyl (B): 251 mg/dL — ABNORMAL HIGH (ref <10)

## 2022-06-29 LAB — ACETAMINOPHEN LEVEL: Acetaminophen (Tylenol), Serum: <10 ug/mL — ABNORMAL LOW (ref 10–30)

## 2022-06-29 LAB — TROPONIN I (HIGH SENSITIVITY)
Troponin I (High Sensitivity): 3 ng/L (ref <18)
Troponin I (High Sensitivity): 3 ng/L (ref <18)

## 2022-06-29 MED ORDER — TRAZODONE HCL 50 MG PO TABS
100.0000 mg | ORAL_TABLET | Freq: Every day | ORAL | Status: DC
  Administered 2022-06-29: 100 mg via ORAL
  Filled 2022-06-29: qty 2

## 2022-06-29 MED ORDER — THIAMINE MONONITRATE 100 MG PO TABS
100.0000 mg | ORAL_TABLET | Freq: Every day | ORAL | Status: DC
  Filled 2022-06-29 (×2): qty 1

## 2022-06-29 MED ORDER — THIAMINE HCL 100 MG/ML IJ SOLN: 100.0000 mg | Freq: Every day | INTRAMUSCULAR | Status: DC

## 2022-06-29 MED ORDER — LORAZEPAM 1 MG PO TABS
0.0000 mg | ORAL_TABLET | Freq: Four times a day (QID) | ORAL | Status: DC
  Filled 2022-06-29: qty 1

## 2022-06-29 MED ORDER — THIAMINE HCL 100 MG PO TABS
100.0000 mg | ORAL_TABLET | Freq: Every day | ORAL | Status: DC
  Administered 2022-06-29: 100 mg via ORAL
  Filled 2022-06-29: qty 1

## 2022-06-29 MED ORDER — LORAZEPAM 1 MG PO TABS: 0.0000 mg | ORAL_TABLET | Freq: Two times a day (BID) | ORAL | Status: DC

## 2022-06-29 MED ORDER — FOLIC ACID 1 MG PO TABS
1.0000 mg | ORAL_TABLET | Freq: Every day | ORAL | Status: DC
  Administered 2022-06-29 – 2022-06-30 (×2): 1 mg via ORAL

## 2022-06-29 MED ORDER — LORAZEPAM 1 MG PO TABS
1.0000 mg | ORAL_TABLET | ORAL | Status: DC | PRN
  Administered 2022-06-29: 1 mg via ORAL
  Filled 2022-06-29: qty 1

## 2022-06-29 MED ORDER — MIRTAZAPINE 15 MG PO TABS: 7.5000 mg | ORAL_TABLET | Freq: Every day | ORAL | Status: DC

## 2022-06-29 MED ORDER — HYDROXYZINE HCL 25 MG PO TABS
50.0000 mg | ORAL_TABLET | Freq: Every day | ORAL | Status: DC
  Administered 2022-06-29: 50 mg via ORAL
  Filled 2022-06-29: qty 2

## 2022-06-29 MED ORDER — GABAPENTIN 300 MG PO CAPS
300.0000 mg | ORAL_CAPSULE | Freq: Every day | ORAL | Status: DC
  Administered 2022-06-29: 300 mg via ORAL
  Filled 2022-06-29: qty 1

## 2022-06-29 MED ORDER — ESCITALOPRAM OXALATE 10 MG PO TABS
10.0000 mg | ORAL_TABLET | Freq: Every day | ORAL | Status: DC
  Administered 2022-06-29: 10 mg via ORAL
  Filled 2022-06-29: qty 1

## 2022-06-29 MED ORDER — FOLIC ACID 1 MG PO TABS
1.0000 mg | ORAL_TABLET | Freq: Every day | ORAL | Status: DC
  Filled 2022-06-29 (×2): qty 1

## 2022-06-29 MED ORDER — ADULT MULTIVITAMIN W/MINERALS CH
1.0000 | ORAL_TABLET | Freq: Every day | ORAL | Status: DC
  Administered 2022-06-29 – 2022-06-30 (×2): 1 via ORAL
  Filled 2022-06-29 (×2): qty 1

## 2022-06-29 MED ORDER — ESCITALOPRAM OXALATE 10 MG PO TABS
5.0000 mg | ORAL_TABLET | Freq: Every day | ORAL | Status: DC
  Administered 2022-06-30: 5 mg via ORAL
  Filled 2022-06-29: qty 1

## 2022-06-29 MED ORDER — LORAZEPAM 1 MG PO TABS: 1.0000 mg | ORAL_TABLET | ORAL | Status: DC | PRN

## 2022-06-29 MED ORDER — BUSPIRONE HCL 15 MG PO TABS: 7.5000 mg | ORAL_TABLET | Freq: Two times a day (BID) | ORAL | Status: DC

## 2022-06-29 NOTE — ED Provider Triage Note (Signed)
Emergency Medicine Provider Triage Evaluation Note  Michaeljohn R Casale , a 57 y.o. male  was evaluated in triage.  Pt complains of concerns for SI.  Notes that his plan is to take a bunch of sleeping pills.  Patient notes that he had 1/5 of liquor today.  Denies HI, visual/auditory hallucinations..  Review of Systems  Positive:  Negative:   Physical Exam  BP (!) 142/84 (BP Location: Right Arm)   Pulse 64   Temp 98.6 F (37 C) (Oral)   Resp 16   Ht '5\' 7"'$  (1.702 m)   Wt 59.9 kg   SpO2 97%   BMI 20.67 kg/m  Gen:   Awake, no distress   Resp:  Normal effort  MSK:   Moves extremities without difficulty  Other:    Medical Decision Making  Medically screening exam initiated at 2:17 PM.  Appropriate orders placed.  Trevonne R Grupp was informed that the remainder of the evaluation will be completed by another provider, this initial triage assessment does not replace that evaluation, and the importance of remaining in the ED until their evaluation is complete.  Patient voluntary at this time.  Workup initiated   Celest Reitz A, PA-C 06/29/22 1417

## 2022-06-29 NOTE — ED Provider Notes (Signed)
Received patient in turnover from Dr. Wyvonnia Dusky.  Please see their note for further details of Hx, PE.  Briefly patient is a 57 y.o. male with a Suicidal .  Has a plan to take multiple sleeping pills.  Awaiting blood work for medical clearance and then will have TTS evaluate..  Two trops negative.  Mild LFT elevation.  Could be due to alcoholism.  Not significantly changed from baseline.  Feel medically clear.  TTS eval.     Deno Etienne, DO 06/29/22 1926

## 2022-06-29 NOTE — ED Notes (Signed)
Pt thing locked up in locker number 1

## 2022-06-29 NOTE — ED Provider Notes (Signed)
[facility] Provider Note   CSN: [id number] Arrival date & time: 06/29/22  1310     History {Add pertinent medical, surgical, social history, OB history to HPI:1} Chief Complaint  Patient presents with   Suicidal    Austin [name] is a 57 y.o. male.  Patient brought in by police with suicidal thoughts as well as requesting detox from alcohol.  States he has a plan to overdose on sleeping pills but has not acted on his plan.  Has been feeling depressed and anxious for the past several days despite taking his home medications.  Admits to drinking 1/5 of liquor daily including today.  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  Denies any other drug use other than marijuana.  Denies homicidal thoughts or hearing voices.  Denies any shortness of breath but has had left-sided chest pain has been constant for the past 3 days and does not radiate anywhere.  No abdominal pain.  No vomiting. States he has a plan to overdose on sleeping pills.  The history is provided by the patient.       Home Medications Prior to Admission medications   Medication Sig Start Date End Date Taking? Authorizing Provider  acetaminophen (TYLENOL) 500 MG tablet Take 1,000 mg by mouth every 6 (six) hours as needed for headache (pain).    [provider]  busPIRone (BUSPAR) 7.5 MG tablet Take 7.5 mg by mouth 2 (two) times daily. 07/31/21   [provider]  diclofenac Sodium (VOLTAREN) 1 % GEL Apply 4 g topically 4 (four) times daily as needed (pain). 08/10/21   [provider]  dicyclomine (BENTYL) 20 MG tablet Take 1 tablet (20 mg total) by mouth 2 (two) times daily as needed for up to 20 doses for spasms. 05/08/22   Wyvonnia Dusky, MD  disulfiram (ANTABUSE) 250 MG tablet Take 250 mg by mouth daily.    [provider]  escitalopram (LEXAPRO) 10 MG tablet Take 10 mg by mouth daily.    [provider]  fluticasone (FLONASE)  50 MCG/ACT nasal spray Place 2 sprays into both nostrils daily as needed for allergies.    [provider]  folic acid (FOLVITE) 1 MG tablet Take 1 tablet (1 mg total) by mouth daily. 09/11/21   Oswald Hillock, MD  gabapentin (NEURONTIN) 300 MG capsule Take 300 mg by mouth daily.    [provider]  hydrOXYzine (ATARAX) 25 MG tablet Take 50 mg by mouth at bedtime. 09/05/21   [provider]  loperamide (IMODIUM A-D) 2 MG tablet Take 2 mg by mouth See admin instructions. Take one tablet (2 mg) by mouth every morning, may take one tablet (2 mg) in the afternoon as needed for diarrhea 07/04/20   [provider]  magnesium oxide (MAG-OX) 400 (240 Mg) MG tablet Take 400 mg by mouth 2 (two) times daily.    [provider]  Multiple Vitamins-Minerals (MULTIVITAMIN ADULTS) TABS Take 1 tablet by mouth daily.    [provider]  omeprazole (PRILOSEC) 20 MG capsule Take 20 mg by mouth daily.    [provider]  ondansetron (ZOFRAN) 4 MG tablet Take 1 tablet (4 mg total) by mouth every 6 (six) hours. Patient taking differently: Take 4 mg by mouth daily as needed for nausea or vomiting. 02/17/21   Meccariello, Bernita Raisin, MD  ondansetron (ZOFRAN-ODT) 4 MG disintegrating tablet Take 1 tablet (4 mg total) by mouth every  8 (eight) hours as needed for up to 12 doses for nausea or vomiting. 05/08/22   Wyvonnia Dusky, MD  potassium chloride SA (KLOR-CON M) 20 MEQ tablet Take 1 tablet (20 mEq total) by mouth 2 (two) times daily. Patient not taking: Reported on 03/04/2022 02/07/32   Prince Rome, PA-C  thiamine 100 MG tablet Take 1 tablet (100 mg total) by mouth daily. 09/11/21   Oswald Hillock, MD  traZODone (DESYREL) 100 MG tablet Take 100 mg by mouth at bedtime.    [provider]      Allergies    Chlorhexidine    Review of Systems   Review of Systems  Constitutional:  Negative for activity change, appetite change and fever.  HENT:  Negative for  congestion.   Respiratory:  Positive for chest tightness.   Cardiovascular:  Positive for chest pain.  Gastrointestinal:  Negative for abdominal pain, nausea and vomiting.  Genitourinary:  Negative for dysuria and hematuria.  Musculoskeletal:  Negative for arthralgias and myalgias.  Neurological:  Negative for dizziness, weakness and headaches.  Psychiatric/Behavioral:  Positive for decreased concentration, dysphoric mood, self-injury, sleep disturbance and suicidal ideas. The patient is nervous/anxious and is hyperactive.    all other systems are negative except as noted in the HPI and PMH.    Physical Exam Updated Vital Signs BP (!) 142/84 (BP Location: Right Arm)   Pulse 64   Temp 98.6 F (37 C) (Oral)   Resp 16   Ht '5\' 7"'$  (1.702 m)   Wt 59.9 kg   SpO2 97%   BMI 20.67 kg/m  Physical Exam Vitals and nursing note reviewed.  Constitutional:      General: He is not in acute distress.    Appearance: He is well-developed.     Comments: Flat affect, slow to respond  HENT:     Head: Normocephalic and atraumatic.     Mouth/Throat:     Pharynx: No oropharyngeal exudate.  Eyes:     Conjunctiva/sclera: Conjunctivae normal.     Pupils: Pupils are equal, round, and reactive to light.     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 Cardiovascular:     Rate and Rhythm: Normal rate and regular rhythm.     Heart sounds: Normal heart sounds. No murmur heard. Pulmonary:     Effort: Pulmonary effort is normal. No respiratory distress.     Breath sounds: Normal breath sounds.     Comments: Reproducible left chest wall tenderness Chest:     Chest wall: Tenderness present.  Abdominal:     Palpations: Abdomen is soft.     Tenderness: There is no abdominal tenderness. There is no guarding or rebound.  Musculoskeletal:        General: No tenderness. Normal range of motion.     Cervical back: Normal range of motion and  neck supple.  Skin:    General: Skin is warm.  Neurological:     Mental Status: He is alert and oriented to person, place, and time.     Cranial Nerves: No cranial nerve deficit.     Motor: No abnormal muscle tone.     Coordination: Coordination normal.     Comments: No ataxia on finger to nose bilaterally. No pronator drift. 5/5 strength throughout. CN 2-12 intact.Equal grip strength. Sensation intact.   Psychiatric:        Behavior: Behavior normal.  ED Results / Procedures / Treatments   Labs (all labs ordered are listed, but only abnormal results are displayed) Labs Reviewed  COMPREHENSIVE METABOLIC PANEL  ETHANOL  RAPID URINE DRUG SCREEN, HOSP PERFORMED  CBC WITH DIFFERENTIAL/PLATELET  ACETAMINOPHEN LEVEL  SALICYLATE LEVEL  TROPONIN I (HIGH SENSITIVITY)    EKG None  Radiology No results found.  Procedures Procedures  {Document cardiac monitor, telemetry assessment procedure when appropriate:1}  Medications Ordered in ED Medications - No data to display  ED Course/ Medical Decision Making/ A&P   {   Click here for ABCD2, HEART and other calculatorsREFRESH Note before signing :1}                          Medical Decision Making Amount and/or Complexity of Data Reviewed Labs: ordered. Decision-making details documented in ED Course. Radiology: ordered and independent interpretation performed. Decision-making details documented in ED Course. ECG/medicine tests: ordered and independent interpretation performed. Decision-making details documented in ED Course.  Patient with suicidal ideation with plan to overdose.  Admits to alcohol abuse.  Also having left-sided chest wall pain for the past 3 days that is constant.  Will check screening EKG, chest x-ray, labs and single troponin.  {Document critical care time when appropriate:1} {Document review of labs and clinical decision tools ie heart score, Chads2Vasc2 etc:1}  {Document your independent review of  radiology images, and any outside records:1} {Document your discussion with family members, caretakers, and with consultants:1} {Document social determinants of health affecting pt's care:1} {Document your decision making why or why not admission, treatments were needed:1} Final Clinical Impression(s) / ED Diagnoses Final diagnoses:  None    Rx / DC Orders ED Discharge Orders     None

## 2022-06-29 NOTE — ED Triage Notes (Signed)
Brought in by PD for SI and ETOH abuse.  Patient has hx of psych.  Patient had a plan of taking a bunch of sleeping pills with alcohol.  Patient reports he drinks atleast a 5th of liquor a day.

## 2022-06-30 DIAGNOSIS — F1094 Alcohol use, unspecified with alcohol-induced mood disorder: Secondary | ICD-10-CM | POA: Diagnosis not present

## 2022-06-30 NOTE — ED Notes (Signed)
This RN spoke with patient's sister on the phone who will be here to come pick up patient and take him home. All discharge paperwork including counseling and substance abuse resources provided and explained to patient. Patient verbalized understanding of same and had no other questions. All belongings removed from locker #1 and returned to patient prior to leaving department.

## 2022-06-30 NOTE — Progress Notes (Signed)
CSW provided patient with outpatient mental health resources. Patient stated he is blind but he has family and friends that check in on him. Patient stated they can assist him with the resources provided if needed. Patient plans on calling a friend or family member once he gets his phone to take him home.

## 2022-06-30 NOTE — ED Notes (Signed)
Pt is NOT ivc voluntary consent form attached to the clipboard in blue

## 2022-06-30 NOTE — ED Provider Notes (Addendum)
Emergency Medicine Observation Re-evaluation Note  Austin [name] is a 57 y.o. male, seen on rounds today.  Pt initially presented to the ED for complaints of Suicidal Currently, the patient is sleeping.  Physical Exam  BP (!) 132/91   Pulse 73   Temp 97.8 F (36.6 C) (Axillary)   Resp 16   Ht '5\' 7"'$  (1.702 m)   Wt 59.9 kg   SpO2 98%   BMI 20.67 kg/m  Physical Exam General: no acute distress Cardiac: regular rate Lungs: equal chest rise Psych: calm  ED Course / MDM  EKG:EKG Interpretation  Date/Time:  Wednesday June 30 2022 05:41:39 EST Ventricular Rate:  65 PR Interval:  143 QRS Duration: 87 QT Interval:  450 QTC Calculation: 468 R Axis:   48 Text Interpretation: Sinus rhythm Probable anteroseptal infarct, old No significant change was found Confirmed by Ezequiel Essex [phone]) on 06/30/2022 5:46:20 AM  I have reviewed the labs performed to date as well as medications administered while in observation.  Recent changes in the last 24 hours include arrived in ER, pending TTS eval.  Plan  Current plan is for pending TTS evaluation.    Cristie Hem, MD 06/30/22 [phone]  Patient cleared psychiatrically by TTS team. Will discharge patient to home. All questions answered. Patient comfortable with plan of discharge. Return precautions discussed with patient and specified on the after visit summary.    Cristie Hem, MD 06/30/22 1230

## 2022-06-30 NOTE — BH Assessment (Signed)
Clinician attempted to complete assessment with patient, however patient would not stay awake. Patient had to be aroused several times and would continue to fall asleep. RN Amado Nash updated.   Darra Lis, MSW, LCSW Triage Specialist

## 2022-06-30 NOTE — Consult Note (Signed)
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 male patient admitted with suicidal ideation and BAL of 251.  HPI:   Patient seen via telepsych by this provider; chart reviewed and consulted with Dr. Dwyane Dee on 06/30/22.  On evaluation Austin [name] reports  he's having withdrawal symptoms, "body aches, nausea but I no longer want to hurt myself. That was because I was drinking yesterday." Pt reports being triggered by personal loss.   Reports his girlfriend passed away earlier this month d/t complication of alcoholism.  Reports yesterday he was thinking about her and started drinking "a lot of alcohol" to self medicate.  Today he reports he's sad and misses his girlfriend but has sobered up and no longer has suicidal thoughts or plans for self harm.   He reports he's been drinking for the pat 38 years, with on and off sobriety.  At present he reports feeling, "jittery, loss appetite" d/t alcohol withdrawal sx, he is receiving ativan per [facility] protocol which her reports has helped/decreased sx.  Per chart review, there's a documented hx of alcohol withdrawal seizure but patient denies this; he also denies hx of blackouts and states he is unsure who provided that history.  Patient has many medical comorbidities but at present has already been medically cleared and denies concerns that can addressed at present.    During evaluation Austin [name] is in exam room and laying upright in bed; He is  alert/oriented x 4; calm/cooperative; and mood congruent with affect.  Patient is speaking in a clear tone at moderate volume, and normal pace; with good eye contact.  His thought process is coherent and relevant; There is no indication that he is currently responding to internal/external stimuli or experiencing delusional thought content.  Patient now denies suicidal ideations, appears to have cleared up since he's no longer acutely intoxicated.  He denies homicidal ideation, psychosis, and paranoia.  Patient has remained calm throughout assessment and has answered questions appropriately.   Per collateral received from [place] for patient after she spoke with pt's,"trudi" via telephone. "She states they are aware of his depressions his wife recently died and they have a plan to have him come stay with her while he gets help. Just thought I would let you know. I added her name and number to his list of contacts." ----------------------------------------------------------------------------------  Per ED Provider Admission Assessment 06/29/2022: Chief Complaint  Patient presents with   Suicidal      Austin [name] is a 57 y.o. male.   Patient brought in by police with suicidal thoughts as well as requesting detox from alcohol.  States he has a plan to overdose on sleeping pills but has not acted on his plan.  Has been feeling depressed and anxious for the past several days despite taking his home medications.  Admits to drinking 1/5 of liquor daily including today.  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  Denies any other drug use other than marijuana.  Denies homicidal thoughts or hearing voices.  Denies any shortness of breath but has had left-sided chest pain has been constant for the past 3 days and does not radiate anywhere.  No abdominal pain.  No vomiting. States he has a plan to overdose on sleeping pills.   The history is provided by the patient.    Past  Psychiatric History: as outlined below  Risk to Self:  denies Risk to Others:  denies Prior Inpatient Therapy: yes  Prior Outpatient Therapy:  denies  Past Medical History:  Past Medical History:  Diagnosis Date   Alcohol abuse    Anxiety    Arthritis    Blind in both eyes    sees lights and shadows   Elevated LFTs    Foot and toe(s), blister    bilat feet, blistering between toes   GERD (gastroesophageal reflux disease)    Glaucoma    Glaucoma as birth trauma    Headache(784.0)    Hypertension    hx of   Insomnia    Pancreatitis    UTI (lower urinary tract infection) 08/04/11   finishing abx tomorrow   Weight loss    25 lb wt loss since 08/2010   Wrist fracture, right     Past Surgical History:  Procedure Laterality Date   COLONOSCOPY  2006?   ESOPHAGOGASTRODUODENOSCOPY     ESOPHAGOGASTRODUODENOSCOPY (EGD) WITH PROPOFOL N/A 05/09/2017   Procedure: ESOPHAGOGASTRODUODENOSCOPY (EGD) WITH PROPOFOL;  Surgeon: Doran Stabler, MD;  Location: [facility];  Service: Gastroenterology;  Laterality: N/A;   EYE SURGERY Left    EYE SURGERY Right    prostetic eye   foot srugery     removal of bone near small toe   FRACTURE SURGERY Right    wrist   HARDWARE REMOVAL  08/06/2011   Procedure: HARDWARE REMOVAL;  Surgeon: Newt Minion, MD;  Location: [facility];  Service: Orthopedics;  Laterality: Right;  Removal Deep Retained Hardware Right Distal Radius   MULTIPLE TOOTH EXTRACTIONS     for dentures   TOTAL HIP ARTHROPLASTY Left 06/06/2019   Procedure: LEFT TOTAL HIP ARTHROPLASTY-DIRECT ANTERIOR;  Surgeon: [name], MD;  Location: [facility];  Service: Orthopedics;  Laterality: Left;   TOTAL HIP ARTHROPLASTY Right 01/07/2020   Procedure: RIGHT TOTAL HIP ARTHROPLASTY DIRECT ANTERIOR;  Surgeon: [name], MD;  Location: [facility];  Service: Orthopedics;  Laterality: Right;   UPPER GASTROINTESTINAL ENDOSCOPY     Family History:  Family History  Problem Relation Age of Onset   Diabetes  Sister    Diabetes Maternal Aunt        aunt and uncles   Hypertension Sister        aunt and uncles maternal side   Colon cancer Neg Hx    Colon polyps Neg Hx    Esophageal cancer Neg Hx    Rectal cancer Neg Hx    Stomach cancer Neg Hx    Family Psychiatric  History: denies Social History:  Social History   Substance and Sexual Activity  Alcohol Use Yes   Comment: 5th of liquor a day     Social History   Substance and Sexual Activity  Drug Use Not Currently   Frequency: 7.0 times per week   Types: Marijuana   Comment: smokes marijuana 1 to 2 times daily    Social History   Socioeconomic History   Marital status: Significant Other    Spouse name: Not on file   Number  of children: 1   Years of education: Not on file   Highest education level: Not on file  Occupational History   Occupation: disabled  Tobacco Use   Smoking status: Every Day    Types: Cigars   Smokeless tobacco: Never   Tobacco comments:    2-3 cigars a day   Vaping Use   Vaping Use: Never used  Substance and Sexual Activity   Alcohol use: Yes    Comment: 5th of liquor a day   Drug use: Not Currently    Frequency: 7.0 times per week    Types: Marijuana    Comment: smokes marijuana 1 to 2 times daily   Sexual activity: Not Currently  Other Topics Concern   Not on file  Social History Narrative   Not on file   Social Determinants of Health   Financial Resource Strain: Not on file  Food Insecurity: Not on file  Transportation Needs: Not on file  Physical Activity: Not on file  Stress: Not on file  Social Connections: Not on file   Additional Social History:    Allergies:   Allergies  Allergen Reactions   Chlorhexidine Rash    Foley D/Ced this AM 01/08/2020    Labs:  Results for orders placed or performed during the hospital encounter of 06/29/22 (from the past 48 hour(s))  Ethanol     Status: Abnormal   Collection Time: 06/29/22  3:29 PM  Result Value Ref Range   Alcohol, Ethyl  (B) 251 (H) <10 mg/dL    Comment: (NOTE) Lowest detectable limit for serum alcohol is 10 mg/dL.  For medical purposes only. Performed at [facility], [place] [address], [place], [place] [address]   Acetaminophen level     Status: Abnormal   Collection Time: 06/29/22  3:29 PM  Result Value Ref Range   Acetaminophen (Tylenol), Serum <10 (L) 10 - 30 ug/mL    Comment: (NOTE) Therapeutic concentrations vary significantly. A range of 10-30 ug/mL  may be an effective concentration for many patients. However, some  are best treated at concentrations outside of this range. Acetaminophen concentrations >150 ug/mL at 4 hours after ingestion  and >50 ug/mL at 12 hours after ingestion are often associated with  toxic reactions.  Performed at [facility], [place] [address] [place], [place] [address]   Salicylate level     Status: Abnormal   Collection Time: 06/29/22  3:29 PM  Result Value Ref Range   Salicylate Lvl <5.1 (L) 7.0 - 30.0 mg/dL    Comment: Performed at [facility] [address], [place], [place] [address]  Comprehensive metabolic panel     Status: Abnormal   Collection Time: 06/29/22  3:30 PM  Result Value Ref Range   Sodium 138 135 - 145 mmol/L   Potassium 3.9 3.5 - 5.1 mmol/L   Chloride 99 98 - 111 mmol/L   CO2 28 22 - 32 mmol/L   Glucose, Bld 81 70 - 99 mg/dL    Comment: Glucose reference range applies only to samples taken after fasting for at least 8 hours.   BUN 9 6 - 20 mg/dL   Creatinine, Ser 0.99 0.61 - 1.24 mg/dL   Calcium 8.5 (L) 8.9 - 10.3 mg/dL   Total Protein 6.7 6.5 - 8.1 g/dL   Albumin 3.8 3.5 - 5.0 g/dL   AST 207 (H) 15 - 41 U/L   ALT 191 (H) 0 - 44 U/L   Alkaline Phosphatase  138 (H) 38 - 126 U/L   Total Bilirubin 0.4 0.3 - 1.2 mg/dL   GFR, Estimated >60 >60 mL/min    Comment: (NOTE) Calculated using the CKD-EPI Creatinine Equation (2021)    Anion gap 11 5 - 15    Comment: Performed at [facility] [address],  [place], [place] [address]  CBC with Diff     Status: Abnormal   Collection Time: 06/29/22  3:30 PM  Result Value Ref Range   WBC 3.2 (L) 4.0 - 10.5 K/uL   RBC 4.33 4.22 - 5.81 MIL/uL   Hemoglobin 14.2 13.0 - 17.0 g/dL   HCT 39.4 39.0 - 52.0 %   MCV 91.0 80.0 - 100.0 fL   MCH 32.8 26.0 - 34.0 pg   MCHC 36.0 30.0 - 36.0 g/dL   RDW 13.2 11.5 - 15.5 %   Platelets 125 (L) 150 - 400 K/uL   nRBC 0.0 0.0 - 0.2 %   Neutrophils Relative % 44 %   Neutro Abs 1.4 (L) 1.7 - 7.7 K/uL   Lymphocytes Relative 51 %   Lymphs Abs 1.6 0.7 - 4.0 K/uL   Monocytes Relative 4 %   Monocytes Absolute 0.1 0.1 - 1.0 K/uL   Eosinophils Relative 1 %   Eosinophils Absolute 0.0 0.0 - 0.5 K/uL   Basophils Relative 0 %   Basophils Absolute 0.0 0.0 - 0.1 K/uL   WBC Morphology See Note     Comment: ALLN   nRBC 0 0 /100 WBC   Abs Immature Granulocytes 0.00 0.00 - 0.07 K/uL    Comment: Performed at [facility], [place] [address] [place], [place] [address]  Troponin I (High Sensitivity)     Status: None   Collection Time: 06/29/22  3:30 PM  Result Value Ref Range   Troponin I (High Sensitivity) 3 <18 ng/L    Comment: (NOTE) Elevated high sensitivity troponin I (hsTnI) values and significant  changes across serial measurements may suggest ACS but many other  chronic and acute conditions are known to elevate hsTnI results.  Refer to the "Links" section for chest pain algorithms and additional  guidance. Performed at [facility], [place] [address], [place], [place] [address]   Troponin I (High Sensitivity)     Status: None   Collection Time: 06/29/22  6:25 PM  Result Value Ref Range   Troponin I (High Sensitivity) 3 <18 ng/L    Comment: (NOTE) Elevated high sensitivity troponin I (hsTnI) values and significant  changes across serial measurements may suggest ACS but many other  chronic and acute conditions are known to elevate hsTnI results.  Refer to the "Links" section for chest pain algorithms and  additional  guidance. Performed at [facility], [place] [address] [place], [place] [address]     Medications:  Current Facility-Administered Medications  Medication Dose Route Frequency Provider Last Rate Last Admin   escitalopram (LEXAPRO) tablet 5 mg  5 mg Oral Daily Deno Etienne, DO   5 mg at 74/25/95 6387   folic acid (FOLVITE) tablet 1 mg  1 mg Oral Daily Rancour, Stephen, MD       folic acid (FOLVITE) tablet 1 mg  1 mg Oral Daily Deno Etienne, DO   1 mg at 06/30/22 1029   hydrOXYzine (ATARAX) tablet 50 mg  50 mg Oral QHS Deno Etienne, DO   50 mg at 06/29/22 2217   LORazepam (ATIVAN) tablet 0-4 mg  0-4 mg Oral  Q6H Rancour, Annie Main, MD       Followed by   Derrill Memo ON 07/01/2022] LORazepam (ATIVAN) tablet 0-4 mg  0-4 mg Oral Q12H Rancour, Stephen, MD       LORazepam (ATIVAN) tablet 1-4 mg  1-4 mg Oral Q1H PRN Rancour, Stephen, MD       Or   LORazepam (ATIVAN) tablet 1 mg  1 mg Oral Q1H PRN Rancour, Stephen, MD   1 mg at 06/29/22 2304   mirtazapine (REMERON) tablet 7.5 mg  7.5 mg Oral QHS Deno Etienne, DO       multivitamin with minerals tablet 1 tablet  1 tablet Oral Daily Rancour, Stephen, MD   1 tablet at 06/30/22 1029   thiamine (VITAMIN B1) tablet 100 mg  100 mg Oral Daily Rancour, Stephen, MD       Or   thiamine (VITAMIN B1) injection 100 mg  100 mg Intravenous Daily Rancour, Stephen, MD       thiamine (VITAMIN B1) tablet 100 mg  100 mg Oral Daily Deno Etienne, DO   100 mg at 06/29/22 1707   traZODone (DESYREL) tablet 100 mg  100 mg Oral QHS Deno Etienne, DO   100 mg at 06/29/22 2217   Current Outpatient Medications  Medication Sig Dispense Refill   acetaminophen (TYLENOL) 500 MG tablet Take 1,000 mg by mouth every 6 (six) hours as needed for headache (pain).     busPIRone (BUSPAR) 7.5 MG tablet Take 7.5 mg by mouth 2 (two) times daily.     diclofenac Sodium (VOLTAREN) 1 % GEL Apply 4 g topically 4 (four) times daily as needed (pain).     dicyclomine (BENTYL) 20 MG tablet Take 1 tablet  (20 mg total) by mouth 2 (two) times daily as needed for up to 20 doses for spasms. 20 tablet 0   disulfiram (ANTABUSE) 250 MG tablet Take 250 mg by mouth daily.     escitalopram (LEXAPRO) 10 MG tablet Take 10 mg by mouth daily.     fluticasone (FLONASE) 50 MCG/ACT nasal spray Place 2 sprays into both nostrils daily as needed for allergies.     folic acid (FOLVITE) 1 MG tablet Take 1 tablet (1 mg total) by mouth daily. 30 tablet 1   gabapentin (NEURONTIN) 300 MG capsule Take 300 mg by mouth daily.     hydrOXYzine (ATARAX) 25 MG tablet Take 50 mg by mouth at bedtime.     loperamide (IMODIUM A-D) 2 MG tablet Take 2 mg by mouth See admin instructions. Take one tablet (2 mg) by mouth every morning, may take one tablet (2 mg) in the afternoon as needed for diarrhea     magnesium oxide (MAG-OX) 400 (240 Mg) MG tablet Take 400 mg by mouth 2 (two) times daily.     Multiple Vitamins-Minerals (MULTIVITAMIN ADULTS) TABS Take 1 tablet by mouth daily.     omeprazole (PRILOSEC) 20 MG capsule Take 20 mg by mouth daily.     ondansetron (ZOFRAN) 4 MG tablet Take 1 tablet (4 mg total) by mouth every 6 (six) hours. (Patient taking differently: Take 4 mg by mouth daily as needed for nausea or vomiting.) 12 tablet 0   ondansetron (ZOFRAN-ODT) 4 MG disintegrating tablet Take 1 tablet (4 mg total) by mouth every 8 (eight) hours as needed for up to 12 doses for nausea or vomiting. 12 tablet 0   potassium chloride SA (KLOR-CON M) 20 MEQ tablet Take 1 tablet (20 mEq total) by mouth 2 (two) times daily. (  Patient not taking: Reported on 03/04/2022) 6 tablet 0   thiamine 100 MG tablet Take 1 tablet (100 mg total) by mouth daily. 30 tablet 3   traZODone (DESYREL) 100 MG tablet Take 100 mg by mouth at bedtime.      Musculoskeletal: pt ambulates independently and moves all extremities Strength & Muscle Tone: within normal limits Gait & Station: normal Patient leans: N/A   Psychiatric Specialty Exam:  Presentation  General  Appearance:  Appropriate for Environment; Fairly Groomed (in hospital scrubs)  Eye Contact: Other (comment) (patient is legally blind)  Speech: Clear and Coherent  Speech Volume: Normal  Handedness: Right   Mood and Affect  Mood: Depressed; Anxious  Affect: Congruent   Thought Process  Thought Processes: Coherent; Goal Directed  Descriptions of Associations:Intact  Orientation:Full (Time, Place and Person)  Thought Content:Logical (has improved with rest and weaning alcohol intoxication)  History of Schizophrenia/Schizoaffective disorder:No  Duration of Psychotic Symptoms:No data recorded Hallucinations:Hallucinations: None  Ideas of Reference:None  Suicidal Thoughts:Suicidal Thoughts: No  Homicidal Thoughts:Homicidal Thoughts: No   Sensorium  Memory: Immediate Good; Recent Good  Judgment: -- (impulsive with alcoholic use but no acute safety concerns)  Insight: Fair   Community education officer  Concentration: Fair  Attention Span: Fair  Recall: Good  Fund of Knowledge: Good  Language: Good   Psychomotor Activity  Psychomotor Activity:Psychomotor Activity: Normal   Assets  Assets: Communication Skills; Desire for Improvement; Housing; Catering manager; Social Support   Sleep  Sleep:Sleep: Fair Number of Hours of Sleep: 7    Physical Exam: Physical Exam Constitutional:      Appearance: Normal appearance.  Cardiovascular:     Rate and Rhythm: Normal rate.     Pulses: Normal pulses.  Pulmonary:     Effort: Pulmonary effort is normal.  Musculoskeletal:        General: Normal range of motion.     Cervical back: Normal range of motion.  Neurological:     Mental Status: He is alert and oriented to person, place, and time. Mental status is at baseline.  Psychiatric:        Attention and Perception: Attention and perception normal.        Mood and Affect: Mood is depressed.        Speech: Speech normal.         Behavior: Behavior normal. Behavior is cooperative.        Thought Content: Thought content is not paranoid or delusional. Thought content does not include homicidal ideation. Thought content does not include homicidal or suicidal plan.        Cognition and Memory: Cognition normal.        Judgment: Judgment is impulsive (Pt is impulsive when intoxicated).    Review of Systems  Constitutional: Negative.   HENT: Negative.    Eyes:        Per chart review, pt is blind in both eyes.  Respiratory: Negative.    Cardiovascular: Negative.   Gastrointestinal: Negative.   Genitourinary: Negative.   Skin: Negative.   Neurological:  Positive for tremors and headaches. Negative for speech change, focal weakness, seizures, loss of consciousness and weakness.  Endo/Heme/Allergies: Negative.   Psychiatric/Behavioral:  Positive for depression and substance abuse. The patient is nervous/anxious.    Blood pressure (!) 166/93, pulse 78, temperature 98.2 F (36.8 C), temperature source Oral, resp. rate 12, height '5\' 7"'$  (1.702 m), weight 59.9 kg, SpO2 100 %. Body mass index is 20.67 kg/m.  Treatment Plan Summary: Patient  presented to [facility] with suicidal ideations and a plan to overdose on pills triggered  by recent personal loss and substance abuse.  Pt has a history for depression and his presentation is complicated by alcoholism and psychotropic medication non-adherence.  Patient has been seen about 6 times within the past 9 months and offered both detox and mental health care; receives treatment while inpatient usually sobers up and no longer endorses suicidal ideation.  Unfortunately when discharged and referred for continued outpatient mental health resources he does not follow-up, mentally declines with repeated alcohol usage.  Worth noting that this pt has not attempted to commit suicide, and has proven ability to present to ED while in crisis.  On assessment today, he endorses being sad d/t  personal loss of his girlfriend earlier this month.  However, he denies suicidal/homicidal ideation, is clear and coherent and does not endorse audible or visual hallucination.  He was restarted on escitalopram, buspar and trazodone since admission and has tolerated well.  He's offered referral to [facility] for detox and mental care care but declines. Pt has spoken with his sister Aram Beecham since admission and has verbalized a plan to go to her house upon discharge for familial support. Above information is collaborated by [employer] nurse Eliseo Squires who's spoken with pt sister as well.    As per above, patient does not meet criteria for involuntary psychiatric admissions and states he is not interested in voluntary admissions.  He is psych cleared and recommended he follow up with outpatient resources for substance abuse and mental health treatment added to discharge AVS via SW.    We reviewed the importance of substance abuse abstinence; potential negative impact substance abuse can have on relationships and level of functioning.  Also reviewed the importance of medication compliance.  Pt reports has psych medications at home (buspar, escitalopram, trazodone) and does not refills/renewals today.  Can further discuss with outpatient follow-up with [facility].   Disposition: No evidence of imminent risk to self or others at present.   Patient does not meet criteria for psychiatric inpatient admission. Supportive therapy provided about ongoing stressors. Discussed crisis plan, support from social network, calling 911, coming to the Emergency Department, and calling Suicide Hotline.   Dr. Garnette Gunner; Eliseo Squires,  RN, Raina Mina, LCSW were all informed of above recommendation and disposition via secure messaging.   This service was provided via telemedicine using a 2-way, interactive audio and video technology.  Names of all persons participating in this telemedicine service  and their role in this encounter. Name: Austin [name] Role: Patient   Name: Merlyn Lot Role: [place]  Name: Hampton Abbot Role: Psychiatrist    Mallie Darting, NP 06/30/2022 3:20 PM

## 2022-07-01 ENCOUNTER — Other Ambulatory Visit (HOSPITAL_COMMUNITY)
Admission: EM | Admit: 2022-07-01 | Discharge: 2022-07-05 | Disposition: A | Discharge status: Home / Self Care | Payer: Medicare Other | Attending: Psychiatry | Admitting: Psychiatry

## 2022-07-01 DIAGNOSIS — F1010 Alcohol abuse, uncomplicated: Secondary | ICD-10-CM | POA: Diagnosis present

## 2022-07-01 DIAGNOSIS — F1020 Alcohol dependence, uncomplicated: Secondary | ICD-10-CM

## 2022-07-01 DIAGNOSIS — H547 Unspecified visual loss: Secondary | ICD-10-CM | POA: Diagnosis not present

## 2022-07-01 DIAGNOSIS — Z634 Disappearance and death of family member: Secondary | ICD-10-CM | POA: Insufficient documentation

## 2022-07-01 DIAGNOSIS — Z1152 Encounter for screening for COVID-19: Secondary | ICD-10-CM | POA: Insufficient documentation

## 2022-07-01 DIAGNOSIS — F1290 Cannabis use, unspecified, uncomplicated: Secondary | ICD-10-CM | POA: Diagnosis not present

## 2022-07-01 DIAGNOSIS — Z79899 Other long term (current) drug therapy: Secondary | ICD-10-CM | POA: Insufficient documentation

## 2022-07-01 DIAGNOSIS — F10930 Alcohol use, unspecified with withdrawal, uncomplicated: Secondary | ICD-10-CM | POA: Diagnosis not present

## 2022-07-01 DIAGNOSIS — R45851 Suicidal ideations: Secondary | ICD-10-CM | POA: Insufficient documentation

## 2022-07-01 LAB — POC SARS CORONAVIRUS 2 AG: SARSCOV2ONAVIRUS 2 AG: NEGATIVE

## 2022-07-01 LAB — COMPREHENSIVE METABOLIC PANEL
ALT: 143 U/L — ABNORMAL HIGH (ref 0–44)
AST: 138 U/L — ABNORMAL HIGH (ref 15–41)
Albumin: 4 g/dL (ref 3.5–5.0)
Alkaline Phosphatase: 129 U/L — ABNORMAL HIGH (ref 38–126)
Anion gap: 8 (ref 5–15)
BUN: 5 mg/dL — ABNORMAL LOW (ref 6–20)
CO2: 30 mmol/L (ref 22–32)
Calcium: 9.2 mg/dL (ref 8.9–10.3)
Chloride: 99 mmol/L (ref 98–111)
Creatinine, Ser: 1.06 mg/dL (ref 0.61–1.24)
GFR, Estimated: >60 mL/min (ref >60)
Glucose, Bld: 113 mg/dL — ABNORMAL HIGH (ref 70–99)
Potassium: 3.8 mmol/L (ref 3.5–5.1)
Sodium: 137 mmol/L (ref 135–145)
Total Bilirubin: 1 mg/dL (ref 0.3–1.2)
Total Protein: 6.8 g/dL (ref 6.5–8.1)

## 2022-07-01 LAB — CBC WITH DIFFERENTIAL/PLATELET
Abs Immature Granulocytes: 0 10*3/uL (ref 0.00–0.07)
Basophils Absolute: 0 10*3/uL (ref 0.0–0.1)
Basophils Relative: 1 %
Eosinophils Absolute: 0 10*3/uL (ref 0.0–0.5)
Eosinophils Relative: 0 %
HCT: 39.6 % (ref 39.0–52.0)
Hemoglobin: 14.1 g/dL (ref 13.0–17.0)
Immature Granulocytes: 0 %
Lymphocytes Relative: 52 %
Lymphs Abs: 1.7 10*3/uL (ref 0.7–4.0)
MCH: 32.6 pg (ref 26.0–34.0)
MCHC: 35.6 g/dL (ref 30.0–36.0)
MCV: 91.7 fL (ref 80.0–100.0)
Monocytes Absolute: 0.3 10*3/uL (ref 0.1–1.0)
Monocytes Relative: 9 %
Neutro Abs: 1.2 10*3/uL — ABNORMAL LOW (ref 1.7–7.7)
Neutrophils Relative %: 38 %
Platelets: 103 10*3/uL — ABNORMAL LOW (ref 150–400)
RBC: 4.32 MIL/uL (ref 4.22–5.81)
RDW: 12.8 % (ref 11.5–15.5)
WBC: 3.2 10*3/uL — ABNORMAL LOW (ref 4.0–10.5)
nRBC: 0 % (ref 0.0–0.2)

## 2022-07-01 LAB — RESP PANEL BY RT-PCR (RSV, FLU A&B, COVID)  RVPGX2
Influenza A by PCR: NEGATIVE
Influenza B by PCR: NEGATIVE
Resp Syncytial Virus by PCR: NEGATIVE
SARS Coronavirus 2 by RT PCR: NEGATIVE

## 2022-07-01 LAB — LIPID PANEL
Cholesterol: 182 mg/dL (ref 0–200)
HDL: 75 mg/dL (ref >40)
LDL Cholesterol: 94 mg/dL (ref 0–99)
Total CHOL/HDL Ratio: 2.4 RATIO
Triglycerides: 67 mg/dL (ref <150)
VLDL: 13 mg/dL (ref 0–40)

## 2022-07-01 LAB — HEMOGLOBIN A1C
Hgb A1c MFr Bld: 5.8 % — ABNORMAL HIGH (ref 4.8–5.6)
Mean Plasma Glucose: 119.76 mg/dL

## 2022-07-01 LAB — POCT URINE DRUG SCREEN - MANUAL ENTRY (I-SCREEN)
POC Amphetamine UR: NOT DETECTED
POC Buprenorphine (BUP): NOT DETECTED
POC Cocaine UR: NOT DETECTED
POC Marijuana UR: NOT DETECTED
POC Methadone UR: NOT DETECTED
POC Methamphetamine UR: NOT DETECTED
POC Morphine: NOT DETECTED
POC Oxazepam (BZO): POSITIVE — AB
POC Oxycodone UR: NOT DETECTED
POC Secobarbital (BAR): NOT DETECTED

## 2022-07-01 LAB — ETHANOL: Alcohol, Ethyl (B): <10 mg/dL (ref <10)

## 2022-07-01 LAB — TSH: TSH: 2.439 u[IU]/mL (ref 0.350–4.500)

## 2022-07-01 MED ORDER — LORAZEPAM 1 MG PO TABS: 1.0000 mg | ORAL_TABLET | Freq: Four times a day (QID) | ORAL | Status: AC | PRN

## 2022-07-01 MED ORDER — TRAZODONE HCL 50 MG PO TABS
50.0000 mg | ORAL_TABLET | Freq: Every evening | ORAL | Status: DC | PRN
  Administered 2022-07-03 – 2022-07-04 (×2): 50 mg via ORAL
  Filled 2022-07-01 (×2): qty 1

## 2022-07-01 MED ORDER — LOPERAMIDE HCL 2 MG PO CAPS: 2.0000 mg | ORAL_CAPSULE | ORAL | Status: AC | PRN

## 2022-07-01 MED ORDER — HYDROXYZINE HCL 25 MG PO TABS
25.0000 mg | ORAL_TABLET | Freq: Four times a day (QID) | ORAL | Status: AC | PRN
  Administered 2022-07-02 – 2022-07-03 (×3): 25 mg via ORAL
  Filled 2022-07-01 (×4): qty 1

## 2022-07-01 MED ORDER — ACETAMINOPHEN 325 MG PO TABS
650.0000 mg | ORAL_TABLET | Freq: Four times a day (QID) | ORAL | Status: DC | PRN
  Administered 2022-07-02 – 2022-07-03 (×2): 650 mg via ORAL
  Filled 2022-07-01 (×2): qty 2

## 2022-07-01 MED ORDER — LORAZEPAM 1 MG PO TABS
1.0000 mg | ORAL_TABLET | Freq: Three times a day (TID) | ORAL | Status: AC
  Administered 2022-07-03 (×3): 1 mg via ORAL
  Filled 2022-07-01 (×3): qty 1

## 2022-07-01 MED ORDER — LORAZEPAM 1 MG PO TABS
1.0000 mg | ORAL_TABLET | Freq: Four times a day (QID) | ORAL | Status: AC
  Administered 2022-07-01 – 2022-07-02 (×5): 1 mg via ORAL
  Filled 2022-07-01 (×5): qty 1

## 2022-07-01 MED ORDER — LORAZEPAM 1 MG PO TABS
1.0000 mg | ORAL_TABLET | Freq: Two times a day (BID) | ORAL | Status: DC
  Filled 2022-07-01: qty 1

## 2022-07-01 MED ORDER — MAGNESIUM HYDROXIDE 400 MG/5ML PO SUSP: 30.0000 mL | Freq: Every day | ORAL | Status: DC | PRN

## 2022-07-01 MED ORDER — THIAMINE MONONITRATE 100 MG PO TABS
100.0000 mg | ORAL_TABLET | Freq: Every day | ORAL | Status: DC
  Administered 2022-07-02 – 2022-07-05 (×4): 100 mg via ORAL
  Filled 2022-07-01 (×4): qty 1

## 2022-07-01 MED ORDER — THIAMINE HCL 100 MG/ML IJ SOLN
100.0000 mg | Freq: Once | INTRAMUSCULAR | Status: AC
  Administered 2022-07-01: 100 mg via INTRAMUSCULAR
  Filled 2022-07-01: qty 2

## 2022-07-01 MED ORDER — ALUM & MAG HYDROXIDE-SIMETH 200-200-20 MG/5ML PO SUSP: 30.0000 mL | ORAL | Status: DC | PRN

## 2022-07-01 MED ORDER — ONDANSETRON 4 MG PO TBDP
4.0000 mg | ORAL_TABLET | Freq: Four times a day (QID) | ORAL | Status: AC | PRN
  Administered 2022-07-03: 4 mg via ORAL
  Filled 2022-07-01: qty 1

## 2022-07-01 MED ORDER — ADULT MULTIVITAMIN W/MINERALS CH
1.0000 | ORAL_TABLET | Freq: Every day | ORAL | Status: DC
  Administered 2022-07-01 – 2022-07-05 (×5): 1 via ORAL
  Filled 2022-07-01 (×5): qty 1

## 2022-07-01 MED ORDER — LORAZEPAM 1 MG PO TABS: 1.0000 mg | ORAL_TABLET | Freq: Every day | ORAL | Status: DC

## 2022-07-01 MED ORDER — GABAPENTIN 600 MG PO TABS
300.0000 mg | ORAL_TABLET | Freq: Three times a day (TID) | ORAL | Status: DC
  Administered 2022-07-01 – 2022-07-03 (×7): 300 mg via ORAL
  Filled 2022-07-01 (×8): qty 1
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
## 2022-07-01 NOTE — ED Provider Notes (Signed)
Facility Based Crisis Admission [name]  Date: 07/01/22 Patient Name: Austin [name] MRN: [id number] Chief Complaint: "detox"   Diagnoses:  Final diagnoses:  Alcohol abuse  Bereavement   HPI: Pt presents voluntarily to [name] for walk-in assessment.  Pt is accompanied by his cousin who he describes is like his sister, [place] [name]. Queen stays with pt throughout the assessment as per pt verbal consent/request. Pt is assessed face-to-face by nurse practitioner.   Austin [name], 57 y.o., male patient seen face to face by this provider; and chart reviewed on 07/01/22.  On evaluation, when asked reason for presenting today, Austin [name] reports "detox". Pt reports he has been drinking alcohol daily for over 10 years. He states his girlfriend passed away on July 06, 2022 and his drinking has increased. He has been drinking 1/5 vodka daily since she passed away. Prior to her passing, he was drinking 5 times/week, 1 pint vodka on the weekends and 1/2 pint vodka 3 weekdays a week. His last drink was 10:30AM today when he had 1/3 pint vodka. Pt denies history of withdrawal seizures of delirium tremens. Pt reports use of marijuana once every other month. His last use of marijuana was this past weekend when he had "1 pull off a joint". Pt denies use of crack/cocaine, methamphetamine, opioids, benzodiazepines, other substances.  Pt reports poor appetite and poor sleep. He reports he sleeps maybe 3 hours/night, feels tired during the day. He lives at home by himself, attends to his own ADLs. 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  Pt denies suicidal, homicidal or violent ideations. He denies auditory visual hallucinations or paranoia.   Pt denies history of inpatient psychiatric hospitalization, suicide attempt or non suicidal self injurious behavior.  Pt denies taking daily medications.  Pt denies he is connected with counseling or psychiatric medication management.  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  Pt's highest level of education is the 12th grade.  Pt denies family psychiatric history.  Pt denies access to a firearm or other weapon.  Pt denies pending charges or court dates.  Pt reports he has strong support system. He states family is very supportive.  Pt states his plan is to detox from alcohol and then follow up outpatient. He is hoping to follow up with [facility] program.   [place] 2-9:  Austin [name] from 07/01/2022 in [facility]  Thoughts that you would be better off dead, or of hurting yourself in some way Several days  PHQ-9 Total Score 20       [place] [name] from 07/01/2022 in [place] [name] from 06/29/2022 in [place] [name] from 05/18/2022 in [name]        Total Time spent with patient: 30 minutes  Musculoskeletal  Strength & Muscle Tone: within normal limits Gait & Station:  hesitant Patient leans: N/A  Psychiatric Specialty Exam  Presentation General Appearance:  Appropriate for Environment; Fairly Groomed; Casual  Eye Contact: Other (comment)  Speech: Clear and Coherent; Normal Rate  Speech Volume: Normal  Handedness: Right   Mood and Affect  Mood: Depressed  Affect: Flat   Thought Process  Thought Processes: Coherent; Goal Directed  Descriptions of Associations:Intact  Orientation:Full (Time, Place and Person)  Thought Content:Logical  Diagnosis of Schizophrenia or Schizoaffective disorder  in past: No   Hallucinations:Hallucinations: None  Ideas of Reference:None  Suicidal Thoughts:Suicidal Thoughts: No  Homicidal Thoughts:Homicidal Thoughts: No   Sensorium  Memory: Immediate Good; Recent Good  Judgment: Fair  Insight: Fair   Community education officer   Concentration: Fair  Attention Span: Fair  Recall: Good  Fund of Knowledge: Good  Language: Good   Psychomotor Activity  Psychomotor Activity: Psychomotor Activity: Normal   Assets  Assets: Communication Skills; Desire for Improvement; Financial Resources/Insurance; Housing; Leisure Time; Resilience; Social Support   Sleep  Sleep: Sleep: Poor Number of Hours of Sleep: 7   Nutritional Assessment (For OBS and FBC admissions only) Has the patient had a weight loss or gain of 10 pounds or more in the last 3 months?: Yes Has the patient had a decrease in food intake/or appetite?: Yes Does the patient have dental problems?: No Does the patient have eating habits or behaviors that may be indicators of an eating disorder including binging or inducing vomiting?: No Has the patient recently lost weight without trying?: 2.0 Has the patient been eating poorly because of a decreased appetite?: 1 Malnutrition Screening Tool Score: 3    Physical Exam Constitutional:      General: He is not in acute distress.    Appearance: He is not ill-appearing, toxic-appearing or diaphoretic.  Cardiovascular:     Rate and Rhythm: Normal rate.  Pulmonary:     Effort: Pulmonary effort is normal. No respiratory distress.  Neurological:     Mental Status: He is alert and oriented to person, place, and time.  Psychiatric:        Attention and Perception: Attention and perception normal.        Mood and Affect: Mood is depressed. Affect is flat.        Speech: Speech normal.        Behavior: Behavior normal. Behavior is cooperative.        Thought Content: Thought content normal.        Cognition and Memory: Cognition and memory normal.        Judgment: Judgment normal.    Review of Systems  Constitutional:  Negative for chills and fever.  Respiratory:  Negative for shortness of breath.   Cardiovascular:  Negative for chest pain and palpitations.  Gastrointestinal:  Positive for  nausea. Negative for vomiting.  Neurological:  Positive for tremors and headaches.  Psychiatric/Behavioral:  Positive for depression and substance abuse. Negative for hallucinations and suicidal ideas.     Blood pressure (!) 155/101, pulse 86, temperature 98.7 F (37.1 C), temperature source Oral, resp. rate 18, SpO2 100 %. There is no height or weight on file to calculate BMI.  Past Psychiatric History: Alcohol abuse, Anxiety, Alcohol-induced mood disorder with depressive symptoms   Is the patient at [name] to self? No  Has the patient been a [name] to self in the past 6 months? No .    Has the patient been a [name] to self within the distant past? No   Is the patient a [name] to others? No   Has the patient been a [name] to others in the past 6 months? No   Has the patient been a [name] to others within the distant past? 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, packing)  Last Labs:  Admission on 07/01/2022  Component  Date Value Ref Range Status   POC Amphetamine UR 07/01/2022 None Detected  NONE DETECTED (Cut Off Level 1000 ng/mL) Final   POC Secobarbital (BAR) 07/01/2022 None Detected  NONE DETECTED (Cut Off Level 300 ng/mL) Final   POC Buprenorphine (BUP) 07/01/2022 None Detected  NONE DETECTED (Cut Off Level 10 ng/mL) Final   POC Oxazepam (BZO) 07/01/2022 Positive (A)  NONE DETECTED (Cut Off Level 300 ng/mL) Final   POC Cocaine UR 07/01/2022 None Detected  NONE DETECTED (Cut Off Level 300 ng/mL) Final   POC Methamphetamine UR 07/01/2022 None Detected  NONE DETECTED (Cut Off Level 1000 ng/mL) Final   POC Morphine 07/01/2022 None Detected  NONE DETECTED (Cut Off Level 300 ng/mL) Final   POC Methadone UR 07/01/2022 None Detected  NONE DETECTED (Cut Off Level 300 ng/mL) Final   POC Oxycodone UR 07/01/2022  None Detected  NONE DETECTED (Cut Off Level 100 ng/mL) Final   POC Marijuana UR 07/01/2022 None Detected  NONE DETECTED (Cut Off Level 50 ng/mL) Final   SARSCOV2ONAVIRUS 2 AG 07/01/2022 NEGATIVE  NEGATIVE Final   Comment: (NOTE) SARS-CoV-2 antigen NOT DETECTED.   Negative results are presumptive.  Negative results do not preclude SARS-CoV-2 infection and should not be used as the sole basis for treatment or other patient management decisions, including infection  control decisions, particularly in the presence of clinical signs and  symptoms consistent with COVID-19, or in those who have been in contact with the virus.  Negative results must be combined with clinical observations, patient history, and epidemiological information. The expected result is Negative.  Fact Sheet for Patients: HandmadeRecipes.com.cy  Fact Sheet for Healthcare Providers: FuneralLife.at  This test is not yet approved or cleared by the [place] FDA and  has been authorized for detection and/or diagnosis of SARS-CoV-2 by FDA under an Emergency Use Authorization (EUA).  This EUA will remain in effect (meaning this test can be used) for the duration of  the COV                          ID-19 declaration under Section 564(b)(1) of the Act, 21 U.S.C. section 360bbb-3(b)(1), unless the authorization is terminated or revoked sooner.    Admission on 06/29/2022, Discharged on 06/30/2022  Component Date Value Ref Range Status   Sodium 06/29/2022 138  135 - 145 mmol/L Final   Potassium 06/29/2022 3.9  3.5 - 5.1 mmol/L Final   Chloride 06/29/2022 99  98 - 111 mmol/L Final   CO2 06/29/2022 28  22 - 32 mmol/L Final   Glucose, Bld 06/29/2022 81  70 - 99 mg/dL Final   Glucose reference range applies only to samples taken after fasting for at least 8 hours.   BUN 06/29/2022 9  6 - 20 mg/dL Final   Creatinine, Ser 06/29/2022 0.99  0.61 - 1.24 mg/dL Final   Calcium  06/29/2022 8.5 (L)  8.9 - 10.3 mg/dL Final   Total Protein 06/29/2022 6.7  6.5 - 8.1 g/dL Final   Albumin 06/29/2022 3.8  3.5 - 5.0 g/dL Final   AST 06/29/2022 207 ([name])  15 - 41 U/L Final   ALT 06/29/2022 191 ([name])  0 - 44 U/L Final   Alkaline Phosphatase 06/29/2022 138 ([name])  38 - 126 U/L Final   Total Bilirubin 06/29/2022 0.4  0.3 - 1.2 mg/dL Final   GFR, Estimated 06/29/2022 >60  >60 mL/min Final   Comment: (NOTE) Calculated using the CKD-EPI Creatinine Equation (2021)  Anion gap 06/29/2022 11  5 - 15 Final   Performed at [facility] [address], [place], [place] [address]   Alcohol, Ethyl (B) 06/29/2022 251 ([name])  <10 mg/dL Final   Comment: (NOTE) Lowest detectable limit for serum alcohol is 10 mg/dL.  For medical purposes only. Performed at [name], [place] [address] [place], [place] [address]    WBC 06/29/2022 3.2 (L)  4.0 - 10.5 K/uL Final   RBC 06/29/2022 4.33  4.22 - 5.81 MIL/uL Final   Hemoglobin 06/29/2022 14.2  13.0 - 17.0 g/dL Final   HCT 06/29/2022 39.4  39.0 - 52.0 % Final   MCV 06/29/2022 91.0  80.0 - 100.0 fL Final   MCH 06/29/2022 32.8  26.0 - 34.0 pg Final   MCHC 06/29/2022 36.0  30.0 - 36.0 g/dL Final   RDW 06/29/2022 13.2  11.5 - 15.5 % Final   Platelets 06/29/2022 125 (L)  150 - 400 K/uL Final   nRBC 06/29/2022 0.0  0.0 - 0.2 % Final   Neutrophils Relative % 06/29/2022 44  % Final   Neutro Abs 06/29/2022 1.4 (L)  1.7 - 7.7 K/uL Final   Lymphocytes Relative 06/29/2022 51  % Final   Lymphs Abs 06/29/2022 1.6  0.7 - 4.0 K/uL Final   Monocytes Relative 06/29/2022 4  % Final   Monocytes Absolute 06/29/2022 0.1  0.1 - 1.0 K/uL Final   Eosinophils Relative 06/29/2022 1  % Final   Eosinophils Absolute 06/29/2022 0.0  0.0 - 0.5 K/uL Final   Basophils Relative 06/29/2022 0  % Final   Basophils Absolute 06/29/2022 0.0  0.0 - 0.1 K/uL Final   WBC Morphology 06/29/2022 See Note   Final   ALLN   nRBC 06/29/2022 0  0 /100 WBC Final   Abs Immature  Granulocytes 06/29/2022 0.00  0.00 - 0.07 K/uL Final   Performed at [name], [place] [name] [place], [place] [address]   Acetaminophen (Tylenol), Serum 06/29/2022 <10 (L)  10 - 30 ug/mL Final   Comment: (NOTE) Therapeutic concentrations vary significantly. A range of 10-30 ug/mL  may be an effective concentration for many patients. However, some  are best treated at concentrations outside of this range. Acetaminophen concentrations >150 ug/mL at 4 hours after ingestion  and >50 ug/mL at 12 hours after ingestion are often associated with  toxic reactions.  Performed at [name], [place] [address], [place], [place] [address]    Salicylate Lvl 39/08/90 <7.0 (L)  7.0 - 30.0 mg/dL Final   Performed at [facility] [address] [place], [place] [address]   Troponin I (High Sensitivity) 06/29/2022 3  <18 ng/L Final   Comment: (NOTE) Elevated high sensitivity troponin I (hsTnI) values and significant  changes across serial measurements may suggest ACS but many other  chronic and acute conditions are known to elevate hsTnI results.  Refer to the "Links" section for chest pain algorithms and additional  guidance. Performed at [name], [place] [address], [place], [place] [address]    Troponin I (High Sensitivity) 06/29/2022 3  <18 ng/L Final   Comment: (NOTE) Elevated high sensitivity troponin I (hsTnI) values and significant  changes across serial measurements may suggest ACS but many other  chronic and acute conditions are known to elevate hsTnI results.  Refer to the "Links" section for chest pain algorithms and additional  guidance. Performed at [name], [place]  [address] [place], [place] [address]   Admission on 05/18/2022, Discharged on 05/18/2022  Component Date Value Ref Range Status   Sodium 05/18/2022 134 (L)  135 - 145 mmol/L Final   Potassium 05/18/2022 3.3 (L)  3.5 - 5.1 mmol/L Final   Chloride 05/18/2022 97 (L)  98 - 111  mmol/L Final   CO2 05/18/2022 26  22 - 32 mmol/L Final   Glucose, Bld 05/18/2022 127 ([name])  70 - 99 mg/dL Final   Glucose reference range applies only to samples taken after fasting for at least 8 hours.   BUN 05/18/2022 <5 (L)  6 - 20 mg/dL Final   Creatinine, Ser 05/18/2022 0.91  0.61 - 1.24 mg/dL Final   Calcium 05/18/2022 8.8 (L)  8.9 - 10.3 mg/dL Final   Total Protein 05/18/2022 6.7  6.5 - 8.1 g/dL Final   Albumin 05/18/2022 3.8  3.5 - 5.0 g/dL Final   AST 05/18/2022 637 ([name])  15 - 41 U/L Final   ALT 05/18/2022 255 ([name])  0 - 44 U/L Final   Alkaline Phosphatase 05/18/2022 102  38 - 126 U/L Final   Total Bilirubin 05/18/2022 0.7  0.3 - 1.2 mg/dL Final   GFR, Estimated 05/18/2022 >60  >60 mL/min Final   Comment: (NOTE) Calculated using the CKD-EPI Creatinine Equation (2021)    Anion gap 05/18/2022 11  5 - 15 Final   Performed at [facility] [address], [place], [place] [address]   Opiates 05/18/2022 NONE DETECTED  NONE DETECTED Final   Cocaine 05/18/2022 NONE DETECTED  NONE DETECTED Final   Benzodiazepines 05/18/2022 NONE DETECTED  NONE DETECTED Final   Amphetamines 05/18/2022 NONE DETECTED  NONE DETECTED Final   Tetrahydrocannabinol 05/18/2022 POSITIVE (A)  NONE DETECTED Final   Barbiturates 05/18/2022 NONE DETECTED  NONE DETECTED Final   Comment: (NOTE) DRUG SCREEN FOR MEDICAL PURPOSES ONLY.  IF CONFIRMATION IS NEEDED FOR ANY PURPOSE, NOTIFY LAB WITHIN 5 DAYS.  LOWEST DETECTABLE LIMITS FOR URINE DRUG SCREEN Drug Class                     Cutoff (ng/mL) Amphetamine and metabolites    1000 Barbiturate and metabolites    200 Benzodiazepine                 200 Opiates and metabolites        300 Cocaine and metabolites        300 THC                            50 Performed at [name], [place] [address], [place], [place] [address]    WBC 05/18/2022 1.7 (L)  4.0 - 10.5 K/uL Final   RBC 05/18/2022 4.81  4.22 - 5.81 MIL/uL Final   Hemoglobin 05/18/2022 15.8   13.0 - 17.0 g/dL Final   HCT 05/18/2022 42.8  39.0 - 52.0 % Final   MCV 05/18/2022 89.0  80.0 - 100.0 fL Final   MCH 05/18/2022 32.8  26.0 - 34.0 pg Final   MCHC 05/18/2022 36.9 ([name])  30.0 - 36.0 g/dL Final   RDW 05/18/2022 12.5  11.5 - 15.5 % Final   Platelets 05/18/2022 112 (L)  150 - 400 K/uL Final   nRBC 05/18/2022 0.0  0.0 - 0.2 % Final   Neutrophils Relative % 05/18/2022 26  % Final   Neutro Abs 05/18/2022 0.4 (LL)  1.7 - 7.7 K/uL  Final   Comment: REPEATED TO VERIFY THIS CRITICAL RESULT HAS VERIFIED AND BEEN CALLED TO BRITTANY BECK,RN BY ZELDA BEECH ON 12 12 2023 AT 1410, AND HAS BEEN READ BACK.     Lymphocytes Relative 05/18/2022 59  % Final   Lymphs Abs 05/18/2022 1.0  0.7 - 4.0 K/uL Final   Monocytes Relative 05/18/2022 13  % Final   Monocytes Absolute 05/18/2022 0.2  0.1 - 1.0 K/uL Final   Eosinophils Relative 05/18/2022 1  % Final   Eosinophils Absolute 05/18/2022 0.0  0.0 - 0.5 K/uL Final   Basophils Relative 05/18/2022 1  % Final   Basophils Absolute 05/18/2022 0.0  0.0 - 0.1 K/uL Final   WBC Morphology 05/18/2022 MORPHOLOGY UNREMARKABLE   Final   RBC Morphology 05/18/2022 MORPHOLOGY UNREMARKABLE   Final   Smear Review 05/18/2022 MORPHOLOGY UNREMARKABLE   Final   Immature Granulocytes 05/18/2022 0  % Final   Abs Immature Granulocytes 05/18/2022 0.00  0.00 - 0.07 K/uL Final   Performed at [name], [place] [address], [place], [place] [address]   Alcohol, Ethyl (B) 05/18/2022 105 ([name])  <10 mg/dL Final   Comment: (NOTE) Lowest detectable limit for serum alcohol is 10 mg/dL.  For medical purposes only. Performed at [name], [place] [address], [place],  [address]   Admission on 05/08/2022, Discharged on 05/08/2022  Component Date Value Ref Range Status   Total hemoglobin 05/08/2022 13.5  12.0 - 16.0 g/dL Final   O2 Saturation 05/08/2022 56.7  % Final   Carboxyhemoglobin 05/08/2022 4.1 ([name])  0.5 - 1.5 % Final   Methemoglobin 05/08/2022 0.7  0.0 - 1.5  % Final   Performed at [name], [place] [address], [place], [place] [address]   WBC 05/08/2022 4.2  4.0 - 10.5 K/uL Final   RBC 05/08/2022 3.89 (L)  4.22 - 5.81 MIL/uL Final   Hemoglobin 05/08/2022 13.0  13.0 - 17.0 g/dL Final   HCT 05/08/2022 36.9 (L)  39.0 - 52.0 % Final   MCV 05/08/2022 94.9  80.0 - 100.0 fL Final   MCH 05/08/2022 33.4  26.0 - 34.0 pg Final   MCHC 05/08/2022 35.2  30.0 - 36.0 g/dL Final   RDW 05/08/2022 13.8  11.5 - 15.5 % Final   Platelets 05/08/2022 89 (L)  150 - 400 K/uL Final   Comment: Immature Platelet Fraction may be clinically indicated, consider ordering this additional test [id number] REPEATED TO VERIFY PLATELET COUNT CONFIRMED BY SMEAR    nRBC 05/08/2022 0.0  0.0 - 0.2 % Final   Performed at [name], [place] [address], [place], [place] [address]   Sodium 05/08/2022 140  135 - 145 mmol/L Final   Potassium 05/08/2022 3.8  3.5 - 5.1 mmol/L Final   Chloride 05/08/2022 103  98 - 111 mmol/L Final   CO2 05/08/2022 25  22 - 32 mmol/L Final   Glucose, Bld 05/08/2022 92  70 - 99 mg/dL Final   Glucose reference range applies only to samples taken after fasting for at least 8 hours.   BUN 05/08/2022 12  6 - 20 mg/dL Final   Creatinine, Ser 05/08/2022 1.00  0.61 - 1.24 mg/dL Final   Calcium 05/08/2022 8.9  8.9 - 10.3 mg/dL Final   Total Protein 05/08/2022 6.1 (L)  6.5 - 8.1 g/dL Final   Albumin 05/08/2022 3.7  3.5 - 5.0 g/dL Final   AST 05/08/2022 45 ([name])  15 - 41 U/L Final  ALT 05/08/2022 35  0 - 44 U/L Final   Alkaline Phosphatase 05/08/2022 75  38 - 126 U/L Final   Total Bilirubin 05/08/2022 0.7  0.3 - 1.2 mg/dL Final   GFR, Estimated 05/08/2022 >60  >60 mL/min Final   Comment: (NOTE) Calculated using the CKD-EPI Creatinine Equation (2021)    Anion gap 05/08/2022 12  5 - 15 Final   Performed at [name], [place] [address] [place], [place] [address]   SARS Coronavirus 2 by RT PCR 05/08/2022 NEGATIVE  NEGATIVE Final   Comment:  (NOTE) SARS-CoV-2 target nucleic acids are NOT DETECTED.  The SARS-CoV-2 RNA is generally detectable in upper respiratory specimens during the acute phase of infection. The lowest concentration of SARS-CoV-2 viral copies this assay can detect is 138 copies/mL. A negative result does not preclude SARS-Cov-2 infection and should not be used as the sole basis for treatment or other patient management decisions. A negative result may occur with  improper specimen collection/handling, submission of specimen other than nasopharyngeal swab, presence of viral mutation(s) within the areas targeted by this assay, and inadequate number of viral copies(<138 copies/mL). A negative result must be combined with clinical observations, patient history, and epidemiological information. The expected result is Negative.  Fact Sheet for Patients:  EntrepreneurPulse.com.au  Fact Sheet for Healthcare Providers:  IncredibleEmployment.be  This test is no                          t yet approved or cleared by the [place] FDA and  has been authorized for detection and/or diagnosis of SARS-CoV-2 by FDA under an Emergency Use Authorization (EUA). This EUA will remain  in effect (meaning this test can be used) for the duration of the COVID-19 declaration under Section 564(b)(1) of the Act, 21 U.S.C.section 360bbb-3(b)(1), unless the authorization is terminated  or revoked sooner.       Influenza A by PCR 05/08/2022 NEGATIVE  NEGATIVE Final   Influenza B by PCR 05/08/2022 NEGATIVE  NEGATIVE Final   Comment: (NOTE) The Xpert Xpress SARS-CoV-2/FLU/RSV plus assay is intended as an aid in the diagnosis of influenza from Nasopharyngeal swab specimens and should not be used as a sole basis for treatment. Nasal washings and aspirates are unacceptable for Xpert Xpress SARS-CoV-2/FLU/RSV testing.  Fact Sheet for Patients: EntrepreneurPulse.com.au  Fact  Sheet for Healthcare Providers: IncredibleEmployment.be  This test is not yet approved or cleared by the [place] FDA and has been authorized for detection and/or diagnosis of SARS-CoV-2 by FDA under an Emergency Use Authorization (EUA). This EUA will remain in effect (meaning this test can be used) for the duration of the COVID-19 declaration under Section 564(b)(1) of the Act, 21 U.S.C. section 360bbb-3(b)(1), unless the authorization is terminated or revoked.  Performed at [name], [place] [address] [place], [place] [address]   Admission on 03/11/2022, Discharged on 03/12/2022  Component Date Value Ref Range Status   Sodium 03/11/2022 132 (L)  135 - 145 mmol/L Final   Potassium 03/11/2022 3.6  3.5 - 5.1 mmol/L Final   Chloride 03/11/2022 95 (L)  98 - 111 mmol/L Final   CO2 03/11/2022 24  22 - 32 mmol/L Final   Glucose, Bld 03/11/2022 119 ([name])  70 - 99 mg/dL Final   Glucose reference range applies only to samples taken after fasting for at least 8 hours.   BUN 03/11/2022 6  6 - 20 mg/dL Final   Creatinine,  Ser 03/11/2022 1.12  0.61 - 1.24 mg/dL Final   Calcium 03/11/2022 9.1  8.9 - 10.3 mg/dL Final   Total Protein 03/11/2022 6.9  6.5 - 8.1 g/dL Final   Albumin 03/11/2022 4.3  3.5 - 5.0 g/dL Final   AST 03/11/2022 72 ([name])  15 - 41 U/L Final   ALT 03/11/2022 41  0 - 44 U/L Final   Alkaline Phosphatase 03/11/2022 102  38 - 126 U/L Final   Total Bilirubin 03/11/2022 0.8  0.3 - 1.2 mg/dL Final   GFR, Estimated 03/11/2022 >60  >60 mL/min Final   Comment: (NOTE) Calculated using the CKD-EPI Creatinine Equation (2021)    Anion gap 03/11/2022 13  5 - 15 Final   Performed at [facility], [address], [place], [place] [address]   WBC 03/11/2022 4.2  4.0 - 10.5 K/uL Final   RBC 03/11/2022 4.40  4.22 - 5.81 MIL/uL Final   Hemoglobin 03/11/2022 14.0  13.0 - 17.0 g/dL Final   HCT 03/11/2022 39.6  39.0 - 52.0 % Final   MCV 03/11/2022  90.0  80.0 - 100.0 fL Final   MCH 03/11/2022 31.8  26.0 - 34.0 pg Final   MCHC 03/11/2022 35.4  30.0 - 36.0 g/dL Final   RDW 03/11/2022 13.4  11.5 - 15.5 % Final   Platelets 03/11/2022 124 (L)  150 - 400 K/uL Final   nRBC 03/11/2022 0.0  0.0 - 0.2 % Final   Neutrophils Relative % 03/11/2022 45  % Final   Neutro Abs 03/11/2022 1.9  1.7 - 7.7 K/uL Final   Lymphocytes Relative 03/11/2022 38  % Final   Lymphs Abs 03/11/2022 1.6  0.7 - 4.0 K/uL Final   Monocytes Relative 03/11/2022 16  % Final   Monocytes Absolute 03/11/2022 0.7  0.1 - 1.0 K/uL Final   Eosinophils Relative 03/11/2022 1  % Final   Eosinophils Absolute 03/11/2022 0.0  0.0 - 0.5 K/uL Final   Basophils Relative 03/11/2022 0  % Final   Basophils Absolute 03/11/2022 0.0  0.0 - 0.1 K/uL Final   Immature Granulocytes 03/11/2022 0  % Final   Abs Immature Granulocytes 03/11/2022 0.00  0.00 - 0.07 K/uL Final   Performed at [facility], [address], [place], [place] [address]   Lipase 03/11/2022 15  11 - 51 U/L Final   Performed at [facility], [address], [place], [place] [address]   Color, Urine 03/11/2022 YELLOW  YELLOW Final   APPearance 03/11/2022 CLEAR  CLEAR Final   Specific Gravity, Urine 03/11/2022 1.028  1.005 - 1.030 Final   pH 03/11/2022 5.5  5.0 - 8.0 Final   Glucose, UA 03/11/2022 NEGATIVE  NEGATIVE mg/dL Final   Hgb urine dipstick 03/11/2022 LARGE (A)  NEGATIVE Final   Bilirubin Urine 03/11/2022 NEGATIVE  NEGATIVE Final   Ketones, ur 03/11/2022 NEGATIVE  NEGATIVE mg/dL Final   Protein, ur 03/11/2022 TRACE (A)  NEGATIVE mg/dL Final   Nitrite 03/11/2022 NEGATIVE  NEGATIVE Final   Leukocytes,Ua 03/11/2022 NEGATIVE  NEGATIVE Final   RBC / HPF 03/11/2022 0-5  0 - 5 RBC/hpf Final   WBC, UA 03/11/2022 0-5  0 - 5 WBC/hpf Final   Mucus 03/11/2022 PRESENT   Final   Hyaline Casts, UA 03/11/2022 PRESENT   Final   Performed at [facility], [address] [place] [address] [place], [place] [address]   Alcohol, Ethyl (B) 03/11/2022 63 ([name])  <10 mg/dL Final   Comment: (NOTE) Lowest detectable limit for  serum alcohol is 10 mg/dL.  For medical purposes only. Performed at [facility], [address], [place], [place] [address]    Salicylate Lvl 50/93/2671 <7.0 (L)  7.0 - 30.0 mg/dL Final   Performed at [facility], [place], [place] [address]   Acetaminophen (Tylenol), Serum 03/11/2022 <10 (L)  10 - 30 ug/mL Final   Comment: (NOTE) Therapeutic concentrations vary significantly. A range of 10-30 ug/mL  may be an effective concentration for many patients. However, some  are best treated at concentrations outside of this range. Acetaminophen concentrations >150 ug/mL at 4 hours after ingestion  and >50 ug/mL at 12 hours after ingestion are often associated with  toxic reactions.  Performed at [facility], [place], [place] [address]    Troponin I (High Sensitivity) 03/11/2022 <2  <18 ng/L Final   Comment: (NOTE) Elevated high sensitivity troponin I (hsTnI) values and significant  changes across serial measurements may suggest ACS but many other  chronic and acute conditions are known to elevate hsTnI results.  Refer to the "Links" section for chest pain algorithms and additional  guidance. Performed at [facility], [address] [place],  [address]    SARS Coronavirus 2 by RT PCR 03/11/2022 NEGATIVE  NEGATIVE Final   Comment: (NOTE) SARS-CoV-2 target nucleic acids are NOT DETECTED.  The SARS-CoV-2 RNA is generally detectable in upper respiratory specimens during the acute phase of infection. The lowest concentration of SARS-CoV-2 viral copies this assay can detect is 138 copies/mL. A negative result does not preclude SARS-Cov-2 infection and should not be used as the sole basis for treatment or other patient management  decisions. A negative result may occur with  improper specimen collection/handling, submission of specimen other than nasopharyngeal swab, presence of viral mutation(s) within the areas targeted by this assay, and inadequate number of viral copies(<138 copies/mL). A negative result must be combined with clinical observations, patient history, and epidemiological information. The expected result is Negative.  Fact Sheet for Patients:  EntrepreneurPulse.com.au  Fact Sheet for Healthcare Providers:  IncredibleEmployment.be  This test is no                          t yet approved or cleared by the [place] FDA and  has been authorized for detection and/or diagnosis of SARS-CoV-2 by FDA under an Emergency Use Authorization (EUA). This EUA will remain  in effect (meaning this test can be used) for the duration of the COVID-19 declaration under Section 564(b)(1) of the Act, 21 U.S.C.section 360bbb-3(b)(1), unless the authorization is terminated  or revoked sooner.       Influenza A by PCR 03/11/2022 NEGATIVE  NEGATIVE Final   Influenza B by PCR 03/11/2022 NEGATIVE  NEGATIVE Final   Comment: (NOTE) The Xpert Xpress SARS-CoV-2/FLU/RSV plus assay is intended as an aid in the diagnosis of influenza from Nasopharyngeal swab specimens and should not be used as a sole basis for treatment. Nasal washings and aspirates are unacceptable for Xpert Xpress SARS-CoV-2/FLU/RSV testing.  Fact Sheet for Patients: EntrepreneurPulse.com.au  Fact Sheet for Healthcare Providers: IncredibleEmployment.be  This test is not yet approved or cleared by the [place] FDA and has been authorized for detection and/or diagnosis of SARS-CoV-2 by FDA under an Emergency Use Authorization (EUA). This EUA will remain in effect (meaning this test can be used) for the duration of the COVID-19 declaration under Section 564(b)(1) of the Act,  21 U.S.C. section 360bbb-3(b)(1), unless the authorization is terminated or revoked.  Performed at [facility], [address] [place], [place] [address]    Opiates 03/11/2022 POSITIVE (A)  NONE DETECTED Final   Cocaine 03/11/2022 NONE DETECTED  NONE DETECTED Final   Benzodiazepines 03/11/2022 NONE DETECTED  NONE DETECTED Final   Amphetamines 03/11/2022 NONE DETECTED  NONE DETECTED Final   Tetrahydrocannabinol 03/11/2022 NONE DETECTED  NONE DETECTED Final   Barbiturates 03/11/2022 NONE DETECTED  NONE DETECTED Final   Comment: (NOTE) DRUG SCREEN FOR MEDICAL PURPOSES ONLY.  IF CONFIRMATION IS NEEDED FOR ANY PURPOSE, NOTIFY LAB WITHIN 5 DAYS.  LOWEST DETECTABLE LIMITS FOR URINE DRUG SCREEN Drug Class                     Cutoff (ng/mL) Amphetamine and metabolites    1000 Barbiturate and metabolites    200 Benzodiazepine                 572 Tricyclics and metabolites     300 Opiates and metabolites        300 Cocaine and metabolites        300 THC                            50 Performed at [facility], [address] [place], [place] [address]   Admission on 03/10/2022, Discharged on 03/11/2022  Component Date Value Ref Range Status   WBC 03/10/2022 4.3  4.0 - 10.5 K/uL Final   RBC 03/10/2022 4.47  4.22 - 5.81 MIL/uL Final   Hemoglobin 03/10/2022 14.5  13.0 - 17.0 g/dL Final   HCT 03/10/2022 40.0  39.0 - 52.0 % Final   MCV 03/10/2022 89.5  80.0 - 100.0 fL Final   MCH 03/10/2022 32.4  26.0 - 34.0 pg Final   MCHC 03/10/2022 36.3 ([name])  30.0 - 36.0 g/dL Final   RDW 03/10/2022 13.7  11.5 - 15.5 % Final   Platelets 03/10/2022 135 (L)  150 - 400 K/uL Final   nRBC 03/10/2022 0.0  0.0 - 0.2 % Final   Neutrophils Relative % 03/10/2022 31  % Final   Neutro Abs 03/10/2022 1.3 (L)  1.7 - 7.7 K/uL Final   Lymphocytes Relative 03/10/2022 52  % Final   Lymphs Abs 03/10/2022 2.2  0.7 - 4.0 K/uL Final   Monocytes Relative 03/10/2022 15  % Final    Monocytes Absolute 03/10/2022 0.7  0.1 - 1.0 K/uL Final   Eosinophils Relative 03/10/2022 1  % Final   Eosinophils Absolute 03/10/2022 0.1  0.0 - 0.5 K/uL Final   Basophils Relative 03/10/2022 1  % Final   Basophils Absolute 03/10/2022 0.0  0.0 - 0.1 K/uL Final   Immature Granulocytes 03/10/2022 0  % Final   Abs Immature Granulocytes 03/10/2022 0.01  0.00 - 0.07 K/uL Final   Performed at [name], [place] [address], [place], [place] [address]   Sodium 03/10/2022 136  135 - 145 mmol/L Final   Potassium 03/10/2022 3.5  3.5 - 5.1 mmol/L Final   Chloride 03/10/2022 98  98 - 111 mmol/L Final   CO2 03/10/2022 24  22 - 32 mmol/L Final   Glucose, Bld 03/10/2022 115 ([name])  70 - 99 mg/dL Final   Glucose reference range applies only to samples taken after fasting for at least 8 hours.   BUN 03/10/2022 7  6 - 20 mg/dL Final  Creatinine, Ser 03/10/2022 1.12  0.61 - 1.24 mg/dL Final   Calcium 03/10/2022 8.8 (L)  8.9 - 10.3 mg/dL Final   Total Protein 03/10/2022 7.1  6.5 - 8.1 g/dL Final   Albumin 03/10/2022 3.9  3.5 - 5.0 g/dL Final   AST 03/10/2022 86 ([name])  15 - 41 U/L Final   ALT 03/10/2022 48 ([name])  0 - 44 U/L Final   Alkaline Phosphatase 03/10/2022 101  38 - 126 U/L Final   Total Bilirubin 03/10/2022 1.0  0.3 - 1.2 mg/dL Final   GFR, Estimated 03/10/2022 >60  >60 mL/min Final   Comment: (NOTE) Calculated using the CKD-EPI Creatinine Equation (2021)    Anion gap 03/10/2022 14  5 - 15 Final   Performed at [facility] [address], [place], [place] [address]   Lipase 03/10/2022 30  11 - 51 U/L Final   Performed at [facility] [address] [place], [place] [address]   Acetaminophen (Tylenol), Serum 03/10/2022 <10 (L)  10 - 30 ug/mL Final   Comment: (NOTE) Therapeutic concentrations vary significantly. A range of 10-30 ug/mL  may be an effective concentration for many patients. However, some  are best treated at concentrations outside of this range. Acetaminophen  concentrations >150 ug/mL at 4 hours after ingestion  and >50 ug/mL at 12 hours after ingestion are often associated with  toxic reactions.  Performed at [name], [place] [address] [place], [place] [address]    Salicylate Lvl 03/50/0938 <7.0 (L)  7.0 - 30.0 mg/dL Final   Performed at [facility] [address], [place], [place] [address]   Alcohol, Ethyl (B) 03/10/2022 227 ([name])  <10 mg/dL Final   Comment: (NOTE) Lowest detectable limit for serum alcohol is 10 mg/dL.  For medical purposes only. Performed at [name], [place] [address], [place], [place] [address]   Admission on 03/03/2022, Discharged on 03/04/2022  Component Date Value Ref Range Status   Sodium 03/03/2022 137  135 - 145 mmol/L Final   Potassium 03/03/2022 4.1  3.5 - 5.1 mmol/L Final   Chloride 03/03/2022 103  98 - 111 mmol/L Final   CO2 03/03/2022 26  22 - 32 mmol/L Final   Glucose, Bld 03/03/2022 99  70 - 99 mg/dL Final   Glucose reference range applies only to samples taken after fasting for at least 8 hours.   BUN 03/03/2022 11  6 - 20 mg/dL Final   Creatinine, Ser 03/03/2022 0.88  0.61 - 1.24 mg/dL Final   Calcium 03/03/2022 8.6 (L)  8.9 - 10.3 mg/dL Final   Total Protein 03/03/2022 7.1  6.5 - 8.1 g/dL Final   Albumin 03/03/2022 3.8  3.5 - 5.0 g/dL Final   AST 03/03/2022 47 ([name])  15 - 41 U/L Final   ALT 03/03/2022 36  0 - 44 U/L Final   Alkaline Phosphatase 03/03/2022 81  38 - 126 U/L Final   Total Bilirubin 03/03/2022 0.8  0.3 - 1.2 mg/dL Final   GFR, Estimated 03/03/2022 >60  >60 mL/min Final   Comment: (NOTE) Calculated using the CKD-EPI Creatinine Equation (2021)    Anion gap 03/03/2022 8  5 - 15 Final   Performed at [facility], [name] [address], [place], [place] [address]   Alcohol, Ethyl (B) 03/03/2022 280 ([name])  <10 mg/dL Final   Comment: (NOTE) Lowest detectable limit for serum alcohol is 10 mg/dL.  For medical purposes only. Performed at  [facility], [place] [address] [place], [place] [address]    Salicylate Lvl 16/12/3708 <7.0 (L)  7.0 - 30.0 mg/dL Final   Performed at [facility] [address] [place], [place] [address]   Acetaminophen (Tylenol), Serum 03/03/2022 <10 (L)  10 - 30 ug/mL Final   Comment: (NOTE) Therapeutic concentrations vary significantly. A range of 10-30 ug/mL  may be an effective concentration for many patients. However, some  are best treated at concentrations outside of this range. Acetaminophen concentrations >150 ug/mL at 4 hours after ingestion  and >50 ug/mL at 12 hours after ingestion are often associated with  toxic reactions.  Performed at [name], [place] [address], [place], [place] [address]    WBC 03/03/2022 4.8  4.0 - 10.5 K/uL Final   RBC 03/03/2022 4.39  4.22 - 5.81 MIL/uL Final   Hemoglobin 03/03/2022 14.0  13.0 - 17.0 g/dL Final   HCT 03/03/2022 40.0  39.0 - 52.0 % Final   MCV 03/03/2022 91.1  80.0 - 100.0 fL Final   MCH 03/03/2022 31.9  26.0 - 34.0 pg Final   MCHC 03/03/2022 35.0  30.0 - 36.0 g/dL Final   RDW 03/03/2022 13.2  11.5 - 15.5 % Final   Platelets 03/03/2022 152  150 - 400 K/uL Final   nRBC 03/03/2022 0.0  0.0 - 0.2 % Final   Performed at [facility], [place] [address], [place], [place] [address]   Opiates 03/03/2022 NONE DETECTED  NONE DETECTED Final   Cocaine 03/03/2022 NONE DETECTED  NONE DETECTED Final   Benzodiazepines 03/03/2022 NONE DETECTED  NONE DETECTED Final   Amphetamines 03/03/2022 NONE DETECTED  NONE DETECTED Final   Tetrahydrocannabinol 03/03/2022 NONE DETECTED  NONE DETECTED Final   Barbiturates 03/03/2022 NONE DETECTED  NONE DETECTED Final   Comment: (NOTE) DRUG SCREEN FOR MEDICAL PURPOSES ONLY.  IF CONFIRMATION IS NEEDED FOR ANY PURPOSE, NOTIFY LAB WITHIN 5 DAYS.  LOWEST DETECTABLE LIMITS FOR URINE DRUG SCREEN Drug Class                     Cutoff (ng/mL) Amphetamine and  metabolites    1000 Barbiturate and metabolites    200 Benzodiazepine                 093 Tricyclics and metabolites     300 Opiates and metabolites        300 Cocaine and metabolites        300 THC                            50 Performed at [name], [place] [address], [place], [place] [address]    SARS Coronavirus 2 by RT PCR 03/03/2022 NEGATIVE  NEGATIVE Final   Comment: (NOTE) SARS-CoV-2 target nucleic acids are NOT DETECTED.  The SARS-CoV-2 RNA is generally detectable in upper and lower respiratory specimens during the acute phase of infection. The lowest concentration of SARS-CoV-2 viral copies this assay can detect is 250 copies / mL. A negative result does not preclude SARS-CoV-2 infection and should not be used as the sole basis for treatment or other patient management decisions.  A negative result may occur with improper specimen collection / handling, submission of specimen other than nasopharyngeal swab, presence of viral mutation(s) within the areas targeted by this assay, and inadequate number of viral copies (<250 copies / mL). A negative  result must be combined with clinical observations, patient history, and epidemiological information.  Fact Sheet for Patients:   https://www.patel.info/  Fact Sheet for Healthcare Providers: https://hall.com/  This test is not yet approved or                           cleared by the [place] FDA and has been authorized for detection and/or diagnosis of SARS-CoV-2 by FDA under an Emergency Use Authorization (EUA).  This EUA will remain in effect (meaning this test can be used) for the duration of the COVID-19 declaration under Section 564(b)(1) of the Act, 21 U.S.C. section 360bbb-3(b)(1), unless the authorization is terminated or revoked sooner.  Performed at [name], [place] [address] [place], [place] [address]    Lipase 03/03/2022  31  11 - 51 U/L Final   Performed at [facility], [place] [address], [place], [place] [address]  Admission on 01/12/2022, Discharged on 01/12/2022  Component Date Value Ref Range Status   WBC 01/12/2022 3.1 (L)  4.0 - 10.5 K/uL Final   RBC 01/12/2022 4.77  4.22 - 5.81 MIL/uL Final   Hemoglobin 01/12/2022 15.0  13.0 - 17.0 g/dL Final   HCT 01/12/2022 43.3  39.0 - 52.0 % Final   MCV 01/12/2022 90.8  80.0 - 100.0 fL Final   MCH 01/12/2022 31.4  26.0 - 34.0 pg Final   MCHC 01/12/2022 34.6  30.0 - 36.0 g/dL Final   RDW 01/12/2022 12.7  11.5 - 15.5 % Final   Platelets 01/12/2022 107 (L)  150 - 400 K/uL Final   Comment: Immature Platelet Fraction may be clinically indicated, consider ordering this additional test [id number] CONSISTENT WITH PREVIOUS RESULT REPEATED TO VERIFY    nRBC 01/12/2022 0.0  0.0 - 0.2 % Final   Neutrophils Relative % 01/12/2022 40  % Final   Neutro Abs 01/12/2022 1.2 (L)  1.7 - 7.7 K/uL Final   Lymphocytes Relative 01/12/2022 47  % Final   Lymphs Abs 01/12/2022 1.4  0.7 - 4.0 K/uL Final   Monocytes Relative 01/12/2022 11  % Final   Monocytes Absolute 01/12/2022 0.4  0.1 - 1.0 K/uL Final   Eosinophils Relative 01/12/2022 1  % Final   Eosinophils Absolute 01/12/2022 0.0  0.0 - 0.5 K/uL Final   Basophils Relative 01/12/2022 1  % Final   Basophils Absolute 01/12/2022 0.0  0.0 - 0.1 K/uL Final   Immature Granulocytes 01/12/2022 0  % Final   Abs Immature Granulocytes 01/12/2022 0.00  0.00 - 0.07 K/uL Final   Performed at [place] [name], [place] [address], [place], [place] [address]   Sodium 01/12/2022 136  135 - 145 mmol/L Final   Potassium 01/12/2022 3.2 (L)  3.5 - 5.1 mmol/L Final   Chloride 01/12/2022 104  98 - 111 mmol/L Final   CO2 01/12/2022 25  22 - 32 mmol/L Final   Glucose, Bld 01/12/2022 96  70 - 99 mg/dL Final   Glucose reference range applies only to samples taken after fasting for at least 8 hours.   BUN 01/12/2022 5 (L)  6 -  20 mg/dL Final   Creatinine, Ser 01/12/2022 0.99  0.61 - 1.24 mg/dL Final   Calcium 01/12/2022 8.3 (L)  8.9 - 10.3 mg/dL Final   Total Protein 01/12/2022 6.4 (L)  6.5 - 8.1 g/dL Final   Albumin 01/12/2022 3.6  3.5 - 5.0 g/dL Final   AST 01/12/2022 66 ([name])  15 - 41 U/L Final   ALT 01/12/2022 32  0 - 44 U/L Final   Alkaline Phosphatase 01/12/2022 71  38 - 126 U/L Final   Total Bilirubin 01/12/2022 1.0  0.3 - 1.2 mg/dL Final   GFR, Estimated 01/12/2022 >60  >60 mL/min Final   Comment: (NOTE) Calculated using the CKD-EPI Creatinine Equation (2021)    Anion gap 01/12/2022 7  5 - 15 Final   Performed at [name], [place] [address] [place], [place] [address]   Lipase 01/12/2022 25  11 - 51 U/L Final   Performed at [facility], [place] [address] [place], [place] [address]   Color, Urine 01/12/2022 STRAW (A)  YELLOW Final   APPearance 01/12/2022 CLEAR  CLEAR Final   Specific Gravity, Urine 01/12/2022 1.020  1.005 - 1.030 Final   pH 01/12/2022 7.0  5.0 - 8.0 Final   Glucose, UA 01/12/2022 NEGATIVE  NEGATIVE mg/dL Final   Hgb urine dipstick 01/12/2022 MODERATE (A)  NEGATIVE Final   Bilirubin Urine 01/12/2022 NEGATIVE  NEGATIVE Final   Ketones, ur 01/12/2022 5 (A)  NEGATIVE mg/dL Final   Protein, ur 01/12/2022 NEGATIVE  NEGATIVE mg/dL Final   Nitrite 01/12/2022 NEGATIVE  NEGATIVE Final   Leukocytes,Ua 01/12/2022 NEGATIVE  NEGATIVE Final   RBC / HPF 01/12/2022 0-5  0 - 5 RBC/hpf Final   WBC, UA 01/12/2022 0-5  0 - 5 WBC/hpf Final   Bacteria, UA 01/12/2022 NONE SEEN  NONE SEEN Final   Performed at [name], [place] [address], [place], [place] [address]   Magnesium 01/12/2022 1.5 (L)  1.7 - 2.4 mg/dL Final   Performed at [facility] [address] [place], [place] [address]   Prothrombin Time 01/12/2022 12.6  11.4 - 15.2 seconds Final   INR 01/12/2022 1.0  0.8 - 1.2 Final   Comment: (NOTE) INR goal varies based on  device and disease states. Performed at [name], [place] [address] [place], [place] [address]     Allergies: Chlorhexidine  Medications:  Facility Ordered Medications  Medication   thiamine (VITAMIN B1) injection 100 mg   [START ON 07/02/2022] thiamine (VITAMIN B1) tablet 100 mg   multivitamin with minerals tablet 1 tablet   LORazepam (ATIVAN) tablet 1 mg   hydrOXYzine (ATARAX) tablet 25 mg   loperamide (IMODIUM) capsule 2-4 mg   ondansetron (ZOFRAN-ODT) disintegrating tablet 4 mg   LORazepam (ATIVAN) tablet 1 mg   Followed by   Derrill Memo ON 07/03/2022] LORazepam (ATIVAN) tablet 1 mg   Followed by   Derrill Memo ON 07/04/2022] LORazepam (ATIVAN) tablet 1 mg   Followed by   Derrill Memo ON 07/05/2022] LORazepam (ATIVAN) tablet 1 mg   acetaminophen (TYLENOL) tablet 650 mg   alum & mag hydroxide-simeth (MAALOX/MYLANTA) 200-200-20 MG/5ML suspension 30 mL   magnesium hydroxide (MILK OF MAGNESIA) suspension 30 mL   traZODone (DESYREL) tablet 50 mg   gabapentin (NEURONTIN) tablet 300 mg   PTA Medications  Medication Sig   loperamide (IMODIUM A-D) 2 MG tablet Take 2 mg by mouth See admin instructions. Take one tablet (2 mg) by mouth every morning, may take one tablet (2 mg) in the afternoon as needed for diarrhea   acetaminophen (TYLENOL) 500 MG tablet Take 1,000 mg by mouth every 6 (six) hours as needed for headache (pain).   ondansetron (ZOFRAN) 4 MG tablet Take 1 tablet (4 mg total) by mouth every 6 (six) hours. (Patient taking differently: Take 4  mg by mouth daily as needed for nausea or vomiting.)   busPIRone (BUSPAR) 7.5 MG tablet Take 7.5 mg by mouth 2 (two) times daily.   hydrOXYzine (ATARAX) 25 MG tablet Take 50 mg by mouth at bedtime.   diclofenac Sodium (VOLTAREN) 1 % GEL Apply 4 g topically 4 (four) times daily as needed (pain).   Multiple Vitamins-Minerals (MULTIVITAMIN ADULTS) TABS Take 1 tablet by mouth daily.   thiamine 100 MG tablet Take 1 tablet (100 mg total) by  mouth daily.   folic acid (FOLVITE) 1 MG tablet Take 1 tablet (1 mg total) by mouth daily.   potassium chloride SA (KLOR-CON M) 20 MEQ tablet Take 1 tablet (20 mEq total) by mouth 2 (two) times daily. (Patient not taking: Reported on 03/04/2022)   disulfiram (ANTABUSE) 250 MG tablet Take 250 mg by mouth daily.   escitalopram (LEXAPRO) 10 MG tablet Take 10 mg by mouth daily.   fluticasone (FLONASE) 50 MCG/ACT nasal spray Place 2 sprays into both nostrils daily as needed for allergies.   gabapentin (NEURONTIN) 300 MG capsule Take 300 mg by mouth daily.   magnesium oxide (MAG-OX) 400 (240 Mg) MG tablet Take 400 mg by mouth 2 (two) times daily.   omeprazole (PRILOSEC) 20 MG capsule Take 20 mg by mouth daily.   traZODone (DESYREL) 100 MG tablet Take 100 mg by mouth at bedtime.   dicyclomine (BENTYL) 20 MG tablet Take 1 tablet (20 mg total) by mouth 2 (two) times daily as needed for up to 20 doses for spasms.   ondansetron (ZOFRAN-ODT) 4 MG disintegrating tablet Take 1 tablet (4 mg total) by mouth every 8 (eight) hours as needed for up to 12 doses for nausea or vomiting.    Long Term Goals: Improvement in symptoms so as ready for discharge  Short Term Goals: Patient will verbalize feelings in meetings with treatment team members., Patient will attend at least of 50% of the groups daily., Pt will complete the PHQ9 on admission, day 3 and discharge., and Patient will take medications as prescribed daily.  Medical Decision Making  Admission to facility based crisis   Lab Orders         Resp panel by RT-PCR (RSV, Flu A&B, Covid) Anterior Nasal Swab         CBC with Differential/Platelet         Comprehensive metabolic panel         Hemoglobin A1c         Ethanol         TSH         Lipid panel         POCT Urine Drug Screen - (I-Screen)         POC SARS Coronavirus 2 Ag     Meds ordered this encounter  Medications   thiamine (VITAMIN B1) injection 100 mg   thiamine (VITAMIN B1) tablet 100 mg    multivitamin with minerals tablet 1 tablet   LORazepam (ATIVAN) tablet 1 mg   hydrOXYzine (ATARAX) tablet 25 mg   loperamide (IMODIUM) capsule 2-4 mg   ondansetron (ZOFRAN-ODT) disintegrating tablet 4 mg   FOLLOWED BY Linked Order Group    LORazepam (ATIVAN) tablet 1 mg    LORazepam (ATIVAN) tablet 1 mg    LORazepam (ATIVAN) tablet 1 mg    LORazepam (ATIVAN) tablet 1 mg   acetaminophen (TYLENOL) tablet 650 mg   alum & mag hydroxide-simeth (MAALOX/MYLANTA) 200-200-20 MG/5ML suspension 30 mL   magnesium hydroxide (  MILK OF MAGNESIA) suspension 30 mL   traZODone (DESYREL) tablet 50 mg   gabapentin (NEURONTIN) tablet 300 mg   Recommendations  Based on my evaluation the patient does not appear to have an emergency medical condition.  Tharon Aquas, NP 07/01/22  7:26 PM

## 2022-07-01 NOTE — ED Notes (Signed)
[facility] contacted for lab pick up.

## 2022-07-01 NOTE — Progress Notes (Signed)
   07/01/22 1646  [place] (Walk-ins at [facility] only)  How Did You Hear About [place]? [facility])  What Is the Reason for Your Visit/Call Today? Patient presents to [facility] seeking help for depression, suicidal ideation and need for alcohol detox.  Patient states that his girlfriend of three years died 06/12/22 and since she died, he states that he has not been able to sleep, eat and states that he has wanted to die to be with her.  He states that she was his bet friend, his drinking buddy and now she is gone. Patient states that he has thought about hurting himself several ways, burn houe, overdose, walk into traffic.  Patient states that he has no prior suicide attempts. Patient denies HI.  Patient states that he hears voices, but states that he cannot make out what they are saying.  Patient states that he lives by himself, but does have support within his family.  Patient denies any history of trauma or abuse.  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  Patient states that he has been drinking a fifth daily.  Patient drank today at 9 am and states that that he drank 1 glass vodka and ginger ale.  Patient states that he is tremulous, hot flashes, cold chills, nausea, headaches, diarrhea. Patient is urgent.  How Long Has This Been Causing You Problems? > than 6 months  Have You Recently Had Any Thoughts About Hurting Yourself? Yes  How long ago did you have thoughts about hurting yourself? essentially the past two weeks  Are You Planning to [place] At This time? No  Have you Recently Had Thoughts About [place]? No  Are You Planning To Harm Someone At This Time? No  Are you currently experiencing any auditory, visual or other hallucinations? Yes  Please explain the hallucinations you are currently experiencing: hears voices that he cannot make out  Have You Used Any Alcohol or Drugs in the Past 24 Hours? Yes  How long ago did you use Drugs  or Alcohol? 9am  What Did You Use and How Much? 1 glass of vodka and ginger ale  Do you have any current medical co-morbidities that require immediate attention? No  Clinician description of patient physical appearance/behavior: casually dressed, cooperative  What Do You Feel Would Help You the Most Today? Alcohol or Drug Use Treatment;Treatment for Depression or other mood problem  If access to [facility] was not available, would you have sought care in the Emergency Department? Yes  Determination of Need Urgent (48 hours)  Options For Referral Facility-Based Crisis;Inpatient Hospitalization

## 2022-07-01 NOTE — ED Notes (Signed)
Gave pt. [place] sandwich and snack and grape juice.

## 2022-07-01 NOTE — ED Notes (Signed)
[facility] nurse informed patient ready to be transferred to [facility].

## 2022-07-02 ENCOUNTER — Encounter (HOSPITAL_COMMUNITY): Payer: Self-pay

## 2022-07-02 DIAGNOSIS — F1010 Alcohol abuse, uncomplicated: Secondary | ICD-10-CM | POA: Diagnosis not present

## 2022-07-02 NOTE — Discharge Instructions (Signed)
[facility] (private insurance) [address] [place], [place] [address] [phone]  ((CD-IOP (afternoon program); individual therapy))  MEDICAL DETOX AND RESIDENTIAL TREATMENT - INSURANCE:  [facility] (also offers CD-IOP for graduates of residential program) [address] [place], [place] [address] [phone]  [facility] [place]: [place], [place] or [phone]; Admissions Progressive [facility] in [place]: Maryanna Shape [phone] in [place], [place]: [phone]  12 STEP PROGRAMS:  Alcoholics Anonymous of [place] ReportZoo.com.cy  Narcotics Anonymous of [place] GreenScrapbooking.dk  Al-Anon of [facility] [facility], [place] www.[place]alanon.org/find-meetings.html  Nar-Anon https://nar-anon.org/find-a-meetin  Patient is instructed prior to discharge to:  Take all medications as prescribed by his/her mental healthcare provider. Report any adverse effects and or reactions from the medicines to his/her outpatient provider promptly. Keep all scheduled appointments, to ensure that you are getting refills on time and to avoid any interruption in your medication.  If you are unable to keep an appointment call to reschedule.  Be sure to follow-up with resources and follow-up appointments provided.  Patient has been instructed & cautioned: To not engage in alcohol and or illegal drug use while on prescription medicines. In the event of worsening symptoms, patient is instructed to call the crisis hotline, 911 and or go to the nearest ED for appropriate evaluation and treatment of symptoms. To follow-up with his/her primary care provider for your other medical issues, concerns and or health care needs.

## 2022-07-02 NOTE — Tx Team (Signed)
LCSW met with patient to assess current mood, affect, physical state, and inquire about needs/goals while here in [facility] and after discharge. Patient reports he presented due to needing to detox from alcohol. Patient reports he lives at home alone with support provided by his family as needed. Patient reports he has  been struggling with alcohol use for about 10-15 years. Patient reports he was drinking a half pint a day, however reports a recent increase to a 5th a day for the past week and a half. Patient reports an increase after losing his significant other on January 2ndPatient denies ever receiving inpatient or outpatient treatment for his alcohol use in the past. Patient denies any prior mental health admissions. Patient reports he is currently working at the [facility] and has been working since November 2023. Patient reports his goal is to detox and then start CDIOP. Patient reports no interest in residential placement or outpatient treatment. Patient reports he believes the intensive outpatient program would be more beneficial for him at this time, as he would like to be seen more frequently than just once every two weeks or month. Patient provided permission for LCSW to follow up with his cousin/sister Ursala for collateral. Patient currently denies any SI/HI/AVH and reports mood as fine. Patient aware that LCSW will send referral for CDIOP and will provide updates as received. Patient expressed understanding and appreciation of LCSW assistance. No other needs were reported at this time by patient.   MD sent referral to Steele Sizer with [facility] outpatient regarding CDIOP appt. Appt to be confirmed on Monday regarding start of services. No other needs to report at this time.   Lucius Conn, LCSW Clinical Social Worker [facility] Ph: [phone]

## 2022-07-02 NOTE — ED Provider Notes (Signed)
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  The patient presented to the emergency department withdrawing from alcohol and reported suicidal thoughts with a plan to overdose.  This was in the context of his girlfriend dying earlier this month.  He was sent to the [facility] where he was assessed, denies suicidal thoughts given that he is receiving treatment.  He was admitted to the facility based crisis.  He was experiencing moderate alcohol withdrawal and was put on an Ativan taper with gabapentin.  The patient has expressed a desire to follow-up outpatient once he is done with detox.  Plan is for CD IOP evaluation on Tuesday at 10 AM.  Will need to confirm with Mikel Cella.  He is able to return to his home.  He usually works during the day but is trying to rearrange his hours so that he can attend groups.  Nursing expressed their concern with caring for a blind patient who is withdrawing from alcohol and unsteady on his feet.  Considered possible discharge.  Unfortunately, the patient does not have adequate family support to facilitate a safe discharge.  However, his cousin, [name], [phone], clearly he cares a lot about him and checks in on him.  Patient placed on one-to-one, to be reassessed each day.  The patient denies auditory/visual hallucinations and first rank symptoms.  He reports good mood, appetite, and sleep. He denies suicidal and homicidal thoughts. He denies side effects from his medications.  Review of systems as below.   Diagnosis:  Final diagnoses:  Alcohol abuse  Bereavement    Total Time spent with  patient: 20 minutes  Past Psychiatric History: as above Past Medical History: per H and P Family History: none Family Psychiatric  History: none Social History: per H and P  Additional Social History:   Per H and P                      Sleep: Fair  Appetite:  Fair   Current Medications:  Current Facility-Administered Medications  Medication Dose Route Frequency Provider Last Rate Last Admin   acetaminophen (TYLENOL) tablet 650 mg  650 mg Oral Q6H PRN Tharon Aquas, NP   650 mg at 07/02/22 1245   alum & mag hydroxide-simeth (MAALOX/MYLANTA) 200-200-20 MG/5ML suspension 30 mL  30 mL Oral Q4H PRN Tharon Aquas, NP       gabapentin (NEURONTIN) tablet 300 mg  300 mg Oral TID Tharon Aquas, NP   300 mg at 07/02/22 0936   hydrOXYzine (ATARAX) tablet 25 mg  25 mg Oral Q6H PRN Tharon Aquas, NP   25 mg at 07/02/22 1245   loperamide (IMODIUM) capsule 2-4 mg  2-4 mg Oral PRN Tharon Aquas, NP       LORazepam (ATIVAN) tablet 1 mg  1 mg Oral Q6H PRN Tharon Aquas, NP       LORazepam (ATIVAN) tablet 1 mg  1 mg Oral QID Tharon Aquas, NP   1 mg at 07/02/22 0936   Followed by   Derrill Memo ON 07/03/2022] LORazepam (ATIVAN) tablet 1 mg  1 mg Oral TID  Tharon Aquas, NP       Followed by   Derrill Memo ON 07/04/2022] LORazepam (ATIVAN) tablet 1 mg  1 mg Oral BID Tharon Aquas, NP       Followed by   Derrill Memo ON 07/05/2022] LORazepam (ATIVAN) tablet 1 mg  1 mg Oral Daily Tharon Aquas, NP       magnesium hydroxide (MILK OF MAGNESIA) suspension 30 mL  30 mL Oral Daily PRN Tharon Aquas, NP       multivitamin with minerals tablet 1 tablet  1 tablet Oral Daily Tharon Aquas, NP   1 tablet at 07/02/22 0936   ondansetron (ZOFRAN-ODT) disintegrating tablet 4 mg  4 mg Oral Q6H PRN Tharon Aquas, NP       thiamine (VITAMIN B1) tablet 100 mg  100 mg Oral Daily Tharon Aquas, NP   100 mg at 07/02/22 9935   traZODone (DESYREL) tablet  50 mg  50 mg Oral QHS PRN Tharon Aquas, NP       Current Outpatient Medications  Medication Sig Dispense Refill   acetaminophen (TYLENOL) 500 MG tablet Take 1,000 mg by mouth every 6 (six) hours as needed for headache (pain).     dicyclomine (BENTYL) 20 MG tablet Take 1 tablet (20 mg total) by mouth 2 (two) times daily as needed for up to 20 doses for spasms. 20 tablet 0   LEXAPRO 5 MG tablet Take 5 mg by mouth every morning.     loperamide (IMODIUM) 2 MG capsule Take 2 mg by mouth 4 (four) times daily as needed for diarrhea or loose stools.     mirtazapine (REMERON) 7.5 MG tablet Take 7.5 mg by mouth at bedtime.     ondansetron (ZOFRAN) 8 MG tablet Take 8 mg by mouth 3 (three) times daily as needed for nausea or vomiting.     potassium chloride (KLOR-CON) 10 MEQ tablet Take 10 mEq by mouth 2 (two) times daily.      Labs  Lab Results:  Admission on 07/01/2022  Component Date Value Ref Range Status   SARS Coronavirus 2 by RT PCR 07/01/2022 NEGATIVE  NEGATIVE Final   Comment: (NOTE) SARS-CoV-2 target nucleic acids are NOT DETECTED.  The SARS-CoV-2 RNA is generally detectable in upper respiratory specimens during the acute phase of infection. The lowest concentration of SARS-CoV-2 viral copies this assay can detect is 138 copies/mL. A negative result does not preclude SARS-Cov-2 infection and should not be used as the sole basis for treatment or other patient management decisions. A negative result may occur with  improper specimen collection/handling, submission of specimen other than nasopharyngeal swab, presence of viral mutation(s) within the areas targeted by this assay, and inadequate number of viral copies(<138 copies/mL). A negative result must be combined with clinical observations, patient history, and epidemiological information. The expected result is Negative.  Fact Sheet for Patients:  EntrepreneurPulse.com.au  Fact Sheet for Healthcare  Providers:  IncredibleEmployment.be  This test is no                          t yet approved or cleared by the [place] FDA and  has been authorized for detection and/or diagnosis of SARS-CoV-2 by FDA under an Emergency Use Authorization (EUA). This EUA will remain  in effect (meaning this test can be used) for the duration of the COVID-19 declaration under Section 564(b)(1) of the Act, 21 U.S.C.section 360bbb-3(b)(1), unless the authorization is  terminated  or revoked sooner.       Influenza A by PCR 07/01/2022 NEGATIVE  NEGATIVE Final   Influenza B by PCR 07/01/2022 NEGATIVE  NEGATIVE Final   Comment: (NOTE) The Xpert Xpress SARS-CoV-2/FLU/RSV plus assay is intended as an aid in the diagnosis of influenza from Nasopharyngeal swab specimens and should not be used as a sole basis for treatment. Nasal washings and aspirates are unacceptable for Xpert Xpress SARS-CoV-2/FLU/RSV testing.  Fact Sheet for Patients: EntrepreneurPulse.com.au  Fact Sheet for Healthcare Providers: IncredibleEmployment.be  This test is not yet approved or cleared by the [place] FDA and has been authorized for detection and/or diagnosis of SARS-CoV-2 by FDA under an Emergency Use Authorization (EUA). This EUA will remain in effect (meaning this test can be used) for the duration of the COVID-19 declaration under Section 564(b)(1) of the Act, 21 U.S.C. section 360bbb-3(b)(1), unless the authorization is terminated or revoked.     Resp Syncytial Virus by PCR 07/01/2022 NEGATIVE  NEGATIVE Final   Comment: (NOTE) Fact Sheet for Patients: EntrepreneurPulse.com.au  Fact Sheet for Healthcare Providers: IncredibleEmployment.be  This test is not yet approved or cleared by the [place] FDA and has been authorized for detection and/or diagnosis of SARS-CoV-2 by FDA under an Emergency Use Authorization  (EUA). This EUA will remain in effect (meaning this test can be used) for the duration of the COVID-19 declaration under Section 564(b)(1) of the Act, 21 U.S.C. section 360bbb-3(b)(1), unless the authorization is terminated or revoked.  Performed at [facility], [place] [address], [place], [place] [address]    WBC 07/01/2022 3.2 (L)  4.0 - 10.5 K/uL Final   RBC 07/01/2022 4.32  4.22 - 5.81 MIL/uL Final   Hemoglobin 07/01/2022 14.1  13.0 - 17.0 g/dL Final   HCT 07/01/2022 39.6  39.0 - 52.0 % Final   MCV 07/01/2022 91.7  80.0 - 100.0 fL Final   MCH 07/01/2022 32.6  26.0 - 34.0 pg Final   MCHC 07/01/2022 35.6  30.0 - 36.0 g/dL Final   RDW 07/01/2022 12.8  11.5 - 15.5 % Final   Platelets 07/01/2022 103 (L)  150 - 400 K/uL Final   REPEATED TO VERIFY   nRBC 07/01/2022 0.0  0.0 - 0.2 % Final   Neutrophils Relative % 07/01/2022 38  % Final   Neutro Abs 07/01/2022 1.2 (L)  1.7 - 7.7 K/uL Final   Lymphocytes Relative 07/01/2022 52  % Final   Lymphs Abs 07/01/2022 1.7  0.7 - 4.0 K/uL Final   Monocytes Relative 07/01/2022 9  % Final   Monocytes Absolute 07/01/2022 0.3  0.1 - 1.0 K/uL Final   Eosinophils Relative 07/01/2022 0  % Final   Eosinophils Absolute 07/01/2022 0.0  0.0 - 0.5 K/uL Final   Basophils Relative 07/01/2022 1  % Final   Basophils Absolute 07/01/2022 0.0  0.0 - 0.1 K/uL Final   Immature Granulocytes 07/01/2022 0  % Final   Abs Immature Granulocytes 07/01/2022 0.00  0.00 - 0.07 K/uL Final   Performed at [facility], [place] [address], [place], [place] [address]   Sodium 07/01/2022 137  135 - 145 mmol/L Final   Potassium 07/01/2022 3.8  3.5 - 5.1 mmol/L Final   Chloride 07/01/2022 99  98 - 111 mmol/L Final   CO2 07/01/2022 30  22 - 32 mmol/L Final   Glucose, Bld 07/01/2022 113 (H)  70 - 99 mg/dL Final   Glucose reference range applies only to samples taken  after fasting for at least 8 hours.   BUN 07/01/2022 5 (L)  6 - 20 mg/dL Final   Creatinine, Ser 07/01/2022 1.06   0.61 - 1.24 mg/dL Final   Calcium 07/01/2022 9.2  8.9 - 10.3 mg/dL Final   Total Protein 07/01/2022 6.8  6.5 - 8.1 g/dL Final   Albumin 07/01/2022 4.0  3.5 - 5.0 g/dL Final   AST 07/01/2022 138 (H)  15 - 41 U/L Final   ALT 07/01/2022 143 (H)  0 - 44 U/L Final   Alkaline Phosphatase 07/01/2022 129 (H)  38 - 126 U/L Final   Total Bilirubin 07/01/2022 1.0  0.3 - 1.2 mg/dL Final   GFR, Estimated 07/01/2022 >60  >60 mL/min Final   Comment: (NOTE) Calculated using the CKD-EPI Creatinine Equation (2021)    Anion gap 07/01/2022 8  5 - 15 Final   Performed at [place] [address] [place], [place] [address]   Hgb A1c MFr Bld 07/01/2022 5.8 (H)  4.8 - 5.6 % Final   Comment: (NOTE) Pre diabetes:          5.7%-6.4%  Diabetes:              >6.4%  Glycemic control for   <7.0% adults with diabetes    Mean Plasma Glucose 07/01/2022 119.76  mg/dL Final   Performed at [facility], [place] [address], [place], [place] [address]   Alcohol, Ethyl (B) 07/01/2022 <10  <10 mg/dL Final   Comment: (NOTE) Lowest detectable limit for serum alcohol is 10 mg/dL.  For medical purposes only. Performed at [facility], [place] [address], [place], [place] [address]    TSH 07/01/2022 2.439  0.350 - 4.500 uIU/mL Final   Comment: Performed by a 3rd Generation assay with a functional sensitivity of <=0.01 uIU/mL. Performed at [facility], [place] [address] [place], [place] [address]    POC Amphetamine UR 07/01/2022 None Detected  NONE DETECTED (Cut Off Level 1000 ng/mL) Final   POC Secobarbital (BAR) 07/01/2022 None Detected  NONE DETECTED (Cut Off Level 300 ng/mL) Final   POC Buprenorphine (BUP) 07/01/2022 None Detected  NONE DETECTED (Cut Off Level 10 ng/mL) Final   POC Oxazepam (BZO) 07/01/2022 Positive (A)  NONE DETECTED (Cut Off Level 300 ng/mL) Final   POC Cocaine UR 07/01/2022 None Detected  NONE DETECTED (Cut Off Level 300 ng/mL) Final   POC Methamphetamine UR 07/01/2022 None  Detected  NONE DETECTED (Cut Off Level 1000 ng/mL) Final   POC Morphine 07/01/2022 None Detected  NONE DETECTED (Cut Off Level 300 ng/mL) Final   POC Methadone UR 07/01/2022 None Detected  NONE DETECTED (Cut Off Level 300 ng/mL) Final   POC Oxycodone UR 07/01/2022 None Detected  NONE DETECTED (Cut Off Level 100 ng/mL) Final   POC Marijuana UR 07/01/2022 None Detected  NONE DETECTED (Cut Off Level 50 ng/mL) Final   SARSCOV2ONAVIRUS 2 AG 07/01/2022 NEGATIVE  NEGATIVE Final   Comment: (NOTE) SARS-CoV-2 antigen NOT DETECTED.   Negative results are presumptive.  Negative results do not preclude SARS-CoV-2 infection and should not be used as the sole basis for treatment or other patient management decisions, including infection  control decisions, particularly in the presence of clinical signs and  symptoms consistent with COVID-19, or in those who have been in contact with the virus.  Negative results must be combined with clinical observations, patient history, and epidemiological information. The expected result is Negative.  Fact Sheet for  Patients: HandmadeRecipes.com.cy  Fact Sheet for Healthcare Providers: FuneralLife.at  This test is not yet approved or cleared by the [place] FDA and  has been authorized for detection and/or diagnosis of SARS-CoV-2 by FDA under an Emergency Use Authorization (EUA).  This EUA will remain in effect (meaning this test can be used) for the duration of  the COV                          ID-19 declaration under Section 564(b)(1) of the Act, 21 U.S.C. section 360bbb-3(b)(1), unless the authorization is terminated or revoked sooner.     Cholesterol 07/01/2022 182  0 - 200 mg/dL Final   Triglycerides 07/01/2022 67  <150 mg/dL Final   HDL 07/01/2022 75  >40 mg/dL Final   Total CHOL/HDL Ratio 07/01/2022 2.4  RATIO Final   VLDL 07/01/2022 13  0 - 40 mg/dL Final   LDL Cholesterol 07/01/2022 94  0 - 99  mg/dL Final   Comment:        Total Cholesterol/HDL:CHD Risk Coronary Heart Disease Risk Table                     Men   Women  1/2 Average Risk   3.4   3.3  Average Risk       5.0   4.4  2 X Average Risk   9.6   7.1  3 X Average Risk  23.4   11.0        Use the calculated Patient Ratio above and the CHD Risk Table to determine the patient's CHD Risk.        ATP III CLASSIFICATION (LDL):  <100     mg/dL   Optimal  100-129  mg/dL   Near or Above                    Optimal  130-159  mg/dL   Borderline  160-189  mg/dL   High  >190     mg/dL   Very High Performed at [place] [address], [place], [place] [address]   Admission on 06/29/2022, Discharged on 06/30/2022  Component Date Value Ref Range Status   Sodium 06/29/2022 138  135 - 145 mmol/L Final   Potassium 06/29/2022 3.9  3.5 - 5.1 mmol/L Final   Chloride 06/29/2022 99  98 - 111 mmol/L Final   CO2 06/29/2022 28  22 - 32 mmol/L Final   Glucose, Bld 06/29/2022 81  70 - 99 mg/dL Final   Glucose reference range applies only to samples taken after fasting for at least 8 hours.   BUN 06/29/2022 9  6 - 20 mg/dL Final   Creatinine, Ser 06/29/2022 0.99  0.61 - 1.24 mg/dL Final   Calcium 06/29/2022 8.5 (L)  8.9 - 10.3 mg/dL Final   Total Protein 06/29/2022 6.7  6.5 - 8.1 g/dL Final   Albumin 06/29/2022 3.8  3.5 - 5.0 g/dL Final   AST 06/29/2022 207 (H)  15 - 41 U/L Final   ALT 06/29/2022 191 (H)  0 - 44 U/L Final   Alkaline Phosphatase 06/29/2022 138 (H)  38 - 126 U/L Final   Total Bilirubin 06/29/2022 0.4  0.3 - 1.2 mg/dL Final   GFR, Estimated 06/29/2022 >60  >60 mL/min Final   Comment: (NOTE) Calculated using the CKD-EPI Creatinine Equation (2021)    Anion gap 06/29/2022 11  5 - 15 Final  Performed at [facility], [place] [address], [place], [place] [address]   Alcohol, Ethyl (B) 06/29/2022 251 (H)  <10 mg/dL Final   Comment: (NOTE) Lowest detectable limit for serum alcohol is 10 mg/dL.  For medical  purposes only. Performed at [facility], [place] [address], [place], [place] [address]    WBC 06/29/2022 3.2 (L)  4.0 - 10.5 K/uL Final   RBC 06/29/2022 4.33  4.22 - 5.81 MIL/uL Final   Hemoglobin 06/29/2022 14.2  13.0 - 17.0 g/dL Final   HCT 06/29/2022 39.4  39.0 - 52.0 % Final   MCV 06/29/2022 91.0  80.0 - 100.0 fL Final   MCH 06/29/2022 32.8  26.0 - 34.0 pg Final   MCHC 06/29/2022 36.0  30.0 - 36.0 g/dL Final   RDW 06/29/2022 13.2  11.5 - 15.5 % Final   Platelets 06/29/2022 125 (L)  150 - 400 K/uL Final   nRBC 06/29/2022 0.0  0.0 - 0.2 % Final   Neutrophils Relative % 06/29/2022 44  % Final   Neutro Abs 06/29/2022 1.4 (L)  1.7 - 7.7 K/uL Final   Lymphocytes Relative 06/29/2022 51  % Final   Lymphs Abs 06/29/2022 1.6  0.7 - 4.0 K/uL Final   Monocytes Relative 06/29/2022 4  % Final   Monocytes Absolute 06/29/2022 0.1  0.1 - 1.0 K/uL Final   Eosinophils Relative 06/29/2022 1  % Final   Eosinophils Absolute 06/29/2022 0.0  0.0 - 0.5 K/uL Final   Basophils Relative 06/29/2022 0  % Final   Basophils Absolute 06/29/2022 0.0  0.0 - 0.1 K/uL Final   WBC Morphology 06/29/2022 See Note   Final   ALLN   nRBC 06/29/2022 0  0 /100 WBC Final   Abs Immature Granulocytes 06/29/2022 0.00  0.00 - 0.07 K/uL Final   Performed at [facility], [place] [address] [place], [place] [address]   Acetaminophen (Tylenol), Serum 06/29/2022 <10 (L)  10 - 30 ug/mL Final   Comment: (NOTE) Therapeutic concentrations vary significantly. A range of 10-30 ug/mL  may be an effective concentration for many patients. However, some  are best treated at concentrations outside of this range. Acetaminophen concentrations >150 ug/mL at 4 hours after ingestion  and >50 ug/mL at 12 hours after ingestion are often associated with  toxic reactions.  Performed at [facility], [place] [address] [place], [place] [address]    Salicylate Lvl 46/65/9935 <7.0 (L)  7.0 - 30.0 mg/dL Final   Performed at [facility] [address], [place], [place] [address]   Troponin I (High Sensitivity) 06/29/2022 3  <18 ng/L Final   Comment: (NOTE) Elevated high sensitivity troponin I (hsTnI) values and significant  changes across serial measurements may suggest ACS but many other  chronic and acute conditions are known to elevate hsTnI results.  Refer to the "Links" section for chest pain algorithms and additional  guidance. Performed at [facility], [place] [address], [place], [place] [address]    Troponin I (High Sensitivity) 06/29/2022 3  <18 ng/L Final   Comment: (NOTE) Elevated high sensitivity troponin I (hsTnI) values and significant  changes across serial measurements may suggest ACS but many other  chronic and acute conditions are known to elevate hsTnI results.  Refer to the "Links" section for chest pain algorithms and additional  guidance. Performed at [facility], [place] [address] [place],  [address]   Admission on 05/18/2022, Discharged  on 05/18/2022  Component Date Value Ref Range Status   Sodium 05/18/2022 134 (L)  135 - 145 mmol/L Final   Potassium 05/18/2022 3.3 (L)  3.5 - 5.1 mmol/L Final   Chloride 05/18/2022 97 (L)  98 - 111 mmol/L Final   CO2 05/18/2022 26  22 - 32 mmol/L Final   Glucose, Bld 05/18/2022 127 (H)  70 - 99 mg/dL Final   Glucose reference range applies only to samples taken after fasting for at least 8 hours.   BUN 05/18/2022 <5 (L)  6 - 20 mg/dL Final   Creatinine, Ser 05/18/2022 0.91  0.61 - 1.24 mg/dL Final   Calcium 05/18/2022 8.8 (L)  8.9 - 10.3 mg/dL Final   Total Protein 05/18/2022 6.7  6.5 - 8.1 g/dL Final   Albumin 05/18/2022 3.8  3.5 - 5.0 g/dL Final   AST 05/18/2022 637 (H)  15 - 41 U/L Final   ALT 05/18/2022 255 (H)  0 - 44 U/L Final   Alkaline Phosphatase 05/18/2022 102  38 - 126 U/L Final   Total Bilirubin 05/18/2022 0.7  0.3 - 1.2 mg/dL Final   GFR, Estimated 05/18/2022 >60  >60 mL/min Final   Comment: (NOTE) Calculated using  the CKD-EPI Creatinine Equation (2021)    Anion gap 05/18/2022 11  5 - 15 Final   Performed at [place] [address] [place], [place] [address]   Opiates 05/18/2022 NONE DETECTED  NONE DETECTED Final   Cocaine 05/18/2022 NONE DETECTED  NONE DETECTED Final   Benzodiazepines 05/18/2022 NONE DETECTED  NONE DETECTED Final   Amphetamines 05/18/2022 NONE DETECTED  NONE DETECTED Final   Tetrahydrocannabinol 05/18/2022 POSITIVE (A)  NONE DETECTED Final   Barbiturates 05/18/2022 NONE DETECTED  NONE DETECTED Final   Comment: (NOTE) DRUG SCREEN FOR MEDICAL PURPOSES ONLY.  IF CONFIRMATION IS NEEDED FOR ANY PURPOSE, NOTIFY LAB WITHIN 5 DAYS.  LOWEST DETECTABLE LIMITS FOR URINE DRUG SCREEN Drug Class                     Cutoff (ng/mL) Amphetamine and metabolites    1000 Barbiturate and metabolites    200 Benzodiazepine                 200 Opiates and metabolites        300 Cocaine and metabolites        300 THC                            50 Performed at [facility], [place] [address] [place], [place] [address]    WBC 05/18/2022 1.7 (L)  4.0 - 10.5 K/uL Final   RBC 05/18/2022 4.81  4.22 - 5.81 MIL/uL Final   Hemoglobin 05/18/2022 15.8  13.0 - 17.0 g/dL Final   HCT 05/18/2022 42.8  39.0 - 52.0 % Final   MCV 05/18/2022 89.0  80.0 - 100.0 fL Final   MCH 05/18/2022 32.8  26.0 - 34.0 pg Final   MCHC 05/18/2022 36.9 (H)  30.0 - 36.0 g/dL Final   RDW 05/18/2022 12.5  11.5 - 15.5 % Final   Platelets 05/18/2022 112 (L)  150 - 400 K/uL Final   nRBC 05/18/2022 0.0  0.0 - 0.2 % Final   Neutrophils Relative % 05/18/2022 26  % Final   Neutro Abs 05/18/2022 0.4 (LL)  1.7 - 7.7 K/uL Final   Comment: REPEATED TO VERIFY THIS CRITICAL RESULT HAS  VERIFIED AND BEEN CALLED TO BRITTANY BECK,RN BY ZELDA BEECH ON 12 12 2023 AT 1410, AND HAS BEEN READ BACK.     Lymphocytes Relative 05/18/2022 59  % Final   Lymphs Abs 05/18/2022 1.0  0.7 - 4.0 K/uL Final   Monocytes Relative 05/18/2022 13  %  Final   Monocytes Absolute 05/18/2022 0.2  0.1 - 1.0 K/uL Final   Eosinophils Relative 05/18/2022 1  % Final   Eosinophils Absolute 05/18/2022 0.0  0.0 - 0.5 K/uL Final   Basophils Relative 05/18/2022 1  % Final   Basophils Absolute 05/18/2022 0.0  0.0 - 0.1 K/uL Final   WBC Morphology 05/18/2022 MORPHOLOGY UNREMARKABLE   Final   RBC Morphology 05/18/2022 MORPHOLOGY UNREMARKABLE   Final   Smear Review 05/18/2022 MORPHOLOGY UNREMARKABLE   Final   Immature Granulocytes 05/18/2022 0  % Final   Abs Immature Granulocytes 05/18/2022 0.00  0.00 - 0.07 K/uL Final   Performed at [facility], [place] [address] [place], [place] [address]   Alcohol, Ethyl (B) 05/18/2022 105 (H)  <10 mg/dL Final   Comment: (NOTE) Lowest detectable limit for serum alcohol is 10 mg/dL.  For medical purposes only. Performed at [facility], [place] [address] [place], [place] [address]   Admission on 05/08/2022, Discharged on 05/08/2022  Component Date Value Ref Range Status   Total hemoglobin 05/08/2022 13.5  12.0 - 16.0 g/dL Final   O2 Saturation 05/08/2022 56.7  % Final   Carboxyhemoglobin 05/08/2022 4.1 (H)  0.5 - 1.5 % Final   Methemoglobin 05/08/2022 0.7  0.0 - 1.5 % Final   Performed at [facility], [place] [address] [place], [place] [address]   WBC 05/08/2022 4.2  4.0 - 10.5 K/uL Final   RBC 05/08/2022 3.89 (L)  4.22 - 5.81 MIL/uL Final   Hemoglobin 05/08/2022 13.0  13.0 - 17.0 g/dL Final   HCT 05/08/2022 36.9 (L)  39.0 - 52.0 % Final   MCV 05/08/2022 94.9  80.0 - 100.0 fL Final   MCH 05/08/2022 33.4  26.0 - 34.0 pg Final   MCHC 05/08/2022 35.2  30.0 - 36.0 g/dL Final   RDW 05/08/2022 13.8  11.5 - 15.5 % Final   Platelets 05/08/2022 89 (L)  150 - 400 K/uL Final   Comment: Immature Platelet Fraction may be clinically indicated, consider ordering this additional test [id number] REPEATED TO VERIFY PLATELET COUNT CONFIRMED BY SMEAR    nRBC 05/08/2022 0.0  0.0 - 0.2 % Final   Performed at  [facility], [place] [address], [place], [place] [address]   Sodium 05/08/2022 140  135 - 145 mmol/L Final   Potassium 05/08/2022 3.8  3.5 - 5.1 mmol/L Final   Chloride 05/08/2022 103  98 - 111 mmol/L Final   CO2 05/08/2022 25  22 - 32 mmol/L Final   Glucose, Bld 05/08/2022 92  70 - 99 mg/dL Final   Glucose reference range applies only to samples taken after fasting for at least 8 hours.   BUN 05/08/2022 12  6 - 20 mg/dL Final   Creatinine, Ser 05/08/2022 1.00  0.61 - 1.24 mg/dL Final   Calcium 05/08/2022 8.9  8.9 - 10.3 mg/dL Final   Total Protein 05/08/2022 6.1 (L)  6.5 - 8.1 g/dL Final   Albumin 05/08/2022 3.7  3.5 - 5.0 g/dL Final   AST 05/08/2022 45 (H)  15 - 41 U/L Final   ALT 05/08/2022 35  0 - 44 U/L Final  Alkaline Phosphatase 05/08/2022 75  38 - 126 U/L Final   Total Bilirubin 05/08/2022 0.7  0.3 - 1.2 mg/dL Final   GFR, Estimated 05/08/2022 >60  >60 mL/min Final   Comment: (NOTE) Calculated using the CKD-EPI Creatinine Equation (2021)    Anion gap 05/08/2022 12  5 - 15 Final   Performed at [facility] [address] [place], [place] [address]   SARS Coronavirus 2 by RT PCR 05/08/2022 NEGATIVE  NEGATIVE Final   Comment: (NOTE) SARS-CoV-2 target nucleic acids are NOT DETECTED.  The SARS-CoV-2 RNA is generally detectable in upper respiratory specimens during the acute phase of infection. The lowest concentration of SARS-CoV-2 viral copies this assay can detect is 138 copies/mL. A negative result does not preclude SARS-Cov-2 infection and should not be used as the sole basis for treatment or other patient management decisions. A negative result may occur with  improper specimen collection/handling, submission of specimen other than nasopharyngeal swab, presence of viral mutation(s) within the areas targeted by this assay, and inadequate number of viral copies(<138 copies/mL). A negative result must be combined with clinical observations, patient history, and  epidemiological information. The expected result is Negative.  Fact Sheet for Patients:  EntrepreneurPulse.com.au  Fact Sheet for Healthcare Providers:  IncredibleEmployment.be  This test is no                          t yet approved or cleared by the [place] FDA and  has been authorized for detection and/or diagnosis of SARS-CoV-2 by FDA under an Emergency Use Authorization (EUA). This EUA will remain  in effect (meaning this test can be used) for the duration of the COVID-19 declaration under Section 564(b)(1) of the Act, 21 U.S.C.section 360bbb-3(b)(1), unless the authorization is terminated  or revoked sooner.       Influenza A by PCR 05/08/2022 NEGATIVE  NEGATIVE Final   Influenza B by PCR 05/08/2022 NEGATIVE  NEGATIVE Final   Comment: (NOTE) The Xpert Xpress SARS-CoV-2/FLU/RSV plus assay is intended as an aid in the diagnosis of influenza from Nasopharyngeal swab specimens and should not be used as a sole basis for treatment. Nasal washings and aspirates are unacceptable for Xpert Xpress SARS-CoV-2/FLU/RSV testing.  Fact Sheet for Patients: EntrepreneurPulse.com.au  Fact Sheet for Healthcare Providers: IncredibleEmployment.be  This test is not yet approved or cleared by the [place] FDA and has been authorized for detection and/or diagnosis of SARS-CoV-2 by FDA under an Emergency Use Authorization (EUA). This EUA will remain in effect (meaning this test can be used) for the duration of the COVID-19 declaration under Section 564(b)(1) of the Act, 21 U.S.C. section 360bbb-3(b)(1), unless the authorization is terminated or revoked.  Performed at [facility], [place] [address] [place], [place] [address]   Admission on 03/11/2022, Discharged on 03/12/2022  Component Date Value Ref Range Status   Sodium 03/11/2022 132 (L)  135 - 145 mmol/L Final   Potassium 03/11/2022 3.6  3.5 - 5.1  mmol/L Final   Chloride 03/11/2022 95 (L)  98 - 111 mmol/L Final   CO2 03/11/2022 24  22 - 32 mmol/L Final   Glucose, Bld 03/11/2022 119 (H)  70 - 99 mg/dL Final   Glucose reference range applies only to samples taken after fasting for at least 8 hours.   BUN 03/11/2022 6  6 - 20 mg/dL Final   Creatinine, Ser 03/11/2022 1.12  0.61 - 1.24 mg/dL Final  Calcium 03/11/2022 9.1  8.9 - 10.3 mg/dL Final   Total Protein 03/11/2022 6.9  6.5 - 8.1 g/dL Final   Albumin 03/11/2022 4.3  3.5 - 5.0 g/dL Final   AST 03/11/2022 72 (H)  15 - 41 U/L Final   ALT 03/11/2022 41  0 - 44 U/L Final   Alkaline Phosphatase 03/11/2022 102  38 - 126 U/L Final   Total Bilirubin 03/11/2022 0.8  0.3 - 1.2 mg/dL Final   GFR, Estimated 03/11/2022 >60  >60 mL/min Final   Comment: (NOTE) Calculated using the CKD-EPI Creatinine Equation (2021)    Anion gap 03/11/2022 13  5 - 15 Final   Performed at [facility], [place], [place], [place] [address]   WBC 03/11/2022 4.2  4.0 - 10.5 K/uL Final   RBC 03/11/2022 4.40  4.22 - 5.81 MIL/uL Final   Hemoglobin 03/11/2022 14.0  13.0 - 17.0 g/dL Final   HCT 03/11/2022 39.6  39.0 - 52.0 % Final   MCV 03/11/2022 90.0  80.0 - 100.0 fL Final   MCH 03/11/2022 31.8  26.0 - 34.0 pg Final   MCHC 03/11/2022 35.4  30.0 - 36.0 g/dL Final   RDW 03/11/2022 13.4  11.5 - 15.5 % Final   Platelets 03/11/2022 124 (L)  150 - 400 K/uL Final   nRBC 03/11/2022 0.0  0.0 - 0.2 % Final   Neutrophils Relative % 03/11/2022 45  % Final   Neutro Abs 03/11/2022 1.9  1.7 - 7.7 K/uL Final   Lymphocytes Relative 03/11/2022 38  % Final   Lymphs Abs 03/11/2022 1.6  0.7 - 4.0 K/uL Final   Monocytes Relative 03/11/2022 16  % Final   Monocytes Absolute 03/11/2022 0.7  0.1 - 1.0 K/uL Final   Eosinophils Relative 03/11/2022 1  % Final   Eosinophils Absolute 03/11/2022 0.0  0.0 - 0.5 K/uL Final   Basophils Relative 03/11/2022 0  % Final   Basophils Absolute 03/11/2022 0.0  0.0 - 0.1 K/uL  Final   Immature Granulocytes 03/11/2022 0  % Final   Abs Immature Granulocytes 03/11/2022 0.00  0.00 - 0.07 K/uL Final   Performed at [facility], [address] [place], [place] [address]   Lipase 03/11/2022 15  11 - 51 U/L Final   Performed at [facility], [address] [place], [place] [address]   Color, Urine 03/11/2022 YELLOW  YELLOW Final   APPearance 03/11/2022 CLEAR  CLEAR Final   Specific Gravity, Urine 03/11/2022 1.028  1.005 - 1.030 Final   pH 03/11/2022 5.5  5.0 - 8.0 Final   Glucose, UA 03/11/2022 NEGATIVE  NEGATIVE mg/dL Final   Hgb urine dipstick 03/11/2022 LARGE (A)  NEGATIVE Final   Bilirubin Urine 03/11/2022 NEGATIVE  NEGATIVE Final   Ketones, ur 03/11/2022 NEGATIVE  NEGATIVE mg/dL Final   Protein, ur 03/11/2022 TRACE (A)  NEGATIVE mg/dL Final   Nitrite 03/11/2022 NEGATIVE  NEGATIVE Final   Leukocytes,Ua 03/11/2022 NEGATIVE  NEGATIVE Final   RBC / HPF 03/11/2022 0-5  0 - 5 RBC/hpf Final   WBC, UA 03/11/2022 0-5  0 - 5 WBC/hpf Final   Mucus 03/11/2022 PRESENT   Final   Hyaline Casts, UA 03/11/2022 PRESENT   Final   Performed at [facility], [address] [place], [place] [address]   Alcohol, Ethyl (B) 03/11/2022 63 (H)  <10 mg/dL Final   Comment: (NOTE) Lowest detectable limit for serum alcohol is 10 mg/dL.  For medical purposes only. Performed at  [facility], [address], [place], [place] [address]    Salicylate Lvl 28/31/5176 <7.0 (L)  7.0 - 30.0 mg/dL Final   Performed at [facility], [place], [place] [address]   Acetaminophen (Tylenol), Serum 03/11/2022 <10 (L)  10 - 30 ug/mL Final   Comment: (NOTE) Therapeutic concentrations vary significantly. A range of 10-30 ug/mL  may be an effective concentration for many patients. However, some  are best treated at concentrations outside of this range. Acetaminophen concentrations >150 ug/mL  at 4 hours after ingestion  and >50 ug/mL at 12 hours after ingestion are often associated with  toxic reactions.  Performed at [facility], [place], [place] [address]    Troponin I (High Sensitivity) 03/11/2022 <2  <18 ng/L Final   Comment: (NOTE) Elevated high sensitivity troponin I (hsTnI) values and significant  changes across serial measurements may suggest ACS but many other  chronic and acute conditions are known to elevate hsTnI results.  Refer to the "Links" section for chest pain algorithms and additional  guidance. Performed at [facility], [address], [place], [place] [address]    SARS Coronavirus 2 by RT PCR 03/11/2022 NEGATIVE  NEGATIVE Final   Comment: (NOTE) SARS-CoV-2 target nucleic acids are NOT DETECTED.  The SARS-CoV-2 RNA is generally detectable in upper respiratory specimens during the acute phase of infection. The lowest concentration of SARS-CoV-2 viral copies this assay can detect is 138 copies/mL. A negative result does not preclude SARS-Cov-2 infection and should not be used as the sole basis for treatment or other patient management decisions. A negative result may occur with  improper specimen collection/handling, submission of specimen other than nasopharyngeal swab, presence of viral mutation(s) within the areas targeted by this assay, and inadequate number of viral copies(<138 copies/mL). A negative result must be combined with clinical observations, patient history, and epidemiological information. The expected result is Negative.  Fact Sheet for Patients:  EntrepreneurPulse.com.au  Fact Sheet for Healthcare Providers:  IncredibleEmployment.be  This test is no                          t yet approved or cleared by the [place] FDA and  has been authorized for detection and/or diagnosis of SARS-CoV-2 by FDA under an Emergency Use Authorization  (EUA). This EUA will remain  in effect (meaning this test can be used) for the duration of the COVID-19 declaration under Section 564(b)(1) of the Act, 21 U.S.C.section 360bbb-3(b)(1), unless the authorization is terminated  or revoked sooner.       Influenza A by PCR 03/11/2022 NEGATIVE  NEGATIVE Final   Influenza B by PCR 03/11/2022 NEGATIVE  NEGATIVE Final   Comment: (NOTE) The Xpert Xpress SARS-CoV-2/FLU/RSV plus assay is intended as an aid in the diagnosis of influenza from Nasopharyngeal swab specimens and should not be used as a sole basis for treatment. Nasal washings and aspirates are unacceptable for Xpert Xpress SARS-CoV-2/FLU/RSV testing.  Fact Sheet for Patients: EntrepreneurPulse.com.au  Fact Sheet for Healthcare Providers: IncredibleEmployment.be  This test is not yet approved or cleared by the [place] FDA and has been authorized for detection and/or diagnosis of SARS-CoV-2 by FDA under an Emergency Use Authorization (EUA). This EUA will remain in effect (meaning this test can be used) for the duration of the COVID-19 declaration under Section 564(b)(1) of the Act, 21 U.S.C. section 360bbb-3(b)(1), unless the authorization is terminated or revoked.  Performed at [facility], [address] [place], [place] [address]    Opiates 03/11/2022 POSITIVE (A)  NONE DETECTED Final   Cocaine 03/11/2022 NONE DETECTED  NONE DETECTED Final   Benzodiazepines 03/11/2022 NONE DETECTED  NONE DETECTED Final   Amphetamines 03/11/2022 NONE DETECTED  NONE DETECTED Final   Tetrahydrocannabinol 03/11/2022 NONE DETECTED  NONE DETECTED Final   Barbiturates 03/11/2022 NONE DETECTED  NONE DETECTED Final   Comment: (NOTE) DRUG SCREEN FOR MEDICAL PURPOSES ONLY.  IF CONFIRMATION IS NEEDED FOR ANY PURPOSE, NOTIFY LAB WITHIN 5 DAYS.  LOWEST DETECTABLE LIMITS FOR URINE DRUG SCREEN Drug Class                     Cutoff  (ng/mL) Amphetamine and metabolites    1000 Barbiturate and metabolites    200 Benzodiazepine                 952 Tricyclics and metabolites     300 Opiates and metabolites        300 Cocaine and metabolites        300 THC                            50 Performed at [facility], [address] [place], [place] [address]   Admission on 03/10/2022, Discharged on 03/11/2022  Component Date Value Ref Range Status   WBC 03/10/2022 4.3  4.0 - 10.5 K/uL Final   RBC 03/10/2022 4.47  4.22 - 5.81 MIL/uL Final   Hemoglobin 03/10/2022 14.5  13.0 - 17.0 g/dL Final   HCT 03/10/2022 40.0  39.0 - 52.0 % Final   MCV 03/10/2022 89.5  80.0 - 100.0 fL Final   MCH 03/10/2022 32.4  26.0 - 34.0 pg Final   MCHC 03/10/2022 36.3 (H)  30.0 - 36.0 g/dL Final   RDW 03/10/2022 13.7  11.5 - 15.5 % Final   Platelets 03/10/2022 135 (L)  150 - 400 K/uL Final   nRBC 03/10/2022 0.0  0.0 - 0.2 % Final   Neutrophils Relative % 03/10/2022 31  % Final   Neutro Abs 03/10/2022 1.3 (L)  1.7 - 7.7 K/uL Final   Lymphocytes Relative 03/10/2022 52  % Final   Lymphs Abs 03/10/2022 2.2  0.7 - 4.0 K/uL Final   Monocytes Relative 03/10/2022 15  % Final   Monocytes Absolute 03/10/2022 0.7  0.1 - 1.0 K/uL Final   Eosinophils Relative 03/10/2022 1  % Final   Eosinophils Absolute 03/10/2022 0.1  0.0 - 0.5 K/uL Final   Basophils Relative 03/10/2022 1  % Final   Basophils Absolute 03/10/2022 0.0  0.0 - 0.1 K/uL Final   Immature Granulocytes 03/10/2022 0  % Final   Abs Immature Granulocytes 03/10/2022 0.01  0.00 - 0.07 K/uL Final   Performed at [facility], [place] [address] [place], [place] [address]   Sodium 03/10/2022 136  135 - 145 mmol/L Final   Potassium 03/10/2022 3.5  3.5 - 5.1 mmol/L Final   Chloride 03/10/2022 98  98 - 111 mmol/L Final   CO2 03/10/2022 24  22 - 32 mmol/L Final   Glucose, Bld 03/10/2022 115 (H)  70 - 99 mg/dL Final   Glucose reference range applies only to samples taken after  fasting for at least 8 hours.   BUN 03/10/2022 7  6 - 20 mg/dL Final   Creatinine, Ser 03/10/2022 1.12  0.61 - 1.24 mg/dL  Final   Calcium 03/10/2022 8.8 (L)  8.9 - 10.3 mg/dL Final   Total Protein 03/10/2022 7.1  6.5 - 8.1 g/dL Final   Albumin 03/10/2022 3.9  3.5 - 5.0 g/dL Final   AST 03/10/2022 86 (H)  15 - 41 U/L Final   ALT 03/10/2022 48 (H)  0 - 44 U/L Final   Alkaline Phosphatase 03/10/2022 101  38 - 126 U/L Final   Total Bilirubin 03/10/2022 1.0  0.3 - 1.2 mg/dL Final   GFR, Estimated 03/10/2022 >60  >60 mL/min Final   Comment: (NOTE) Calculated using the CKD-EPI Creatinine Equation (2021)    Anion gap 03/10/2022 14  5 - 15 Final   Performed at [facility] [address], [place], [place] [address]   Lipase 03/10/2022 30  11 - 51 U/L Final   Performed at [facility] [address] [place], [place] [address]   Acetaminophen (Tylenol), Serum 03/10/2022 <10 (L)  10 - 30 ug/mL Final   Comment: (NOTE) Therapeutic concentrations vary significantly. A range of 10-30 ug/mL  may be an effective concentration for many patients. However, some  are best treated at concentrations outside of this range. Acetaminophen concentrations >150 ug/mL at 4 hours after ingestion  and >50 ug/mL at 12 hours after ingestion are often associated with  toxic reactions.  Performed at [facility], [place] [address], [place], [place] [address]    Salicylate Lvl 38/46/6599 <7.0 (L)  7.0 - 30.0 mg/dL Final   Performed at [facility] [address] [place], [place] [address]   Alcohol, Ethyl (B) 03/10/2022 227 (H)  <10 mg/dL Final   Comment: (NOTE) Lowest detectable limit for serum alcohol is 10 mg/dL.  For medical purposes only. Performed at [facility], [place] [address] [place], [place] [address]   Admission on 03/03/2022, Discharged on 03/04/2022  Component Date Value Ref Range Status   Sodium 03/03/2022 137  135 - 145 mmol/L Final   Potassium 03/03/2022 4.1  3.5  - 5.1 mmol/L Final   Chloride 03/03/2022 103  98 - 111 mmol/L Final   CO2 03/03/2022 26  22 - 32 mmol/L Final   Glucose, Bld 03/03/2022 99  70 - 99 mg/dL Final   Glucose reference range applies only to samples taken after fasting for at least 8 hours.   BUN 03/03/2022 11  6 - 20 mg/dL Final   Creatinine, Ser 03/03/2022 0.88  0.61 - 1.24 mg/dL Final   Calcium 03/03/2022 8.6 (L)  8.9 - 10.3 mg/dL Final   Total Protein 03/03/2022 7.1  6.5 - 8.1 g/dL Final   Albumin 03/03/2022 3.8  3.5 - 5.0 g/dL Final   AST 03/03/2022 47 (H)  15 - 41 U/L Final   ALT 03/03/2022 36  0 - 44 U/L Final   Alkaline Phosphatase 03/03/2022 81  38 - 126 U/L Final   Total Bilirubin 03/03/2022 0.8  0.3 - 1.2 mg/dL Final   GFR, Estimated 03/03/2022 >60  >60 mL/min Final   Comment: (NOTE) Calculated using the CKD-EPI Creatinine Equation (2021)    Anion gap 03/03/2022 8  5 - 15 Final   Performed at [facility], [place] [address], [place], [place] [address]   Alcohol, Ethyl (B) 03/03/2022 280 (H)  <10 mg/dL Final   Comment: (NOTE) Lowest detectable limit for serum alcohol is 10 mg/dL.  For medical purposes only. Performed at [facility], [place] [address] [place],  [place] [address]    Salicylate Lvl 45/80/9983 <7.0 (L)  7.0 - 30.0 mg/dL Final   Performed at [facility] [address] [place], [place] [address]   Acetaminophen (Tylenol), Serum 03/03/2022 <10 (L)  10 - 30 ug/mL Final   Comment: (NOTE) Therapeutic concentrations vary significantly. A range of 10-30 ug/mL  may be an effective concentration for many patients. However, some  are best treated at concentrations outside of this range. Acetaminophen concentrations >150 ug/mL at 4 hours after ingestion  and >50 ug/mL at 12 hours after ingestion are often associated with  toxic reactions.  Performed at [facility], [place] [address], [place], [place] [address]    WBC 03/03/2022 4.8   4.0 - 10.5 K/uL Final   RBC 03/03/2022 4.39  4.22 - 5.81 MIL/uL Final   Hemoglobin 03/03/2022 14.0  13.0 - 17.0 g/dL Final   HCT 03/03/2022 40.0  39.0 - 52.0 % Final   MCV 03/03/2022 91.1  80.0 - 100.0 fL Final   MCH 03/03/2022 31.9  26.0 - 34.0 pg Final   MCHC 03/03/2022 35.0  30.0 - 36.0 g/dL Final   RDW 03/03/2022 13.2  11.5 - 15.5 % Final   Platelets 03/03/2022 152  150 - 400 K/uL Final   nRBC 03/03/2022 0.0  0.0 - 0.2 % Final   Performed at [facility], [place] [address], [place], [place] [address]   Opiates 03/03/2022 NONE DETECTED  NONE DETECTED Final   Cocaine 03/03/2022 NONE DETECTED  NONE DETECTED Final   Benzodiazepines 03/03/2022 NONE DETECTED  NONE DETECTED Final   Amphetamines 03/03/2022 NONE DETECTED  NONE DETECTED Final   Tetrahydrocannabinol 03/03/2022 NONE DETECTED  NONE DETECTED Final   Barbiturates 03/03/2022 NONE DETECTED  NONE DETECTED Final   Comment: (NOTE) DRUG SCREEN FOR MEDICAL PURPOSES ONLY.  IF CONFIRMATION IS NEEDED FOR ANY PURPOSE, NOTIFY LAB WITHIN 5 DAYS.  LOWEST DETECTABLE LIMITS FOR URINE DRUG SCREEN Drug Class                     Cutoff (ng/mL) Amphetamine and metabolites    1000 Barbiturate and metabolites    200 Benzodiazepine                 379 Tricyclics and metabolites     300 Opiates and metabolites        300 Cocaine and metabolites        300 THC                            50 Performed at [facility], [place] [address] [place], [place] [address]    SARS Coronavirus 2 by RT PCR 03/03/2022 NEGATIVE  NEGATIVE Final   Comment: (NOTE) SARS-CoV-2 target nucleic acids are NOT DETECTED.  The SARS-CoV-2 RNA is generally detectable in upper and lower respiratory specimens during the acute phase of infection. The lowest concentration of SARS-CoV-2 viral copies this assay can detect is 250 copies / mL. A negative result does not preclude SARS-CoV-2 infection and should not be used as the sole basis for  treatment or other patient management decisions.  A negative result may occur with improper specimen collection / handling, submission of specimen other than nasopharyngeal swab, presence of viral mutation(s) within the areas targeted by this assay, and inadequate number of viral copies (<250 copies / mL). A negative result must be combined with clinical observations, patient history,  and epidemiological information.  Fact Sheet for Patients:   https://www.patel.info/  Fact Sheet for Healthcare Providers: https://hall.com/  This test is not yet approved or                           cleared by the [place] FDA and has been authorized for detection and/or diagnosis of SARS-CoV-2 by FDA under an Emergency Use Authorization (EUA).  This EUA will remain in effect (meaning this test can be used) for the duration of the COVID-19 declaration under Section 564(b)(1) of the Act, 21 U.S.C. section 360bbb-3(b)(1), unless the authorization is terminated or revoked sooner.  Performed at [facility], [place] [address] [place], [place] [address]    Lipase 03/03/2022 31  11 - 51 U/L Final   Performed at [facility], [place] [address], [place], [place] [address]  Admission on 01/12/2022, Discharged on 01/12/2022  Component Date Value Ref Range Status   WBC 01/12/2022 3.1 (L)  4.0 - 10.5 K/uL Final   RBC 01/12/2022 4.77  4.22 - 5.81 MIL/uL Final   Hemoglobin 01/12/2022 15.0  13.0 - 17.0 g/dL Final   HCT 01/12/2022 43.3  39.0 - 52.0 % Final   MCV 01/12/2022 90.8  80.0 - 100.0 fL Final   MCH 01/12/2022 31.4  26.0 - 34.0 pg Final   MCHC 01/12/2022 34.6  30.0 - 36.0 g/dL Final   RDW 01/12/2022 12.7  11.5 - 15.5 % Final   Platelets 01/12/2022 107 (L)  150 - 400 K/uL Final   Comment: Immature Platelet Fraction may be clinically indicated, consider ordering this additional test [id number] CONSISTENT WITH PREVIOUS  RESULT REPEATED TO VERIFY    nRBC 01/12/2022 0.0  0.0 - 0.2 % Final   Neutrophils Relative % 01/12/2022 40  % Final   Neutro Abs 01/12/2022 1.2 (L)  1.7 - 7.7 K/uL Final   Lymphocytes Relative 01/12/2022 47  % Final   Lymphs Abs 01/12/2022 1.4  0.7 - 4.0 K/uL Final   Monocytes Relative 01/12/2022 11  % Final   Monocytes Absolute 01/12/2022 0.4  0.1 - 1.0 K/uL Final   Eosinophils Relative 01/12/2022 1  % Final   Eosinophils Absolute 01/12/2022 0.0  0.0 - 0.5 K/uL Final   Basophils Relative 01/12/2022 1  % Final   Basophils Absolute 01/12/2022 0.0  0.0 - 0.1 K/uL Final   Immature Granulocytes 01/12/2022 0  % Final   Abs Immature Granulocytes 01/12/2022 0.00  0.00 - 0.07 K/uL Final   Performed at [facility], [place] [address] [place], [place] [address]   Sodium 01/12/2022 136  135 - 145 mmol/L Final   Potassium 01/12/2022 3.2 (L)  3.5 - 5.1 mmol/L Final   Chloride 01/12/2022 104  98 - 111 mmol/L Final   CO2 01/12/2022 25  22 - 32 mmol/L Final   Glucose, Bld 01/12/2022 96  70 - 99 mg/dL Final   Glucose reference range applies only to samples taken after fasting for at least 8 hours.   BUN 01/12/2022 5 (L)  6 - 20 mg/dL Final   Creatinine, Ser 01/12/2022 0.99  0.61 - 1.24 mg/dL Final   Calcium 01/12/2022 8.3 (L)  8.9 - 10.3 mg/dL Final   Total Protein 01/12/2022 6.4 (L)  6.5 - 8.1 g/dL Final   Albumin 01/12/2022 3.6  3.5 - 5.0 g/dL Final   AST 01/12/2022 66 (H)  15 - 41 U/L Final   ALT 01/12/2022  32  0 - 44 U/L Final   Alkaline Phosphatase 01/12/2022 71  38 - 126 U/L Final   Total Bilirubin 01/12/2022 1.0  0.3 - 1.2 mg/dL Final   GFR, Estimated 01/12/2022 >60  >60 mL/min Final   Comment: (NOTE) Calculated using the CKD-EPI Creatinine Equation (2021)    Anion gap 01/12/2022 7  5 - 15 Final   Performed at [facility], [place] [address], [place], [place] [address]   Lipase 01/12/2022 25  11 - 51 U/L Final   Performed at [facility], [place] [address] [place], [place] [address]   Color, Urine 01/12/2022 STRAW (A)  YELLOW Final   APPearance 01/12/2022 CLEAR  CLEAR Final   Specific Gravity, Urine 01/12/2022 1.020  1.005 - 1.030 Final   pH 01/12/2022 7.0  5.0 - 8.0 Final   Glucose, UA 01/12/2022 NEGATIVE  NEGATIVE mg/dL Final   Hgb urine dipstick 01/12/2022 MODERATE (A)  NEGATIVE Final   Bilirubin Urine 01/12/2022 NEGATIVE  NEGATIVE Final   Ketones, ur 01/12/2022 5 (A)  NEGATIVE mg/dL Final   Protein, ur 01/12/2022 NEGATIVE  NEGATIVE mg/dL Final   Nitrite 01/12/2022 NEGATIVE  NEGATIVE Final   Leukocytes,Ua 01/12/2022 NEGATIVE  NEGATIVE Final   RBC / HPF 01/12/2022 0-5  0 - 5 RBC/hpf Final   WBC, UA 01/12/2022 0-5  0 - 5 WBC/hpf Final   Bacteria, UA 01/12/2022 NONE SEEN  NONE SEEN Final   Performed at [facility], [place] [address], [place], [place] [address]   Magnesium 01/12/2022 1.5 (L)  1.7 - 2.4 mg/dL Final   Performed at [facility] [address] [place], [place] [address]   Prothrombin Time 01/12/2022 12.6  11.4 - 15.2 seconds Final   INR 01/12/2022 1.0  0.8 - 1.2 Final   Comment: (NOTE) INR goal varies based on device and disease states. Performed at [facility], [place] [address] [place], [place] [address]     Blood Alcohol level:  Lab Results  Component Value Date   ETH <10 07/01/2022   ETH 251 (H) 35/32/9924    Metabolic Disorder Labs: Lab Results  Component Value Date   HGBA1C 5.8 (H) 07/01/2022   MPG 119.76 07/01/2022   No results found for: "PROLACTIN" Lab Results  Component Value Date   CHOL 182 07/01/2022   TRIG 67 07/01/2022   HDL 75 07/01/2022   CHOLHDL 2.4 07/01/2022   VLDL 13 07/01/2022   LDLCALC 94 07/01/2022    Therapeutic Lab Levels: No results found for: "LITHIUM" No results found for: "VALPROATE" No results found for: "CBMZ"  Physical Findings   PHQ2-9    Flowsheet Row ED from 07/01/2022 in [facility]  PHQ-2 Total Score 6  PHQ-9 Total Score 20      [facility] ED from 07/01/2022 in [facility] ED from 06/29/2022 in [facility] Department at [facility] ED from 05/18/2022 in [facility] Emergency Department at [place] CATEGORY High Risk High Risk Low Risk        Musculoskeletal  Strength & Muscle Tone: within normal limits Gait & Station: normal Patient leans: N/A  Psychiatric Specialty Exam: Physical Exam Constitutional:      Appearance: the patient is not toxic-appearing.  Pulmonary:     Effort: Pulmonary effort is normal.  Neurological:     General: No focal deficit present.     Mental  Status: the patient is alert and oriented to person, place, and time.   Review of Systems  Respiratory:  Negative for shortness of breath.   Cardiovascular:  Negative for chest pain.  Gastrointestinal:  Negative for abdominal pain, constipation, diarrhea, nausea and vomiting.  Neurological:  Negative for headaches.      BP (!) 150/93   Pulse 83   Temp 98.5 F (36.9 C) (Oral)   Resp 16   SpO2 100%   General Appearance: Fairly Groomed  Eye Contact:  Good  Speech:  Clear and Coherent  Volume:  Normal  Mood:  Euthymic  Affect:  Congruent  Thought Process:  Coherent  Orientation:  Full (Time, Place, and Person)  Thought Content: Logical   Suicidal Thoughts:  No  Homicidal Thoughts:  No  Memory:  Immediate;   Good  Judgement:  fair  Insight:  fair  Psychomotor Activity:  Normal  Concentration:  Concentration: Good  Recall:  Good  Fund of Knowledge: Good  Language: Good  Akathisia:  No  Handed:    AIMS (if indicated): not done  Assets:  Communication Skills Desire for Improvement Leisure Time Physical Health  ADL's:  Intact  Cognition: WNL  Sleep:  Fair   Treatment Plan Summary: Daily contact with patient to assess and evaluate symptoms and progress in treatment  and Medication management  Status: Voluntary, no documented self-harm behaviors recently, no remote history of self-harm.  Feel that the patient's presentation with suicidal thoughts with a plan disappeared rapidly once he was assured he be given treatment for his alcohol use disorder.  He is subsequently denied suicidal thoughts and has demonstrated engagement in his treatment plan.  Alcohol use disorder, currently in moderate withdrawal, history of complicated withdrawal without seizures or DTs - CIWA with Ativan taper and extra as needed Ativan - Gabapentin 300 mg 3 times daily - Thiamine  Medical: AST/ALT of 138/143, synthetic function appears okay with normal T. bili and albumin.  Platelets are low. Other labs unremarkable  Corky Sox, MD 07/02/2022 2:28 PM

## 2022-07-02 NOTE — BH IP Treatment Plan (Signed)
Interdisciplinary Treatment and Diagnostic Plan Update  07/02/2022 Time of Session: 9:34AM Austin [name] MRN: [id number]  Diagnosis:  Final diagnoses:  Alcohol abuse  Bereavement     Current Medications:  Current Facility-Administered Medications  Medication Dose Route Frequency Provider Last Rate Last Admin   acetaminophen (TYLENOL) tablet 650 mg  650 mg Oral Q6H PRN Tharon Aquas, NP   650 mg at 07/02/22 1245   alum & mag hydroxide-simeth (MAALOX/MYLANTA) 200-200-20 MG/5ML suspension 30 mL  30 mL Oral Q4H PRN Tharon Aquas, NP       gabapentin (NEURONTIN) tablet 300 mg  300 mg Oral TID Tharon Aquas, NP   300 mg at 07/02/22 7858   hydrOXYzine (ATARAX) tablet 25 mg  25 mg Oral Q6H PRN Tharon Aquas, NP   25 mg at 07/02/22 1245   loperamide (IMODIUM) capsule 2-4 mg  2-4 mg Oral PRN Tharon Aquas, NP       LORazepam (ATIVAN) tablet 1 mg  1 mg Oral Q6H PRN Tharon Aquas, NP       LORazepam (ATIVAN) tablet 1 mg  1 mg Oral QID Tharon Aquas, NP   1 mg at 07/02/22 8502   Followed by   Derrill Memo ON 07/03/2022] LORazepam (ATIVAN) tablet 1 mg  1 mg Oral TID Tharon Aquas, NP       Followed by   Derrill Memo ON 07/04/2022] LORazepam (ATIVAN) tablet 1 mg  1 mg Oral BID Tharon Aquas, NP       Followed by   Derrill Memo ON 07/05/2022] LORazepam (ATIVAN) tablet 1 mg  1 mg Oral Daily Tharon Aquas, NP       magnesium hydroxide (MILK OF MAGNESIA) suspension 30 mL  30 mL Oral Daily PRN Tharon Aquas, NP       multivitamin with minerals tablet 1 tablet  1 tablet Oral Daily Tharon Aquas, NP   1 tablet at 07/02/22 0936   ondansetron (ZOFRAN-ODT) disintegrating tablet 4 mg  4 mg Oral Q6H PRN Tharon Aquas, NP       thiamine (VITAMIN B1) tablet 100 mg  100 mg Oral Daily Tharon Aquas, NP   100 mg at 07/02/22 7741   traZODone (DESYREL) tablet 50 mg  50 mg Oral QHS PRN Tharon Aquas, NP       Current Outpatient Medications   Medication Sig Dispense Refill   acetaminophen (TYLENOL) 500 MG tablet Take 1,000 mg by mouth every 6 (six) hours as needed for headache (pain).     dicyclomine (BENTYL) 20 MG tablet Take 1 tablet (20 mg total) by mouth 2 (two) times daily as needed for up to 20 doses for spasms. 20 tablet 0   LEXAPRO 5 MG tablet Take 5 mg by mouth every morning.     loperamide (IMODIUM) 2 MG capsule Take 2 mg by mouth 4 (four) times daily as needed for diarrhea or loose stools.     mirtazapine (REMERON) 7.5 MG tablet Take 7.5 mg by mouth at bedtime.     ondansetron (ZOFRAN) 8 MG tablet Take 8 mg by mouth 3 (three) times daily as needed for nausea or vomiting.     potassium chloride (KLOR-CON) 10 MEQ tablet Take 10 mEq by mouth 2 (two) times daily.     PTA Medications: Prior to Admission medications   Medication Sig Start Date End Date Taking? Authorizing Provider  acetaminophen (TYLENOL) 500 MG tablet Take 1,000 mg by mouth every 6 (  six) hours as needed for headache (pain).   Yes [provider]  dicyclomine (BENTYL) 20 MG tablet Take 1 tablet (20 mg total) by mouth 2 (two) times daily as needed for up to 20 doses for spasms. 05/08/22  Yes Trifan, Carola Rhine, MD  LEXAPRO 5 MG tablet Take 5 mg by mouth every morning. 06/22/22  Yes [provider]  loperamide (IMODIUM) 2 MG capsule Take 2 mg by mouth 4 (four) times daily as needed for diarrhea or loose stools. 05/17/22  Yes [provider]  mirtazapine (REMERON) 7.5 MG tablet Take 7.5 mg by mouth at bedtime. 06/22/22  Yes [provider]  ondansetron (ZOFRAN) 8 MG tablet Take 8 mg by mouth 3 (three) times daily as needed for nausea or vomiting. 06/22/22  Yes [provider]  potassium chloride (KLOR-CON) 10 MEQ tablet Take 10 mEq by mouth 2 (two) times daily. 06/18/22  Yes [provider]    Patient Stressors: Loss of significant other   Substance abuse    Patient Strengths: Ability for insight  Average or  above average intelligence  Capable of independent living  General fund of knowledge  Motivation for treatment/growth  Supportive family/friends  Work skills   Treatment Modalities: Medication Management, Group therapy, Case management,  1 to 1 session with clinician, Psychoeducation, Recreational therapy.   Physician Treatment Plan for Primary and Secondary Diagnosis:  Final diagnoses:  Alcohol abuse  Bereavement   Long Term Goal(s): Improvement in symptoms so as ready for discharge  Short Term Goals: Patient will verbalize feelings in meetings with treatment team members. Patient will attend at least of 50% of the groups daily. Pt will complete the PHQ9 on admission, day 3 and discharge. Patient will take medications as prescribed daily.  Medication Management: Evaluate patient's response, side effects, and tolerance of medication regimen.  Therapeutic Interventions: 1 to 1 sessions, Unit Group sessions and Medication administration.  Evaluation of Outcomes: Progressing  LCSW Treatment Plan for Primary Diagnosis:  Final diagnoses:  Alcohol abuse  Bereavement    Long Term Goal(s): Safe transition to appropriate next level of care at discharge.  Short Term Goals: Facilitate acceptance of mental health diagnosis and concerns through verbal commitment to aftercare plan and appointments at discharge., Patient will identify one social support prior to discharge to aid in patient's recovery., Patient will attend AA/NA groups as scheduled., Identify minimum of 2 triggers associated with mental health/substance abuse issues with treatment team members., and Increase skills for wellness and recovery by attending 50% of scheduled groups.  Therapeutic Interventions: Assess for all discharge needs, 1 to 1 time with Education officer, museum, Explore available resources and support systems, Assess for adequacy in community support network, Educate family and significant other(s) on suicide prevention,  Complete Psychosocial Assessment, Interpersonal group therapy.  Evaluation of Outcomes: Progressing   Progress in Treatment: Attending groups: No. Participating in groups: No. Taking medication as prescribed: Yes. Toleration medication: Yes. Family/Significant other contact made: No, will contact:  patient's Cousin/Sis Ursala  Patient understands diagnosis: Yes. Discussing patient identified problems/goals with staff: Yes. Medical problems stabilized or resolved: Yes. Denies suicidal/homicidal ideation: Yes. Issues/concerns per patient self-inventory: Yes. Other: substance use and treatment needed  New problem(s) identified: No, Describe:  other than reported at admission  New Short Term/Long Term Goal(s): Safe transition to appropriate next level of care at discharge, Engage patient in therapeutic group addressing interpersonal concerns. Engage patient in aftercare planning with referrals and resources, Increase ability to appropriately verbalize feelings, Facilitate  acceptance of mental health diagnosis and concerns and Identify triggers associated with mental health/substance abuse issues.    Patient Goals:  Patient reports an interest in [facility] services at discharge.   Discharge Plan or Barriers: MD sent referral to Steele Sizer at [facility] outpatient for CDIOP appt.   Reason for Continuation of Hospitalization: Medication stabilization Withdrawal symptoms  Estimated Length of Stay: 3-5 days  Last [place] Suicide Severity Risk Score: [place] ED from 07/01/2022 in [facility] ED from 06/29/2022 in [facility] Emergency Department at [facility] ED from 05/18/2022 in [facility] Emergency Department at [place] High Risk High Risk Low Risk       Last [facility] 2/9 Scores:    07/01/2022    6:44 PM 07/01/2022    5:36 PM  Depression screen PHQ 2/9  Decreased Interest 3 3  Down, Depressed, Hopeless 3 3   PHQ - 2 Score 6 6  Altered sleeping 3 3  Tired, decreased energy 3 3  Change in appetite 3 3  Feeling bad or failure about yourself  1 1  Trouble concentrating 1 1  Moving slowly or fidgety/restless 2 2  Suicidal thoughts 1 1  PHQ-9 Score 20 20  Difficult doing work/chores Very difficult Not difficult at all    Scribe for Treatment Team: Gigi Gin 07/02/2022 2:36 PM

## 2022-07-02 NOTE — ED Notes (Signed)
Patient received lying in bed.  Patient encouraged to get up for breakfast however he refused.  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  When patient gets out of bed RN will assist him in counting steps to bathrroom, nursing station and dayroom help familiarize him with unit and advance independent ambulation.  Patient will also be moved to a room closer to bathroom and nursing station.  No evidence of withdrawal at this time and he denies avh shi or plan.  Will keep close watch on patient and maintain safety on unit.  Will assist as needed.

## 2022-07-02 NOTE — ED Notes (Signed)
Patient continues with one to one observer for safety. Patient is blind and walks with a cane. Patient needs assistance for all activities. Willl continue to monitor for safety.

## 2022-07-02 NOTE — ED Notes (Signed)
Pt sleeping@this time. Breathing even and unlabored. Will continue to monitor for safety 

## 2022-07-02 NOTE — ED Notes (Signed)
Pt is a 57 y.o male who presented voluntarily to [facility] behavioral health for alcohol detox and bereavement. Pt is blind and was accompanied by his cousin. Patient reports he has been drinking alcohol daily for over 10 years. He states his girlfriend passed away on June 27, 2022 and his drinking has increased. He has been drinking 1/5 vodka daily since she passed away. Prior to her passing, he was drinking 5 times/week, 1 pint vodka on the weekends and 1/2 pint vodka 3 weekdays a week. His last drink was 10:30AM today when he had 1/3 pint vodka. Pt denies history of withdrawal seizures of delirium tremens. Pt reports use of marijuana once every other month. His last use of marijuana was this past weekend when he had "1 pull off a joint".  Pt reports poor appetite and poor sleep. He reports he sleeps maybe 3 hours/night, feels tired during the day. He lives at home by himself, attends to his own ADLs. He states he is blind.Pt denies suicidal, homicidal or violent ideations. He denies auditory visual hallucinations or paranoia.   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 Patient's skin assessment completed, oriented to the unit now in bed appears asleep.

## 2022-07-03 ENCOUNTER — Encounter (HOSPITAL_COMMUNITY): Payer: Self-pay | Admitting: Psychiatry

## 2022-07-03 DIAGNOSIS — F1010 Alcohol abuse, uncomplicated: Secondary | ICD-10-CM | POA: Diagnosis not present

## 2022-07-03 NOTE — ED Notes (Signed)
Patient asked this nurse for something to help him sleep, due to feeling anxious. I advised that he has trazodone on file however that can not be taken until later tonight. However he has medication on file to help with the anxiety

## 2022-07-03 NOTE — ED Notes (Signed)
Pt is currently sleeping, no distress noted, environmental check complete, will continue to monitor patient for safety. ? ?

## 2022-07-03 NOTE — ED Notes (Signed)
Patient complaint of feeling dizzy and weak. This nurse took his blood pressure which was 150/106 manually. Patient is being given a muffin and cereal to eat in his room. I will check his blood pressure again after he eats.

## 2022-07-03 NOTE — ED Notes (Signed)
Pt in bed at this hour. No apparent distress. Pt states that he is agitated, but appears calm. Pt states that he cannot sleep. Did nap some today. Informed pt that sleep medication will be given later and may not be as helpful to him as he expects because he has slept during the day. Pt verbalized understanding. Endorses headache, anxiety and depression. RR even and unlabored. Monitored for safety. Pt on 1:1 observation.

## 2022-07-03 NOTE — ED Notes (Signed)
Patient is lying in room quietly no distress noted, sitter outside of room. Will continue to monitor patient for safety

## 2022-07-03 NOTE — ED Notes (Signed)
Pt is in the bed sleeping. Respirations are even and unlabored. No acute distress noted. Will continue to monitor for safety. 

## 2022-07-03 NOTE — ED Notes (Signed)
Pt resting at this hour. No apparent distress. RR even and unlabored. Monitored for safety.

## 2022-07-03 NOTE — ED Notes (Signed)
Provider has been made aware of patients elevated Blood pressure

## 2022-07-03 NOTE — ED Provider Notes (Signed)
Behavioral Health Progress Note  Date and Time: 07/03/2022 12:12 PM Name: Christiaan Strebeck Brodersen MRN:  [id number]  Subjective:   Neymar Dowe Seneca is a 57 yr old male who presented to [facility] on 1/23 with SI with a plan and requesting Detox from EtOH, he was admitted to [facility] on 1/25.  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  He reports that he is doing okay today.  He reports he continues to have withdrawal symptoms-shakes, nausea, diarrhea, dizziness, weakness, and headaches.  He does report that he withdrawal symptoms are improving.  Encouraged him to ask for the as needed medications for symptom control.  He reports no seizure symptoms.  He reports some cravings today.  He reports his plan is still to be interviewed for the CD-IOP program on Tuesday.  He reports no SI, HI, or AVH.  He reports his sleep is good.  He reports appetite is good.  He reports no other concerns at present.  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 Works at [employer].  Additional Social History:    Pain Medications: See PTA medication list Prescriptions: See PTA medication list Over the Counter: See PTA medication list History of alcohol / drug use?: Yes Longest period of sobriety (when/how long): none reported Negative Consequences of Use: Financial,  Personal relationships Withdrawal Symptoms: Change in blood pressure, Diarrhea, Nausea / Vomiting, Sweats, Tremors, Other (Comment), None (headaches) Name of Substance 1: alcohol 1 - Age of First Use: unknown 1 - Amount (size/oz): fifth 1 - Frequency: daily 1 - Duration: drinking a fifth daily since 06/22/2022 1 - Last Use / Amount: 1 glass vodka and ginger ale 1 - Method of Aquiring: purchases from stores 1- Route of Use: oral                  Sleep: Good  Appetite:  Good  Current Medications:  Current Facility-Administered Medications  Medication Dose Route Frequency Provider Last Rate Last Admin   acetaminophen (TYLENOL) tablet 650 mg  650 mg Oral Q6H PRN Tharon Aquas, NP   650 mg at 07/02/22 1245   alum & mag hydroxide-simeth (MAALOX/MYLANTA) 200-200-20 MG/5ML suspension 30 mL  30 mL Oral Q4H PRN Tharon Aquas, NP       gabapentin (NEURONTIN) tablet 300 mg  300 mg Oral TID Tharon Aquas, NP   300 mg at 07/03/22 0943   hydrOXYzine (ATARAX) tablet 25 mg  25 mg Oral Q6H PRN Tharon Aquas, NP   25 mg at 07/02/22 2134   loperamide (IMODIUM) capsule 2-4 mg  2-4 mg Oral PRN Tharon Aquas, NP       LORazepam (ATIVAN) tablet 1 mg  1 mg Oral Q6H PRN Tharon Aquas, NP       LORazepam (ATIVAN) tablet 1 mg  1 mg Oral TID Tharon Aquas, NP   1 mg at 07/03/22 [phone]  Followed by   Derrill Memo ON 07/04/2022] LORazepam (ATIVAN) tablet 1 mg  1 mg Oral BID Tharon Aquas, NP       Followed by   Derrill Memo ON 07/05/2022] LORazepam (ATIVAN) tablet 1 mg  1 mg Oral Daily Tharon Aquas, NP       magnesium hydroxide (MILK OF MAGNESIA) suspension 30 mL  30 mL Oral Daily PRN Tharon Aquas, NP       multivitamin with minerals tablet 1 tablet  1 tablet Oral Daily Tharon Aquas, NP   1 tablet at 07/03/22 0943   ondansetron (ZOFRAN-ODT) disintegrating tablet 4 mg  4 mg Oral Q6H PRN Tharon Aquas, NP       thiamine (VITAMIN B1) tablet 100 mg   100 mg Oral Daily Tharon Aquas, NP   100 mg at 07/03/22 6440   traZODone (DESYREL) tablet 50 mg  50 mg Oral QHS PRN Tharon Aquas, NP       Current Outpatient Medications  Medication Sig Dispense Refill   acetaminophen (TYLENOL) 500 MG tablet Take 1,000 mg by mouth every 6 (six) hours as needed for headache (pain).     dicyclomine (BENTYL) 20 MG tablet Take 1 tablet (20 mg total) by mouth 2 (two) times daily as needed for up to 20 doses for spasms. 20 tablet 0   LEXAPRO 5 MG tablet Take 5 mg by mouth every morning.     loperamide (IMODIUM) 2 MG capsule Take 2 mg by mouth 4 (four) times daily as needed for diarrhea or loose stools.     mirtazapine (REMERON) 7.5 MG tablet Take 7.5 mg by mouth at bedtime.     ondansetron (ZOFRAN) 8 MG tablet Take 8 mg by mouth 3 (three) times daily as needed for nausea or vomiting.     potassium chloride (KLOR-CON) 10 MEQ tablet Take 10 mEq by mouth 2 (two) times daily.      Labs  Lab Results:  Admission on 07/01/2022  Component Date Value Ref Range Status   SARS Coronavirus 2 by RT PCR 07/01/2022 NEGATIVE  NEGATIVE Final   Comment: (NOTE) SARS-CoV-2 target nucleic acids are NOT DETECTED.  The SARS-CoV-2 RNA is generally detectable in upper respiratory specimens during the acute phase of infection. The lowest concentration of SARS-CoV-2 viral copies this assay can detect is 138 copies/mL. A negative result does not preclude SARS-Cov-2 infection and should not be used as the sole basis for treatment or other patient management decisions. A negative result may occur with  improper specimen collection/handling, submission of specimen other than nasopharyngeal swab, presence of viral mutation(s) within the areas targeted by this assay, and inadequate number of viral copies(<138 copies/mL). A negative result must be combined with clinical observations, patient history, and epidemiological information. The expected result is Negative.  Fact  Sheet for Patients:  EntrepreneurPulse.com.au  Fact Sheet for Healthcare Providers:  IncredibleEmployment.be  This test is no                          t yet approved or cleared by the [place] FDA and  has been authorized for detection and/or diagnosis of SARS-CoV-2 by FDA under an Emergency Use Authorization (EUA). This EUA will remain  in effect (meaning this test can be used) for the duration of the COVID-19 declaration under Section 564(b)(1) of the Act, 21 U.S.C.section 360bbb-3(b)(1), unless the authorization is terminated  or revoked sooner.  Influenza A by PCR 07/01/2022 NEGATIVE  NEGATIVE Final   Influenza B by PCR 07/01/2022 NEGATIVE  NEGATIVE Final   Comment: (NOTE) The Xpert Xpress SARS-CoV-2/FLU/RSV plus assay is intended as an aid in the diagnosis of influenza from Nasopharyngeal swab specimens and should not be used as a sole basis for treatment. Nasal washings and aspirates are unacceptable for Xpert Xpress SARS-CoV-2/FLU/RSV testing.  Fact Sheet for Patients: EntrepreneurPulse.com.au  Fact Sheet for Healthcare Providers: IncredibleEmployment.be  This test is not yet approved or cleared by the [place] FDA and has been authorized for detection and/or diagnosis of SARS-CoV-2 by FDA under an Emergency Use Authorization (EUA). This EUA will remain in effect (meaning this test can be used) for the duration of the COVID-19 declaration under Section 564(b)(1) of the Act, 21 U.S.C. section 360bbb-3(b)(1), unless the authorization is terminated or revoked.     Resp Syncytial Virus by PCR 07/01/2022 NEGATIVE  NEGATIVE Final   Comment: (NOTE) Fact Sheet for Patients: EntrepreneurPulse.com.au  Fact Sheet for Healthcare Providers: IncredibleEmployment.be  This test is not yet approved or cleared by the [place] FDA and has been authorized  for detection and/or diagnosis of SARS-CoV-2 by FDA under an Emergency Use Authorization (EUA). This EUA will remain in effect (meaning this test can be used) for the duration of the COVID-19 declaration under Section 564(b)(1) of the Act, 21 U.S.C. section 360bbb-3(b)(1), unless the authorization is terminated or revoked.  Performed at [facility], [place] [address], [place],  [address]    WBC 07/01/2022 3.2 (L)  4.0 - 10.5 K/uL Final   RBC 07/01/2022 4.32  4.22 - 5.81 MIL/uL Final   Hemoglobin 07/01/2022 14.1  13.0 - 17.0 g/dL Final   HCT 07/01/2022 39.6  39.0 - 52.0 % Final   MCV 07/01/2022 91.7  80.0 - 100.0 fL Final   MCH 07/01/2022 32.6  26.0 - 34.0 pg Final   MCHC 07/01/2022 35.6  30.0 - 36.0 g/dL Final   RDW 07/01/2022 12.8  11.5 - 15.5 % Final   Platelets 07/01/2022 103 (L)  150 - 400 K/uL Final   REPEATED TO VERIFY   nRBC 07/01/2022 0.0  0.0 - 0.2 % Final   Neutrophils Relative % 07/01/2022 38  % Final   Neutro Abs 07/01/2022 1.2 (L)  1.7 - 7.7 K/uL Final   Lymphocytes Relative 07/01/2022 52  % Final   Lymphs Abs 07/01/2022 1.7  0.7 - 4.0 K/uL Final   Monocytes Relative 07/01/2022 9  % Final   Monocytes Absolute 07/01/2022 0.3  0.1 - 1.0 K/uL Final   Eosinophils Relative 07/01/2022 0  % Final   Eosinophils Absolute 07/01/2022 0.0  0.0 - 0.5 K/uL Final   Basophils Relative 07/01/2022 1  % Final   Basophils Absolute 07/01/2022 0.0  0.0 - 0.1 K/uL Final   Immature Granulocytes 07/01/2022 0  % Final   Abs Immature Granulocytes 07/01/2022 0.00  0.00 - 0.07 K/uL Final   Performed at [facility], [place] [address], [place], [place] [address]   Sodium 07/01/2022 137  135 - 145 mmol/L Final   Potassium 07/01/2022 3.8  3.5 - 5.1 mmol/L Final   Chloride 07/01/2022 99  98 - 111 mmol/L Final   CO2 07/01/2022 30  22 - 32 mmol/L Final   Glucose, Bld 07/01/2022 113 (H)  70 - 99 mg/dL Final   Glucose reference range applies only to samples taken after fasting for at  least 8 hours.   BUN  07/01/2022 5 (L)  6 - 20 mg/dL Final   Creatinine, Ser 07/01/2022 1.06  0.61 - 1.24 mg/dL Final   Calcium 07/01/2022 9.2  8.9 - 10.3 mg/dL Final   Total Protein 07/01/2022 6.8  6.5 - 8.1 g/dL Final   Albumin 07/01/2022 4.0  3.5 - 5.0 g/dL Final   AST 07/01/2022 138 (H)  15 - 41 U/L Final   ALT 07/01/2022 143 (H)  0 - 44 U/L Final   Alkaline Phosphatase 07/01/2022 129 (H)  38 - 126 U/L Final   Total Bilirubin 07/01/2022 1.0  0.3 - 1.2 mg/dL Final   GFR, Estimated 07/01/2022 >60  >60 mL/min Final   Comment: (NOTE) Calculated using the CKD-EPI Creatinine Equation (2021)    Anion gap 07/01/2022 8  5 - 15 Final   Performed at [facility] [address], [place], [place] [address]   Hgb A1c MFr Bld 07/01/2022 5.8 (H)  4.8 - 5.6 % Final   Comment: (NOTE) Pre diabetes:          5.7%-6.4%  Diabetes:              >6.4%  Glycemic control for   <7.0% adults with diabetes    Mean Plasma Glucose 07/01/2022 119.76  mg/dL Final   Performed at [facility], [place] [address], [place], [place] [address]   Alcohol, Ethyl (B) 07/01/2022 <10  <10 mg/dL Final   Comment: (NOTE) Lowest detectable limit for serum alcohol is 10 mg/dL.  For medical purposes only. Performed at [facility], [place] [address] [place] [address] [place], [place] [address]    TSH 07/01/2022 2.439  0.350 - 4.500 uIU/mL Final   Comment: Performed by a 3rd Generation assay with a functional sensitivity of <=0.01 uIU/mL. Performed at [facility], [place] [address] [place], [place] [address]    POC Amphetamine UR 07/01/2022 None Detected  NONE DETECTED (Cut Off Level 1000 ng/mL) Final   POC Secobarbital (BAR) 07/01/2022 None Detected  NONE DETECTED (Cut Off Level 300 ng/mL) Final   POC Buprenorphine (BUP) 07/01/2022 None Detected  NONE DETECTED (Cut Off Level 10 ng/mL) Final   POC Oxazepam (BZO) 07/01/2022 Positive (A)  NONE DETECTED (Cut Off Level 300 ng/mL) Final   POC Cocaine UR 07/01/2022 None  Detected  NONE DETECTED (Cut Off Level 300 ng/mL) Final   POC Methamphetamine UR 07/01/2022 None Detected  NONE DETECTED (Cut Off Level 1000 ng/mL) Final   POC Morphine 07/01/2022 None Detected  NONE DETECTED (Cut Off Level 300 ng/mL) Final   POC Methadone UR 07/01/2022 None Detected  NONE DETECTED (Cut Off Level 300 ng/mL) Final   POC Oxycodone UR 07/01/2022 None Detected  NONE DETECTED (Cut Off Level 100 ng/mL) Final   POC Marijuana UR 07/01/2022 None Detected  NONE DETECTED (Cut Off Level 50 ng/mL) Final   SARSCOV2ONAVIRUS 2 AG 07/01/2022 NEGATIVE  NEGATIVE Final   Comment: (NOTE) SARS-CoV-2 antigen NOT DETECTED.   Negative results are presumptive.  Negative results do not preclude SARS-CoV-2 infection and should not be used as the sole basis for treatment or other patient management decisions, including infection  control decisions, particularly in the presence of clinical signs and  symptoms consistent with COVID-19, or in those who have been in contact with the virus.  Negative results must be combined with clinical observations, patient history, and epidemiological information. The expected result is Negative.  Fact Sheet for Patients: HandmadeRecipes.com.cy  Fact Sheet for Healthcare Providers: FuneralLife.at  This  test is not yet approved or cleared by the [place] and  has been authorized for detection and/or diagnosis of SARS-CoV-2 by FDA under an Emergency Use Authorization (EUA).  This EUA will remain in effect (meaning this test can be used) for the duration of  the COV                          ID-19 declaration under Section 564(b)(1) of the Act, 21 U.S.C. section 360bbb-3(b)(1), unless the authorization is terminated or revoked sooner.     Cholesterol 07/01/2022 182  0 - 200 mg/dL Final   Triglycerides 07/01/2022 67  <150 mg/dL Final   HDL 07/01/2022 75  >40 mg/dL Final   Total CHOL/HDL Ratio 07/01/2022 2.4   RATIO Final   VLDL 07/01/2022 13  0 - 40 mg/dL Final   LDL Cholesterol 07/01/2022 94  0 - 99 mg/dL Final   Comment:        Total Cholesterol/HDL:CHD Risk Coronary Heart Disease Risk Table                     Men   Women  1/2 Average Risk   3.4   3.3  Average Risk       5.0   4.4  2 X Average Risk   9.6   7.1  3 X Average Risk  23.4   11.0        Use the calculated Patient Ratio above and the CHD Risk Table to determine the patient's CHD Risk.        ATP III CLASSIFICATION (LDL):  <100     mg/dL   Optimal  100-129  mg/dL   Near or Above                    Optimal  130-159  mg/dL   Borderline  160-189  mg/dL   High  >190     mg/dL   Very High Performed at [facility] [address], [place], [place] [address]   Admission on 06/29/2022, Discharged on 06/30/2022  Component Date Value Ref Range Status   Sodium 06/29/2022 138  135 - 145 mmol/L Final   Potassium 06/29/2022 3.9  3.5 - 5.1 mmol/L Final   Chloride 06/29/2022 99  98 - 111 mmol/L Final   CO2 06/29/2022 28  22 - 32 mmol/L Final   Glucose, Bld 06/29/2022 81  70 - 99 mg/dL Final   Glucose reference range applies only to samples taken after fasting for at least 8 hours.   BUN 06/29/2022 9  6 - 20 mg/dL Final   Creatinine, Ser 06/29/2022 0.99  0.61 - 1.24 mg/dL Final   Calcium 06/29/2022 8.5 (L)  8.9 - 10.3 mg/dL Final   Total Protein 06/29/2022 6.7  6.5 - 8.1 g/dL Final   Albumin 06/29/2022 3.8  3.5 - 5.0 g/dL Final   AST 06/29/2022 207 (H)  15 - 41 U/L Final   ALT 06/29/2022 191 (H)  0 - 44 U/L Final   Alkaline Phosphatase 06/29/2022 138 (H)  38 - 126 U/L Final   Total Bilirubin 06/29/2022 0.4  0.3 - 1.2 mg/dL Final   GFR, Estimated 06/29/2022 >60  >60 mL/min Final   Comment: (NOTE) Calculated using the CKD-EPI Creatinine Equation (2021)    Anion gap 06/29/2022 11  5 - 15 Final   Performed at [facility], [place],  [place] [address]   Alcohol, Ethyl (B) 06/29/2022 251 (H)  <10 mg/dL  Final   Comment: (NOTE) Lowest detectable limit for serum alcohol is 10 mg/dL.  For medical purposes only. Performed at [facility], [place] [address], [place], [place] [address]    WBC 06/29/2022 3.2 (L)  4.0 - 10.5 K/uL Final   RBC 06/29/2022 4.33  4.22 - 5.81 MIL/uL Final   Hemoglobin 06/29/2022 14.2  13.0 - 17.0 g/dL Final   HCT 06/29/2022 39.4  39.0 - 52.0 % Final   MCV 06/29/2022 91.0  80.0 - 100.0 fL Final   MCH 06/29/2022 32.8  26.0 - 34.0 pg Final   MCHC 06/29/2022 36.0  30.0 - 36.0 g/dL Final   RDW 06/29/2022 13.2  11.5 - 15.5 % Final   Platelets 06/29/2022 125 (L)  150 - 400 K/uL Final   nRBC 06/29/2022 0.0  0.0 - 0.2 % Final   Neutrophils Relative % 06/29/2022 44  % Final   Neutro Abs 06/29/2022 1.4 (L)  1.7 - 7.7 K/uL Final   Lymphocytes Relative 06/29/2022 51  % Final   Lymphs Abs 06/29/2022 1.6  0.7 - 4.0 K/uL Final   Monocytes Relative 06/29/2022 4  % Final   Monocytes Absolute 06/29/2022 0.1  0.1 - 1.0 K/uL Final   Eosinophils Relative 06/29/2022 1  % Final   Eosinophils Absolute 06/29/2022 0.0  0.0 - 0.5 K/uL Final   Basophils Relative 06/29/2022 0  % Final   Basophils Absolute 06/29/2022 0.0  0.0 - 0.1 K/uL Final   WBC Morphology 06/29/2022 See Note   Final   ALLN   nRBC 06/29/2022 0  0 /100 WBC Final   Abs Immature Granulocytes 06/29/2022 0.00  0.00 - 0.07 K/uL Final   Performed at [facility], [place] [address] [place], [place] [address]   Acetaminophen (Tylenol), Serum 06/29/2022 <10 (L)  10 - 30 ug/mL Final   Comment: (NOTE) Therapeutic concentrations vary significantly. A range of 10-30 ug/mL  may be an effective concentration for many patients. However, some  are best treated at concentrations outside of this range. Acetaminophen concentrations >150 ug/mL at 4 hours after ingestion  and >50 ug/mL at 12 hours after ingestion are often associated with  toxic reactions.  Performed at [facility], [place] [address], [place],  [place] [address]    Salicylate Lvl 85/27/7824 <7.0 (L)  7.0 - 30.0 mg/dL Final   Performed at [facility] [address], [place], [place] [address]   Troponin I (High Sensitivity) 06/29/2022 3  <18 ng/L Final   Comment: (NOTE) Elevated high sensitivity troponin I (hsTnI) values and significant  changes across serial measurements may suggest ACS but many other  chronic and acute conditions are known to elevate hsTnI results.  Refer to the "Links" section for chest pain algorithms and additional  guidance. Performed at [facility], [place] [address] [place], [place] [address]    Troponin I (High Sensitivity) 06/29/2022 3  <18 ng/L Final   Comment: (NOTE) Elevated high sensitivity troponin I (hsTnI) values and significant  changes across serial measurements may suggest ACS but many other  chronic and acute conditions are known to elevate hsTnI results.  Refer to the "Links" section for chest pain algorithms and additional  guidance. Performed at [facility], [place] [address] [place], [place] [address]   Admission on 05/18/2022, Discharged on 05/18/2022  Component Date Value Ref Range Status  Sodium 05/18/2022 134 (L)  135 - 145 mmol/L Final   Potassium 05/18/2022 3.3 (L)  3.5 - 5.1 mmol/L Final   Chloride 05/18/2022 97 (L)  98 - 111 mmol/L Final   CO2 05/18/2022 26  22 - 32 mmol/L Final   Glucose, Bld 05/18/2022 127 (H)  70 - 99 mg/dL Final   Glucose reference range applies only to samples taken after fasting for at least 8 hours.   BUN 05/18/2022 <5 (L)  6 - 20 mg/dL Final   Creatinine, Ser 05/18/2022 0.91  0.61 - 1.24 mg/dL Final   Calcium 05/18/2022 8.8 (L)  8.9 - 10.3 mg/dL Final   Total Protein 05/18/2022 6.7  6.5 - 8.1 g/dL Final   Albumin 05/18/2022 3.8  3.5 - 5.0 g/dL Final   AST 05/18/2022 637 (H)  15 - 41 U/L Final   ALT 05/18/2022 255 (H)  0 - 44 U/L Final   Alkaline Phosphatase 05/18/2022 102  38 - 126 U/L Final   Total Bilirubin 05/18/2022 0.7  0.3 -  1.2 mg/dL Final   GFR, Estimated 05/18/2022 >60  >60 mL/min Final   Comment: (NOTE) Calculated using the CKD-EPI Creatinine Equation (2021)    Anion gap 05/18/2022 11  5 - 15 Final   Performed at [facility] [address] [place], [place] [address]   Opiates 05/18/2022 NONE DETECTED  NONE DETECTED Final   Cocaine 05/18/2022 NONE DETECTED  NONE DETECTED Final   Benzodiazepines 05/18/2022 NONE DETECTED  NONE DETECTED Final   Amphetamines 05/18/2022 NONE DETECTED  NONE DETECTED Final   Tetrahydrocannabinol 05/18/2022 POSITIVE (A)  NONE DETECTED Final   Barbiturates 05/18/2022 NONE DETECTED  NONE DETECTED Final   Comment: (NOTE) DRUG SCREEN FOR MEDICAL PURPOSES ONLY.  IF CONFIRMATION IS NEEDED FOR ANY PURPOSE, NOTIFY LAB WITHIN 5 DAYS.  LOWEST DETECTABLE LIMITS FOR URINE DRUG SCREEN Drug Class                     Cutoff (ng/mL) Amphetamine and metabolites    1000 Barbiturate and metabolites    200 Benzodiazepine                 200 Opiates and metabolites        300 Cocaine and metabolites        300 THC                            50 Performed at [facility], [place] [address], [place], [place] [address]    WBC 05/18/2022 1.7 (L)  4.0 - 10.5 K/uL Final   RBC 05/18/2022 4.81  4.22 - 5.81 MIL/uL Final   Hemoglobin 05/18/2022 15.8  13.0 - 17.0 g/dL Final   HCT 05/18/2022 42.8  39.0 - 52.0 % Final   MCV 05/18/2022 89.0  80.0 - 100.0 fL Final   MCH 05/18/2022 32.8  26.0 - 34.0 pg Final   MCHC 05/18/2022 36.9 (H)  30.0 - 36.0 g/dL Final   RDW 05/18/2022 12.5  11.5 - 15.5 % Final   Platelets 05/18/2022 112 (L)  150 - 400 K/uL Final   nRBC 05/18/2022 0.0  0.0 - 0.2 % Final   Neutrophils Relative % 05/18/2022 26  % Final   Neutro Abs 05/18/2022 0.4 (LL)  1.7 - 7.7 K/uL Final   Comment: REPEATED TO VERIFY THIS CRITICAL RESULT HAS VERIFIED AND BEEN CALLED TO BRITTANY BECK,RN BY ZELDA BEECH ON  12 12 2023 AT 1410, AND HAS BEEN READ BACK.     Lymphocytes Relative 05/18/2022  59  % Final   Lymphs Abs 05/18/2022 1.0  0.7 - 4.0 K/uL Final   Monocytes Relative 05/18/2022 13  % Final   Monocytes Absolute 05/18/2022 0.2  0.1 - 1.0 K/uL Final   Eosinophils Relative 05/18/2022 1  % Final   Eosinophils Absolute 05/18/2022 0.0  0.0 - 0.5 K/uL Final   Basophils Relative 05/18/2022 1  % Final   Basophils Absolute 05/18/2022 0.0  0.0 - 0.1 K/uL Final   WBC Morphology 05/18/2022 MORPHOLOGY UNREMARKABLE   Final   RBC Morphology 05/18/2022 MORPHOLOGY UNREMARKABLE   Final   Smear Review 05/18/2022 MORPHOLOGY UNREMARKABLE   Final   Immature Granulocytes 05/18/2022 0  % Final   Abs Immature Granulocytes 05/18/2022 0.00  0.00 - 0.07 K/uL Final   Performed at [facility], [place] [address] [place], [place] [address]   Alcohol, Ethyl (B) 05/18/2022 105 (H)  <10 mg/dL Final   Comment: (NOTE) Lowest detectable limit for serum alcohol is 10 mg/dL.  For medical purposes only. Performed at [facility], [place] [address], [place], [place] [address]   Admission on 05/08/2022, Discharged on 05/08/2022  Component Date Value Ref Range Status   Total hemoglobin 05/08/2022 13.5  12.0 - 16.0 g/dL Final   O2 Saturation 05/08/2022 56.7  % Final   Carboxyhemoglobin 05/08/2022 4.1 (H)  0.5 - 1.5 % Final   Methemoglobin 05/08/2022 0.7  0.0 - 1.5 % Final   Performed at [facility], [place] [address] [place], [place] [address]   WBC 05/08/2022 4.2  4.0 - 10.5 K/uL Final   RBC 05/08/2022 3.89 (L)  4.22 - 5.81 MIL/uL Final   Hemoglobin 05/08/2022 13.0  13.0 - 17.0 g/dL Final   HCT 05/08/2022 36.9 (L)  39.0 - 52.0 % Final   MCV 05/08/2022 94.9  80.0 - 100.0 fL Final   MCH 05/08/2022 33.4  26.0 - 34.0 pg Final   MCHC 05/08/2022 35.2  30.0 - 36.0 g/dL Final   RDW 05/08/2022 13.8  11.5 - 15.5 % Final   Platelets 05/08/2022 89 (L)  150 - 400 K/uL Final   Comment: Immature Platelet Fraction may be clinically indicated, consider ordering this additional test [id number] REPEATED TO  VERIFY PLATELET COUNT CONFIRMED BY SMEAR    nRBC 05/08/2022 0.0  0.0 - 0.2 % Final   Performed at [facility], [place] [address] [place], [place] [address]   Sodium 05/08/2022 140  135 - 145 mmol/L Final   Potassium 05/08/2022 3.8  3.5 - 5.1 mmol/L Final   Chloride 05/08/2022 103  98 - 111 mmol/L Final   CO2 05/08/2022 25  22 - 32 mmol/L Final   Glucose, Bld 05/08/2022 92  70 - 99 mg/dL Final   Glucose reference range applies only to samples taken after fasting for at least 8 hours.   BUN 05/08/2022 12  6 - 20 mg/dL Final   Creatinine, Ser 05/08/2022 1.00  0.61 - 1.24 mg/dL Final   Calcium 05/08/2022 8.9  8.9 - 10.3 mg/dL Final   Total Protein 05/08/2022 6.1 (L)  6.5 - 8.1 g/dL Final   Albumin 05/08/2022 3.7  3.5 - 5.0 g/dL Final   AST 05/08/2022 45 (H)  15 - 41 U/L Final   ALT 05/08/2022 35  0 - 44 U/L Final   Alkaline Phosphatase 05/08/2022 75  38 - 126 U/L  Final   Total Bilirubin 05/08/2022 0.7  0.3 - 1.2 mg/dL Final   GFR, Estimated 05/08/2022 >60  >60 mL/min Final   Comment: (NOTE) Calculated using the CKD-EPI Creatinine Equation (2021)    Anion gap 05/08/2022 12  5 - 15 Final   Performed at [facility], [place] [address], [place], [place] [address]   SARS Coronavirus 2 by RT PCR 05/08/2022 NEGATIVE  NEGATIVE Final   Comment: (NOTE) SARS-CoV-2 target nucleic acids are NOT DETECTED.  The SARS-CoV-2 RNA is generally detectable in upper respiratory specimens during the acute phase of infection. The lowest concentration of SARS-CoV-2 viral copies this assay can detect is 138 copies/mL. A negative result does not preclude SARS-Cov-2 infection and should not be used as the sole basis for treatment or other patient management decisions. A negative result may occur with  improper specimen collection/handling, submission of specimen other than nasopharyngeal swab, presence of viral mutation(s) within the areas targeted by this assay, and inadequate number of  viral copies(<138 copies/mL). A negative result must be combined with clinical observations, patient history, and epidemiological information. The expected result is Negative.  Fact Sheet for Patients:  EntrepreneurPulse.com.au  Fact Sheet for Healthcare Providers:  IncredibleEmployment.be  This test is no                          t yet approved or cleared by the [place] FDA and  has been authorized for detection and/or diagnosis of SARS-CoV-2 by FDA under an Emergency Use Authorization (EUA). This EUA will remain  in effect (meaning this test can be used) for the duration of the COVID-19 declaration under Section 564(b)(1) of the Act, 21 U.S.C.section 360bbb-3(b)(1), unless the authorization is terminated  or revoked sooner.       Influenza A by PCR 05/08/2022 NEGATIVE  NEGATIVE Final   Influenza B by PCR 05/08/2022 NEGATIVE  NEGATIVE Final   Comment: (NOTE) The Xpert Xpress SARS-CoV-2/FLU/RSV plus assay is intended as an aid in the diagnosis of influenza from Nasopharyngeal swab specimens and should not be used as a sole basis for treatment. Nasal washings and aspirates are unacceptable for Xpert Xpress SARS-CoV-2/FLU/RSV testing.  Fact Sheet for Patients: EntrepreneurPulse.com.au  Fact Sheet for Healthcare Providers: IncredibleEmployment.be  This test is not yet approved or cleared by the [place] FDA and has been authorized for detection and/or diagnosis of SARS-CoV-2 by FDA under an Emergency Use Authorization (EUA). This EUA will remain in effect (meaning this test can be used) for the duration of the COVID-19 declaration under Section 564(b)(1) of the Act, 21 U.S.C. section 360bbb-3(b)(1), unless the authorization is terminated or revoked.  Performed at [facility], [place] [address], [place], [place] [address]   Admission on 03/11/2022, Discharged on 03/12/2022  Component Date  Value Ref Range Status   Sodium 03/11/2022 132 (L)  135 - 145 mmol/L Final   Potassium 03/11/2022 3.6  3.5 - 5.1 mmol/L Final   Chloride 03/11/2022 95 (L)  98 - 111 mmol/L Final   CO2 03/11/2022 24  22 - 32 mmol/L Final   Glucose, Bld 03/11/2022 119 (H)  70 - 99 mg/dL Final   Glucose reference range applies only to samples taken after fasting for at least 8 hours.   BUN 03/11/2022 6  6 - 20 mg/dL Final   Creatinine, Ser 03/11/2022 1.12  0.61 - 1.24 mg/dL Final   Calcium 03/11/2022 9.1  8.9 - 10.3 mg/dL Final  Total Protein 03/11/2022 6.9  6.5 - 8.1 g/dL Final   Albumin 03/11/2022 4.3  3.5 - 5.0 g/dL Final   AST 03/11/2022 72 (H)  15 - 41 U/L Final   ALT 03/11/2022 41  0 - 44 U/L Final   Alkaline Phosphatase 03/11/2022 102  38 - 126 U/L Final   Total Bilirubin 03/11/2022 0.8  0.3 - 1.2 mg/dL Final   GFR, Estimated 03/11/2022 >60  >60 mL/min Final   Comment: (NOTE) Calculated using the CKD-EPI Creatinine Equation (2021)    Anion gap 03/11/2022 13  5 - 15 Final   Performed at [facility], [place], [place], [place] [address]   WBC 03/11/2022 4.2  4.0 - 10.5 K/uL Final   RBC 03/11/2022 4.40  4.22 - 5.81 MIL/uL Final   Hemoglobin 03/11/2022 14.0  13.0 - 17.0 g/dL Final   HCT 03/11/2022 39.6  39.0 - 52.0 % Final   MCV 03/11/2022 90.0  80.0 - 100.0 fL Final   MCH 03/11/2022 31.8  26.0 - 34.0 pg Final   MCHC 03/11/2022 35.4  30.0 - 36.0 g/dL Final   RDW 03/11/2022 13.4  11.5 - 15.5 % Final   Platelets 03/11/2022 124 (L)  150 - 400 K/uL Final   nRBC 03/11/2022 0.0  0.0 - 0.2 % Final   Neutrophils Relative % 03/11/2022 45  % Final   Neutro Abs 03/11/2022 1.9  1.7 - 7.7 K/uL Final   Lymphocytes Relative 03/11/2022 38  % Final   Lymphs Abs 03/11/2022 1.6  0.7 - 4.0 K/uL Final   Monocytes Relative 03/11/2022 16  % Final   Monocytes Absolute 03/11/2022 0.7  0.1 - 1.0 K/uL Final   Eosinophils Relative 03/11/2022 1  % Final   Eosinophils Absolute 03/11/2022 0.0  0.0  - 0.5 K/uL Final   Basophils Relative 03/11/2022 0  % Final   Basophils Absolute 03/11/2022 0.0  0.0 - 0.1 K/uL Final   Immature Granulocytes 03/11/2022 0  % Final   Abs Immature Granulocytes 03/11/2022 0.00  0.00 - 0.07 K/uL Final   Performed at [facility], [address] [place], [place] [address]   Lipase 03/11/2022 15  11 - 51 U/L Final   Performed at [facility], [address], [place], [place] [address]   Color, Urine 03/11/2022 YELLOW  YELLOW Final   APPearance 03/11/2022 CLEAR  CLEAR Final   Specific Gravity, Urine 03/11/2022 1.028  1.005 - 1.030 Final   pH 03/11/2022 5.5  5.0 - 8.0 Final   Glucose, UA 03/11/2022 NEGATIVE  NEGATIVE mg/dL Final   Hgb urine dipstick 03/11/2022 LARGE (A)  NEGATIVE Final   Bilirubin Urine 03/11/2022 NEGATIVE  NEGATIVE Final   Ketones, ur 03/11/2022 NEGATIVE  NEGATIVE mg/dL Final   Protein, ur 03/11/2022 TRACE (A)  NEGATIVE mg/dL Final   Nitrite 03/11/2022 NEGATIVE  NEGATIVE Final   Leukocytes,Ua 03/11/2022 NEGATIVE  NEGATIVE Final   RBC / HPF 03/11/2022 0-5  0 - 5 RBC/hpf Final   WBC, UA 03/11/2022 0-5  0 - 5 WBC/hpf Final   Mucus 03/11/2022 PRESENT   Final   Hyaline Casts, UA 03/11/2022 PRESENT   Final   Performed at [facility], [address] [place], [place] [address]   Alcohol, Ethyl (B) 03/11/2022 63 (H)  <10 mg/dL Final   Comment: (NOTE) Lowest detectable limit for serum alcohol is 10 mg/dL.  For medical purposes only. Performed at [facility], [address] [place], [place] [address]  Salicylate Lvl 17/40/8144 <7.0 (L)  7.0 - 30.0 mg/dL Final   Performed at [facility], [place], [place] [address]   Acetaminophen (Tylenol), Serum 03/11/2022 <10 (L)  10 - 30 ug/mL Final   Comment: (NOTE) Therapeutic concentrations vary significantly. A range of 10-30 ug/mL  may be an effective concentration for many patients.  However, some  are best treated at concentrations outside of this range. Acetaminophen concentrations >150 ug/mL at 4 hours after ingestion  and >50 ug/mL at 12 hours after ingestion are often associated with  toxic reactions.  Performed at [facility], [place], [place] [address]    Troponin I (High Sensitivity) 03/11/2022 <2  <18 ng/L Final   Comment: (NOTE) Elevated high sensitivity troponin I (hsTnI) values and significant  changes across serial measurements may suggest ACS but many other  chronic and acute conditions are known to elevate hsTnI results.  Refer to the "Links" section for chest pain algorithms and additional  guidance. Performed at [facility], [address] [place], [place] [address]    SARS Coronavirus 2 by RT PCR 03/11/2022 NEGATIVE  NEGATIVE Final   Comment: (NOTE) SARS-CoV-2 target nucleic acids are NOT DETECTED.  The SARS-CoV-2 RNA is generally detectable in upper respiratory specimens during the acute phase of infection. The lowest concentration of SARS-CoV-2 viral copies this assay can detect is 138 copies/mL. A negative result does not preclude SARS-Cov-2 infection and should not be used as the sole basis for treatment or other patient management decisions. A negative result may occur with  improper specimen collection/handling, submission of specimen other than nasopharyngeal swab, presence of viral mutation(s) within the areas targeted by this assay, and inadequate number of viral copies(<138 copies/mL). A negative result must be combined with clinical observations, patient history, and epidemiological information. The expected result is Negative.  Fact Sheet for Patients:  EntrepreneurPulse.com.au  Fact Sheet for Healthcare Providers:  IncredibleEmployment.be  This test is no                          t yet approved or cleared by the [place] FDA  and  has been authorized for detection and/or diagnosis of SARS-CoV-2 by FDA under an Emergency Use Authorization (EUA). This EUA will remain  in effect (meaning this test can be used) for the duration of the COVID-19 declaration under Section 564(b)(1) of the Act, 21 U.S.C.section 360bbb-3(b)(1), unless the authorization is terminated  or revoked sooner.       Influenza A by PCR 03/11/2022 NEGATIVE  NEGATIVE Final   Influenza B by PCR 03/11/2022 NEGATIVE  NEGATIVE Final   Comment: (NOTE) The Xpert Xpress SARS-CoV-2/FLU/RSV plus assay is intended as an aid in the diagnosis of influenza from Nasopharyngeal swab specimens and should not be used as a sole basis for treatment. Nasal washings and aspirates are unacceptable for Xpert Xpress SARS-CoV-2/FLU/RSV testing.  Fact Sheet for Patients: EntrepreneurPulse.com.au  Fact Sheet for Healthcare Providers: IncredibleEmployment.be  This test is not yet approved or cleared by the [place] FDA and has been authorized for detection and/or diagnosis of SARS-CoV-2 by FDA under an Emergency Use Authorization (EUA). This EUA will remain in effect (meaning this test can be used) for the duration of the COVID-19 declaration under Section 564(b)(1) of the Act, 21 U.S.C. section 360bbb-3(b)(1), unless the authorization is terminated or revoked.  Performed at [facility], [address], [place], [place] [address]  Opiates 03/11/2022 POSITIVE (A)  NONE DETECTED Final   Cocaine 03/11/2022 NONE DETECTED  NONE DETECTED Final   Benzodiazepines 03/11/2022 NONE DETECTED  NONE DETECTED Final   Amphetamines 03/11/2022 NONE DETECTED  NONE DETECTED Final   Tetrahydrocannabinol 03/11/2022 NONE DETECTED  NONE DETECTED Final   Barbiturates 03/11/2022 NONE DETECTED  NONE DETECTED Final   Comment: (NOTE) DRUG SCREEN FOR MEDICAL PURPOSES ONLY.  IF CONFIRMATION IS NEEDED FOR ANY PURPOSE, NOTIFY  LAB WITHIN 5 DAYS.  LOWEST DETECTABLE LIMITS FOR URINE DRUG SCREEN Drug Class                     Cutoff (ng/mL) Amphetamine and metabolites    1000 Barbiturate and metabolites    200 Benzodiazepine                 237 Tricyclics and metabolites     300 Opiates and metabolites        300 Cocaine and metabolites        300 THC                            50 Performed at [facility], [address] [place], [place] [address]   Admission on 03/10/2022, Discharged on 03/11/2022  Component Date Value Ref Range Status   WBC 03/10/2022 4.3  4.0 - 10.5 K/uL Final   RBC 03/10/2022 4.47  4.22 - 5.81 MIL/uL Final   Hemoglobin 03/10/2022 14.5  13.0 - 17.0 g/dL Final   HCT 03/10/2022 40.0  39.0 - 52.0 % Final   MCV 03/10/2022 89.5  80.0 - 100.0 fL Final   MCH 03/10/2022 32.4  26.0 - 34.0 pg Final   MCHC 03/10/2022 36.3 (H)  30.0 - 36.0 g/dL Final   RDW 03/10/2022 13.7  11.5 - 15.5 % Final   Platelets 03/10/2022 135 (L)  150 - 400 K/uL Final   nRBC 03/10/2022 0.0  0.0 - 0.2 % Final   Neutrophils Relative % 03/10/2022 31  % Final   Neutro Abs 03/10/2022 1.3 (L)  1.7 - 7.7 K/uL Final   Lymphocytes Relative 03/10/2022 52  % Final   Lymphs Abs 03/10/2022 2.2  0.7 - 4.0 K/uL Final   Monocytes Relative 03/10/2022 15  % Final   Monocytes Absolute 03/10/2022 0.7  0.1 - 1.0 K/uL Final   Eosinophils Relative 03/10/2022 1  % Final   Eosinophils Absolute 03/10/2022 0.1  0.0 - 0.5 K/uL Final   Basophils Relative 03/10/2022 1  % Final   Basophils Absolute 03/10/2022 0.0  0.0 - 0.1 K/uL Final   Immature Granulocytes 03/10/2022 0  % Final   Abs Immature Granulocytes 03/10/2022 0.01  0.00 - 0.07 K/uL Final   Performed at [facility], [place] [address], [place], [place] [address]   Sodium 03/10/2022 136  135 - 145 mmol/L Final   Potassium 03/10/2022 3.5  3.5 - 5.1 mmol/L Final   Chloride 03/10/2022 98  98 - 111 mmol/L Final   CO2 03/10/2022 24  22 - 32 mmol/L Final   Glucose,  Bld 03/10/2022 115 (H)  70 - 99 mg/dL Final   Glucose reference range applies only to samples taken after fasting for at least 8 hours.   BUN 03/10/2022 7  6 - 20 mg/dL Final   Creatinine, Ser 03/10/2022 1.12  0.61 - 1.24 mg/dL Final   Calcium 03/10/2022 8.8 (L)  8.9 - 10.3 mg/dL Final  Total Protein 03/10/2022 7.1  6.5 - 8.1 g/dL Final   Albumin 03/10/2022 3.9  3.5 - 5.0 g/dL Final   AST 03/10/2022 86 (H)  15 - 41 U/L Final   ALT 03/10/2022 48 (H)  0 - 44 U/L Final   Alkaline Phosphatase 03/10/2022 101  38 - 126 U/L Final   Total Bilirubin 03/10/2022 1.0  0.3 - 1.2 mg/dL Final   GFR, Estimated 03/10/2022 >60  >60 mL/min Final   Comment: (NOTE) Calculated using the CKD-EPI Creatinine Equation (2021)    Anion gap 03/10/2022 14  5 - 15 Final   Performed at [facility], [place] [address] [place], [place] [address]   Lipase 03/10/2022 30  11 - 51 U/L Final   Performed at [facility] [address] [place], [place] [address]   Acetaminophen (Tylenol), Serum 03/10/2022 <10 (L)  10 - 30 ug/mL Final   Comment: (NOTE) Therapeutic concentrations vary significantly. A range of 10-30 ug/mL  may be an effective concentration for many patients. However, some  are best treated at concentrations outside of this range. Acetaminophen concentrations >150 ug/mL at 4 hours after ingestion  and >50 ug/mL at 12 hours after ingestion are often associated with  toxic reactions.  Performed at [facility], [place] [address], [place], [place] [address]    Salicylate Lvl 69/67/8938 <7.0 (L)  7.0 - 30.0 mg/dL Final   Performed at [facility] [address], [place], [place] [address]   Alcohol, Ethyl (B) 03/10/2022 227 (H)  <10 mg/dL Final   Comment: (NOTE) Lowest detectable limit for serum alcohol is 10 mg/dL.  For medical purposes only. Performed at [facility], [place] [address] [place], [place] [address]   Admission on 03/03/2022, Discharged on 03/04/2022  Component  Date Value Ref Range Status   Sodium 03/03/2022 137  135 - 145 mmol/L Final   Potassium 03/03/2022 4.1  3.5 - 5.1 mmol/L Final   Chloride 03/03/2022 103  98 - 111 mmol/L Final   CO2 03/03/2022 26  22 - 32 mmol/L Final   Glucose, Bld 03/03/2022 99  70 - 99 mg/dL Final   Glucose reference range applies only to samples taken after fasting for at least 8 hours.   BUN 03/03/2022 11  6 - 20 mg/dL Final   Creatinine, Ser 03/03/2022 0.88  0.61 - 1.24 mg/dL Final   Calcium 03/03/2022 8.6 (L)  8.9 - 10.3 mg/dL Final   Total Protein 03/03/2022 7.1  6.5 - 8.1 g/dL Final   Albumin 03/03/2022 3.8  3.5 - 5.0 g/dL Final   AST 03/03/2022 47 (H)  15 - 41 U/L Final   ALT 03/03/2022 36  0 - 44 U/L Final   Alkaline Phosphatase 03/03/2022 81  38 - 126 U/L Final   Total Bilirubin 03/03/2022 0.8  0.3 - 1.2 mg/dL Final   GFR, Estimated 03/03/2022 >60  >60 mL/min Final   Comment: (NOTE) Calculated using the CKD-EPI Creatinine Equation (2021)    Anion gap 03/03/2022 8  5 - 15 Final   Performed at [facility], [place] [address] [place], [place] [address]   Alcohol, Ethyl (B) 03/03/2022 280 (H)  <10 mg/dL Final   Comment: (NOTE) Lowest detectable limit for serum alcohol is 10 mg/dL.  For medical purposes only. Performed at [facility], [place] [address], [place], [place] [address]    Salicylate Lvl 36/14/4315 <7.0 (L)  7.0 - 30.0 mg/dL  Final   Performed at [facility], [place] [address] [place], [place] [address]   Acetaminophen (Tylenol), Serum 03/03/2022 <10 (L)  10 - 30 ug/mL Final   Comment: (NOTE) Therapeutic concentrations vary significantly. A range of 10-30 ug/mL  may be an effective concentration for many patients. However, some  are best treated at concentrations outside of this range. Acetaminophen concentrations >150 ug/mL at 4 hours after ingestion  and >50 ug/mL at 12 hours after ingestion are often associated with  toxic  reactions.  Performed at [facility], [place] [address], [place], [place] [address]    WBC 03/03/2022 4.8  4.0 - 10.5 K/uL Final   RBC 03/03/2022 4.39  4.22 - 5.81 MIL/uL Final   Hemoglobin 03/03/2022 14.0  13.0 - 17.0 g/dL Final   HCT 03/03/2022 40.0  39.0 - 52.0 % Final   MCV 03/03/2022 91.1  80.0 - 100.0 fL Final   MCH 03/03/2022 31.9  26.0 - 34.0 pg Final   MCHC 03/03/2022 35.0  30.0 - 36.0 g/dL Final   RDW 03/03/2022 13.2  11.5 - 15.5 % Final   Platelets 03/03/2022 152  150 - 400 K/uL Final   nRBC 03/03/2022 0.0  0.0 - 0.2 % Final   Performed at [facility], [place] [address], [place], [place] [address]   Opiates 03/03/2022 NONE DETECTED  NONE DETECTED Final   Cocaine 03/03/2022 NONE DETECTED  NONE DETECTED Final   Benzodiazepines 03/03/2022 NONE DETECTED  NONE DETECTED Final   Amphetamines 03/03/2022 NONE DETECTED  NONE DETECTED Final   Tetrahydrocannabinol 03/03/2022 NONE DETECTED  NONE DETECTED Final   Barbiturates 03/03/2022 NONE DETECTED  NONE DETECTED Final   Comment: (NOTE) DRUG SCREEN FOR MEDICAL PURPOSES ONLY.  IF CONFIRMATION IS NEEDED FOR ANY PURPOSE, NOTIFY LAB WITHIN 5 DAYS.  LOWEST DETECTABLE LIMITS FOR URINE DRUG SCREEN Drug Class                     Cutoff (ng/mL) Amphetamine and metabolites    1000 Barbiturate and metabolites    200 Benzodiazepine                 496 Tricyclics and metabolites     300 Opiates and metabolites        300 Cocaine and metabolites        300 THC                            50 Performed at [facility], [place] [address], [place], [place] [address]    SARS Coronavirus 2 by RT PCR 03/03/2022 NEGATIVE  NEGATIVE Final   Comment: (NOTE) SARS-CoV-2 target nucleic acids are NOT DETECTED.  The SARS-CoV-2 RNA is generally detectable in upper and lower respiratory specimens during the acute phase of infection. The lowest concentration of SARS-CoV-2 viral copies this assay can  detect is 250 copies / mL. A negative result does not preclude SARS-CoV-2 infection and should not be used as the sole basis for treatment or other patient management decisions.  A negative result may occur with improper specimen collection / handling, submission of specimen other than nasopharyngeal swab, presence of viral mutation(s) within the areas targeted by this assay, and inadequate number of viral copies (<250 copies / mL). A negative result must be combined with clinical observations, patient history, and epidemiological information.  Fact Sheet for Patients:   https://www.patel.info/  Fact Sheet for  Healthcare Providers: https://hall.com/  This test is not yet approved or                           cleared by the [place] and has been authorized for detection and/or diagnosis of SARS-CoV-2 by FDA under an Emergency Use Authorization (EUA).  This EUA will remain in effect (meaning this test can be used) for the duration of the COVID-19 declaration under Section 564(b)(1) of the Act, 21 U.S.C. section 360bbb-3(b)(1), unless the authorization is terminated or revoked sooner.  Performed at [facility] [place] [address], [place], [place] [address]    Lipase 03/03/2022 31  11 - 51 U/L Final   Performed at [facility], [place] [address] [place], [place] [address]  Admission on 01/12/2022, Discharged on 01/12/2022  Component Date Value Ref Range Status   WBC 01/12/2022 3.1 (L)  4.0 - 10.5 K/uL Final   RBC 01/12/2022 4.77  4.22 - 5.81 MIL/uL Final   Hemoglobin 01/12/2022 15.0  13.0 - 17.0 g/dL Final   HCT 01/12/2022 43.3  39.0 - 52.0 % Final   MCV 01/12/2022 90.8  80.0 - 100.0 fL Final   MCH 01/12/2022 31.4  26.0 - 34.0 pg Final   MCHC 01/12/2022 34.6  30.0 - 36.0 g/dL Final   RDW 01/12/2022 12.7  11.5 - 15.5 % Final   Platelets 01/12/2022 107 (L)  150 - 400 K/uL Final   Comment: Immature  Platelet Fraction may be clinically indicated, consider ordering this additional test [id number] CONSISTENT WITH PREVIOUS RESULT REPEATED TO VERIFY    nRBC 01/12/2022 0.0  0.0 - 0.2 % Final   Neutrophils Relative % 01/12/2022 40  % Final   Neutro Abs 01/12/2022 1.2 (L)  1.7 - 7.7 K/uL Final   Lymphocytes Relative 01/12/2022 47  % Final   Lymphs Abs 01/12/2022 1.4  0.7 - 4.0 K/uL Final   Monocytes Relative 01/12/2022 11  % Final   Monocytes Absolute 01/12/2022 0.4  0.1 - 1.0 K/uL Final   Eosinophils Relative 01/12/2022 1  % Final   Eosinophils Absolute 01/12/2022 0.0  0.0 - 0.5 K/uL Final   Basophils Relative 01/12/2022 1  % Final   Basophils Absolute 01/12/2022 0.0  0.0 - 0.1 K/uL Final   Immature Granulocytes 01/12/2022 0  % Final   Abs Immature Granulocytes 01/12/2022 0.00  0.00 - 0.07 K/uL Final   Performed at [facility], [place] [address], [place], [place] [address]   Sodium 01/12/2022 136  135 - 145 mmol/L Final   Potassium 01/12/2022 3.2 (L)  3.5 - 5.1 mmol/L Final   Chloride 01/12/2022 104  98 - 111 mmol/L Final   CO2 01/12/2022 25  22 - 32 mmol/L Final   Glucose, Bld 01/12/2022 96  70 - 99 mg/dL Final   Glucose reference range applies only to samples taken after fasting for at least 8 hours.   BUN 01/12/2022 5 (L)  6 - 20 mg/dL Final   Creatinine, Ser 01/12/2022 0.99  0.61 - 1.24 mg/dL Final   Calcium 01/12/2022 8.3 (L)  8.9 - 10.3 mg/dL Final   Total Protein 01/12/2022 6.4 (L)  6.5 - 8.1 g/dL Final   Albumin 01/12/2022 3.6  3.5 - 5.0 g/dL Final   AST 01/12/2022 66 (H)  15 - 41 U/L Final   ALT 01/12/2022 32  0 - 44 U/L Final   Alkaline Phosphatase 01/12/2022 71  38 -  126 U/L Final   Total Bilirubin 01/12/2022 1.0  0.3 - 1.2 mg/dL Final   GFR, Estimated 01/12/2022 >60  >60 mL/min Final   Comment: (NOTE) Calculated using the CKD-EPI Creatinine Equation (2021)    Anion gap 01/12/2022 7  5 - 15 Final   Performed at [facility], [place]  [address], [place], [place] [address]   Lipase 01/12/2022 25  11 - 51 U/L Final   Performed at [facility], [place] [address], [place], [place] [address]   Color, Urine 01/12/2022 STRAW (A)  YELLOW Final   APPearance 01/12/2022 CLEAR  CLEAR Final   Specific Gravity, Urine 01/12/2022 1.020  1.005 - 1.030 Final   pH 01/12/2022 7.0  5.0 - 8.0 Final   Glucose, UA 01/12/2022 NEGATIVE  NEGATIVE mg/dL Final   Hgb urine dipstick 01/12/2022 MODERATE (A)  NEGATIVE Final   Bilirubin Urine 01/12/2022 NEGATIVE  NEGATIVE Final   Ketones, ur 01/12/2022 5 (A)  NEGATIVE mg/dL Final   Protein, ur 01/12/2022 NEGATIVE  NEGATIVE mg/dL Final   Nitrite 01/12/2022 NEGATIVE  NEGATIVE Final   Leukocytes,Ua 01/12/2022 NEGATIVE  NEGATIVE Final   RBC / HPF 01/12/2022 0-5  0 - 5 RBC/hpf Final   WBC, UA 01/12/2022 0-5  0 - 5 WBC/hpf Final   Bacteria, UA 01/12/2022 NONE SEEN  NONE SEEN Final   Performed at [facility], [place] [address], [place], [place] [address]   Magnesium 01/12/2022 1.5 (L)  1.7 - 2.4 mg/dL Final   Performed at [facility] [address] [place], [place] [address]   Prothrombin Time 01/12/2022 12.6  11.4 - 15.2 seconds Final   INR 01/12/2022 1.0  0.8 - 1.2 Final   Comment: (NOTE) INR goal varies based on device and disease states. Performed at [facility], [place] [address] [place], [place] [address]     Blood Alcohol level:  Lab Results  Component Value Date   ETH <10 07/01/2022   ETH 251 (H) 44/06/270    Metabolic Disorder Labs: Lab Results  Component Value Date   HGBA1C 5.8 (H) 07/01/2022   MPG 119.76 07/01/2022   No results found for: "PROLACTIN" Lab Results  Component Value Date   CHOL 182 07/01/2022   TRIG 67 07/01/2022   HDL 75 07/01/2022   CHOLHDL 2.4 07/01/2022   VLDL 13 07/01/2022   LDLCALC 94 07/01/2022    Therapeutic Lab Levels: No results found for: "LITHIUM" No results found for:  "VALPROATE" No results found for: "CBMZ"  Physical Findings   PHQ2-9    Flowsheet Row ED from 07/01/2022 in [facility]  PHQ-2 Total Score 6  PHQ-9 Total Score 20      [place] ED from 07/01/2022 in [facility] ED from 06/29/2022 in [facility] Emergency Department at [facility] ED from 05/18/2022 in [facility] Emergency Department at [place] CATEGORY High Risk High Risk Low Risk        Musculoskeletal  Strength & Muscle Tone: within normal limits Gait & Station: unsteady Patient leans: N/A  Psychiatric Specialty Exam  Presentation  General Appearance:  Appropriate for Environment; Casual  Eye Contact: Other (comment) (Blind)  Speech: Clear and Coherent; Normal Rate  Speech Volume: Normal  Handedness: Right   Mood and Affect  Mood: Dysphoric  Affect: Congruent   Thought Process  Thought Processes: Coherent; Goal Directed  Descriptions of Associations:Intact  Orientation:Full (  Time, Place and Person)  Thought Content:WDL  Diagnosis of Schizophrenia or Schizoaffective disorder in past: No    Hallucinations:Hallucinations: None  Ideas of Reference:None  Suicidal Thoughts:Suicidal Thoughts: No  Homicidal Thoughts:Homicidal Thoughts: No   Sensorium  Memory: Immediate Fair; Recent Fair  Judgment: Good  Insight: Good   Executive Functions  Concentration: Good  Attention Span: Good  Recall: Good  Fund of Knowledge: Good  Language: Good   Psychomotor Activity  Psychomotor Activity:Psychomotor Activity: Normal   Assets  Assets: Communication Skills; Desire for Improvement; Financial Resources/Insurance; Housing; Resilience; Social Support   Sleep  Sleep:Sleep: Good   No data recorded  Physical Exam  Physical Exam Vitals and nursing note reviewed.  Constitutional:      General: He is not in acute distress.     Appearance: Normal appearance. He is normal weight. He is not ill-appearing or toxic-appearing.  HENT:     Head: Normocephalic and atraumatic.  Pulmonary:     Effort: Pulmonary effort is normal.  Neurological:     Mental Status: He is alert.    Review of Systems  Constitutional:  Positive for diaphoresis and malaise/fatigue.  Respiratory:  Negative for cough and shortness of breath.   Cardiovascular:  Negative for chest pain.  Gastrointestinal:  Positive for diarrhea and nausea. Negative for abdominal pain, constipation and vomiting.  Neurological:  Positive for dizziness, tremors, weakness and headaches.  Psychiatric/Behavioral:  Negative for depression, hallucinations and suicidal ideas. The patient is not nervous/anxious.    Blood pressure (!) 140/100, pulse 67, temperature 98.4 F (36.9 C), temperature source Oral, resp. rate 18, SpO2 97 %. There is no height or weight on file to calculate BMI.  Treatment Plan Summary: Daily contact with patient to assess and evaluate symptoms and progress in treatment and Medication management  Marteze R. Sires is a 57 yr old male who presented to [facility] on 1/23 with SI with a plan and requesting Detox from EtOH, he was admitted to [facility] on 1/25.  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   [place] is still having some significant withdrawal symptoms.  Today his blood pressure was elevated and he was more dizziness and unsteadiness.  Given his diarrhea there is risk for dehydration so he was given Gatorade and his dizziness did improve. After recheck of his blood pressure it did show reduction so most likely due to withdrawal so will continue to monitor.    Withdrawal: -Continue CIWA, last score '@CIWA'$ @ -Continue Ativan taper to end 1/29. -Continue Ativan 1 mg q6 PRN CIWA>10 -Continue Gabapentin 300 mg TID -Continue Imodium 2-4 mg PRN diarrhea -Continue  Zofran-ODT 4 mg q6 PRN nausea -Continue Thiamine 100 mg daily for nutritional supplementation -Continue Multivitamin daily for nutritional supplementation   -Continue PRN's: Tylenol, Maalox, Atarax, Milk of Magnesia, Trazodone    Dispo: Interview for CD-IOP on Tuesday   Thuan Tippett S Remy Voiles, MD 07/03/2022 12:12 PM

## 2022-07-03 NOTE — ED Notes (Signed)
Pt was given breakfast in his room due to him feeling dizzy

## 2022-07-04 DIAGNOSIS — F1010 Alcohol abuse, uncomplicated: Secondary | ICD-10-CM | POA: Diagnosis not present

## 2022-07-04 MED ORDER — NAPHAZOLINE-GLYCERIN 0.012-0.25 % OP SOLN: 1.0000 [drp] | Freq: Four times a day (QID) | OPHTHALMIC | Status: DC | PRN

## 2022-07-04 MED ORDER — LORAZEPAM 0.5 MG PO TABS
0.5000 mg | ORAL_TABLET | Freq: Once | ORAL | Status: AC
  Administered 2022-07-05: 0.5 mg via ORAL
  Filled 2022-07-04: qty 1

## 2022-07-04 MED ORDER — GABAPENTIN 100 MG PO CAPS
ORAL_CAPSULE | ORAL | Status: AC
  Administered 2022-07-04: 200 mg via ORAL
  Filled 2022-07-04: qty 2

## 2022-07-04 MED ORDER — GABAPENTIN 100 MG PO CAPS
200.0000 mg | ORAL_CAPSULE | Freq: Three times a day (TID) | ORAL | Status: DC
  Administered 2022-07-04 – 2022-07-05 (×3): 200 mg via ORAL
  Filled 2022-07-04 (×3): qty 2

## 2022-07-04 MED ORDER — LORAZEPAM 1 MG PO TABS
1.0000 mg | ORAL_TABLET | Freq: Once | ORAL | Status: AC
  Administered 2022-07-04: 1 mg via ORAL

## 2022-07-04 NOTE — ED Provider Notes (Signed)
Behavioral Health Progress Note  Date and Time: 07/04/2022 10:33 AM Name: Austin [name] MRN:  [id number]  Subjective:   Austin [name] is a 57 yr old male who presented to [facility] on 1/23 with SI with a plan and requesting Detox from EtOH, he was admitted to [facility] on 1/25.  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  He reports that he is continuing to have withdrawal issues.  He reports that he is having weakness, shakes, dizziness, and headache.  He does report that his diarrhea has stopped.  He reports he is having some cravings still but they are improving.  Discussed with him the reports he made to nursing about hallucinations.  He reports that he is not sure whether they are hallucinations or more like dreams.  He reports that he will think he is somewhere but then realized he is not where he thinks he is.  He attributes this to the gabapentin.  Discussed reducing the gabapentin to see if this improves his symptoms and he was agreeable to it.  He is still wanting to do the CD-IOP interview on Tuesday.  He reports no SI or HI.  He reports his sleep is good.  He reports his appetite is good.  He reports no other concerns at present.  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 Works at [employer].  Additional Social History:     Pain Medications: See PTA medication list Prescriptions: See PTA medication list Over the Counter: See PTA medication list History of alcohol / drug use?: Yes Longest period of sobriety (when/how long): none reported Negative Consequences of Use: Financial, Personal relationships Withdrawal Symptoms: Change in blood pressure, Diarrhea, Nausea / Vomiting, Sweats, Tremors, Other (Comment), None (headaches) Name of Substance 1: alcohol 1 - Age of First Use: unknown 1 - Amount (size/oz): fifth 1 - Frequency: daily 1 - Duration: drinking a fifth daily since 2022/06/12 1 - Last Use / Amount: 1 glass vodka and ginger ale 1 - Method of Aquiring: purchases from stores 1- Route of Use: oral                  Sleep: Good  Appetite:  Good  Current Medications:  Current Facility-Administered Medications  Medication Dose Route Frequency Provider Last Rate Last Admin   acetaminophen (TYLENOL) tablet 650 mg  650 mg Oral Q6H PRN Tharon Aquas, NP   650 mg at 07/03/22 2050   alum & mag hydroxide-simeth (MAALOX/MYLANTA) 200-200-20 MG/5ML suspension 30 mL  30 mL Oral Q4H PRN Tharon Aquas, NP       gabapentin (NEURONTIN) capsule 200 mg  200 mg Oral TID Briant Cedar, MD   200 mg at 07/04/22 2297   hydrOXYzine (ATARAX) tablet 25 mg  25 mg Oral Q6H PRN Tharon Aquas, NP   25 mg at 07/03/22 1306   loperamide (IMODIUM) capsule 2-4 mg  2-4 mg  Oral PRN Tharon Aquas, NP       Derrill Memo ON 07/05/2022] LORazepam (ATIVAN) tablet 0.5 mg  0.5 mg Oral Once Briant Cedar, MD       LORazepam (ATIVAN) tablet 1 mg  1 mg Oral Q6H PRN Tharon Aquas, NP       magnesium hydroxide (MILK OF MAGNESIA) suspension 30 mL  30 mL Oral Daily PRN Tharon Aquas, NP       multivitamin with minerals tablet 1 tablet  1 tablet Oral Daily Tharon Aquas, NP   1 tablet at 07/04/22 0928   ondansetron (ZOFRAN-ODT) disintegrating tablet 4 mg  4 mg Oral Q6H PRN Tharon Aquas,  NP   4 mg at 07/03/22 2050   thiamine (VITAMIN B1) tablet 100 mg  100 mg Oral Daily Tharon Aquas, NP   100 mg at 07/04/22 5638   traZODone (DESYREL) tablet 50 mg  50 mg Oral QHS PRN Tharon Aquas, NP   50 mg at 07/03/22 2108   Current Outpatient Medications  Medication Sig Dispense Refill   acetaminophen (TYLENOL) 500 MG tablet Take 1,000 mg by mouth every 6 (six) hours as needed for headache (pain).     dicyclomine (BENTYL) 20 MG tablet Take 1 tablet (20 mg total) by mouth 2 (two) times daily as needed for up to 20 doses for spasms. 20 tablet 0   LEXAPRO 5 MG tablet Take 5 mg by mouth every morning.     loperamide (IMODIUM) 2 MG capsule Take 2 mg by mouth 4 (four) times daily as needed for diarrhea or loose stools.     mirtazapine (REMERON) 7.5 MG tablet Take 7.5 mg by mouth at bedtime.     ondansetron (ZOFRAN) 8 MG tablet Take 8 mg by mouth 3 (three) times daily as needed for nausea or vomiting.     potassium chloride (KLOR-CON) 10 MEQ tablet Take 10 mEq by mouth 2 (two) times daily.      Labs  Lab Results:  Admission on 07/01/2022  Component Date Value Ref Range Status   SARS Coronavirus 2 by RT PCR 07/01/2022 NEGATIVE  NEGATIVE Final   Comment: (NOTE) SARS-CoV-2 target nucleic acids are NOT DETECTED.  The SARS-CoV-2 RNA is generally detectable in upper respiratory specimens during the acute phase of infection. The lowest concentration of SARS-CoV-2 viral copies this assay can detect is 138 copies/mL. A negative result does not preclude SARS-Cov-2 infection and should not be used as the sole basis for treatment or other patient management decisions. A negative result may occur with  improper specimen collection/handling, submission of specimen other than nasopharyngeal swab, presence of viral mutation(s) within the areas targeted by this assay, and inadequate number of viral copies(<138 copies/mL). A negative result must be combined with clinical observations, patient  history, and epidemiological information. The expected result is Negative.  Fact Sheet for Patients:  EntrepreneurPulse.com.au  Fact Sheet for Healthcare Providers:  IncredibleEmployment.be  This test is no                          t yet approved or cleared by the [place] FDA and  has been authorized for detection and/or diagnosis of SARS-CoV-2 by FDA under an Emergency Use Authorization (EUA). This EUA will remain  in effect (meaning this test can be used) for the duration of the COVID-19 declaration under Section 564(b)(1) of the Act, 21 U.S.C.section 360bbb-3(b)(1), unless the authorization is terminated  or revoked sooner.       Influenza A by PCR 07/01/2022 NEGATIVE  NEGATIVE Final   Influenza B by PCR 07/01/2022 NEGATIVE  NEGATIVE Final   Comment: (NOTE) The Xpert Xpress SARS-CoV-2/FLU/RSV plus assay is intended as an aid in the diagnosis of influenza from Nasopharyngeal swab specimens and should not be used as a sole basis for treatment. Nasal washings and aspirates are unacceptable for Xpert Xpress SARS-CoV-2/FLU/RSV testing.  Fact Sheet for Patients: EntrepreneurPulse.com.au  Fact Sheet for Healthcare Providers: IncredibleEmployment.be  This test is not yet approved or cleared by the [place] FDA and has been authorized for detection and/or diagnosis of SARS-CoV-2 by FDA under an Emergency Use Authorization (EUA). This EUA will remain in effect (meaning this test can be used) for the duration of the COVID-19 declaration under Section 564(b)(1) of the Act, 21 U.S.C. section 360bbb-3(b)(1), unless the authorization is terminated or revoked.     Resp Syncytial Virus by PCR 07/01/2022 NEGATIVE  NEGATIVE Final   Comment: (NOTE) Fact Sheet for Patients: EntrepreneurPulse.com.au  Fact Sheet for Healthcare Providers: IncredibleEmployment.be  This  test is not yet approved or cleared by the [place] FDA and has been authorized for detection and/or diagnosis of SARS-CoV-2 by FDA under an Emergency Use Authorization (EUA). This EUA will remain in effect (meaning this test can be used) for the duration of the COVID-19 declaration under Section 564(b)(1) of the Act, 21 U.S.C. section 360bbb-3(b)(1), unless the authorization is terminated or revoked.  Performed at [facility], [place] [address] [place], [place] [address]    WBC 07/01/2022 3.2 (L)  4.0 - 10.5 K/uL Final   RBC 07/01/2022 4.32  4.22 - 5.81 MIL/uL Final   Hemoglobin 07/01/2022 14.1  13.0 - 17.0 g/dL Final   HCT 07/01/2022 39.6  39.0 - 52.0 % Final   MCV 07/01/2022 91.7  80.0 - 100.0 fL Final   MCH 07/01/2022 32.6  26.0 - 34.0 pg Final   MCHC 07/01/2022 35.6  30.0 - 36.0 g/dL Final   RDW 07/01/2022 12.8  11.5 - 15.5 % Final   Platelets 07/01/2022 103 (L)  150 - 400 K/uL Final   REPEATED TO VERIFY   nRBC 07/01/2022 0.0  0.0 - 0.2 % Final   Neutrophils Relative % 07/01/2022 38  % Final   Neutro Abs 07/01/2022 1.2 (L)  1.7 - 7.7 K/uL Final   Lymphocytes Relative 07/01/2022 52  % Final   Lymphs Abs 07/01/2022 1.7  0.7 - 4.0 K/uL Final   Monocytes Relative 07/01/2022 9  % Final   Monocytes Absolute 07/01/2022 0.3  0.1 - 1.0 K/uL Final   Eosinophils Relative 07/01/2022 0  % Final   Eosinophils Absolute 07/01/2022 0.0  0.0 - 0.5 K/uL Final   Basophils Relative 07/01/2022 1  % Final   Basophils Absolute 07/01/2022 0.0  0.0 - 0.1 K/uL Final   Immature Granulocytes 07/01/2022 0  % Final   Abs Immature Granulocytes 07/01/2022 0.00  0.00 - 0.07 K/uL Final   Performed at [facility], [place] [address] [place], [place] [address]   Sodium 07/01/2022 137  135 - 145 mmol/L Final   Potassium 07/01/2022 3.8  3.5 - 5.1 mmol/L Final   Chloride 07/01/2022 99  98 - 111 mmol/L Final   CO2 07/01/2022 30  22 - 32 mmol/L Final   Glucose, Bld 07/01/2022 113 (H)  70 - 99 mg/dL  Final   Glucose reference range applies only to samples taken after  fasting for at least 8 hours.   BUN 07/01/2022 5 (L)  6 - 20 mg/dL Final   Creatinine, Ser 07/01/2022 1.06  0.61 - 1.24 mg/dL Final   Calcium 07/01/2022 9.2  8.9 - 10.3 mg/dL Final   Total Protein 07/01/2022 6.8  6.5 - 8.1 g/dL Final   Albumin 07/01/2022 4.0  3.5 - 5.0 g/dL Final   AST 07/01/2022 138 (H)  15 - 41 U/L Final   ALT 07/01/2022 143 (H)  0 - 44 U/L Final   Alkaline Phosphatase 07/01/2022 129 (H)  38 - 126 U/L Final   Total Bilirubin 07/01/2022 1.0  0.3 - 1.2 mg/dL Final   GFR, Estimated 07/01/2022 >60  >60 mL/min Final   Comment: (NOTE) Calculated using the CKD-EPI Creatinine Equation (2021)    Anion gap 07/01/2022 8  5 - 15 Final   Performed at [place] [address], [place], [place] [address]   Hgb A1c MFr Bld 07/01/2022 5.8 (H)  4.8 - 5.6 % Final   Comment: (NOTE) Pre diabetes:          5.7%-6.4%  Diabetes:              >6.4%  Glycemic control for   <7.0% adults with diabetes    Mean Plasma Glucose 07/01/2022 119.76  mg/dL Final   Performed at [facility], [place] [address] [place], [place] [address]   Alcohol, Ethyl (B) 07/01/2022 <10  <10 mg/dL Final   Comment: (NOTE) Lowest detectable limit for serum alcohol is 10 mg/dL.  For medical purposes only. Performed at [facility], [place] [address] [place], [place] [address]    TSH 07/01/2022 2.439  0.350 - 4.500 uIU/mL Final   Comment: Performed by a 3rd Generation assay with a functional sensitivity of <=0.01 uIU/mL. Performed at [facility], [place] [address] [place], [place] [address]    POC Amphetamine UR 07/01/2022 None Detected  NONE DETECTED (Cut Off Level 1000 ng/mL) Final   POC Secobarbital (BAR) 07/01/2022 None Detected  NONE DETECTED (Cut Off Level 300 ng/mL) Final   POC Buprenorphine (BUP) 07/01/2022 None Detected  NONE DETECTED (Cut Off Level 10 ng/mL) Final   POC Oxazepam (BZO) 07/01/2022 Positive (A)   NONE DETECTED (Cut Off Level 300 ng/mL) Final   POC Cocaine UR 07/01/2022 None Detected  NONE DETECTED (Cut Off Level 300 ng/mL) Final   POC Methamphetamine UR 07/01/2022 None Detected  NONE DETECTED (Cut Off Level 1000 ng/mL) Final   POC Morphine 07/01/2022 None Detected  NONE DETECTED (Cut Off Level 300 ng/mL) Final   POC Methadone UR 07/01/2022 None Detected  NONE DETECTED (Cut Off Level 300 ng/mL) Final   POC Oxycodone UR 07/01/2022 None Detected  NONE DETECTED (Cut Off Level 100 ng/mL) Final   POC Marijuana UR 07/01/2022 None Detected  NONE DETECTED (Cut Off Level 50 ng/mL) Final   SARSCOV2ONAVIRUS 2 AG 07/01/2022 NEGATIVE  NEGATIVE Final   Comment: (NOTE) SARS-CoV-2 antigen NOT DETECTED.   Negative results are presumptive.  Negative results do not preclude SARS-CoV-2 infection and should not be used as the sole basis for treatment or other patient management decisions, including infection  control decisions, particularly in the presence of clinical signs and  symptoms consistent with COVID-19, or in those who have been in contact with the virus.  Negative results must be combined with clinical observations, patient history, and epidemiological information. The expected result is Negative.  Fact Sheet for Patients: HandmadeRecipes.com.cy  Fact Sheet for Healthcare Providers: FuneralLife.at  This test is not yet approved or cleared by the [place] FDA and  has been authorized for detection and/or diagnosis of SARS-CoV-2 by FDA under an Emergency Use Authorization (EUA).  This EUA will remain in effect (meaning this test can be used) for the duration of  the COV                          ID-19 declaration under Section 564(b)(1) of the Act, 21 U.S.C. section 360bbb-3(b)(1), unless the authorization is terminated or revoked sooner.     Cholesterol 07/01/2022 182  0 - 200 mg/dL Final   Triglycerides 07/01/2022 67  <150 mg/dL Final    HDL 07/01/2022 75  >40 mg/dL Final   Total CHOL/HDL Ratio 07/01/2022 2.4  RATIO Final   VLDL 07/01/2022 13  0 - 40 mg/dL Final   LDL Cholesterol 07/01/2022 94  0 - 99 mg/dL Final   Comment:        Total Cholesterol/HDL:CHD Risk Coronary Heart Disease Risk Table                     Men   Women  1/2 Average Risk   3.4   3.3  Average Risk       5.0   4.4  2 X Average Risk   9.6   7.1  3 X Average Risk  23.4   11.0        Use the calculated Patient Ratio above and the CHD Risk Table to determine the patient's CHD Risk.        ATP III CLASSIFICATION (LDL):  <100     mg/dL   Optimal  100-129  mg/dL   Near or Above                    Optimal  130-159  mg/dL   Borderline  160-189  mg/dL   High  >190     mg/dL   Very High Performed at [place] [address], [place], [place] [address]   Admission on 06/29/2022, Discharged on 06/30/2022  Component Date Value Ref Range Status   Sodium 06/29/2022 138  135 - 145 mmol/L Final   Potassium 06/29/2022 3.9  3.5 - 5.1 mmol/L Final   Chloride 06/29/2022 99  98 - 111 mmol/L Final   CO2 06/29/2022 28  22 - 32 mmol/L Final   Glucose, Bld 06/29/2022 81  70 - 99 mg/dL Final   Glucose reference range applies only to samples taken after fasting for at least 8 hours.   BUN 06/29/2022 9  6 - 20 mg/dL Final   Creatinine, Ser 06/29/2022 0.99  0.61 - 1.24 mg/dL Final   Calcium 06/29/2022 8.5 (L)  8.9 - 10.3 mg/dL Final   Total Protein 06/29/2022 6.7  6.5 - 8.1 g/dL Final   Albumin 06/29/2022 3.8  3.5 - 5.0 g/dL Final   AST 06/29/2022 207 (H)  15 - 41 U/L Final   ALT 06/29/2022 191 (H)  0 - 44 U/L Final   Alkaline Phosphatase 06/29/2022 138 (H)  38 - 126 U/L Final   Total Bilirubin 06/29/2022 0.4  0.3 - 1.2 mg/dL Final   GFR, Estimated 06/29/2022 >60  >60 mL/min Final   Comment: (NOTE) Calculated using the CKD-EPI Creatinine Equation (2021)    Anion gap 06/29/2022 11  5 - 15 Final   Performed at [facility] [place] [facility], [place] [address],  [place], [place] [address]   Alcohol, Ethyl (B) 06/29/2022 251 (H)  <10 mg/dL Final   Comment: (NOTE) Lowest detectable limit for serum alcohol is 10 mg/dL.  For medical purposes only. Performed at [facility], [place] [address] [place], [place] [address]    WBC 06/29/2022 3.2 (L)  4.0 - 10.5 K/uL Final   RBC 06/29/2022 4.33  4.22 - 5.81 MIL/uL Final   Hemoglobin 06/29/2022 14.2  13.0 - 17.0 g/dL Final   HCT 06/29/2022 39.4  39.0 - 52.0 % Final   MCV 06/29/2022 91.0  80.0 - 100.0 fL Final   MCH 06/29/2022 32.8  26.0 - 34.0 pg Final   MCHC 06/29/2022 36.0  30.0 - 36.0 g/dL Final   RDW 06/29/2022 13.2  11.5 - 15.5 % Final   Platelets 06/29/2022 125 (L)  150 - 400 K/uL Final   nRBC 06/29/2022 0.0  0.0 - 0.2 % Final   Neutrophils Relative % 06/29/2022 44  % Final   Neutro Abs 06/29/2022 1.4 (L)  1.7 - 7.7 K/uL Final   Lymphocytes Relative 06/29/2022 51  % Final   Lymphs Abs 06/29/2022 1.6  0.7 - 4.0 K/uL Final   Monocytes Relative 06/29/2022 4  % Final   Monocytes Absolute 06/29/2022 0.1  0.1 - 1.0 K/uL Final   Eosinophils Relative 06/29/2022 1  % Final   Eosinophils Absolute 06/29/2022 0.0  0.0 - 0.5 K/uL Final   Basophils Relative 06/29/2022 0  % Final   Basophils Absolute 06/29/2022 0.0  0.0 - 0.1 K/uL Final   WBC Morphology 06/29/2022 See Note   Final   ALLN   nRBC 06/29/2022 0  0 /100 WBC Final   Abs Immature Granulocytes 06/29/2022 0.00  0.00 - 0.07 K/uL Final   Performed at [facility], [place] [address], [place], [place] [address]   Acetaminophen (Tylenol), Serum 06/29/2022 <10 (L)  10 - 30 ug/mL Final   Comment: (NOTE) Therapeutic concentrations vary significantly. A range of 10-30 ug/mL  may be an effective concentration for many patients. However, some  are best treated at concentrations outside of this range. Acetaminophen concentrations >150 ug/mL at 4 hours after ingestion  and >50 ug/mL at 12 hours after ingestion are often associated with  toxic  reactions.  Performed at [facility], [place] [address], [place], [place] [address]    Salicylate Lvl 73/53/2992 <7.0 (L)  7.0 - 30.0 mg/dL Final   Performed at [place] [address] [place], [place] [address]   Troponin I (High Sensitivity) 06/29/2022 3  <18 ng/L Final   Comment: (NOTE) Elevated high sensitivity troponin I (hsTnI) values and significant  changes across serial measurements may suggest ACS but many other  chronic and acute conditions are known to elevate hsTnI results.  Refer to the "Links" section for chest pain algorithms and additional  guidance. Performed at [facility], [place] [address], [place], [place] [address]    Troponin I (High Sensitivity) 06/29/2022 3  <18 ng/L Final   Comment: (NOTE) Elevated high sensitivity troponin I (hsTnI) values and significant  changes across serial measurements may suggest ACS but many other  chronic and acute conditions are known to elevate hsTnI results.  Refer to the "Links" section for chest pain algorithms and additional  guidance. Performed at [facility], [place] [address], [place], [place] [address]   Admission on 05/18/2022, Discharged on 05/18/2022  Component Date Value Ref Range Status   Sodium 05/18/2022 134 (L)  135 - 145 mmol/L Final   Potassium 05/18/2022 3.3 (L)  3.5 - 5.1 mmol/L Final   Chloride 05/18/2022 97 (L)  98 - 111 mmol/L Final   CO2 05/18/2022 26  22 - 32 mmol/L Final   Glucose, Bld 05/18/2022 127 (H)  70 - 99 mg/dL Final   Glucose reference range applies only to samples taken after fasting for at least 8 hours.   BUN 05/18/2022 <5 (L)  6 - 20 mg/dL Final   Creatinine, Ser 05/18/2022 0.91  0.61 - 1.24 mg/dL Final   Calcium 05/18/2022 8.8 (L)  8.9 - 10.3 mg/dL Final   Total Protein 05/18/2022 6.7  6.5 - 8.1 g/dL Final   Albumin 05/18/2022 3.8  3.5 - 5.0 g/dL Final   AST 05/18/2022 637 (H)  15 - 41 U/L Final   ALT 05/18/2022 255 (H)  0 - 44 U/L Final   Alkaline  Phosphatase 05/18/2022 102  38 - 126 U/L Final   Total Bilirubin 05/18/2022 0.7  0.3 - 1.2 mg/dL Final   GFR, Estimated 05/18/2022 >60  >60 mL/min Final   Comment: (NOTE) Calculated using the CKD-EPI Creatinine Equation (2021)    Anion gap 05/18/2022 11  5 - 15 Final   Performed at [facility] [address], [place], [place] [address]   Opiates 05/18/2022 NONE DETECTED  NONE DETECTED Final   Cocaine 05/18/2022 NONE DETECTED  NONE DETECTED Final   Benzodiazepines 05/18/2022 NONE DETECTED  NONE DETECTED Final   Amphetamines 05/18/2022 NONE DETECTED  NONE DETECTED Final   Tetrahydrocannabinol 05/18/2022 POSITIVE (A)  NONE DETECTED Final   Barbiturates 05/18/2022 NONE DETECTED  NONE DETECTED Final   Comment: (NOTE) DRUG SCREEN FOR MEDICAL PURPOSES ONLY.  IF CONFIRMATION IS NEEDED FOR ANY PURPOSE, NOTIFY LAB WITHIN 5 DAYS.  LOWEST DETECTABLE LIMITS FOR URINE DRUG SCREEN Drug Class                     Cutoff (ng/mL) Amphetamine and metabolites    1000 Barbiturate and metabolites    200 Benzodiazepine                 200 Opiates and metabolites        300 Cocaine and metabolites        300 THC                            50 Performed at [facility], [place] [address] [place], [place] [address]    WBC 05/18/2022 1.7 (L)  4.0 - 10.5 K/uL Final   RBC 05/18/2022 4.81  4.22 - 5.81 MIL/uL Final   Hemoglobin 05/18/2022 15.8  13.0 - 17.0 g/dL Final   HCT 05/18/2022 42.8  39.0 - 52.0 % Final   MCV 05/18/2022 89.0  80.0 - 100.0 fL Final   MCH 05/18/2022 32.8  26.0 - 34.0 pg Final   MCHC 05/18/2022 36.9 (H)  30.0 - 36.0 g/dL Final   RDW 05/18/2022 12.5  11.5 - 15.5 % Final   Platelets 05/18/2022 112 (L)  150 - 400 K/uL Final   nRBC 05/18/2022 0.0  0.0 - 0.2 % Final   Neutrophils Relative % 05/18/2022 26  % Final   Neutro Abs 05/18/2022 0.4 (LL)  1.7 - 7.7 K/uL Final   Comment: REPEATED TO VERIFY THIS CRITICAL RESULT HAS VERIFIED AND BEEN  CALLED TO BRITTANY BECK,RN BY ZELDA  BEECH ON 12 12 2023 AT 1410, AND HAS BEEN READ BACK.     Lymphocytes Relative 05/18/2022 59  % Final   Lymphs Abs 05/18/2022 1.0  0.7 - 4.0 K/uL Final   Monocytes Relative 05/18/2022 13  % Final   Monocytes Absolute 05/18/2022 0.2  0.1 - 1.0 K/uL Final   Eosinophils Relative 05/18/2022 1  % Final   Eosinophils Absolute 05/18/2022 0.0  0.0 - 0.5 K/uL Final   Basophils Relative 05/18/2022 1  % Final   Basophils Absolute 05/18/2022 0.0  0.0 - 0.1 K/uL Final   WBC Morphology 05/18/2022 MORPHOLOGY UNREMARKABLE   Final   RBC Morphology 05/18/2022 MORPHOLOGY UNREMARKABLE   Final   Smear Review 05/18/2022 MORPHOLOGY UNREMARKABLE   Final   Immature Granulocytes 05/18/2022 0  % Final   Abs Immature Granulocytes 05/18/2022 0.00  0.00 - 0.07 K/uL Final   Performed at [facility], [place] [address], [place], [place] [address]   Alcohol, Ethyl (B) 05/18/2022 105 (H)  <10 mg/dL Final   Comment: (NOTE) Lowest detectable limit for serum alcohol is 10 mg/dL.  For medical purposes only. Performed at [facility], [place] [address], [place], [place] [address]   Admission on 05/08/2022, Discharged on 05/08/2022  Component Date Value Ref Range Status   Total hemoglobin 05/08/2022 13.5  12.0 - 16.0 g/dL Final   O2 Saturation 05/08/2022 56.7  % Final   Carboxyhemoglobin 05/08/2022 4.1 (H)  0.5 - 1.5 % Final   Methemoglobin 05/08/2022 0.7  0.0 - 1.5 % Final   Performed at [facility], [place] [address], [place], [place] [address]   WBC 05/08/2022 4.2  4.0 - 10.5 K/uL Final   RBC 05/08/2022 3.89 (L)  4.22 - 5.81 MIL/uL Final   Hemoglobin 05/08/2022 13.0  13.0 - 17.0 g/dL Final   HCT 05/08/2022 36.9 (L)  39.0 - 52.0 % Final   MCV 05/08/2022 94.9  80.0 - 100.0 fL Final   MCH 05/08/2022 33.4  26.0 - 34.0 pg Final   MCHC 05/08/2022 35.2  30.0 - 36.0 g/dL Final   RDW 05/08/2022 13.8  11.5 - 15.5 % Final   Platelets 05/08/2022 89 (L)  150 - 400 K/uL Final   Comment: Immature Platelet Fraction may  be clinically indicated, consider ordering this additional test [id number] REPEATED TO VERIFY PLATELET COUNT CONFIRMED BY SMEAR    nRBC 05/08/2022 0.0  0.0 - 0.2 % Final   Performed at [facility], [place] [address] [place], [place] [address]   Sodium 05/08/2022 140  135 - 145 mmol/L Final   Potassium 05/08/2022 3.8  3.5 - 5.1 mmol/L Final   Chloride 05/08/2022 103  98 - 111 mmol/L Final   CO2 05/08/2022 25  22 - 32 mmol/L Final   Glucose, Bld 05/08/2022 92  70 - 99 mg/dL Final   Glucose reference range applies only to samples taken after fasting for at least 8 hours.   BUN 05/08/2022 12  6 - 20 mg/dL Final   Creatinine, Ser 05/08/2022 1.00  0.61 - 1.24 mg/dL Final   Calcium 05/08/2022 8.9  8.9 - 10.3 mg/dL Final   Total Protein 05/08/2022 6.1 (L)  6.5 - 8.1 g/dL Final   Albumin 05/08/2022 3.7  3.5 - 5.0 g/dL Final   AST 05/08/2022 45 (H)  15 - 41 U/L Final   ALT 05/08/2022 35  0 - 44 U/L Final   Alkaline  Phosphatase 05/08/2022 75  38 - 126 U/L Final   Total Bilirubin 05/08/2022 0.7  0.3 - 1.2 mg/dL Final   GFR, Estimated 05/08/2022 >60  >60 mL/min Final   Comment: (NOTE) Calculated using the CKD-EPI Creatinine Equation (2021)    Anion gap 05/08/2022 12  5 - 15 Final   Performed at [facility] [address] [place], [place] [address]   SARS Coronavirus 2 by RT PCR 05/08/2022 NEGATIVE  NEGATIVE Final   Comment: (NOTE) SARS-CoV-2 target nucleic acids are NOT DETECTED.  The SARS-CoV-2 RNA is generally detectable in upper respiratory specimens during the acute phase of infection. The lowest concentration of SARS-CoV-2 viral copies this assay can detect is 138 copies/mL. A negative result does not preclude SARS-Cov-2 infection and should not be used as the sole basis for treatment or other patient management decisions. A negative result may occur with  improper specimen collection/handling, submission of specimen other than nasopharyngeal swab, presence of viral  mutation(s) within the areas targeted by this assay, and inadequate number of viral copies(<138 copies/mL). A negative result must be combined with clinical observations, patient history, and epidemiological information. The expected result is Negative.  Fact Sheet for Patients:  EntrepreneurPulse.com.au  Fact Sheet for Healthcare Providers:  IncredibleEmployment.be  This test is no                          t yet approved or cleared by the [place] FDA and  has been authorized for detection and/or diagnosis of SARS-CoV-2 by FDA under an Emergency Use Authorization (EUA). This EUA will remain  in effect (meaning this test can be used) for the duration of the COVID-19 declaration under Section 564(b)(1) of the Act, 21 U.S.C.section 360bbb-3(b)(1), unless the authorization is terminated  or revoked sooner.       Influenza A by PCR 05/08/2022 NEGATIVE  NEGATIVE Final   Influenza B by PCR 05/08/2022 NEGATIVE  NEGATIVE Final   Comment: (NOTE) The Xpert Xpress SARS-CoV-2/FLU/RSV plus assay is intended as an aid in the diagnosis of influenza from Nasopharyngeal swab specimens and should not be used as a sole basis for treatment. Nasal washings and aspirates are unacceptable for Xpert Xpress SARS-CoV-2/FLU/RSV testing.  Fact Sheet for Patients: EntrepreneurPulse.com.au  Fact Sheet for Healthcare Providers: IncredibleEmployment.be  This test is not yet approved or cleared by the [place] FDA and has been authorized for detection and/or diagnosis of SARS-CoV-2 by FDA under an Emergency Use Authorization (EUA). This EUA will remain in effect (meaning this test can be used) for the duration of the COVID-19 declaration under Section 564(b)(1) of the Act, 21 U.S.C. section 360bbb-3(b)(1), unless the authorization is terminated or revoked.  Performed at [facility], [place] [address] [place],  [place] [address]   Admission on 03/11/2022, Discharged on 03/12/2022  Component Date Value Ref Range Status   Sodium 03/11/2022 132 (L)  135 - 145 mmol/L Final   Potassium 03/11/2022 3.6  3.5 - 5.1 mmol/L Final   Chloride 03/11/2022 95 (L)  98 - 111 mmol/L Final   CO2 03/11/2022 24  22 - 32 mmol/L Final   Glucose, Bld 03/11/2022 119 (H)  70 - 99 mg/dL Final   Glucose reference range applies only to samples taken after fasting for at least 8 hours.   BUN 03/11/2022 6  6 - 20 mg/dL Final   Creatinine, Ser 03/11/2022 1.12  0.61 - 1.24 mg/dL Final   Calcium  03/11/2022 9.1  8.9 - 10.3 mg/dL Final   Total Protein 03/11/2022 6.9  6.5 - 8.1 g/dL Final   Albumin 03/11/2022 4.3  3.5 - 5.0 g/dL Final   AST 03/11/2022 72 (H)  15 - 41 U/L Final   ALT 03/11/2022 41  0 - 44 U/L Final   Alkaline Phosphatase 03/11/2022 102  38 - 126 U/L Final   Total Bilirubin 03/11/2022 0.8  0.3 - 1.2 mg/dL Final   GFR, Estimated 03/11/2022 >60  >60 mL/min Final   Comment: (NOTE) Calculated using the CKD-EPI Creatinine Equation (2021)    Anion gap 03/11/2022 13  5 - 15 Final   Performed at [facility], [place], [place], [place] [address]   WBC 03/11/2022 4.2  4.0 - 10.5 K/uL Final   RBC 03/11/2022 4.40  4.22 - 5.81 MIL/uL Final   Hemoglobin 03/11/2022 14.0  13.0 - 17.0 g/dL Final   HCT 03/11/2022 39.6  39.0 - 52.0 % Final   MCV 03/11/2022 90.0  80.0 - 100.0 fL Final   MCH 03/11/2022 31.8  26.0 - 34.0 pg Final   MCHC 03/11/2022 35.4  30.0 - 36.0 g/dL Final   RDW 03/11/2022 13.4  11.5 - 15.5 % Final   Platelets 03/11/2022 124 (L)  150 - 400 K/uL Final   nRBC 03/11/2022 0.0  0.0 - 0.2 % Final   Neutrophils Relative % 03/11/2022 45  % Final   Neutro Abs 03/11/2022 1.9  1.7 - 7.7 K/uL Final   Lymphocytes Relative 03/11/2022 38  % Final   Lymphs Abs 03/11/2022 1.6  0.7 - 4.0 K/uL Final   Monocytes Relative 03/11/2022 16  % Final   Monocytes Absolute 03/11/2022 0.7  0.1 - 1.0 K/uL Final    Eosinophils Relative 03/11/2022 1  % Final   Eosinophils Absolute 03/11/2022 0.0  0.0 - 0.5 K/uL Final   Basophils Relative 03/11/2022 0  % Final   Basophils Absolute 03/11/2022 0.0  0.0 - 0.1 K/uL Final   Immature Granulocytes 03/11/2022 0  % Final   Abs Immature Granulocytes 03/11/2022 0.00  0.00 - 0.07 K/uL Final   Performed at [facility], [address], [place], [place] [address]   Lipase 03/11/2022 15  11 - 51 U/L Final   Performed at [facility], [address], [place], [place] [address]   Color, Urine 03/11/2022 YELLOW  YELLOW Final   APPearance 03/11/2022 CLEAR  CLEAR Final   Specific Gravity, Urine 03/11/2022 1.028  1.005 - 1.030 Final   pH 03/11/2022 5.5  5.0 - 8.0 Final   Glucose, UA 03/11/2022 NEGATIVE  NEGATIVE mg/dL Final   Hgb urine dipstick 03/11/2022 LARGE (A)  NEGATIVE Final   Bilirubin Urine 03/11/2022 NEGATIVE  NEGATIVE Final   Ketones, ur 03/11/2022 NEGATIVE  NEGATIVE mg/dL Final   Protein, ur 03/11/2022 TRACE (A)  NEGATIVE mg/dL Final   Nitrite 03/11/2022 NEGATIVE  NEGATIVE Final   Leukocytes,Ua 03/11/2022 NEGATIVE  NEGATIVE Final   RBC / HPF 03/11/2022 0-5  0 - 5 RBC/hpf Final   WBC, UA 03/11/2022 0-5  0 - 5 WBC/hpf Final   Mucus 03/11/2022 PRESENT   Final   Hyaline Casts, UA 03/11/2022 PRESENT   Final   Performed at [facility], [address], [place], [place] [address]   Alcohol, Ethyl (B) 03/11/2022 63 (H)  <10 mg/dL Final   Comment: (NOTE) Lowest detectable limit for serum alcohol is 10 mg/dL.  For medical purposes only. Performed at [facility], [address] [place], [place] [address]    Salicylate Lvl 81/06/7508 <7.0 (L)  7.0 - 30.0 mg/dL Final   Performed at [facility], [place], [place] [address]   Acetaminophen (Tylenol), Serum 03/11/2022 <10 (L)  10 - 30 ug/mL Final   Comment: (NOTE) Therapeutic concentrations vary  significantly. A range of 10-30 ug/mL  may be an effective concentration for many patients. However, some  are best treated at concentrations outside of this range. Acetaminophen concentrations >150 ug/mL at 4 hours after ingestion  and >50 ug/mL at 12 hours after ingestion are often associated with  toxic reactions.  Performed at [facility], [place], [place] [address]    Troponin I (High Sensitivity) 03/11/2022 <2  <18 ng/L Final   Comment: (NOTE) Elevated high sensitivity troponin I (hsTnI) values and significant  changes across serial measurements may suggest ACS but many other  chronic and acute conditions are known to elevate hsTnI results.  Refer to the "Links" section for chest pain algorithms and additional  guidance. Performed at [facility], [address], [place], [place] [address]    SARS Coronavirus 2 by RT PCR 03/11/2022 NEGATIVE  NEGATIVE Final   Comment: (NOTE) SARS-CoV-2 target nucleic acids are NOT DETECTED.  The SARS-CoV-2 RNA is generally detectable in upper respiratory specimens during the acute phase of infection. The lowest concentration of SARS-CoV-2 viral copies this assay can detect is 138 copies/mL. A negative result does not preclude SARS-Cov-2 infection and should not be used as the sole basis for treatment or other patient management decisions. A negative result may occur with  improper specimen collection/handling, submission of specimen other than nasopharyngeal swab, presence of viral mutation(s) within the areas targeted by this assay, and inadequate number of viral copies(<138 copies/mL). A negative result must be combined with clinical observations, patient history, and epidemiological information. The expected result is Negative.  Fact Sheet for Patients:  EntrepreneurPulse.com.au  Fact Sheet for Healthcare Providers:   IncredibleEmployment.be  This test is no                          t yet approved or cleared by the [place] FDA and  has been authorized for detection and/or diagnosis of SARS-CoV-2 by FDA under an Emergency Use Authorization (EUA). This EUA will remain  in effect (meaning this test can be used) for the duration of the COVID-19 declaration under Section 564(b)(1) of the Act, 21 U.S.C.section 360bbb-3(b)(1), unless the authorization is terminated  or revoked sooner.       Influenza A by PCR 03/11/2022 NEGATIVE  NEGATIVE Final   Influenza B by PCR 03/11/2022 NEGATIVE  NEGATIVE Final   Comment: (NOTE) The Xpert Xpress SARS-CoV-2/FLU/RSV plus assay is intended as an aid in the diagnosis of influenza from Nasopharyngeal swab specimens and should not be used as a sole basis for treatment. Nasal washings and aspirates are unacceptable for Xpert Xpress SARS-CoV-2/FLU/RSV testing.  Fact Sheet for Patients: EntrepreneurPulse.com.au  Fact Sheet for Healthcare Providers: IncredibleEmployment.be  This test is not yet approved or cleared by the [place] FDA and has been authorized for detection and/or diagnosis of SARS-CoV-2 by FDA under an Emergency Use Authorization (EUA). This EUA will remain in effect (meaning this test can be used) for the duration of the COVID-19 declaration under Section 564(b)(1) of the Act, 21 U.S.C. section 360bbb-3(b)(1), unless the authorization is terminated or revoked.  Performed  at [facility], [address] [place], [place] [address]    Opiates 03/11/2022 POSITIVE (A)  NONE DETECTED Final   Cocaine 03/11/2022 NONE DETECTED  NONE DETECTED Final   Benzodiazepines 03/11/2022 NONE DETECTED  NONE DETECTED Final   Amphetamines 03/11/2022 NONE DETECTED  NONE DETECTED Final   Tetrahydrocannabinol 03/11/2022 NONE DETECTED  NONE DETECTED Final   Barbiturates 03/11/2022 NONE  DETECTED  NONE DETECTED Final   Comment: (NOTE) DRUG SCREEN FOR MEDICAL PURPOSES ONLY.  IF CONFIRMATION IS NEEDED FOR ANY PURPOSE, NOTIFY LAB WITHIN 5 DAYS.  LOWEST DETECTABLE LIMITS FOR URINE DRUG SCREEN Drug Class                     Cutoff (ng/mL) Amphetamine and metabolites    1000 Barbiturate and metabolites    200 Benzodiazepine                 725 Tricyclics and metabolites     300 Opiates and metabolites        300 Cocaine and metabolites        300 THC                            50 Performed at [facility], [address], [place], [place] [address]   Admission on 03/10/2022, Discharged on 03/11/2022  Component Date Value Ref Range Status   WBC 03/10/2022 4.3  4.0 - 10.5 K/uL Final   RBC 03/10/2022 4.47  4.22 - 5.81 MIL/uL Final   Hemoglobin 03/10/2022 14.5  13.0 - 17.0 g/dL Final   HCT 03/10/2022 40.0  39.0 - 52.0 % Final   MCV 03/10/2022 89.5  80.0 - 100.0 fL Final   MCH 03/10/2022 32.4  26.0 - 34.0 pg Final   MCHC 03/10/2022 36.3 (H)  30.0 - 36.0 g/dL Final   RDW 03/10/2022 13.7  11.5 - 15.5 % Final   Platelets 03/10/2022 135 (L)  150 - 400 K/uL Final   nRBC 03/10/2022 0.0  0.0 - 0.2 % Final   Neutrophils Relative % 03/10/2022 31  % Final   Neutro Abs 03/10/2022 1.3 (L)  1.7 - 7.7 K/uL Final   Lymphocytes Relative 03/10/2022 52  % Final   Lymphs Abs 03/10/2022 2.2  0.7 - 4.0 K/uL Final   Monocytes Relative 03/10/2022 15  % Final   Monocytes Absolute 03/10/2022 0.7  0.1 - 1.0 K/uL Final   Eosinophils Relative 03/10/2022 1  % Final   Eosinophils Absolute 03/10/2022 0.1  0.0 - 0.5 K/uL Final   Basophils Relative 03/10/2022 1  % Final   Basophils Absolute 03/10/2022 0.0  0.0 - 0.1 K/uL Final   Immature Granulocytes 03/10/2022 0  % Final   Abs Immature Granulocytes 03/10/2022 0.01  0.00 - 0.07 K/uL Final   Performed at [facility], [place] [address] [place], [place] [address]   Sodium 03/10/2022 136  135 - 145 mmol/L Final   Potassium  03/10/2022 3.5  3.5 - 5.1 mmol/L Final   Chloride 03/10/2022 98  98 - 111 mmol/L Final   CO2 03/10/2022 24  22 - 32 mmol/L Final   Glucose, Bld 03/10/2022 115 (H)  70 - 99 mg/dL Final   Glucose reference range applies only to samples taken after fasting for at least 8 hours.   BUN 03/10/2022 7  6 - 20 mg/dL Final   Creatinine, Ser 03/10/2022 1.12  0.61 - 1.24 mg/dL Final  Calcium 03/10/2022 8.8 (L)  8.9 - 10.3 mg/dL Final   Total Protein 03/10/2022 7.1  6.5 - 8.1 g/dL Final   Albumin 03/10/2022 3.9  3.5 - 5.0 g/dL Final   AST 03/10/2022 86 (H)  15 - 41 U/L Final   ALT 03/10/2022 48 (H)  0 - 44 U/L Final   Alkaline Phosphatase 03/10/2022 101  38 - 126 U/L Final   Total Bilirubin 03/10/2022 1.0  0.3 - 1.2 mg/dL Final   GFR, Estimated 03/10/2022 >60  >60 mL/min Final   Comment: (NOTE) Calculated using the CKD-EPI Creatinine Equation (2021)    Anion gap 03/10/2022 14  5 - 15 Final   Performed at [facility], [place] [address] [place], [place] [address]   Lipase 03/10/2022 30  11 - 51 U/L Final   Performed at [facility] [address] [place], [place] [address]   Acetaminophen (Tylenol), Serum 03/10/2022 <10 (L)  10 - 30 ug/mL Final   Comment: (NOTE) Therapeutic concentrations vary significantly. A range of 10-30 ug/mL  may be an effective concentration for many patients. However, some  are best treated at concentrations outside of this range. Acetaminophen concentrations >150 ug/mL at 4 hours after ingestion  and >50 ug/mL at 12 hours after ingestion are often associated with  toxic reactions.  Performed at [facility], [place] [address], [place], [place] [address]    Salicylate Lvl 77/82/4235 <7.0 (L)  7.0 - 30.0 mg/dL Final   Performed at [facility] [address] [place], [place] [address]   Alcohol, Ethyl (B) 03/10/2022 227 (H)  <10 mg/dL Final   Comment: (NOTE) Lowest detectable limit for serum alcohol is 10 mg/dL.  For medical purposes  only. Performed at [facility], [place] [address], [place], [place] [address]   Admission on 03/03/2022, Discharged on 03/04/2022  Component Date Value Ref Range Status   Sodium 03/03/2022 137  135 - 145 mmol/L Final   Potassium 03/03/2022 4.1  3.5 - 5.1 mmol/L Final   Chloride 03/03/2022 103  98 - 111 mmol/L Final   CO2 03/03/2022 26  22 - 32 mmol/L Final   Glucose, Bld 03/03/2022 99  70 - 99 mg/dL Final   Glucose reference range applies only to samples taken after fasting for at least 8 hours.   BUN 03/03/2022 11  6 - 20 mg/dL Final   Creatinine, Ser 03/03/2022 0.88  0.61 - 1.24 mg/dL Final   Calcium 03/03/2022 8.6 (L)  8.9 - 10.3 mg/dL Final   Total Protein 03/03/2022 7.1  6.5 - 8.1 g/dL Final   Albumin 03/03/2022 3.8  3.5 - 5.0 g/dL Final   AST 03/03/2022 47 (H)  15 - 41 U/L Final   ALT 03/03/2022 36  0 - 44 U/L Final   Alkaline Phosphatase 03/03/2022 81  38 - 126 U/L Final   Total Bilirubin 03/03/2022 0.8  0.3 - 1.2 mg/dL Final   GFR, Estimated 03/03/2022 >60  >60 mL/min Final   Comment: (NOTE) Calculated using the CKD-EPI Creatinine Equation (2021)    Anion gap 03/03/2022 8  5 - 15 Final   Performed at [facility], [place] [address], [place], [place] [address]   Alcohol, Ethyl (B) 03/03/2022 280 (H)  <10 mg/dL Final   Comment: (NOTE) Lowest detectable limit for serum alcohol is 10 mg/dL.  For medical purposes only. Performed at [facility], [place] [address] [place], [place] [address]  Salicylate Lvl 36/14/4315 <7.0 (L)  7.0 - 30.0 mg/dL Final   Performed at [facility] [address] [place], [place] [address]   Acetaminophen (Tylenol), Serum 03/03/2022 <10 (L)  10 - 30 ug/mL Final   Comment: (NOTE) Therapeutic concentrations vary significantly. A range of 10-30 ug/mL  may be an effective concentration for many patients. However, some  are best treated at concentrations outside of this range. Acetaminophen  concentrations >150 ug/mL at 4 hours after ingestion  and >50 ug/mL at 12 hours after ingestion are often associated with  toxic reactions.  Performed at [facility], [place] [address], [place], [place] [address]    WBC 03/03/2022 4.8  4.0 - 10.5 K/uL Final   RBC 03/03/2022 4.39  4.22 - 5.81 MIL/uL Final   Hemoglobin 03/03/2022 14.0  13.0 - 17.0 g/dL Final   HCT 03/03/2022 40.0  39.0 - 52.0 % Final   MCV 03/03/2022 91.1  80.0 - 100.0 fL Final   MCH 03/03/2022 31.9  26.0 - 34.0 pg Final   MCHC 03/03/2022 35.0  30.0 - 36.0 g/dL Final   RDW 03/03/2022 13.2  11.5 - 15.5 % Final   Platelets 03/03/2022 152  150 - 400 K/uL Final   nRBC 03/03/2022 0.0  0.0 - 0.2 % Final   Performed at [facility], [place] [address] [place], [place] [address]   Opiates 03/03/2022 NONE DETECTED  NONE DETECTED Final   Cocaine 03/03/2022 NONE DETECTED  NONE DETECTED Final   Benzodiazepines 03/03/2022 NONE DETECTED  NONE DETECTED Final   Amphetamines 03/03/2022 NONE DETECTED  NONE DETECTED Final   Tetrahydrocannabinol 03/03/2022 NONE DETECTED  NONE DETECTED Final   Barbiturates 03/03/2022 NONE DETECTED  NONE DETECTED Final   Comment: (NOTE) DRUG SCREEN FOR MEDICAL PURPOSES ONLY.  IF CONFIRMATION IS NEEDED FOR ANY PURPOSE, NOTIFY LAB WITHIN 5 DAYS.  LOWEST DETECTABLE LIMITS FOR URINE DRUG SCREEN Drug Class                     Cutoff (ng/mL) Amphetamine and metabolites    1000 Barbiturate and metabolites    200 Benzodiazepine                 712 Tricyclics and metabolites     300 Opiates and metabolites        300 Cocaine and metabolites        300 THC                            50 Performed at [facility], [place] [address], [place], [place] [address]    SARS Coronavirus 2 by RT PCR 03/03/2022 NEGATIVE  NEGATIVE Final   Comment: (NOTE) SARS-CoV-2 target nucleic acids are NOT DETECTED.  The SARS-CoV-2 RNA is generally detectable in upper and  lower respiratory specimens during the acute phase of infection. The lowest concentration of SARS-CoV-2 viral copies this assay can detect is 250 copies / mL. A negative result does not preclude SARS-CoV-2 infection and should not be used as the sole basis for treatment or other patient management decisions.  A negative result may occur with improper specimen collection / handling, submission of specimen other than nasopharyngeal swab, presence of viral mutation(s) within the areas targeted by this assay, and inadequate number of viral copies (<250 copies / mL). A negative result must be combined with clinical observations, patient history, and epidemiological information.  Fact  Sheet for Patients:   https://www.patel.info/  Fact Sheet for Healthcare Providers: https://hall.com/  This test is not yet approved or                           cleared by the [place] FDA and has been authorized for detection and/or diagnosis of SARS-CoV-2 by FDA under an Emergency Use Authorization (EUA).  This EUA will remain in effect (meaning this test can be used) for the duration of the COVID-19 declaration under Section 564(b)(1) of the Act, 21 U.S.C. section 360bbb-3(b)(1), unless the authorization is terminated or revoked sooner.  Performed at [facility], [place] [address], [place], [place] [address]    Lipase 03/03/2022 31  11 - 51 U/L Final   Performed at [facility], [place] [address], [place], [place] [address]  Admission on 01/12/2022, Discharged on 01/12/2022  Component Date Value Ref Range Status   WBC 01/12/2022 3.1 (L)  4.0 - 10.5 K/uL Final   RBC 01/12/2022 4.77  4.22 - 5.81 MIL/uL Final   Hemoglobin 01/12/2022 15.0  13.0 - 17.0 g/dL Final   HCT 01/12/2022 43.3  39.0 - 52.0 % Final   MCV 01/12/2022 90.8  80.0 - 100.0 fL Final   MCH 01/12/2022 31.4  26.0 - 34.0 pg Final   MCHC 01/12/2022 34.6  30.0 -  36.0 g/dL Final   RDW 01/12/2022 12.7  11.5 - 15.5 % Final   Platelets 01/12/2022 107 (L)  150 - 400 K/uL Final   Comment: Immature Platelet Fraction may be clinically indicated, consider ordering this additional test [id number] CONSISTENT WITH PREVIOUS RESULT REPEATED TO VERIFY    nRBC 01/12/2022 0.0  0.0 - 0.2 % Final   Neutrophils Relative % 01/12/2022 40  % Final   Neutro Abs 01/12/2022 1.2 (L)  1.7 - 7.7 K/uL Final   Lymphocytes Relative 01/12/2022 47  % Final   Lymphs Abs 01/12/2022 1.4  0.7 - 4.0 K/uL Final   Monocytes Relative 01/12/2022 11  % Final   Monocytes Absolute 01/12/2022 0.4  0.1 - 1.0 K/uL Final   Eosinophils Relative 01/12/2022 1  % Final   Eosinophils Absolute 01/12/2022 0.0  0.0 - 0.5 K/uL Final   Basophils Relative 01/12/2022 1  % Final   Basophils Absolute 01/12/2022 0.0  0.0 - 0.1 K/uL Final   Immature Granulocytes 01/12/2022 0  % Final   Abs Immature Granulocytes 01/12/2022 0.00  0.00 - 0.07 K/uL Final   Performed at [facility], [place] [address] [place], [place] [address]   Sodium 01/12/2022 136  135 - 145 mmol/L Final   Potassium 01/12/2022 3.2 (L)  3.5 - 5.1 mmol/L Final   Chloride 01/12/2022 104  98 - 111 mmol/L Final   CO2 01/12/2022 25  22 - 32 mmol/L Final   Glucose, Bld 01/12/2022 96  70 - 99 mg/dL Final   Glucose reference range applies only to samples taken after fasting for at least 8 hours.   BUN 01/12/2022 5 (L)  6 - 20 mg/dL Final   Creatinine, Ser 01/12/2022 0.99  0.61 - 1.24 mg/dL Final   Calcium 01/12/2022 8.3 (L)  8.9 - 10.3 mg/dL Final   Total Protein 01/12/2022 6.4 (L)  6.5 - 8.1 g/dL Final   Albumin 01/12/2022 3.6  3.5 - 5.0 g/dL Final   AST 01/12/2022 66 (H)  15 - 41 U/L Final   ALT 01/12/2022 32  0 - 44  U/L Final   Alkaline Phosphatase 01/12/2022 71  38 - 126 U/L Final   Total Bilirubin 01/12/2022 1.0  0.3 - 1.2 mg/dL Final   GFR, Estimated 01/12/2022 >60  >60 mL/min Final   Comment: (NOTE) Calculated using the  CKD-EPI Creatinine Equation (2021)    Anion gap 01/12/2022 7  5 - 15 Final   Performed at [facility], [place] [address], [place], [place] [address]   Lipase 01/12/2022 25  11 - 51 U/L Final   Performed at [facility], [place] [address], [place], [place] [address]   Color, Urine 01/12/2022 STRAW (A)  YELLOW Final   APPearance 01/12/2022 CLEAR  CLEAR Final   Specific Gravity, Urine 01/12/2022 1.020  1.005 - 1.030 Final   pH 01/12/2022 7.0  5.0 - 8.0 Final   Glucose, UA 01/12/2022 NEGATIVE  NEGATIVE mg/dL Final   Hgb urine dipstick 01/12/2022 MODERATE (A)  NEGATIVE Final   Bilirubin Urine 01/12/2022 NEGATIVE  NEGATIVE Final   Ketones, ur 01/12/2022 5 (A)  NEGATIVE mg/dL Final   Protein, ur 01/12/2022 NEGATIVE  NEGATIVE mg/dL Final   Nitrite 01/12/2022 NEGATIVE  NEGATIVE Final   Leukocytes,Ua 01/12/2022 NEGATIVE  NEGATIVE Final   RBC / HPF 01/12/2022 0-5  0 - 5 RBC/hpf Final   WBC, UA 01/12/2022 0-5  0 - 5 WBC/hpf Final   Bacteria, UA 01/12/2022 NONE SEEN  NONE SEEN Final   Performed at [facility], [place] [address], [place], [place] [address]   Magnesium 01/12/2022 1.5 (L)  1.7 - 2.4 mg/dL Final   Performed at [place] [address] [place], [place] [address]   Prothrombin Time 01/12/2022 12.6  11.4 - 15.2 seconds Final   INR 01/12/2022 1.0  0.8 - 1.2 Final   Comment: (NOTE) INR goal varies based on device and disease states. Performed at [facility], [place] [address], [place], [place] [address]     Blood Alcohol level:  Lab Results  Component Value Date   ETH <10 07/01/2022   ETH 251 (H) 62/70/3500    Metabolic Disorder Labs: Lab Results  Component Value Date   HGBA1C 5.8 (H) 07/01/2022   MPG 119.76 07/01/2022   No results found for: "PROLACTIN" Lab Results  Component Value Date   CHOL 182 07/01/2022   TRIG 67 07/01/2022   HDL 75 07/01/2022   CHOLHDL 2.4 07/01/2022   VLDL  13 07/01/2022   LDLCALC 94 07/01/2022    Therapeutic Lab Levels: No results found for: "LITHIUM" No results found for: "VALPROATE" No results found for: "CBMZ"  Physical Findings   PHQ2-9    Flowsheet Row ED from 07/01/2022 in [facility]  PHQ-2 Total Score 3  PHQ-9 Total Score 14      [facility] ED from 07/01/2022 in [facility] ED from 06/29/2022 in [facility] Emergency Department at [facility] ED from 05/18/2022 in [facility] Emergency Department at [place] CATEGORY High Risk High Risk Low Risk        Musculoskeletal  Strength & Muscle Tone: within normal limits Gait & Station: unsteady Patient leans: N/A  Psychiatric Specialty Exam  Presentation  General Appearance:  Appropriate for Environment; Casual  Eye Contact: Other (comment) (Blind)  Speech: Clear and Coherent; Normal Rate  Speech Volume: Normal  Handedness: Right   Mood and Affect  Mood: Dysphoric  Affect: Congruent   Thought Process  Thought Processes: Coherent; Goal Directed  Descriptions of Associations:Intact  Orientation:Full (Time, Place and Person)  Thought Content:WDL; Logical  Diagnosis of Schizophrenia or Schizoaffective disorder in past: No    Hallucinations:Hallucinations: Other (comment) (reports possible dreams/hallucinations)  Ideas of Reference:None  Suicidal Thoughts:Suicidal Thoughts: No  Homicidal Thoughts:Homicidal Thoughts: No   Sensorium  Memory: Immediate Fair; Recent Fair  Judgment: Good  Insight: Good   Executive Functions  Concentration: Good  Attention Span: Good  Recall: Good  Fund of Knowledge: Good  Language: Good   Psychomotor Activity  Psychomotor Activity:Psychomotor Activity: Normal   Assets  Assets: Communication Skills; Desire for Improvement; Financial Resources/Insurance; Housing; Social Support;  Resilience   Sleep  Sleep:Sleep: Good   No data recorded  Physical Exam  Physical Exam Vitals and nursing note reviewed.  Constitutional:      General: He is not in acute distress.    Appearance: Normal appearance. He is normal weight. He is not ill-appearing or toxic-appearing.  HENT:     Head: Normocephalic and atraumatic.  Pulmonary:     Effort: Pulmonary effort is normal.  Neurological:     Mental Status: He is alert.    Review of Systems  Respiratory:  Negative for cough and shortness of breath.   Cardiovascular:  Negative for chest pain.  Gastrointestinal:  Negative for abdominal pain, constipation, diarrhea, nausea and vomiting.  Neurological:  Positive for dizziness, tremors, weakness and headaches.  Psychiatric/Behavioral:  Positive for hallucinations. Negative for depression and suicidal ideas. The patient is not nervous/anxious.    Blood pressure 132/84, pulse 64, temperature 97.8 F (36.6 C), temperature source Oral, resp. rate 16, SpO2 100 %. There is no height or weight on file to calculate BMI.  Treatment Plan Summary: Daily contact with patient to assess and evaluate symptoms and progress in treatment and Medication management  Clarke R. Merolla is a 57 yr old male who presented to [facility] on 1/23 with SI with a plan and requesting Detox from EtOH, he was admitted to [facility] on 1/25.  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   Aqeel is continuing to have withdrawal symptoms.  He continues to be very unsteady on his feet and so given his blindness we will continue with the one-to-one.  He is hallucinations but states they may be dreams and he also is not aware thinks he is which could be dissociation due to the Ativan.  We will change his taper to a quick taper of a single dose of 1 mg today and 0.5 mg tomorrow for 1 dose.  We will also decrease his gabapentin to 200 mg 3 times  daily.  If withdrawal symptoms increase we will adjust the Ativan taper as needed.  We will continue to monitor.   Withdrawal: -Continue CIWA, last score= 2  @ 0532  1/28 -Change Ativan taper to 1 mg today and 0.5 mg tomorrow for one dose. -Continue Ativan 1 mg q6 PRN CIWA>10 -Decrease Gabapentin to 200 mg TID -Continue Imodium 2-4 mg PRN diarrhea -Continue Zofran-ODT 4 mg q6 PRN nausea -Continue Thiamine 100 mg daily for nutritional supplementation -Continue Multivitamin daily for nutritional supplementation   -Continue PRN's: Tylenol, Maalox, Atarax, Milk of Magnesia, Trazodone    Dispo: Interview for CD-IOP on Tuesday   Briant Cedar, MD 07/04/2022 10:34 AM

## 2022-07-04 NOTE — ED Notes (Signed)
Patient remains on 1:1 due to blindness, weakness of legs, dizzyness and not being oriented to unit.  Patient has guide cane which he uses however still does not know his way around unit and requires assistance to prevent falls or injury.  Patient is calm and pleasant on approach however tends to be minimally verbal.  Patient continues to report withdrawal symptoms and is on withdrawal protocol.  Due to patient reporting hearing voices and being dizzy and weak the taper has been reduced and shortened by MD.  Patient CIWA score = 2.  Patient will be reassessed by MD later today to identify the necessity of continuing 1:1.  Will continue to monitor and provide a safe environment.

## 2022-07-04 NOTE — ED Notes (Signed)
Pt speaking with staff in pleasant tones.  Recently finished call with cousin.  Pt stated "My legs don't hurt like they did.  I feel much better. "  Pt asked about a potential discharge for tomorrow.

## 2022-07-04 NOTE — ED Notes (Signed)
Patient is resting quietly with eyes closed. No S/S of distress, will continue to monitor for safety.

## 2022-07-04 NOTE — ED Notes (Signed)
Patient states that he is "sad". He was encouraged to go into the day room to be with other patients, he refused stated "I want peace and quiet". Patient is with the one on one MHT at this time. Will continue to monitor for safety.

## 2022-07-04 NOTE — ED Notes (Signed)
Pt resting at this hour. No apparent distress. RR even and unlabored. Monitored for safety.

## 2022-07-04 NOTE — ED Notes (Signed)
Snacks given 

## 2022-07-04 NOTE — ED Notes (Signed)
Patient told 1:1 that he was having negative thoughts and wanted something for sleep.  RN went to patient and strongly encouraged and then helped him out of bed.  RN educated patient that the way to change negative thoughts by changing environment.  He was assisted to the dayroom for dinner.  He was initially unhappy with Probation officer for making him get up but once his food was set up and he began to engage with male peers he stated he was glad RN made him get out of bed.  He is presently still sitting in day room eating and socializing. Patient reports he wants to be discharged tomorrow.  Advised him to inform MD in the A.M.  will continue to monitor and provide safety and support while on unit.

## 2022-07-04 NOTE — ED Notes (Signed)
Pt in lobby with sitter

## 2022-07-04 NOTE — Progress Notes (Signed)
Pt told what medications he is taking this evening. Pt states that gabapentin causes him to hallucinate. "I didn't know that this was what I was taking. My doctor  prescribed it to me and when I started to have hallucinations, I stopped it myself." Pt did take medication tonight. Pt encouraged to tell provider in the morning about the hallucinations. Pt did report some auditory hallucinations this shift.

## 2022-07-05 DIAGNOSIS — F1010 Alcohol abuse, uncomplicated: Secondary | ICD-10-CM | POA: Diagnosis not present

## 2022-07-05 NOTE — Progress Notes (Signed)
Pt stated he didn't sleep well last night. Thinking about going home. Pt denied withdrawal symptoms.

## 2022-07-05 NOTE — ED Notes (Signed)
Pt resting at this hour. No apparent distress. RR even and unlabored. Monitored for safety.

## 2022-07-05 NOTE — ED Notes (Signed)
"  Pt denies SI, plan, and intention.  Suicide safety plan completed, reviewed with this RN, given to the patient, and a copy in the chart."

## 2022-07-05 NOTE — ED Notes (Signed)
Patient are in group with [facility] they discussed different things dealing with addiction, and the patients got a chance to talk about their own experiences and problems, there were discussions about ways to work thru triggers, and negative behaviors. They talked about their life stories. It was as very good meeting and everyone except for the Micro patient attended. There was participation by all of the patients, They really seemed to enjoy the meeting.

## 2022-07-05 NOTE — ED Notes (Signed)
Patient A&O x 4, ambulatory. Patient discharged in no acute distress. Patient denied SI/HI, A/VH upon discharge. Patient verbalized understanding of all discharge instructions explained by staff, to include follow up appointments, RX's and safety plan. Patient reported mood 10/10.  Pt belongings returned to patient from locker # 29 intact. Patient escorted to lobby via staff for transport to destination. Safety maintained.   

## 2022-07-05 NOTE — Discharge Planning (Addendum)
LCSW received update from MD that patient is going to be discharging on today. Per MD, Sister/Cousin will be transporting the patient at 1:00pm. Aftercare follow up appointment needed for CDIOP. LCSW contacted Steele Sizer to follow up regarding next available appt. Per Garrot, next available appt is 07/08/2022 at 12:00pm. Information to be provided to the patient for his review and follow up. No other needs were reported at this time. LCSW to sign off.   Attempted to contact Renella Cunas [phone] to inform of appt, however received no answer. Voice message left and no patient identifying information was shared. Appt time scheduled was provided. No other needs to report.  Austin Conn, LCSW Clinical Social Worker [place] [facility] Ph: [phone]

## 2022-07-05 NOTE — ED Provider Notes (Signed)
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  The patient presented to the emergency department withdrawing from alcohol and reported suicidal thoughts with a plan to overdose.  This was in the context of his girlfriend dying earlier this month.  He was sent to the [facility] where he was assessed, denies suicidal thoughts given that he is receiving treatment.  He was admitted to the facility based crisis.    Stay Summary:  He was experiencing moderate alcohol withdrawal and was put on an Ativan taper with gabapentin.  The patient has expressed a desire to follow-up outpatient once he was done with detox. Patient requested d/c before getting CDIOP appointment. Will call him tomorrow with information.    Total Time spent with patient: 20 min  Past Psychiatric History: as above Past Medical History: per H and P Family History: none Family Psychiatric History: none Social History: per H and P Tobacco Cessation:  A prescription for an FDA-approved tobacco cessation medication provided at discharge   Current Medications:  Current Facility-Administered Medications  Medication Dose Route Frequency Provider Last Rate Last Admin   acetaminophen (TYLENOL) tablet 650 mg  650 mg Oral Q6H PRN Tharon Aquas, NP   650 mg at 07/03/22 2050   alum & mag hydroxide-simeth (MAALOX/MYLANTA) 200-200-20 MG/5ML suspension 30 mL  30 mL Oral Q4H PRN Tharon Aquas, NP       gabapentin (NEURONTIN) capsule 200 mg  200 mg Oral TID Briant Cedar, MD   200  mg at 07/05/22 0459   magnesium hydroxide (MILK OF MAGNESIA) suspension 30 mL  30 mL Oral Daily PRN Tharon Aquas, NP       multivitamin with minerals tablet 1 tablet  1 tablet Oral Daily Tharon Aquas, NP   1 tablet at 07/05/22 9774   naphazoline-glycerin (CLEAR EYES REDNESS) ophth solution 1-2 drop  1-2 drop Both Eyes QID PRN Briant Cedar, MD       thiamine (VITAMIN B1) tablet 100 mg  100 mg Oral Daily Tharon Aquas, NP   100 mg at 07/05/22 0905   traZODone (DESYREL) tablet 50 mg  50 mg Oral QHS PRN Tharon Aquas, NP   50 mg at 07/04/22 2146   Current Outpatient Medications  Medication Sig Dispense Refill   acetaminophen (TYLENOL) 500 MG tablet Take 1,000 mg by mouth every 6 (six) hours as needed for headache (pain).     dicyclomine (BENTYL) 20 MG tablet Take 1 tablet (20 mg total) by mouth 2 (two) times daily as needed for up to 20 doses for spasms. 20 tablet 0   LEXAPRO 5 MG tablet Take 5 mg by mouth every morning.     loperamide (IMODIUM) 2 MG capsule Take 2 mg by mouth 4 (four) times daily as needed for diarrhea or loose stools.     mirtazapine (REMERON) 7.5 MG tablet Take 7.5 mg by mouth at bedtime.     ondansetron (ZOFRAN) 8 MG tablet Take 8 mg by mouth 3 (three) times daily as needed for nausea or vomiting.  potassium chloride (KLOR-CON) 10 MEQ tablet Take 10 mEq by mouth 2 (two) times daily.      PTA Medications:  Facility Ordered Medications  Medication   [COMPLETED] thiamine (VITAMIN B1) injection 100 mg   thiamine (VITAMIN B1) tablet 100 mg   multivitamin with minerals tablet 1 tablet   [EXPIRED] LORazepam (ATIVAN) tablet 1 mg   [EXPIRED] hydrOXYzine (ATARAX) tablet 25 mg   [EXPIRED] loperamide (IMODIUM) capsule 2-4 mg   [EXPIRED] ondansetron (ZOFRAN-ODT) disintegrating tablet 4 mg   [EXPIRED] LORazepam (ATIVAN) tablet 1 mg   Followed by   [COMPLETED] LORazepam (ATIVAN) tablet 1 mg   acetaminophen (TYLENOL) tablet 650 mg   alum & mag  hydroxide-simeth (MAALOX/MYLANTA) 200-200-20 MG/5ML suspension 30 mL   magnesium hydroxide (MILK OF MAGNESIA) suspension 30 mL   traZODone (DESYREL) tablet 50 mg   gabapentin (NEURONTIN) capsule 200 mg   [COMPLETED] LORazepam (ATIVAN) tablet 1 mg   [COMPLETED] LORazepam (ATIVAN) tablet 0.5 mg   naphazoline-glycerin (CLEAR EYES REDNESS) ophth solution 1-2 drop   PTA Medications  Medication Sig   acetaminophen (TYLENOL) 500 MG tablet Take 1,000 mg by mouth every 6 (six) hours as needed for headache (pain).   dicyclomine (BENTYL) 20 MG tablet Take 1 tablet (20 mg total) by mouth 2 (two) times daily as needed for up to 20 doses for spasms.   mirtazapine (REMERON) 7.5 MG tablet Take 7.5 mg by mouth at bedtime.   LEXAPRO 5 MG tablet Take 5 mg by mouth every morning.   loperamide (IMODIUM) 2 MG capsule Take 2 mg by mouth 4 (four) times daily as needed for diarrhea or loose stools.   ondansetron (ZOFRAN) 8 MG tablet Take 8 mg by mouth 3 (three) times daily as needed for nausea or vomiting.   potassium chloride (KLOR-CON) 10 MEQ tablet Take 10 mEq by mouth 2 (two) times daily.       07/05/2022   10:14 AM 07/04/2022   10:23 AM 07/01/2022    6:44 PM  Depression screen PHQ 2/9  Decreased Interest 0 2 3  Down, Depressed, Hopeless 0 1 3  PHQ - 2 Score 0 3 6  Altered sleeping 0 3 3  Tired, decreased energy 0 3 3  Change in appetite 0 1 3  Feeling bad or failure about yourself  0 1 1  Trouble concentrating 0 1 1  Moving slowly or fidgety/restless 0 2 2  Suicidal thoughts 0 0 1  PHQ-9 Score 0 14 20  Difficult doing work/chores Not difficult at all Somewhat difficult Very difficult    Flowsheet Row ED from 07/01/2022 in [facility] ED from 06/29/2022 in [facility] Emergency Department at [facility] ED from 05/18/2022 in [facility] Emergency Department at [place] CATEGORY High Risk High Risk Low Risk      Musculoskeletal  Strength  & Muscle Tone: within normal limits Gait & Station: normal Patient leans: N/A  Psychiatric Specialty Exam: Physical Exam Constitutional:      Appearance: the patient is not toxic-appearing.  Pulmonary:     Effort: Pulmonary effort is normal.  Neurological:     General: No focal deficit present.     Mental Status: the patient is alert and oriented to person, place, and time.   Review of Systems  Respiratory:  Negative for shortness of breath.   Cardiovascular:  Negative for chest pain.  Gastrointestinal:  Negative for abdominal pain, constipation, diarrhea, nausea and vomiting.  Neurological:  Negative for headaches.      BP 136/81 (BP Location: Left Arm)   Pulse 65   Temp 98.4 F (36.9 C) (Oral)   Resp 14   SpO2 99%   General Appearance: Fairly Groomed  Eye Contact:  Good  Speech:  Clear and Coherent  Volume:  Normal  Mood:  Euthymic  Affect:  Congruent  Thought Process:  Coherent  Orientation:  Full (Time, Place, and Person)  Thought Content: Logical   Suicidal Thoughts:  No  Homicidal Thoughts:  No  Memory:  Immediate;   Good  Judgement:  fair  Insight:  fair  Psychomotor Activity:  Normal  Concentration:  Concentration: Good  Recall:  Good  Fund of Knowledge: Good  Language: Good  Akathisia:  No  Handed:    AIMS (if indicated): not done  Assets:  Communication Skills Desire for Improvement Financial Resources/Insurance Housing Leisure Time Physical Health  ADL's:  Intact  Cognition: WNL  Sleep:  Fair   Demographic Factors:  Low socioeconomic status   Loss Factors: NA   Historical Factors: NA   Risk Reduction Factors:   Positive social support, Positive therapeutic relationship, and Positive coping skills or problem solving skills   Continued Clinical Symptoms:  Alcohol/Substance Abuse/Dependencies   Cognitive Features That Contribute To Risk:  None     Suicide Risk:  Mild: Patient did not present with intentional self-harm behavior.   Has demonstrated good engagement in the treatment plan and future oriented thought process.  Good follow-up plan in place.   Plan Of Care/Follow-up recommendations:  Activity as tolerated. Diet as recommended by PCP. Keep all scheduled follow-up appointments as recommended.   Patient is instructed to take all prescribed medications as recommended. Report any side effects or adverse reactions to your outpatient psychiatrist. Patient is instructed to abstain from alcohol and illegal drugs while on prescription medications. In the event of worsening symptoms, patient is instructed to call the crisis hotline, 911, or go to the nearest emergency department for evaluation and treatment.   Prescriptions given at discharge. Patient agreeable to plan. Given opportunity to ask questions. Appears to feel comfortable with discharge.   Patient is also instructed prior to discharge to: Take all medications as prescribed by mental healthcare provider. Report any adverse effects and or reactions from the medicines to outpatient provider promptly. Patient has been instructed & cautioned: To not engage in alcohol and or illegal drug use while on prescription medicines. In the event of worsening symptoms,  patient is instructed to call the crisis hotline, 911 and or go to the nearest ED for appropriate evaluation and treatment of symptoms. To follow-up with primary care provider for other medical issues, concerns and or health care needs   The patient was evaluated each day by a clinical provider to ascertain response to treatment. Improvement was noted by the patient's report of decreasing symptoms, improved sleep and appetite, affect, medication tolerance, behavior, and participation in unit programming.  Patient was asked each day to complete a self inventory noting mood, mental status, pain, new symptoms, anxiety and concerns.   Patient responded well to medication and being in a therapeutic and supportive  environment. Positive and appropriate behavior was noted and the patient was motivated for recovery. The patient worked closely with the treatment team and case manager to develop a discharge plan with appropriate goals. Coping skills, problem solving as well as relaxation therapies were also part of the unit programming.   By the day of discharge patient  was in much improved condition than upon admission.  Symptoms were reported as significantly decreased or resolved completely. The patient was motivated to continue taking medication with a goal of continued improvement in mental health.   Disposition: home with CDIOP   Corky Sox, MD 07/05/2022, 11:28 AM

## 2022-07-06 ENCOUNTER — Encounter (HOSPITAL_COMMUNITY): Payer: Self-pay

## 2022-07-06 NOTE — Discharge Planning (Signed)
LCSW attempted to get in contact with patient on number listed on the facesheet, however was unable to reach patient. No voicemail box has been set up. LCSW contacted the patient's cousin [name] [phone] and was able to provide appointment information to her for the patient's follow up. Appt has been arranged with Steele Sizer at [facility] for 07/08/2022 at 12pm. Contact number also provided for their follow up. No other needs to report at this time.   8:26AM: LCSW received phone call back from patient. Information provided to patient who reports he will follow up with agency.   Lucius Conn, LCSW Clinical Social Worker [place] [facility] Ph: [phone]

## 2022-07-08 ENCOUNTER — Telehealth (HOSPITAL_COMMUNITY): Payer: Self-pay | Admitting: Licensed Clinical Social Worker

## 2022-07-08 ENCOUNTER — Encounter (HOSPITAL_COMMUNITY): Payer: Self-pay

## 2022-07-08 ENCOUNTER — Ambulatory Visit (INDEPENDENT_AMBULATORY_CARE_PROVIDER_SITE_OTHER): Payer: Medicaid Other | Admitting: Licensed Clinical Social Worker

## 2022-07-08 DIAGNOSIS — F39 Unspecified mood [affective] disorder: Secondary | ICD-10-CM

## 2022-07-08 DIAGNOSIS — F1020 Alcohol dependence, uncomplicated: Secondary | ICD-10-CM | POA: Diagnosis not present

## 2022-07-08 NOTE — Progress Notes (Signed)
THERAPIST PROGRESS NOTE  Session Time: 11:30 a.m. to 12:30 p.m.   Type of Therapy: Individual   Therapist Response/Interventions: Solution Focused/The therapist reviews his Professional Disclosure Statement and answers Dheeraj's questions about the continuum of care and about AA and getting a Sponsor while providing him with a list of in-person evening AA meetings to which he says he can get a ride.   The therapist educates Lion about alcohol as a central nervous system depressant that will make his depression worse and the risks of drinking with pancreatitis. He informs Aulden that there was likely no possibility that his relationship with his girlfriend would work out with both being in active addiction noting that healthy relationships are generally not possible if even one of the partners in active addiction. The therapist explains the reason that AA generally discourages people with less than a year of sobriety from starting a new relationship.  The therapist discusses his goals for treatment completing his Treatment Plan with [place] giving permission for the therapist to electronically sign on his behalf.   Treatment Goals addressed:   Akshith will abstain from alcohol daily per self-report and random breathalyzers as indicated.      Dates: Start:  07/08/22    Expected End:  01/06/23      Jaydis will report having found closure concerning his girlfriend's death and a decrease in his depressive symptoms as evidenced by a PHQ-9 score of 4 or less and no recurrence of suicidal ideation.      Dates: Start:  07/08/22    Expected End:  01/06/23       Summary: Sydnee Cabal presents for his initial visit being unaware they he had been referred for CD IOP. He says that he wanted to see a therapist for issues with depression and anxiety. He also wants to return to work as soon as possible saying that his work schedule is from 7:30 a.m. to 4 p.m.Due to the recent death of his girlfriend, he says that his  work attendance has been "low" so indicates that he needs a letter for his HR Representative confirming that he attended this visit and will be coming back for additional appointments. After educating him on the continuum of care and confirming that he could attend the evening CD IOP at the [place] says that his preference is to attending weekly individual therapy appointments with this therapist while also attending AA and getting a Sponsor.   He says that his goal is to stop drinking and to no longer be depressed and get over the death of his girlfriend of three years who was 64 years old. She also had a drinking problem with a history of cardiac problems and seizures. He says that the relationship with not a healthy one as all they had in common was sex and drinking. He questions what he could have or should have done differently.  He says that he also wants to address issues with his family noting that most of his family have issues with addiction and gambling and he feels like they treat him differently as he is blind. He says that consequently that he spends a lot of time by himself.   Progress Towards Goals: Initial  Suicidal/Homicidal: No SI or HI   Plan: Return again in 1 weeks.  Diagnosis: Alcohol Use Disorder, Severe and Unspecified Affective Disorder  Collaboration of Care: Other ROI obtained for his HR Representative and a letter provided at his request.   Patient/Guardian was advised Release  of Information must be obtained prior to any record release in order to collaborate their care with an outside provider. Patient/Guardian was advised if they have not already done so to contact the registration department to sign all necessary forms in order for [place] to release information regarding their care.   Consent: Patient/Guardian gives verbal consent for treatment and assignment of benefits for services provided during this visit. Patient/Guardian expressed understanding and  agreed to proceed.   Adam Phenix, [place], LCSW, [facility], [place] 07/08/2022

## 2022-07-15 ENCOUNTER — Ambulatory Visit (INDEPENDENT_AMBULATORY_CARE_PROVIDER_SITE_OTHER): Payer: Medicare HMO | Admitting: Licensed Clinical Social Worker

## 2022-07-15 ENCOUNTER — Ambulatory Visit: Payer: Medicare HMO | Admitting: Podiatry

## 2022-07-15 DIAGNOSIS — F17200 Nicotine dependence, unspecified, uncomplicated: Secondary | ICD-10-CM

## 2022-07-15 DIAGNOSIS — F1020 Alcohol dependence, uncomplicated: Secondary | ICD-10-CM | POA: Diagnosis not present

## 2022-07-15 DIAGNOSIS — F39 Unspecified mood [affective] disorder: Secondary | ICD-10-CM
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
## 2022-07-26 ENCOUNTER — Encounter: Payer: Self-pay | Admitting: Podiatry

## 2022-07-26 ENCOUNTER — Ambulatory Visit (INDEPENDENT_AMBULATORY_CARE_PROVIDER_SITE_OTHER): Payer: Medicare HMO | Admitting: Podiatry

## 2022-07-26 DIAGNOSIS — M79675 Pain in left toe(s): Secondary | ICD-10-CM | POA: Diagnosis not present

## 2022-07-26 DIAGNOSIS — B351 Tinea unguium: Secondary | ICD-10-CM | POA: Diagnosis not present

## 2022-07-26 DIAGNOSIS — M79674 Pain in right toe(s): Secondary | ICD-10-CM

## 2022-07-26 DIAGNOSIS — Q828 Other specified congenital malformations of skin: Secondary | ICD-10-CM | POA: Insufficient documentation

## 2022-07-26 NOTE — Addendum Note (Signed)
Addended by: Gardiner Barefoot on: 07/26/2022 04:01 PM   Modules accepted: Level of Service

## 2022-07-26 NOTE — Progress Notes (Addendum)
This patient presents to the office with chief complaint of long thick painful nails.  Patient says the nails are painful walking and wearing shoes.  This patient is unable to self treat.  This patient is unable to trim his  nails since he is unable to reach his nails.  he presents to the office for preventative foot care services.  General Appearance  Alert, conversant and in no acute stress.  Vascular  Dorsalis pedis and posterior tibial  pulses are palpable  bilaterally.  Capillary return is within normal limits  bilaterally. Temperature is within normal limits  bilaterally.  Neurologic  Senn-Weinstein monofilament wire test within normal limits  bilaterally. Muscle power within normal limits bilaterally.  Nails Thick disfigured discolored nails with subungual debris  from hallux to fifth toes bilaterally. No evidence of bacterial infection or drainage bilaterally.  Orthopedic  No limitations of motion  feet .  No crepitus or effusions noted.  No bony pathology or digital deformities noted.  Skin  normotropic skin noted bilaterally.  No signs of infections or ulcers noted.   Porokeratosis sub 4 right foot.  Onychomycosis  Nails  B/L.  Pain in right toes  Pain in left toes    Debridement of nails both feet followed trimming the nails with dremel tool.   Porokeratosis debrided with # 15 blade as a courtesy.   RTC 3 months.   Gardiner Barefoot DPM

## 2022-07-29 ENCOUNTER — Telehealth (HOSPITAL_COMMUNITY): Payer: Self-pay | Admitting: Licensed Clinical Social Worker

## 2022-07-29 ENCOUNTER — Ambulatory Visit (HOSPITAL_COMMUNITY): Payer: Medicaid Other | Admitting: Licensed Clinical Social Worker

## 2022-07-29 NOTE — Telephone Encounter (Signed)
Kaion leaves two voicemails for this therapist informing him that he will not be able to attend his 4 p.m. appointment today as his medical transportation failed to pick him up.   The therapist messages the Receptionist to call Jayshawn and assist him with rescheduling.   Adam Phenix, [place], LCSW, [place], [place] 07/29/2022

## 2022-08-10 ENCOUNTER — Ambulatory Visit (INDEPENDENT_AMBULATORY_CARE_PROVIDER_SITE_OTHER): Payer: Medicare HMO | Admitting: Licensed Clinical Social Worker

## 2022-08-10 DIAGNOSIS — F1020 Alcohol dependence, uncomplicated: Secondary | ICD-10-CM

## 2022-08-10 DIAGNOSIS — F17200 Nicotine dependence, unspecified, uncomplicated: Secondary | ICD-10-CM

## 2022-08-10 DIAGNOSIS — F39 Unspecified mood [affective] disorder: Secondary | ICD-10-CM

## 2022-08-10 NOTE — Progress Notes (Signed)
  THERAPIST PROGRESS NOTE  Session Time: 3 p.m. to 3;58 p.m.   Type of Therapy: Individual   Therapist Response/Interventions: Solution Focused and CBT/The therapist continues to process Austin [name]'s feelings of grief and makes him aware of bereavement support through Hospice in addition to reminding him about the AA contact number that he gave him at the last appointment. The therapist observes that Austin [name] is doing better as his co-workers and family have noticed this. The therapist also points out that if Austin [name] gets into a future relationship with a woman in which they are both not actively in addiction that the relationship has a greater chance of succeeding.   Treatment Goals addressed:   Austin [name] will abstain from alcohol daily per self-report and random breathalyzers as indicated.      Dates: Start:  07/08/22    Expected End:  01/06/23      Austin [name] will report having found closure concerning his girlfriend's death and a decrease in his depressive symptoms as evidenced by a PHQ-9 score of 4 or less and no recurrence of suicidal ideation.      Dates: Start:  07/08/22    Expected End:  01/06/23       Summary: Austin [name] returns saying that he continues to work and has not drunk alcohol and has gained 23 pound since he started recovery. He is eating better and has started working out. His family is more active in his life since he saw them on his birthday as he sees them usually every weekend. They take him to the grocery and out to eat. He still feels the absence of his girlfriend and not wanting to be here but denies suicidal ideation. He has not attended any meetings as his Aid comes from 5:30 p.m. to 7 or 8 p.m. with Austin [name] getting off work at 4 p.m. His PHQ-9 is a 6. He says that he is getting compliments from friends and work and his family which helps to lift his spirits up and keep him inspired. He thinks about his girlfriend daily and has realistic dreams about her. When he wakes up and realizes he  is in the house alone is when he has thoughts of wishing he was not here without any suicidal ideation plan or intent.   Progress Towards Goals: Progressing  Suicidal/Homicidal: No SI or HI   Plan: Return again in 2 weeks. He says that he is going to see if he can attend an [place] meeting on a Saturday and is informed that his Aid can help him to connect to virtual meetings. He has the number for Hospice for his Aid to input into his phone for him.   Diagnosis: Alcohol Use Disorder, Severe and Unspecified Affective Disorder  Collaboration of Care: N/A  Patient/Guardian was advised Release of Information must be obtained prior to any record release in order to collaborate their care with an outside provider. Patient/Guardian was advised if they have not already done so to contact the registration department to sign all necessary forms in order for [place] to release information regarding their care.   Consent: Patient/Guardian gives verbal consent for treatment and assignment of benefits for services provided during this visit. Patient/Guardian expressed understanding and agreed to proceed.   Austin [name], [place], LCSW, [facility], [place] 08/10/2022

## 2022-08-26 ENCOUNTER — Ambulatory Visit (INDEPENDENT_AMBULATORY_CARE_PROVIDER_SITE_OTHER): Payer: Medicare HMO | Admitting: Licensed Clinical Social Worker

## 2022-08-26 DIAGNOSIS — F1020 Alcohol dependence, uncomplicated: Secondary | ICD-10-CM

## 2022-08-26 DIAGNOSIS — F39 Unspecified mood [affective] disorder: Secondary | ICD-10-CM

## 2022-08-26 NOTE — Progress Notes (Signed)
THERAPIST PROGRESS NOTE  Session Time: 3 p.m. to  3:40 p.m.   Type of Therapy: Individual   Therapist Response/Interventions: Solution Focused/The therapist suggests that Elnora talks to his doctor about possibly getting a note such that he does not have to be on his feet so much if it is aggravating his hip.  The therapist discloses his belief that having a friendship with this woman sounds positive provided that she respect that he is not interested in a romantic relationship at this time.   In response to his request for feedback concerning how he is doing, the therapist indicates that his decrease in depression, avoiding drinking friends, working out, and planning on attending an [place] meeting on the weekend all sound like progress.   Treatment Goals addressed:   Dakai will abstain from alcohol daily per self-report and random breathalyzers as indicated.      Dates: Start:  07/08/22    Expected End:  01/06/23      Kyshon will report having found closure concerning his girlfriend's death and a decrease in his depressive symptoms as evidenced by a PHQ-9 score of 4 or less and no recurrence of suicidal ideation.      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   When the therapist talks to [facility] about possibly getting a doctor's note such that he does not have to stand so much, he says that he is going to contact his doctor's office today.  He says that he has had co-workers invite him to hang out; however, he has not done so as most of them are drinkers. He says that he has a lady friend who has been communicating with him and spending time with him; however, he does not want to date right now. He says that she wants to date. He says that this woman is a non-drinker  and non-smoker but is a "dominant" person.   He has started working out a little bit but has not gotten a schedule in regard to getting a gym. He wants to change some of his furniture as it is old and is a reminder of his deceased girlfriend but is trying to get financing. He says that his credit score is messed up over problems he had with a [employer], jewellery.   Jase says that his male friend is supportive of his going to AA but he has not organized his schedule yet to attend. He rates his depression as a "3" which he says is the lowest it has been in a long time. He notes that his major stressors are his credit and the location of his new. He says that he last drank alcohol in late January of this year before going to the [facility] He says that he does not even think of drinking or miss it. He says that he still has the information about bereavement support but has not had time to call.   Progress Towards Goals: Progressing  Suicidal/Homicidal: No SI or HI   Plan: Return again in 2 weeks.   Diagnosis: Alcohol Use Disorder, Severe and Unspecified Affective Disorder  Collaboration of Care: N/A  Patient/Guardian was advised Release of Information must be obtained prior  to any record release in order to collaborate their care with an outside provider. Patient/Guardian was advised if they have not already done so to contact the registration department to sign all necessary forms in order for [place] to release information regarding their care.   Consent: Patient/Guardian gives verbal consent for treatment and assignment of benefits for services provided during this visit. Patient/Guardian expressed understanding and agreed to proceed.   Adam Phenix, [place], LCSW, [facility], [place] 08/26/2022

## 2022-09-09 ENCOUNTER — Ambulatory Visit (INDEPENDENT_AMBULATORY_CARE_PROVIDER_SITE_OTHER): Payer: Medicare HMO | Admitting: Licensed Clinical Social Worker

## 2022-09-09 DIAGNOSIS — F39 Unspecified mood [affective] disorder: Secondary | ICD-10-CM

## 2022-09-09 DIAGNOSIS — F17200 Nicotine dependence, unspecified, uncomplicated: Secondary | ICD-10-CM

## 2022-09-09 DIAGNOSIS — F1020 Alcohol dependence, uncomplicated: Secondary | ICD-10-CM | POA: Diagnosis not present

## 2022-09-09 NOTE — Progress Notes (Signed)
THERAPIST PROGRESS NOTE  Session Time: 2:55 p.m. to 3:55 p.m.  Type of Therapy: Individual   Therapist Response/Interventions: Solution Focused/The therapist talks to Austin [name] about [employer] proving him with the toll-free number and educating him on nicotine replacement.  The therapist also observes that Austin [name] put himself in some potentially high risk situations over [place] concerning drinking and educates him on the concepts of avoiding people, places, and things and on being smart versus strong.  He notes that Austin [name] indicates that he will "try" to attend a meeting before returning in 4 weeks; however, eventually has Austin [name] commit to attending one meeting before he returns.   Treatment Goals addressed:  Active     Alcohol Use Disorder and Unspecified Mood Disorder      Austin [name] will abstain from alcohol daily per self-report and random breathalyzers as indicated.  (Progressing)     Start:  07/08/22    Expected End:  01/06/23         Austin [name] will report having found closure concerning his girlfriend's death and a decrease in his depressive symptoms as evidenced by a PHQ-9 score of 4 or less and no recurrence of suicidal ideation.  (Progressing)     Start:  07/08/22    Expected End:  01/06/23              Summary: Austin [name] returns saying that he got a doctor's note regarding being able to sit down at work. He is trying to spend less time with the new "lady friend" as she has a "severe care of bipolar." He believes that she is using him to get out of her house and away from her teenage son. Easter weekend, he saw his family and had a cookout with his co-workers noting that they were drinking but he did not drink. He says that when he played cards with his family that they were drinking too He says that nobody got loud or anything.  As far as his depression, he says that he is still working out at home and has not been able to go to the gym nor has he made it to an [employer]. He also  has contacted no one regarding bereavement support.   He says that he still thinks of the deceased girlfriend and misses her a "great deal." He is facing the reality that she is gone noting that it does not hurt as much as it did at first though he is still sad about it. He continues to work on his house noting that making changes helps. He says that he could previously not get along with his deceased girlfriend's friends; however, they have since become friends.   He says that he wants to stop smoking Black-n-milds smoking about one-and-a-half per day.   When the therapist notes Austin [name]'s not avoiding triggers, he recognizes that he has been sober before for months and was stressed about something so said, "f__ it" and took a drink or would tell himself that he was doing well so deserved a drink. He concludes that he needs to get to a meeting.   Progress Towards Goals: Progressing  Suicidal/Homicidal: No SI or HI   Plan: Return again in 2 weeks.   Diagnosis: Alcohol Use Disorder, Severe and Unspecified Affective Disorder  Collaboration of Care: N/A  Patient/Guardian was advised Release of Information must be obtained prior to any record release in order to collaborate their care with an outside provider. Patient/Guardian was advised if they have not already done so  to contact the registration department to sign all necessary forms in order for [place] to release information regarding their care.   Consent: Patient/Guardian gives verbal consent for treatment and assignment of benefits for services provided during this visit. Patient/Guardian expressed understanding and agreed to proceed.   Adam Phenix, [place], LCSW, [facility] [place] 09/09/2022

## 2022-10-07 ENCOUNTER — Ambulatory Visit (HOSPITAL_COMMUNITY): Payer: Medicaid Other | Admitting: Licensed Clinical Social Worker

## 2022-10-21 ENCOUNTER — Ambulatory Visit (INDEPENDENT_AMBULATORY_CARE_PROVIDER_SITE_OTHER): Payer: Medicare HMO | Admitting: Licensed Clinical Social Worker

## 2022-10-21 DIAGNOSIS — F1020 Alcohol dependence, uncomplicated: Secondary | ICD-10-CM

## 2022-10-21 DIAGNOSIS — F17200 Nicotine dependence, unspecified, uncomplicated: Secondary | ICD-10-CM

## 2022-10-21 DIAGNOSIS — F39 Unspecified mood [affective] disorder: Secondary | ICD-10-CM

## 2022-10-21 NOTE — Progress Notes (Signed)
THERAPIST PROGRESS NOTE  Session Time: 3:05 p.m.to 3:50 p.m.   Type of Therapy: Individual   Therapist Response/Interventions: Solution Focused and CBT/The therapist makes the observation that if Austin [name] is not too tired to work out several times per week that he is not too tired to make a meeting thus questioning if he really has an interest in doing so.   He encourages Austin [name] to work on his negative self-talk and practice self-compassion pointing out how the negative self-talk is prone to upset him thus making him more likely to run into things, drop things, etcetera.  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 He, again, encourages Austin [name] to take advantage of the free smoking cessation assistance available to him noting that he will not have to go anywhere. Austin [name] says that his insurance is sending him information on tobacco cessation.   Treatment Goals addressed:  Active     Alcohol Use Disorder and Unspecified Mood Disorder      Austin [name] will abstain from alcohol daily per self-report and random breathalyzers as indicated.  (Completed/Met)     Start:  07/08/22    Expected End:  01/06/23    Resolved:  10/21/22      Austin [name] will report having found closure concerning his girlfriend's death and a decrease in his depressive symptoms as evidenced by a PHQ-9 score of 4 or less and no recurrence of suicidal ideation.  (Completed/Met)     Start:  07/08/22    Expected End:  01/06/23    Resolved:  10/21/22              Summary: Austin [name] returns saying that everything is "pretty much good for the most part." He saw his doctor last week who told him that he was "good and healthy" and "much better than it has been." He picked up about twenty-seven pounds. He has been working overtime a lot. He is working out with a lot of his  co-workers. He continues to stay in contact with his family saying everything is "good so far." He will be attending a family baby shower with no alcohol being served. He receives a call in the session from a young woman he recently met who is asking him to send her money.   He continues to smoke black-n-milds having done nothing about it and has not attended any AA meetings saying it is because he is so tired. He thinks of his ex at times and remains friends with her friends with one who checks on him weekly.  He denies any alcohol use. His PHQ-9 is a 2 today. He says that his last drink was around January 15th or 16th. When pressed, he admits that he does not really want to attend AA and is no longer in need of bereavement support.   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    Progress Towards Goals: Completed  Suicidal/Homicidal: No SI or HI   Plan: Austin [name] wishes to return on a p.r.n. basis   Diagnosis: Alcohol Use Disorder, Severe and Unspecified Affective Disorder  Collaboration of Care: N/A  Patient/Guardian was advised Release of Information must be obtained prior to any record release in order to collaborate their care with an outside provider. Patient/Guardian  was advised if they have not already done so to contact the registration department to sign all necessary forms in order for [place] to release information regarding their care.   Consent: Patient/Guardian gives verbal consent for treatment and assignment of benefits for services provided during this visit. Patient/Guardian expressed understanding and agreed to proceed.   Myrna Blazer, MA, LCSW, [facility], [facility] 10/21/2022

## 2022-10-25 ENCOUNTER — Ambulatory Visit: Payer: Medicare HMO | Admitting: Podiatry

## 2022-10-26 ENCOUNTER — Telehealth: Payer: Self-pay

## 2022-10-26 ENCOUNTER — Ambulatory Visit: Payer: Medicare HMO | Admitting: Podiatry

## 2022-10-26 NOTE — Telephone Encounter (Signed)
Lmovm to inform pt of new appointment time of 3:45 pm this Thursday.

## 2022-10-28 ENCOUNTER — Encounter: Payer: Self-pay | Admitting: Podiatry

## 2022-10-28 ENCOUNTER — Ambulatory Visit (INDEPENDENT_AMBULATORY_CARE_PROVIDER_SITE_OTHER): Payer: Medicare HMO | Admitting: Podiatry

## 2022-10-28 DIAGNOSIS — M79674 Pain in right toe(s): Secondary | ICD-10-CM | POA: Diagnosis not present

## 2022-10-28 DIAGNOSIS — B351 Tinea unguium: Secondary | ICD-10-CM

## 2022-10-28 DIAGNOSIS — M79675 Pain in left toe(s): Secondary | ICD-10-CM

## 2022-10-28 DIAGNOSIS — Q828 Other specified congenital malformations of skin: Secondary | ICD-10-CM

## 2022-10-28 NOTE — Progress Notes (Signed)
This patient presents to the office with chief complaint of long thick painful nails.  Patient says the nails are painful walking and wearing shoes.  This patient is unable to self treat.  This patient is unable to trim his  nails since he is unable to reach his nails.  he presents to the office for preventative foot care services.  General Appearance  Alert, conversant and in no acute stress.  Vascular  Dorsalis pedis and posterior tibial  pulses are palpable  bilaterally.  Capillary return is within normal limits  bilaterally. Temperature is within normal limits  bilaterally.  Neurologic  Senn-Weinstein monofilament wire test within normal limits  bilaterally. Muscle power within normal limits bilaterally.  Nails Thick disfigured discolored nails with subungual debris  from hallux to fifth toes bilaterally. No evidence of bacterial infection or drainage bilaterally.  Orthopedic  No limitations of motion  feet .  No crepitus or effusions noted.  No bony pathology or digital deformities noted.  Skin  normotropic skin noted bilaterally.  No signs of infections or ulcers noted.   Porokeratosis sub 4 right foot.  Onychomycosis  Nails  B/L.  Pain in right toes  Pain in left toes    Debridement of nails both feet followed trimming the nails with dremel tool.   Porokeratosis debrided with # 15 blade as a courtesy.   RTC 3 months.   Ludean Duhart DPM   

## 2023-01-31 ENCOUNTER — Ambulatory Visit: Payer: Medicare HMO | Admitting: Podiatry

## 2023-02-03 ENCOUNTER — Encounter: Payer: Self-pay | Admitting: Podiatry

## 2023-02-03 ENCOUNTER — Ambulatory Visit (INDEPENDENT_AMBULATORY_CARE_PROVIDER_SITE_OTHER): Payer: Medicare HMO | Admitting: Podiatry

## 2023-02-03 DIAGNOSIS — B351 Tinea unguium: Secondary | ICD-10-CM | POA: Diagnosis not present

## 2023-02-03 DIAGNOSIS — M79675 Pain in left toe(s): Secondary | ICD-10-CM | POA: Diagnosis not present

## 2023-02-03 DIAGNOSIS — M79674 Pain in right toe(s): Secondary | ICD-10-CM

## 2023-02-03 DIAGNOSIS — Q828 Other specified congenital malformations of skin: Secondary | ICD-10-CM

## 2023-02-03 NOTE — Progress Notes (Signed)
This patient presents to the office with chief complaint of long thick painful nails.  Patient says the nails are painful walking and wearing shoes.  This patient is unable to self treat.  This patient is unable to trim his  nails since he is unable to reach his nails.  he presents to the office for preventative foot care services.  General Appearance  Alert, conversant and in no acute stress.  Vascular  Dorsalis pedis and posterior tibial  pulses are palpable  bilaterally.  Capillary return is within normal limits  bilaterally. Temperature is within normal limits  bilaterally.  Neurologic  Senn-Weinstein monofilament wire test within normal limits  bilaterally. Muscle power within normal limits bilaterally.  Nails Thick disfigured discolored nails with subungual debris  from hallux to fifth toes bilaterally. No evidence of bacterial infection or drainage bilaterally.  Orthopedic  No limitations of motion  feet .  No crepitus or effusions noted.  No bony pathology or digital deformities noted.  Skin  normotropic skin noted bilaterally.  No signs of infections or ulcers noted.   Porokeratosis sub 4 right foot.  Onychomycosis  Nails  B/L.  Pain in right toes  Pain in left toes    Debridement of nails both feet followed trimming the nails with dremel tool.   Porokeratosis debrided with # 15 blade as a courtesy.   RTC 3 months.   Gregory Mayer DPM   

## 2023-04-14 ENCOUNTER — Ambulatory Visit: Payer: Medicare HMO | Admitting: Podiatry

## 2023-07-17 ENCOUNTER — Emergency Department (HOSPITAL_COMMUNITY)
Admission: EM | Admit: 2023-07-17 | Discharge: 2023-07-17 | Disposition: A | Discharge status: Home / Self Care | Payer: Medicare HMO | Attending: Emergency Medicine | Admitting: Emergency Medicine

## 2023-07-17 ENCOUNTER — Other Ambulatory Visit: Payer: Self-pay

## 2023-07-17 ENCOUNTER — Emergency Department (HOSPITAL_COMMUNITY): Payer: Medicare HMO

## 2023-07-17 DIAGNOSIS — J101 Influenza due to other identified influenza virus with other respiratory manifestations: Secondary | ICD-10-CM | POA: Diagnosis not present

## 2023-07-17 DIAGNOSIS — Z20822 Contact with and (suspected) exposure to covid-19: Secondary | ICD-10-CM | POA: Diagnosis not present

## 2023-07-17 DIAGNOSIS — R21 Rash and other nonspecific skin eruption: Secondary | ICD-10-CM | POA: Diagnosis not present

## 2023-07-17 DIAGNOSIS — R0981 Nasal congestion: Secondary | ICD-10-CM | POA: Diagnosis present

## 2023-07-17 DIAGNOSIS — J111 Influenza due to unidentified influenza virus with other respiratory manifestations: Secondary | ICD-10-CM

## 2023-07-17 LAB — HIV ANTIBODY (ROUTINE TESTING W REFLEX): HIV Screen 4th Generation wRfx: NONREACTIVE

## 2023-07-17 LAB — URINALYSIS, W/ REFLEX TO CULTURE (INFECTION SUSPECTED)
Bacteria, UA: NONE SEEN
Bilirubin Urine: NEGATIVE
Glucose, UA: NEGATIVE mg/dL
Hgb urine dipstick: NEGATIVE
Ketones, ur: 5 mg/dL — AB
Leukocytes,Ua: NEGATIVE
Nitrite: NEGATIVE
Protein, ur: 100 mg/dL — AB
Specific Gravity, Urine: 1.028 (ref 1.005–1.030)
pH: 5 (ref 5.0–8.0)

## 2023-07-17 LAB — RESP PANEL BY RT-PCR (RSV, FLU A&B, COVID)  RVPGX2
Influenza A by PCR: POSITIVE — AB
Influenza B by PCR: NEGATIVE
Resp Syncytial Virus by PCR: NEGATIVE
SARS Coronavirus 2 by RT PCR: NEGATIVE

## 2023-07-17 MED ORDER — DEXAMETHASONE SODIUM PHOSPHATE 4 MG/ML IJ SOLN
4.0000 mg | Freq: Once | INTRAMUSCULAR | Status: AC
  Administered 2023-07-17: 4 mg via INTRAMUSCULAR
  Filled 2023-07-17: qty 1

## 2023-07-17 MED ORDER — DIPHENHYDRAMINE HCL 25 MG PO CAPS
25.0000 mg | ORAL_CAPSULE | Freq: Once | ORAL | Status: AC
  Administered 2023-07-17: 25 mg via ORAL
  Filled 2023-07-17: qty 1

## 2023-07-17 MED ORDER — GUAIFENESIN 100 MG/5ML PO LIQD: 10.0000 mL | Freq: Four times a day (QID) | ORAL | 0 refills | Status: AC | PRN

## 2023-07-17 MED ORDER — ONDANSETRON 4 MG PO TBDP: 4.0000 mg | ORAL_TABLET | Freq: Three times a day (TID) | ORAL | 0 refills | Status: AC | PRN

## 2023-07-17 MED ORDER — DIPHENHYDRAMINE HCL 25 MG PO TABS: 25.0000 mg | ORAL_TABLET | Freq: Four times a day (QID) | ORAL | 0 refills | Status: DC | PRN

## 2023-07-17 MED ORDER — IBUPROFEN 800 MG PO TABS
800.0000 mg | ORAL_TABLET | Freq: Once | ORAL | Status: AC
  Administered 2023-07-17: 800 mg via ORAL
  Filled 2023-07-17: qty 1

## 2023-07-17 NOTE — ED Notes (Signed)
Pt made aware that we need another urine sample

## 2023-07-17 NOTE — Discharge Instructions (Addendum)
 Alternate between Ibuprofen  and Tylenol  every 4 hours as needed for fevers, headaches, and body aches. Take Robitussin as needed for cough, prescription send to the pharmacy. Your urine does not show signs of infection. The STD testing is a send out test.  You will receive a call from the hospital if the results come back positive and you will need receive treatment at that time.  If you do not hear from the hospital that means the results are negative.  Discontinue the medications you're taking for your cold as they may worsen the rash. Take Benadryl  25 mg 4 times a day as needed for itching/rash. Take Zofran  3 times a day as needed for nausea.  Get help right away if: You become short of breath or have trouble breathing. Your skin or nails turn blue. You have very bad pain or stiffness in your neck. You get a sudden headache or pain in your face or ear. You vomit each time you eat or drink.

## 2023-07-17 NOTE — ED Provider Notes (Signed)
 [place] EMERGENCY DEPARTMENT AT [facility] Provider Note   CSN: [id number] Arrival date & time: 07/17/23  1119     History  Chief Complaint  Patient presents with   URI   Rash    Austin [name] is a 58 y.o. male with a history of blindness, thrombocytopenia, and alcohol use disorder who presents the ED today for congestion.  Patient reports 1 week of congestion, sore throat, and headaches.  Denies fevers or vomiting.  Has had intermittent nausea, sore throat, shortness of breath with coughing, and diarrhea.  Denies history of COPD or asthma.  No chest pain.  He purchased over-the-counter supplements and medications for congestion which she has been taking daily.  About 3 days ago he developed a red, itchy rash throughout his body.  No swelling of the mouth or throat.    Additionally, patient endorses some tingling of the penis.  Denies any penile pain or pain of the testicles.  No penile discharge.  He is requesting STD testing.  No pain of the abdomen or low back.  No additional complaints or concerns at this time.    Home Medications Prior to Admission medications   Medication Sig Start Date End Date Taking? Authorizing Provider  diphenhydrAMINE  (BENADRYL ) 25 MG tablet Take 1 tablet (25 mg total) by mouth every 6 (six) hours as needed for up to 7 days. 07/17/23 07/24/23 Yes Waddell Sluder, PA-C  guaiFENesin  (ROBITUSSIN) 100 MG/5ML liquid Take 10 mLs by mouth every 6 (six) hours as needed for up to 7 days for cough or to loosen phlegm. 07/17/23 07/24/23 Yes Waddell Sluder, PA-C  ondansetron  (ZOFRAN -ODT) 4 MG disintegrating tablet Take 1 tablet (4 mg total) by mouth every 8 (eight) hours as needed for up to 14 days for nausea or vomiting. 07/17/23 07/31/23 Yes Waddell Sluder, PA-C  gabapentin  (NEURONTIN ) 300 MG capsule SMARTSIG:1 Capsule(s) By Mouth Every Evening 07/07/22   [provider]  mirtazapine  (REMERON ) 7.5 MG tablet SMARTSIG:1 Tablet(s) By Mouth Every Evening 07/07/22    [provider]      Allergies    Chlorhexidine     Review of Systems   Review of Systems  Respiratory:  Positive for choking.   Skin:  Positive for rash.  All other systems reviewed and are negative.   Physical Exam Updated Vital Signs BP (!) 148/72 (BP Location: Right Arm)   Pulse 70   Temp 98.6 F (37 C) (Oral)   Resp 16   Ht 5' 7 (1.702 m)   Wt 71.2 kg   SpO2 99%   BMI 24.59 kg/m  Physical Exam Vitals and nursing note reviewed.  Constitutional:      General: He is not in acute distress.    Appearance: Normal appearance.  HENT:     Head: Normocephalic and atraumatic.     Mouth/Throat:     Mouth: Mucous membranes are moist.  Eyes:     Conjunctiva/sclera: Conjunctivae normal.     Pupils: Pupils are equal, round, and reactive to light.  Cardiovascular:     Rate and Rhythm: Normal rate and regular rhythm.     Pulses: Normal pulses.     Heart sounds: Normal heart sounds.  Pulmonary:     Effort: Pulmonary effort is normal.     Breath sounds: Normal breath sounds.  Abdominal:     Palpations: Abdomen is soft.     Tenderness: There is no abdominal tenderness.  Skin:    General: Skin is warm and dry.  Findings: Erythema and rash present.     Comments: Erythematous plaques present throughout arms, abdomen, and legs  Neurological:     General: No focal deficit present.     Mental Status: He is alert.  Psychiatric:        Mood and Affect: Mood normal.        Behavior: Behavior normal.    ED Results / Procedures / Treatments   Labs (all labs ordered are listed, but only abnormal results are displayed) Labs Reviewed  RESP PANEL BY RT-PCR (RSV, FLU A&B, COVID)  RVPGX2 - Abnormal; Notable for the following components:      Result Value   Influenza A by PCR POSITIVE (*)    All other components within normal limits  URINALYSIS, W/ REFLEX TO CULTURE (INFECTION SUSPECTED) - Abnormal; Notable for the following components:   Color, Urine AMBER (*)     APPearance HAZY (*)    Ketones, ur 5 (*)    Protein, ur 100 (*)    All other components within normal limits  HIV ANTIBODY (ROUTINE TESTING W REFLEX)  GC/CHLAMYDIA PROBE AMP ([place]) NOT AT [facility]    EKG None  Radiology DG Chest 2 View Result Date: 07/17/2023 CLINICAL DATA:  Short of breath, upper respiratory infection, upper body rash for 3 days EXAM: CHEST - 2 VIEW COMPARISON:  06/29/2022 FINDINGS: The heart size and mediastinal contours are within normal limits. Both lungs are clear. The visualized skeletal structures are unremarkable. IMPRESSION: No active cardiopulmonary disease. Electronically Signed   By: Ozell Daring M.D.   On: 07/17/2023 15:16    Procedures Procedures: not indicated.   Medications Ordered in ED Medications  ibuprofen  (ADVIL ) tablet 800 mg (800 mg Oral Given 07/17/23 1426)  dexamethasone  (DECADRON ) injection 4 mg (4 mg Intramuscular Given 07/17/23 1426)  diphenhydrAMINE  (BENADRYL ) capsule 25 mg (25 mg Oral Given 07/17/23 1426)    ED Course/ Medical Decision Making/ A&P                                 Medical Decision Making Amount and/or Complexity of Data Reviewed Radiology: ordered.  Risk Prescription drug management.   This patient presents to the ED for concern of cough, this involves an extensive number of treatment options, and is a complaint that carries with it a high risk of complications and morbidity.   Differential diagnosis includes: COVID, flu, RSV, bronchitis, pneumonia, other viral illness, etc.   Comorbidities  See HPI above   Additional History  Additional history obtained from prior records   Lab Tests  I ordered and personally interpreted labs.  The pertinent results include:   Respiratory swab positive for flu A   Imaging Studies  I ordered imaging studies including CXR  I independently visualized and interpreted imaging which showed: No active cardiopulmonary disease. I agree with the radiologist  interpretation   Problem List / ED Course / Critical Interventions / Medication Management  Cough, congestion, body aches, and headache for the past week.  Erythematous and pruritic rash for the past 3 days, after starting OTC supplements and medication for nasal congestion. I ordered medications including: Decadron  and Benadryl  for rash Ibuprofen  for body aches/headaches Reevaluation of the patient after these medicines showed that the patient improved I have reviewed the patients home medicines and have made adjustments as needed Inform patient that STD testing is a send out lab and he will receive a call from  the hospital if anything comes back positive and then he will need to return to the ED to receive treatment at that time.   Social Determinants of Health  Access to healthcare   Test / Admission - Considered  Discussed findings with patient.  All questions were answered. He is hemodynamically stable to be discharged home. Return precautions given.       Final Clinical Impression(s) / ED Diagnoses Final diagnoses:  Flu  Rash    Rx / DC Orders ED Discharge Orders          Ordered    guaiFENesin  (ROBITUSSIN) 100 MG/5ML liquid  Every 6 hours PRN        07/17/23 1715    diphenhydrAMINE  (BENADRYL ) 25 MG tablet  Every 6 hours PRN        07/17/23 1715    ondansetron  (ZOFRAN -ODT) 4 MG disintegrating tablet  Every 8 hours PRN        07/17/23 1720              Waddell Sluder, PA-C 07/17/23 1726    Tegeler, Lonni PARAS, MD 07/17/23 [phone]

## 2023-07-17 NOTE — ED Triage Notes (Addendum)
 Pt arrived via POV. C/o URI for 1x week- congestion, sore throat, headache. Pt stated they started having a pruritic rash on upper body 3x days ago.   Aox4  Pt requesting STI screening

## 2023-07-18 LAB — GC/CHLAMYDIA PROBE AMP (~~LOC~~) NOT AT ARMC
Chlamydia: NEGATIVE
Comment: NEGATIVE
Comment: NORMAL
Neisseria Gonorrhea: NEGATIVE

## 2023-07-22 ENCOUNTER — Telehealth (HOSPITAL_COMMUNITY): Payer: Self-pay

## 2023-10-25 ENCOUNTER — Emergency Department (HOSPITAL_COMMUNITY)
Admission: EM | Admit: 2023-10-25 | Discharge: 2023-10-25 | Disposition: A | Discharge status: Home / Self Care | Attending: Emergency Medicine | Admitting: Emergency Medicine

## 2023-10-25 ENCOUNTER — Emergency Department (HOSPITAL_COMMUNITY)

## 2023-10-25 ENCOUNTER — Encounter (HOSPITAL_COMMUNITY): Payer: Self-pay | Admitting: Emergency Medicine

## 2023-10-25 ENCOUNTER — Other Ambulatory Visit: Payer: Self-pay

## 2023-10-25 DIAGNOSIS — R1013 Epigastric pain: Secondary | ICD-10-CM

## 2023-10-25 DIAGNOSIS — I10 Essential (primary) hypertension: Secondary | ICD-10-CM | POA: Insufficient documentation

## 2023-10-25 DIAGNOSIS — K2920 Alcoholic gastritis without bleeding: Secondary | ICD-10-CM | POA: Diagnosis not present

## 2023-10-25 DIAGNOSIS — R109 Unspecified abdominal pain: Secondary | ICD-10-CM | POA: Diagnosis present

## 2023-10-25 LAB — URINALYSIS, ROUTINE W REFLEX MICROSCOPIC
Bacteria, UA: NONE SEEN
Bilirubin Urine: NEGATIVE
Glucose, UA: NEGATIVE mg/dL
Ketones, ur: 5 mg/dL — AB
Leukocytes,Ua: NEGATIVE
Nitrite: NEGATIVE
Protein, ur: 30 mg/dL — AB
RBC / HPF: >50 RBC/hpf (ref 0–5)
Specific Gravity, Urine: 1.02 (ref 1.005–1.030)
pH: 5 (ref 5.0–8.0)

## 2023-10-25 LAB — COMPREHENSIVE METABOLIC PANEL WITH GFR
ALT: 18 U/L (ref 0–44)
AST: 31 U/L (ref 15–41)
Albumin: 3.8 g/dL (ref 3.5–5.0)
Alkaline Phosphatase: 70 U/L (ref 38–126)
Anion gap: 11 (ref 5–15)
BUN: 7 mg/dL (ref 6–20)
CO2: 27 mmol/L (ref 22–32)
Calcium: 9.3 mg/dL (ref 8.9–10.3)
Chloride: 97 mmol/L — ABNORMAL LOW (ref 98–111)
Creatinine, Ser: 1.05 mg/dL (ref 0.61–1.24)
GFR, Estimated: >60 mL/min (ref >60)
Glucose, Bld: 141 mg/dL — ABNORMAL HIGH (ref 70–99)
Potassium: 4 mmol/L (ref 3.5–5.1)
Sodium: 135 mmol/L (ref 135–145)
Total Bilirubin: 1.2 mg/dL (ref 0.0–1.2)
Total Protein: 7 g/dL (ref 6.5–8.1)

## 2023-10-25 LAB — CBC WITH DIFFERENTIAL/PLATELET
Abs Immature Granulocytes: 0 10*3/uL (ref 0.00–0.07)
Basophils Absolute: 0 10*3/uL (ref 0.0–0.1)
Basophils Relative: 0 %
Eosinophils Absolute: 0.1 10*3/uL (ref 0.0–0.5)
Eosinophils Relative: 1 %
HCT: 42.6 % (ref 39.0–52.0)
Hemoglobin: 14.7 g/dL (ref 13.0–17.0)
Immature Granulocytes: 0 %
Lymphocytes Relative: 46 %
Lymphs Abs: 2.2 10*3/uL (ref 0.7–4.0)
MCH: 31.8 pg (ref 26.0–34.0)
MCHC: 34.5 g/dL (ref 30.0–36.0)
MCV: 92.2 fL (ref 80.0–100.0)
Monocytes Absolute: 0.5 10*3/uL (ref 0.1–1.0)
Monocytes Relative: 11 %
Neutro Abs: 2.1 10*3/uL (ref 1.7–7.7)
Neutrophils Relative %: 42 %
Platelets: 149 10*3/uL — ABNORMAL LOW (ref 150–400)
RBC: 4.62 MIL/uL (ref 4.22–5.81)
RDW: 13.2 % (ref 11.5–15.5)
WBC: 4.9 10*3/uL (ref 4.0–10.5)
nRBC: 0 % (ref 0.0–0.2)

## 2023-10-25 LAB — LIPASE, BLOOD: Lipase: 27 U/L (ref 11–51)

## 2023-10-25 LAB — ETHANOL: Alcohol, Ethyl (B): <15 mg/dL (ref <15)

## 2023-10-25 MED ORDER — ONDANSETRON HCL 4 MG/2ML IJ SOLN
4.0000 mg | Freq: Once | INTRAMUSCULAR | Status: AC
  Administered 2023-10-25: 4 mg via INTRAVENOUS
  Filled 2023-10-25: qty 2

## 2023-10-25 MED ORDER — SODIUM CHLORIDE 0.9 % IV BOLUS
1000.0000 mL | Freq: Once | INTRAVENOUS | Status: AC
  Administered 2023-10-25: 1000 mL via INTRAVENOUS

## 2023-10-25 MED ORDER — ONDANSETRON 4 MG PO TBDP: 4.0000 mg | ORAL_TABLET | Freq: Four times a day (QID) | ORAL | 0 refills | Status: DC | PRN

## 2023-10-25 MED ORDER — SUCRALFATE 1 G PO TABS
1.0000 g | ORAL_TABLET | Freq: Three times a day (TID) | ORAL | 0 refills | Status: DC
Start: 2023-10-25 — End: 2023-12-17

## 2023-10-25 MED ORDER — ONDANSETRON 4 MG PO TBDP
4.0000 mg | ORAL_TABLET | Freq: Once | ORAL | Status: AC
  Administered 2023-10-25: 4 mg via ORAL
  Filled 2023-10-25: qty 1

## 2023-10-25 MED ORDER — FENTANYL CITRATE PF 50 MCG/ML IJ SOSY
25.0000 ug | PREFILLED_SYRINGE | Freq: Once | INTRAMUSCULAR | Status: AC
  Administered 2023-10-25: 25 ug via INTRAMUSCULAR
  Filled 2023-10-25: qty 1

## 2023-10-25 MED ORDER — LIDOCAINE VISCOUS HCL 2 % MT SOLN
15.0000 mL | Freq: Once | OROMUCOSAL | Status: AC
  Administered 2023-10-25: 15 mL via ORAL
  Filled 2023-10-25: qty 15

## 2023-10-25 MED ORDER — MORPHINE SULFATE (PF) 4 MG/ML IV SOLN
4.0000 mg | Freq: Once | INTRAVENOUS | Status: AC
  Administered 2023-10-25: 4 mg via INTRAVENOUS
  Filled 2023-10-25: qty 1

## 2023-10-25 MED ORDER — ALUM & MAG HYDROXIDE-SIMETH 200-200-20 MG/5ML PO SUSP
30.0000 mL | Freq: Once | ORAL | Status: AC
  Administered 2023-10-25: 30 mL via ORAL
  Filled 2023-10-25: qty 30

## 2023-10-25 NOTE — ED Notes (Signed)
 Patient reports to RN that he did not feel any relief from the pain medicine.

## 2023-10-25 NOTE — ED Provider Triage Note (Signed)
 Emergency Medicine Provider Triage Evaluation Note  Austin [name] , a 58 y.o. male  was evaluated in triage.  Pt complains of Epigastric, RUQ radiates to right upper back 3 days associated with N and decreased appetite. Hx of EtOH abuse. Taking a lot of ibuprofen  reports 2 days about 2,400mg  a day  Review of Systems  Positive: Abd pain, dizziness, feels unwell  Negative: CP  Physical Exam  BP (!) 156/94 (BP Location: Left Arm)   Pulse 61   Temp 99 F (37.2 C) (Oral)   Resp 14   Wt 68 kg   SpO2 98%   BMI 23.49 kg/m  Gen:   Awake, no distress   Resp:  Normal effort  MSK:   Moves extremities without difficulty  Other:    Medical Decision Making  Medically screening exam initiated at 1:22 PM.  Appropriate orders placed.  Austin [name] was informed that the remainder of the evaluation will be completed by another provider, this initial triage assessment does not replace that evaluation, and the importance of remaining in the ED until their evaluation is complete.     Eudora Heron, PA-C 10/25/23 1325

## 2023-10-25 NOTE — ED Provider Notes (Signed)
 [place] EMERGENCY DEPARTMENT AT [facility] Provider Note   CSN: [id number] Arrival date & time: 10/25/23  1151     History  Chief Complaint  Patient presents with   Abdominal Pain   Generalized Body Aches    Austin [name] is a 58 y.o. male with past medical history significant for alcohol abuse, hypertension, pancreatitis, alcohol withdrawals, who presents with concern for abdominal pain, generalized bodyaches and cramping for 1 week.  He reports that this weekend he did drink heavily at some graduation celebrations, and had some spicy chicken, has had some abdominal cramping and pain since then.  Has not been able to tolerate p.o.  He reports that he has been sober since then.  Does not feel like he is going through severe withdrawals but does feel generally unwell.   Abdominal Pain      Home Medications Prior to Admission medications   Medication Sig Start Date End Date Taking? Authorizing Provider  ondansetron  (ZOFRAN -ODT) 4 MG disintegrating tablet Take 1 tablet (4 mg total) by mouth every 6 (six) hours as needed for nausea or vomiting. 10/25/23  Yes Kinsler Soeder H, PA-C  sucralfate  (CARAFATE ) 1 g tablet Take 1 tablet (1 g total) by mouth 4 (four) times daily -  with meals and at bedtime. 10/25/23  Yes Naylin Burkle H, PA-C  diphenhydrAMINE  (BENADRYL ) 25 MG tablet Take 1 tablet (25 mg total) by mouth every 6 (six) hours as needed for up to 7 days. 07/17/23 07/24/23  Sonnie Dusky, PA-C  gabapentin  (NEURONTIN ) 300 MG capsule SMARTSIG:1 Capsule(s) By Mouth Every Evening 07/07/22   [provider]  mirtazapine  (REMERON ) 7.5 MG tablet SMARTSIG:1 Tablet(s) By Mouth Every Evening 07/07/22   [provider]      Allergies    Chlorhexidine     Review of Systems   Review of Systems  Gastrointestinal:  Positive for abdominal pain.  All other systems reviewed and are negative.   Physical Exam Updated Vital Signs BP (!) 161/92   Pulse (!) 57    Temp 98.2 F (36.8 C) (Oral)   Resp (!) 23   Ht 5\' 7"  (1.702 m)   Wt 68 kg   SpO2 100%   BMI 23.49 kg/m  Physical Exam Vitals and nursing note reviewed.  Constitutional:      General: He is not in acute distress.    Appearance: Normal appearance.  HENT:     Head: Normocephalic and atraumatic.  Eyes:     General:        Right eye: No discharge.        Left eye: No discharge.  Cardiovascular:     Rate and Rhythm: Normal rate and regular rhythm.     Heart sounds: No murmur heard.    No friction rub. No gallop.  Pulmonary:     Effort: Pulmonary effort is normal.     Breath sounds: Normal breath sounds.  Abdominal:     General: Bowel sounds are normal.     Palpations: Abdomen is soft.     Comments: Moderate tenderness in the epigastric region, no rebound, rigidity, guarding, no focal right upper quadrant tenderness, negative Murphy sign.  Skin:    General: Skin is warm and dry.     Capillary Refill: Capillary refill takes less than 2 seconds.  Neurological:     Mental Status: He is alert and oriented to person, place, and time.  Psychiatric:        Mood and Affect: Mood  normal.        Behavior: Behavior normal.     ED Results / Procedures / Treatments   Labs (all labs ordered are listed, but only abnormal results are displayed) Labs Reviewed  COMPREHENSIVE METABOLIC PANEL WITH GFR - Abnormal; Notable for the following components:      Result Value   Chloride 97 (*)    Glucose, Bld 141 (*)    All other components within normal limits  CBC WITH DIFFERENTIAL/PLATELET - Abnormal; Notable for the following components:   Platelets 149 (*)    All other components within normal limits  URINALYSIS, ROUTINE W REFLEX MICROSCOPIC - Abnormal; Notable for the following components:   Color, Urine AMBER (*)    APPearance HAZY (*)    Hgb urine dipstick LARGE (*)    Ketones, ur 5 (*)    Protein, ur 30 (*)    All other components within normal limits  LIPASE, BLOOD  ETHANOL     EKG None  Radiology US  Abdomen Limited RUQ (LIVER/GB) Result Date: 10/25/2023 CLINICAL DATA:  Right upper quadrant abdominal pain. EXAM: ULTRASOUND ABDOMEN LIMITED RIGHT UPPER QUADRANT COMPARISON:  None Available. FINDINGS: Gallbladder: No gallstones or wall thickening visualized. No sonographic Murphy sign noted by sonographer. Common bile duct: Diameter: 3 mm. Liver: Liver parenchyma appears mildly and diffusely echogenic which may reflect underlying hepatic steatosis. No overt cirrhotic morphology or focal hepatic lesion identified. No evidence of intrahepatic biliary ductal dilatation. Portal vein is patent on color Doppler imaging with normal direction of blood flow towards the liver. Other: No ascites identified in the right upper quadrant. IMPRESSION: 1. Normal appearance of the gallbladder and biliary tree. 2. Mildly and diffusely echogenic liver parenchyma which may reflect underlying hepatic steatosis. Electronically Signed   By: Erica Hau M.D.   On: 10/25/2023 16:02    Procedures Procedures    Medications Ordered in ED Medications  ondansetron  (ZOFRAN -ODT) disintegrating tablet 4 mg (4 mg Oral Given 10/25/23 1444)  fentaNYL  (SUBLIMAZE ) injection 25 mcg (25 mcg Intramuscular Given 10/25/23 1445)  morphine  (PF) 4 MG/ML injection 4 mg (4 mg Intravenous Given 10/25/23 1837)  ondansetron  (ZOFRAN ) injection 4 mg (4 mg Intravenous Given 10/25/23 1835)  sodium chloride  0.9 % bolus 1,000 mL (0 mLs Intravenous Stopped 10/25/23 2121)  alum & mag hydroxide-simeth (MAALOX/MYLANTA) 200-200-20 MG/5ML suspension 30 mL (30 mLs Oral Given 10/25/23 1831)    And  lidocaine  (XYLOCAINE ) 2 % viscous mouth solution 15 mL (15 mLs Oral Given 10/25/23 1831)    ED Course/ Medical Decision Making/ A&P                                 Medical Decision Making Risk OTC drugs. Prescription drug management.   This patient is a 58 y.o. male  who presents to the ED for concern of abdominal pain, nausea.    Differential diagnoses prior to evaluation: The emergent differential diagnosis includes, but is not limited to,  The causes of generalized abdominal pain include but are not limited to AAA, mesenteric ischemia, appendicitis, diverticulitis, DKA, gastritis, gastroenteritis, AMI, nephrolithiasis, pancreatitis, peritonitis, adrenal insufficiency,lead poisoning, iron toxicity, intestinal ischemia, constipation, UTI,SBO/LBO, splenic rupture, biliary disease, IBD, IBS, PUD, or hepatitis . This is not an exhaustive differential.   Past Medical History / Co-morbidities / Social History:  alcohol abuse, hypertension, pancreatitis, alcohol withdrawals  Additional history: Chart reviewed. Pertinent results include: Reviewed lab work, imaging from previous emergency department  visits.  Physical Exam: Physical exam performed. The pertinent findings include:  Moderate tenderness in the epigastric region, no rebound, rigidity, guarding, no focal right upper quadrant tenderness, negative Murphy sign.  Lab Tests/Imaging studies: I personally interpreted labs/imaging and the pertinent results include: CBC unremarkable other than very mildly low platelets at 149, UA with some ketones and large hemoglobin, otherwise unremarkable, lipase unremarkable, negative ethanol, CMP with no elevation of liver enzymes or bilirubin.  Right upper quadrant ultrasound with diffuse hepatic steatosis, but no acute pathology.  Overall symptoms are not consistent with acute pancreatitis, I suspect that patient has alcoholic gastritis. I agree with the radiologist interpretation.  Medications: I ordered medication including fluid bolus, morphine , zofran , GI cocktail.  I have reviewed the patients home medicines and have made adjustments as needed.   Disposition: After consideration of the diagnostic results and the patients response to treatment, I feel that patient feeling improved after medications, symptoms consistent with  alcoholic gastritis, stable to follow-up with Carafate  and Zofran  as needed.   emergency department workup does not suggest an emergent condition requiring admission or immediate intervention beyond what has been performed at this time. The plan is: as above. The patient is safe for discharge and has been instructed to return immediately for worsening symptoms, change in symptoms or any other concerns.  Final Clinical Impression(s) / ED Diagnoses Final diagnoses:  Epigastric pain  Acute alcoholic gastritis without hemorrhage    Rx / DC Orders ED Discharge Orders          Ordered    sucralfate  (CARAFATE ) 1 g tablet  3 times daily with meals & bedtime        10/25/23 2123    ondansetron  (ZOFRAN -ODT) 4 MG disintegrating tablet  Every 6 hours PRN        10/25/23 2123              Alioune Hodgkin, F[employer]ie Housekeeper 10/25/23 2222    Hershel Los, MD 10/26/23 [phone]

## 2023-10-25 NOTE — Discharge Instructions (Addendum)
 You can use the nausea medication and Carafate  medication that prescribing as needed for abdominal pain, nausea, vomiting.  Continue not drinking alcohol and your symptoms are likely to improve.  Please return if you have significant worsening pain despite treatment.

## 2023-10-25 NOTE — ED Notes (Signed)
 Pt unable to give a urine sample right now will try after iv fluids.

## 2023-10-25 NOTE — ED Triage Notes (Signed)
 Patient c/o abdominal pain and generalized body aches and cramping x 1 week.  Patient reports he's been unable to eat d/t nausea since Sunday and has not had a BM since Sunday.  Patient has a history of pancreatitis.

## 2023-12-17 ENCOUNTER — Emergency Department (HOSPITAL_COMMUNITY)

## 2023-12-17 ENCOUNTER — Inpatient Hospital Stay (HOSPITAL_COMMUNITY)
Admission: EM | Admit: 2023-12-17 | Discharge: 2023-12-20 | DRG: 439 | Disposition: A | Discharge status: Home / Self Care | Attending: Internal Medicine | Admitting: Internal Medicine

## 2023-12-17 ENCOUNTER — Encounter (HOSPITAL_COMMUNITY): Payer: Self-pay

## 2023-12-17 ENCOUNTER — Other Ambulatory Visit: Payer: Self-pay

## 2023-12-17 DIAGNOSIS — E871 Hypo-osmolality and hyponatremia: Secondary | ICD-10-CM | POA: Diagnosis present

## 2023-12-17 DIAGNOSIS — M62838 Other muscle spasm: Secondary | ICD-10-CM | POA: Diagnosis present

## 2023-12-17 DIAGNOSIS — F419 Anxiety disorder, unspecified: Secondary | ICD-10-CM | POA: Diagnosis present

## 2023-12-17 DIAGNOSIS — K852 Alcohol induced acute pancreatitis without necrosis or infection: Secondary | ICD-10-CM | POA: Diagnosis present

## 2023-12-17 DIAGNOSIS — Z833 Family history of diabetes mellitus: Secondary | ICD-10-CM | POA: Diagnosis not present

## 2023-12-17 DIAGNOSIS — R109 Unspecified abdominal pain: Secondary | ICD-10-CM | POA: Diagnosis present

## 2023-12-17 DIAGNOSIS — I10 Essential (primary) hypertension: Secondary | ICD-10-CM | POA: Diagnosis present

## 2023-12-17 DIAGNOSIS — K860 Alcohol-induced chronic pancreatitis: Secondary | ICD-10-CM | POA: Diagnosis present

## 2023-12-17 DIAGNOSIS — H4089 Other specified glaucoma: Secondary | ICD-10-CM | POA: Diagnosis present

## 2023-12-17 DIAGNOSIS — R030 Elevated blood-pressure reading, without diagnosis of hypertension: Secondary | ICD-10-CM | POA: Insufficient documentation

## 2023-12-17 DIAGNOSIS — F17290 Nicotine dependence, other tobacco product, uncomplicated: Secondary | ICD-10-CM | POA: Diagnosis present

## 2023-12-17 DIAGNOSIS — D696 Thrombocytopenia, unspecified: Secondary | ICD-10-CM | POA: Diagnosis present

## 2023-12-17 DIAGNOSIS — K859 Acute pancreatitis without necrosis or infection, unspecified: Principal | ICD-10-CM | POA: Diagnosis present

## 2023-12-17 DIAGNOSIS — H543 Unqualified visual loss, both eyes: Secondary | ICD-10-CM | POA: Diagnosis present

## 2023-12-17 DIAGNOSIS — K2920 Alcoholic gastritis without bleeding: Secondary | ICD-10-CM | POA: Diagnosis present

## 2023-12-17 DIAGNOSIS — F10239 Alcohol dependence with withdrawal, unspecified: Secondary | ICD-10-CM | POA: Diagnosis not present

## 2023-12-17 DIAGNOSIS — Z96643 Presence of artificial hip joint, bilateral: Secondary | ICD-10-CM | POA: Diagnosis present

## 2023-12-17 DIAGNOSIS — E876 Hypokalemia: Secondary | ICD-10-CM | POA: Diagnosis not present

## 2023-12-17 DIAGNOSIS — Z87440 Personal history of urinary (tract) infections: Secondary | ICD-10-CM

## 2023-12-17 DIAGNOSIS — Z79899 Other long term (current) drug therapy: Secondary | ICD-10-CM

## 2023-12-17 DIAGNOSIS — Z8249 Family history of ischemic heart disease and other diseases of the circulatory system: Secondary | ICD-10-CM

## 2023-12-17 DIAGNOSIS — D6959 Other secondary thrombocytopenia: Secondary | ICD-10-CM | POA: Diagnosis present

## 2023-12-17 DIAGNOSIS — F1020 Alcohol dependence, uncomplicated: Secondary | ICD-10-CM | POA: Diagnosis not present

## 2023-12-17 LAB — CBC WITH DIFFERENTIAL/PLATELET
Abs Immature Granulocytes: 0.01 K/uL (ref 0.00–0.07)
Basophils Absolute: 0 K/uL (ref 0.0–0.1)
Basophils Relative: 0 %
Eosinophils Absolute: 0.1 K/uL (ref 0.0–0.5)
Eosinophils Relative: 1 %
HCT: 42.1 % (ref 39.0–52.0)
Hemoglobin: 14.5 g/dL (ref 13.0–17.0)
Immature Granulocytes: 0 %
Lymphocytes Relative: 43 %
Lymphs Abs: 2.4 K/uL (ref 0.7–4.0)
MCH: 31.9 pg (ref 26.0–34.0)
MCHC: 34.4 g/dL (ref 30.0–36.0)
MCV: 92.5 fL (ref 80.0–100.0)
Monocytes Absolute: 0.6 K/uL (ref 0.1–1.0)
Monocytes Relative: 11 %
Neutro Abs: 2.4 K/uL (ref 1.7–7.7)
Neutrophils Relative %: 45 %
Platelets: 131 K/uL — ABNORMAL LOW (ref 150–400)
RBC: 4.55 MIL/uL (ref 4.22–5.81)
RDW: 13.2 % (ref 11.5–15.5)
WBC: 5.5 K/uL (ref 4.0–10.5)
nRBC: 0 % (ref 0.0–0.2)

## 2023-12-17 LAB — COMPREHENSIVE METABOLIC PANEL WITH GFR
ALT: 31 U/L (ref 0–44)
AST: 38 U/L (ref 15–41)
Albumin: 3.9 g/dL (ref 3.5–5.0)
Alkaline Phosphatase: 96 U/L (ref 38–126)
Anion gap: 8 (ref 5–15)
BUN: 9 mg/dL (ref 6–20)
CO2: 26 mmol/L (ref 22–32)
Calcium: 9 mg/dL (ref 8.9–10.3)
Chloride: 100 mmol/L (ref 98–111)
Creatinine, Ser: 0.98 mg/dL (ref 0.61–1.24)
GFR, Estimated: >60 mL/min (ref >60)
Glucose, Bld: 141 mg/dL — ABNORMAL HIGH (ref 70–99)
Potassium: 4.1 mmol/L (ref 3.5–5.1)
Sodium: 134 mmol/L — ABNORMAL LOW (ref 135–145)
Total Bilirubin: 0.8 mg/dL (ref 0.0–1.2)
Total Protein: 7 g/dL (ref 6.5–8.1)

## 2023-12-17 LAB — URINALYSIS, ROUTINE W REFLEX MICROSCOPIC
Bacteria, UA: NONE SEEN
Bilirubin Urine: NEGATIVE
Glucose, UA: NEGATIVE mg/dL
Ketones, ur: NEGATIVE mg/dL
Leukocytes,Ua: NEGATIVE
Nitrite: NEGATIVE
Protein, ur: NEGATIVE mg/dL
Specific Gravity, Urine: 1.033 — ABNORMAL HIGH (ref 1.005–1.030)
pH: 7 (ref 5.0–8.0)

## 2023-12-17 LAB — LIPASE, BLOOD: Lipase: 120 U/L — ABNORMAL HIGH (ref 11–51)

## 2023-12-17 MED ORDER — IOHEXOL 300 MG/ML  SOLN
100.0000 mL | Freq: Once | INTRAMUSCULAR | Status: AC | PRN
  Administered 2023-12-17: 100 mL via INTRAVENOUS

## 2023-12-17 MED ORDER — ACETAMINOPHEN 325 MG PO TABS
650.0000 mg | ORAL_TABLET | Freq: Four times a day (QID) | ORAL | Status: DC | PRN
Start: 2023-12-17 — End: 2023-12-20
  Administered 2023-12-19: 650 mg via ORAL
  Filled 2023-12-17: qty 2

## 2023-12-17 MED ORDER — ACETAMINOPHEN 650 MG RE SUPP: 650.0000 mg | Freq: Four times a day (QID) | RECTAL | Status: DC | PRN

## 2023-12-17 MED ORDER — ENOXAPARIN SODIUM 40 MG/0.4ML IJ SOSY
40.0000 mg | PREFILLED_SYRINGE | INTRAMUSCULAR | Status: DC
  Administered 2023-12-17 – 2023-12-19 (×3): 40 mg via SUBCUTANEOUS
  Filled 2023-12-17 (×3): qty 0.4

## 2023-12-17 MED ORDER — LACTATED RINGERS IV BOLUS
1000.0000 mL | Freq: Once | INTRAVENOUS | Status: AC
  Administered 2023-12-17: 1000 mL via INTRAVENOUS

## 2023-12-17 MED ORDER — ONDANSETRON HCL 4 MG PO TABS
4.0000 mg | ORAL_TABLET | Freq: Four times a day (QID) | ORAL | Status: DC | PRN
Start: 2023-12-17 — End: 2023-12-20

## 2023-12-17 MED ORDER — HYDROMORPHONE HCL 1 MG/ML IJ SOLN
0.5000 mg | INTRAMUSCULAR | Status: DC | PRN
  Administered 2023-12-17 – 2023-12-20 (×10): 0.5 mg via INTRAVENOUS
  Filled 2023-12-17 (×10): qty 0.5

## 2023-12-17 MED ORDER — CYCLOBENZAPRINE HCL 10 MG PO TABS
10.0000 mg | ORAL_TABLET | Freq: Three times a day (TID) | ORAL | Status: DC | PRN
  Filled 2023-12-17: qty 1

## 2023-12-17 MED ORDER — ONDANSETRON HCL 4 MG/2ML IJ SOLN
4.0000 mg | Freq: Once | INTRAMUSCULAR | Status: AC
  Administered 2023-12-17: 4 mg via INTRAVENOUS
  Filled 2023-12-17: qty 2

## 2023-12-17 MED ORDER — MORPHINE SULFATE (PF) 4 MG/ML IV SOLN
4.0000 mg | Freq: Once | INTRAVENOUS | Status: AC
  Administered 2023-12-17: 4 mg via INTRAVENOUS
  Filled 2023-12-17: qty 1

## 2023-12-17 MED ORDER — NICOTINE 14 MG/24HR TD PT24
14.0000 mg | MEDICATED_PATCH | TRANSDERMAL | Status: DC
  Administered 2023-12-17 – 2023-12-19 (×3): 14 mg via TRANSDERMAL
  Filled 2023-12-17 (×3): qty 1

## 2023-12-17 MED ORDER — LACTATED RINGERS IV SOLN: INTRAVENOUS | Status: DC

## 2023-12-17 MED ORDER — OXYCODONE HCL 5 MG PO TABS
5.0000 mg | ORAL_TABLET | ORAL | Status: DC | PRN
  Administered 2023-12-17 – 2023-12-20 (×8): 5 mg via ORAL
  Filled 2023-12-17 (×10): qty 1

## 2023-12-17 MED ORDER — PANTOPRAZOLE SODIUM 40 MG PO TBEC
40.0000 mg | DELAYED_RELEASE_TABLET | Freq: Every day | ORAL | Status: DC
  Administered 2023-12-17 – 2023-12-20 (×4): 40 mg via ORAL
  Filled 2023-12-17 (×4): qty 1

## 2023-12-17 MED ORDER — ONDANSETRON HCL 4 MG/2ML IJ SOLN
4.0000 mg | Freq: Four times a day (QID) | INTRAMUSCULAR | Status: DC | PRN
  Administered 2023-12-17 – 2023-12-20 (×6): 4 mg via INTRAVENOUS
  Filled 2023-12-17 (×6): qty 2

## 2023-12-17 MED ORDER — HYDRALAZINE HCL 20 MG/ML IJ SOLN
10.0000 mg | Freq: Three times a day (TID) | INTRAMUSCULAR | Status: DC | PRN
  Administered 2023-12-17 – 2023-12-19 (×3): 10 mg via INTRAVENOUS
  Filled 2023-12-17 (×3): qty 1

## 2023-12-17 NOTE — H&P (Signed)
 History and Physical    Patient: Austin [name] FMW:[id number] DOB: March 28, 1966 DOA: 12/17/2023 DOS: the patient was seen and examined on 12/17/2023 PCP: Pcp, No  Patient coming from: Home  Chief Complaint:  Chief Complaint  Patient presents with   Abdominal Pain   Emesis       HPI:  58 y.o. M with hx congenital blindness, alcohol use disorder, alcohol associated pancreatitis, presented with 1 week abdominal pain, nausea and vomiting.  Has chronic abdominal discomfort, described as gastritis by PCP notes, possibly due to alcohol.  Also has a history of pancreatitic calcifications on CT and longstanding alcohol/tobacco use.  In the last week, his epigastric pain became severe, and he couldn't keep any food down, so he came to the hospital.  In the ER, electrolytes and renal function normal.  Lipase 120 and CT showed acute on chronic pancreatitis (edema superimposed on chronic calcifications)  Did not improves with IV opiates and anti-emetics, so hospitalist service asked to consult.      ROS   Past Medical History:  Diagnosis Date   Alcohol abuse    Anxiety    Arthritis    Blind in both eyes    sees lights and shadows   Elevated LFTs    Foot and toe(s), blister    bilat feet, blistering between toes   GERD (gastroesophageal reflux disease)    Glaucoma    Glaucoma as birth trauma    Headache(784.0)    Insomnia    Pancreatitis    UTI (lower urinary tract infection) 08/04/2011   finishing abx tomorrow   Weight loss    25 lb wt loss since 08/2010   Wrist fracture, right    Past Surgical History:  Procedure Laterality Date   COLONOSCOPY  2006?   ESOPHAGOGASTRODUODENOSCOPY     ESOPHAGOGASTRODUODENOSCOPY (EGD) WITH PROPOFOL  N/A 05/09/2017   Procedure: ESOPHAGOGASTRODUODENOSCOPY (EGD) WITH PROPOFOL ;  Surgeon: Legrand Victory LITTIE DOUGLAS, MD;  Location: [facility];  Service: Gastroenterology;  Laterality: N/A;   EYE SURGERY Left    EYE SURGERY Right    prostetic eye    foot srugery     removal of bone near small toe   FRACTURE SURGERY Right    wrist   HARDWARE REMOVAL  08/06/2011   Procedure: HARDWARE REMOVAL;  Surgeon: Jerona LULLA Sage, MD;  Location: [facility];  Service: Orthopedics;  Laterality: Right;  Removal Deep Retained Hardware Right Distal Radius   MULTIPLE TOOTH EXTRACTIONS     for dentures   TOTAL HIP ARTHROPLASTY Left 06/06/2019   Procedure: LEFT TOTAL HIP ARTHROPLASTY-DIRECT ANTERIOR;  Surgeon: Barbarann Oneil BROCKS, MD;  Location: [facility] OR;  Service: Orthopedics;  Laterality: Left;   TOTAL HIP ARTHROPLASTY Right 01/07/2020   Procedure: RIGHT TOTAL HIP ARTHROPLASTY DIRECT ANTERIOR;  Surgeon: Barbarann Oneil BROCKS, MD;  Location: [facility];  Service: Orthopedics;  Laterality: Right;   UPPER GASTROINTESTINAL ENDOSCOPY     Social History:  reports that he has been smoking cigars. He has never used smokeless tobacco. He reports current alcohol use. He reports that he does not currently use drugs after having used the following drugs: Marijuana. Frequency: 7.00 times per week.  Allergies  Allergen Reactions   Chlorhexidine  Rash and Other (See Comments)    Foley D/Ced this AM 01/08/2020    Family History  Problem Relation Age of Onset   Diabetes Sister    Diabetes Maternal Aunt        aunt and uncles   Hypertension Sister  aunt and uncles maternal side   Colon cancer Neg Hx    Colon polyps Neg Hx    Esophageal cancer Neg Hx    Rectal cancer Neg Hx    Stomach cancer Neg Hx     Prior to Admission medications   Medication Sig Start Date End Date Taking? Authorizing Provider  cyclobenzaprine  (FLEXERIL ) 10 MG tablet Take 10 mg by mouth 3 (three) times daily as needed for muscle spasms. 12/06/23  Yes [provider]  omeprazole  (PRILOSEC) 40 MG capsule Take 40 mg by mouth daily. 12/06/23  Yes [provider]  ondansetron  (ZOFRAN -ODT) 4 MG disintegrating tablet Take 1 tablet (4 mg total) by mouth every 6 (six) hours as needed for nausea or vomiting.  10/25/23  Yes Prosperi, Christian H, PA-C  diphenhydrAMINE  (BENADRYL ) 25 MG tablet Take 1 tablet (25 mg total) by mouth every 6 (six) hours as needed for up to 7 days. 07/17/23 07/24/23  Waddell Sluder, PA-C  gabapentin  (NEURONTIN ) 300 MG capsule SMARTSIG:1 Capsule(s) By Mouth Every Evening 07/07/22   [provider]  mirtazapine  (REMERON ) 7.5 MG tablet SMARTSIG:1 Tablet(s) By Mouth Every Evening 07/07/22   [provider]    Physical Exam: Vitals:   12/17/23 1512 12/17/23 1600 12/17/23 1730 12/17/23 1736  BP:  (!) 188/103 (!) 177/93   Pulse: (!) 58 61 66   Resp: 18  17   Temp:    98.6 F (37 C)  TempSrc:    Oral  SpO2: 98% 99% 96%   Weight:      Height:       Adult male, lying in bed, no acute distress, chronic opacification of left cornea, right eye appears enucleated Nose normal, edentulous, OP moist, no oral lesions RRR no murmurs no lower extremity edema, no JVD Respiratory rate normal, lungs clear without rales or wheezes Abdomen soft, tenderness throughout, worse in the epigastrium, planing of guarding, no rigidity or rebound Attention normal, affect appropriate, judgment and insight appear normal, oriented to person place and time, upper and lower extremity strength 4/5 but symmetric, speech fluent    Data Reviewed: Basic metabolic panel shows normal electrolytes and renal function CBC shows mild thrombocytopenia CT abdomen and pelvis shows acute on chronic pancreatitis Urinalysis normal Lipase level elevated      Assessment and Plan: * Acute pancreatitis Due to alcohol in setting of chronic pancreatitis. - NPO - May have noncaloric liquids - IV fluids - Analgesics and anti-emetics - Smoking and alcohol cessation recommended  -Patient is blind and lives alone, will have difficulty obtaining appropriate diet food after he leaves as he primarily subsist on [employer]    Thrombocytopenia (HCC) Mild, due to alcohol. - Alcohol cessation  recommended  Elevated blood pressure reading Denies history essential hypertension, just high BP with pain.  PCP notes show BPs in 120s-140s.   - PRN hydralazine   Alcohol use disorder, moderate, dependence (HCC) No evidence of withdrawal.  No prior complicated withdrawals.  PAWSS low.         Advance Care Planning: FULL  Consults: None  Family Communication: None  Severity of Illness: The appropriate patient status for this patient is INPATIENT. Inpatient status is judged to be reasonable and necessary in order to provide the required intensity of service to ensure the patient's safety. The patient's presenting symptoms, physical exam findings, and initial radiographic and laboratory data in the context of their chronic comorbidities is felt to place them at high risk for further clinical deterioration. Furthermore, it is  not anticipated that the patient will be medically stable for discharge from the hospital within 2 midnights of admission.   * I certify that at the point of admission it is my clinical judgment that the patient will require inpatient hospital care spanning beyond 2 midnights from the point of admission due to high intensity of service, high risk for further deterioration and high frequency of surveillance required.*  Author: Lonni SHAUNNA Dalton, MD 12/17/2023 5:58 PM  For on call review [email or web address].

## 2023-12-17 NOTE — Assessment & Plan Note (Signed)
 Mild, due to alcohol. - Alcohol cessation recommended

## 2023-12-17 NOTE — ED Provider Notes (Signed)
 [name] EMERGENCY DEPARTMENT AT [facility] Provider Note   CSN: [id number] Arrival date & time: 12/17/23  1326     Patient presents with: Abdominal Pain and Emesis   Austin [name] is a 58 y.o. male.   Patient with history of alcohol use, pancreatitis, blindness presents today with complaints of abdominal pain, nausea, and vomiting.  Reports that his pain began initially about 1 week ago.  Pain is epigastric in nature and feels similar to pancreatitis flares he has had previously.  Reports that he continues to drink alcohol about 5 days a week.  His last alcohol intake was Wednesday.  Denies any diarrhea, is passing flatus normally.  Denies any history of abdominal surgeries.  No urinary symptoms.  The history is provided by the patient. No language interpreter was used.  Abdominal Pain Associated symptoms: nausea and vomiting   Emesis Associated symptoms: abdominal pain        Prior to Admission medications   Medication Sig Start Date End Date Taking? Authorizing Provider  diphenhydrAMINE  (BENADRYL ) 25 MG tablet Take 1 tablet (25 mg total) by mouth every 6 (six) hours as needed for up to 7 days. 07/17/23 07/24/23  Waddell Sluder, PA-C  gabapentin  (NEURONTIN ) 300 MG capsule SMARTSIG:1 Capsule(s) By Mouth Every Evening 07/07/22   [provider]  mirtazapine  (REMERON ) 7.5 MG tablet SMARTSIG:1 Tablet(s) By Mouth Every Evening 07/07/22   [provider]  ondansetron  (ZOFRAN -ODT) 4 MG disintegrating tablet Take 1 tablet (4 mg total) by mouth every 6 (six) hours as needed for nausea or vomiting. 10/25/23   Prosperi, Christian H, PA-C  sucralfate  (CARAFATE ) 1 g tablet Take 1 tablet (1 g total) by mouth 4 (four) times daily -  with meals and at bedtime. 10/25/23   Prosperi, Christian H, PA-C    Allergies: Chlorhexidine     Review of Systems  Gastrointestinal:  Positive for abdominal pain, nausea and vomiting.  All other systems reviewed and are  negative.   Updated Vital Signs BP (!) 149/104 (BP Location: Left Arm)   Pulse 66   Temp 98.6 F (37 C) (Oral)   Resp 18   Ht 5' 7 (1.702 m)   Wt 68 kg   SpO2 100%   BMI 23.48 kg/m   Physical Exam Vitals and nursing note reviewed.  Constitutional:      General: He is not in acute distress.    Appearance: Normal appearance. He is normal weight. He is not ill-appearing, toxic-appearing or diaphoretic.  HENT:     Head: Normocephalic and atraumatic.  Cardiovascular:     Rate and Rhythm: Normal rate.  Pulmonary:     Effort: Pulmonary effort is normal. No respiratory distress.  Abdominal:     General: Abdomen is flat.     Palpations: Abdomen is soft.     Tenderness: There is abdominal tenderness in the epigastric area.  Musculoskeletal:        General: Normal range of motion.     Cervical back: Normal range of motion.  Skin:    General: Skin is warm and dry.  Neurological:     General: No focal deficit present.     Mental Status: He is alert.  Psychiatric:        Mood and Affect: Mood normal.        Behavior: Behavior normal.     (all labs ordered are listed, but only abnormal results are displayed) Labs Reviewed  COMPREHENSIVE METABOLIC PANEL WITH GFR - Abnormal; Notable for  the following components:      Result Value   Sodium 134 (*)    Glucose, Bld 141 (*)    All other components within normal limits  LIPASE, BLOOD - Abnormal; Notable for the following components:   Lipase 120 (*)    All other components within normal limits  CBC WITH DIFFERENTIAL/PLATELET - Abnormal; Notable for the following components:   Platelets 131 (*)    All other components within normal limits  URINALYSIS, ROUTINE W REFLEX MICROSCOPIC    EKG: None  Radiology: No results found.   Procedures   Medications Ordered in the ED  lactated ringers  bolus 1,000 mL (has no administration in time range)  ondansetron  (ZOFRAN ) injection 4 mg (has no administration in time range)   morphine  (PF) 4 MG/ML injection 4 mg (has no administration in time range)    Clinical Course as of 12/17/23 1543  Sat Dec 17, 2023  1519 Likely pancreatitis. Still drinking alcohol. Here for ab pain n/v. Could be a dc if pain is well controlled. But could admit for pancreatitis.  [JR]    Clinical Course User Index [JR] [name] Austin POUR, PA-C                                 Medical Decision Making Amount and/or Complexity of Data Reviewed Labs: ordered. Radiology: ordered.  Risk Prescription drug management.   This patient is a 58 y.o. male who presents to the ED for concern of abdominal pain, nausea, vomiting, this involves an extensive number of treatment options, and is a complaint that carries with it a high risk of complications and morbidity. The emergent differential diagnosis prior to evaluation includes, but is not limited to,  PUD, gastritis, pancreatitis, gastroparesis, malignancy, biliary disease, ACS, pericarditis, pneumonia, intestinal ischemia, esophageal rupture, hepatitis  This is not an exhaustive differential.   Past Medical History / Co-morbidities / Social History:  has a past medical history of Alcohol abuse, Anxiety, Arthritis, Blind in both eyes, Elevated LFTs, Foot and toe(s), blister, GERD (gastroesophageal reflux disease), Glaucoma, Glaucoma as birth trauma, Headache(784.0), Hypertension, Insomnia, Pancreatitis, UTI (lower urinary tract infection) (08/04/11), Weight loss, and Wrist fracture, right.  Additional history: Chart reviewed. Pertinent results include: previous ER visits and admissions for pancreatitis attributed to alcohol use  Physical Exam: Physical exam performed. The pertinent findings include: epigastric abdominal TTP, no rebound or guarding  Lab Tests: I ordered, and personally interpreted labs.  The pertinent results include:  Lipase 120   Imaging Studies: I ordered imaging studies including Ct abdomen pelvis. Imaging pending at shift  change  Medications: I ordered medication including fluids, morphine , zofran   for pain, nausea/vomiting. Reevaluation of the patient after these medicines is pending at shift change   Disposition:  Patient CT is pending at shift change and will determine disposition.  Given elevated lipase with patient's history of pancreatitis, likely symptoms related to same.  If uncomplicated on CT, plan to reassess to determine if he will need admission.  Care handoff to Austin Lang, PA-C at shift change.  Please see their note for continued evaluation and dispo.  Final diagnoses:  None    ED Discharge Orders     None          Nora Lauraine DELENA DEVONNA 12/17/23 1548    Laurice Maude BROCKS, MD 12/18/23 1600

## 2023-12-17 NOTE — ED Triage Notes (Signed)
 Pt arrived reporting history of pancreatitis due to alcohol. States he has been in pain for about a week, generalized abdominal pain with vomiting

## 2023-12-17 NOTE — Assessment & Plan Note (Signed)
 Due to alcohol in setting of chronic pancreatitis. - NPO - May have noncaloric liquids - IV fluids - Analgesics and anti-emetics - Smoking and alcohol cessation recommended

## 2023-12-17 NOTE — Assessment & Plan Note (Signed)
 No evidence of withdrawal.  No prior complicated withdrawals.  PAWSS low.

## 2023-12-17 NOTE — ED Provider Notes (Signed)
 Accepted handoff at shift change from Sarah Smoot PA-C. Please see prior provider note for more detail.   Briefly: Patient is 58 y.o. presenting for abdominal pain nausea and vomiting.  Has been gone for a week.  History of alcohol use and pancreatitis.  DDX: concern for PUD, gastritis, pancreatitis, gastroparesis, malignancy, biliary disease, ACS, pericarditis, pneumonia, intestinal ischemia, esophageal rupture, hepatitis   Plan: CT abdomen pelvis pending.  Will need to reassess pain.  Physical Exam  BP (!) 188/103   Pulse 61   Temp 98.6 F (37 C) (Oral)   Resp 18   Ht 5' 7 (1.702 m)   Wt 68 kg   SpO2 99%   BMI 23.48 kg/m   Physical Exam  Procedures  Procedures  ED Course / MDM   Clinical Course as of 12/17/23 1740  Sat Dec 17, 2023  1519 Likely pancreatitis. Still drinking alcohol. Here for ab pain n/v. Could be a dc if pain is well controlled. But could admit for pancreatitis.  [JR]    Clinical Course User Index [JR] Lang Norleen POUR, PA-C   Medical Decision Making Amount and/or Complexity of Data Reviewed Labs: ordered. Radiology: ordered.  Risk Prescription drug management. Decision regarding hospitalization.   CT showed findings suggestive of acute on chronic pancreatitis.  On reassessment, he was still in considerable pain.  Redosed morphine .  Admitted to hospital service with Dr. Jonel.       Lang Norleen POUR, PA-C 12/17/23 1743    Ula Prentice SAUNDERS, MD 12/17/23 [phone]

## 2023-12-17 NOTE — Assessment & Plan Note (Signed)
 Denies history essential hypertension, just high BP with pain.  PCP notes show BPs in 120s-140s.   - PRN hydralazine 

## 2023-12-18 DIAGNOSIS — K859 Acute pancreatitis without necrosis or infection, unspecified: Secondary | ICD-10-CM | POA: Diagnosis not present

## 2023-12-18 LAB — CBC
HCT: 42.7 % (ref 39.0–52.0)
Hemoglobin: 14.2 g/dL (ref 13.0–17.0)
MCH: 31.1 pg (ref 26.0–34.0)
MCHC: 33.3 g/dL (ref 30.0–36.0)
MCV: 93.4 fL (ref 80.0–100.0)
Platelets: 125 K/uL — ABNORMAL LOW (ref 150–400)
RBC: 4.57 MIL/uL (ref 4.22–5.81)
RDW: 13.1 % (ref 11.5–15.5)
WBC: 5.4 K/uL (ref 4.0–10.5)
nRBC: 0 % (ref 0.0–0.2)

## 2023-12-18 LAB — COMPREHENSIVE METABOLIC PANEL WITH GFR
ALT: 28 U/L (ref 0–44)
AST: 34 U/L (ref 15–41)
Albumin: 3.6 g/dL (ref 3.5–5.0)
Alkaline Phosphatase: 89 U/L (ref 38–126)
Anion gap: 12 (ref 5–15)
BUN: <5 mg/dL — ABNORMAL LOW (ref 6–20)
CO2: 25 mmol/L (ref 22–32)
Calcium: 8.9 mg/dL (ref 8.9–10.3)
Chloride: 92 mmol/L — ABNORMAL LOW (ref 98–111)
Creatinine, Ser: 0.77 mg/dL (ref 0.61–1.24)
GFR, Estimated: >60 mL/min (ref >60)
Glucose, Bld: 132 mg/dL — ABNORMAL HIGH (ref 70–99)
Potassium: 3.4 mmol/L — ABNORMAL LOW (ref 3.5–5.1)
Sodium: 129 mmol/L — ABNORMAL LOW (ref 135–145)
Total Bilirubin: 0.8 mg/dL (ref 0.0–1.2)
Total Protein: 7 g/dL (ref 6.5–8.1)

## 2023-12-18 MED ORDER — LORAZEPAM 1 MG PO TABS: 1.0000 mg | ORAL_TABLET | Freq: Four times a day (QID) | ORAL | Status: DC | PRN

## 2023-12-18 MED ORDER — LORAZEPAM 1 MG PO TABS: 1.0000 mg | ORAL_TABLET | ORAL | Status: DC | PRN

## 2023-12-18 MED ORDER — FOLIC ACID 1 MG PO TABS
1.0000 mg | ORAL_TABLET | Freq: Every day | ORAL | Status: DC
  Administered 2023-12-18 – 2023-12-20 (×3): 1 mg via ORAL
  Filled 2023-12-18 (×3): qty 1

## 2023-12-18 MED ORDER — ADULT MULTIVITAMIN W/MINERALS CH
1.0000 | ORAL_TABLET | Freq: Every day | ORAL | Status: DC
  Administered 2023-12-18 – 2023-12-20 (×3): 1 via ORAL
  Filled 2023-12-18 (×3): qty 1

## 2023-12-18 MED ORDER — THIAMINE MONONITRATE 100 MG PO TABS
100.0000 mg | ORAL_TABLET | Freq: Every day | ORAL | Status: DC
  Administered 2023-12-18 – 2023-12-20 (×3): 100 mg via ORAL
  Filled 2023-12-18 (×3): qty 1

## 2023-12-18 MED ORDER — THIAMINE HCL 100 MG/ML IJ SOLN
100.0000 mg | Freq: Every day | INTRAMUSCULAR | Status: DC
  Filled 2023-12-18: qty 2

## 2023-12-18 MED ORDER — LACTATED RINGERS IV SOLN: INTRAVENOUS | Status: AC

## 2023-12-18 MED ORDER — POTASSIUM CHLORIDE 10 MEQ/100ML IV SOLN
10.0000 meq | INTRAVENOUS | Status: AC
  Administered 2023-12-18 (×3): 10 meq via INTRAVENOUS
  Filled 2023-12-18 (×4): qty 100

## 2023-12-18 NOTE — Progress Notes (Addendum)
 PROGRESS NOTE  Austin [name] FMW:[id number] DOB: 1965-12-25 DOA: 12/17/2023 PCP: Pcp, No   LOS: 1 day   Brief narrative:  58 y.o. male with past medical history of congenital blindness, alcohol use disorder, alcohol associated pancreatitis, presented to the hospital with 1 week history of abdominal pain, nausea and vomiting.  Has baseline chronic abdominal discomfort described as gastritis by PCP likely secondary to alcohol and has history of pancreatic calcifications on CT scan likely chronic pancreatitis.  Patient also stated decreased oral tolerance.  In the ED, labs with unremarkable.  Lipase was elevated at 120 and CT scan showed acute on chronic pancreatitis with edema superimposed on chronic calcification.  Patient was then admitted hospital for further evaluation and treatment.   Assessment/Plan: Principal Problem:   Acute pancreatitis Active Problems:   Thrombocytopenia (HCC)   Alcohol use disorder, moderate, dependence (HCC)   Elevated blood pressure reading  Acute on chronic pancreatitis Secondary to oral alcohol use.  Continue antiemetics IV fluids analgesics and supportive care. Will start on clears. Add ativan  for mild withdrawal.  Counseled at length regarding quitting alcohol and he seems like he is motivated.   Thrombocytopenia  Mild and likely secondary to alcohol use.  Counseling done.   Elevated blood pressure reading No definite history of hypertension.  Continue to monitor.    Hypokalemia.  Potassium of 3.4.  Will replenish with IV potassium.  Check levels in AM.  Hyponatremia.  Likely secondary to alcohol abuse.  Continue with hydration.  Check levels in AM.  Sodium today at 129.   Alcohol use disorder, moderate, dependence with mild withdrawal. Complains of mild sweating.  No history of DTs or prior complicated withdrawals.  Will continue to monitor and use Add thiamine  folic acid  and start on CIWA protocol.  Nicotine  dependence.  Counseled about it.   Continue nicotine  patch.  Motivated to quit.   DVT prophylaxis: enoxaparin  (LOVENOX ) injection 40 mg Start: 12/17/23 2000   Disposition: home likely in 1-2 days.  Status is: Inpatient Remains inpatient appropriate because: acute pancreatitis, IV fluids, pending clinical improvement, CIWA protocol.    Code Status:     Code Status: Full Code  Family Communication: None   Consultants: None  Procedures: none  Anti-infectives:  none  Anti-infectives (From admission, onward)    None       Subjective: Today, patient was seen and examined at bedside. Has nausea, vomiting x 1, mild epigastric pain.  States that he wishes to try something by mouth.  Feels little sweaty and tremulous but no hallucinations.  Objective: Vitals:   12/18/23 0222 12/18/23 0529  BP: (!) 148/90 (!) 167/94  Pulse: 61 (!) 58  Resp: 17 17  Temp: 98 F (36.7 C) 98.2 F (36.8 C)  SpO2: 100% 100%    Intake/Output Summary (Last 24 hours) at 12/18/2023 0849 Last data filed at 12/18/2023 0500 Gross per 24 hour  Intake 1000 ml  Output 800 ml  Net 200 ml   Filed Weights   12/17/23 1330 12/17/23 1836  Weight: 68 kg 63.7 kg   Body mass index is 21.99 kg/m.   Physical Exam:  GENERAL: Patient is alert awake and oriented. Not in obvious distress. HENT: No scleral pallor or icterus. Pupils equally reactive to light. Oral mucosa is moist, blindness bilaterally, right eye prosthesis. NECK: is supple, no gross swelling noted. CHEST: Clear to auscultation. No crackles or wheezes.   CVS: S1 and S2 heard, no murmur. Regular rate and rhythm.  ABDOMEN: Soft,  epigastric tenderness on palpation.,  bowel sounds are present. EXTREMITIES: No edema. CNS: Cranial nerves are intact. No focal motor deficits. SKIN: warm and dry without rashes.  Data Review: I have personally reviewed the following laboratory data and studies,  CBC: Recent Labs  Lab 12/17/23 1444 12/18/23 0520  WBC 5.5 5.4  NEUTROABS 2.4   --   HGB 14.5 14.2  HCT 42.1 42.7  MCV 92.5 93.4  PLT 131* 125*   Basic Metabolic Panel: Recent Labs  Lab 12/17/23 1444 12/18/23 0520  NA 134* 129*  K 4.1 3.4*  CL 100 92*  CO2 26 25  GLUCOSE 141* 132*  BUN 9 <5*  CREATININE 0.98 0.77  CALCIUM  9.0 8.9   Liver Function Tests: Recent Labs  Lab 12/17/23 1444 12/18/23 0520  AST 38 34  ALT 31 28  ALKPHOS 96 89  BILITOT 0.8 0.8  PROT 7.0 7.0  ALBUMIN 3.9 3.6   Recent Labs  Lab 12/17/23 1444  LIPASE 120*   No results for input(s): AMMONIA in the last 168 hours. Cardiac Enzymes: No results for input(s): CKTOTAL, CKMB, CKMBINDEX, TROPONINI in the last 168 hours. BNP (last 3 results) No results for input(s): BNP in the last 8760 hours.  ProBNP (last 3 results) No results for input(s): PROBNP in the last 8760 hours.  CBG: No results for input(s): GLUCAP in the last 168 hours. No results found for this or any previous visit (from the past 240 hours).   Studies: CT ABDOMEN PELVIS W CONTRAST Result Date: 12/17/2023 CLINICAL DATA:  Pancreatitis, acute, severe Patient reports generalized abdominal pain and vomiting. EXAM: CT ABDOMEN AND PELVIS WITH CONTRAST TECHNIQUE: Multidetector CT imaging of the abdomen and pelvis was performed using the standard protocol following bolus administration of intravenous contrast. RADIATION DOSE REDUCTION: This exam was performed according to the departmental dose-optimization program which includes automated exposure control, adjustment of the mA and/or kV according to patient size and/or use of iterative reconstruction technique. CONTRAST:  OMNIPAQUE  IOHEXOL  300 MG/ML  SOLN COMPARISON:  Ultrasound 10/25/2023, CT 03/11/2022 FINDINGS: Lower chest: Scattered atelectasis in the lung bases. No confluent opacity. No pleural fluid. Hepatobiliary: Diffuse hepatic steatosis. No focal liver abnormality. Gallbladder physiologically distended, no calcified stone. No biliary dilatation.  Pancreas: Edema and fat stranding about the head and uncinate process of the pancreas. Homogeneous enhancement without evidence of necrosis. No acute peripancreatic collection. Calcifications in the pancreatic head consistent with chronic pancreatitis. No ductal dilatation. Pancreatic body and tail are unremarkable. Spleen: Normal in size without focal abnormality. Adrenals/Urinary Tract: Normal adrenal glands. No hydronephrosis or perinephric edema. Homogeneous renal enhancement with symmetric excretion on delayed phase imaging. Urinary bladder is physiologically distended without wall thickening. Stomach/Bowel: Patulous distal esophagus with mild wall thickening at the gastroesophageal junction. Air in fluid distends the stomach. Mild wall thickening about the distal stomach and duodenum adjacent to pancreatic inflammation. No small bowel obstruction or inflammatory change. Moderate stool within the cecum. Gaseous distension of transverse colon. No colonic inflammatory change. Vascular/Lymphatic: Aortic atherosclerosis. No aortic aneurysm. The portal, splenic, and mesenteric veins are patent. Small peripancreatic nodes, all subcentimeter short axis, typically reactive. Reproductive: Unremarkable prostate. Other: No significant free fluid or ascites. No focal fluid collection. No ascites. Musculoskeletal: Bilateral hip arthroplasties. There are no acute or suspicious osseous abnormalities. IMPRESSION: 1. Acute on chronic pancreatitis. No evidence of pancreatic necrosis or acute peripancreatic collection. 2. Hepatic steatosis. 3. Patulous distal esophagus with mild wall thickening at the gastroesophageal junction, can be seen with  reflux or esophagitis. Aortic Atherosclerosis (ICD10-I70.0). Electronically Signed   By: Andrea Gasman M.D.   On: 12/17/2023 16:53      Vernal Alstrom, MD  [facility] 12/18/2023  If 7PM-7AM, please contact night-coverage

## 2023-12-18 NOTE — Plan of Care (Signed)

## 2023-12-19 ENCOUNTER — Inpatient Hospital Stay (HOSPITAL_COMMUNITY)

## 2023-12-19 DIAGNOSIS — K859 Acute pancreatitis without necrosis or infection, unspecified: Secondary | ICD-10-CM | POA: Diagnosis not present

## 2023-12-19 LAB — COMPREHENSIVE METABOLIC PANEL WITH GFR
ALT: 28 U/L (ref 0–44)
AST: 33 U/L (ref 15–41)
Albumin: 3.8 g/dL (ref 3.5–5.0)
Alkaline Phosphatase: 88 U/L (ref 38–126)
Anion gap: 9 (ref 5–15)
BUN: 6 mg/dL (ref 6–20)
CO2: 26 mmol/L (ref 22–32)
Calcium: 9.2 mg/dL (ref 8.9–10.3)
Chloride: 95 mmol/L — ABNORMAL LOW (ref 98–111)
Creatinine, Ser: 0.96 mg/dL (ref 0.61–1.24)
GFR, Estimated: >60 mL/min (ref >60)
Glucose, Bld: 114 mg/dL — ABNORMAL HIGH (ref 70–99)
Potassium: 4 mmol/L (ref 3.5–5.1)
Sodium: 130 mmol/L — ABNORMAL LOW (ref 135–145)
Total Bilirubin: 1.2 mg/dL (ref 0.0–1.2)
Total Protein: 7 g/dL (ref 6.5–8.1)

## 2023-12-19 LAB — CBC
HCT: 42.5 % (ref 39.0–52.0)
Hemoglobin: 14.6 g/dL (ref 13.0–17.0)
MCH: 31.3 pg (ref 26.0–34.0)
MCHC: 34.4 g/dL (ref 30.0–36.0)
MCV: 91.2 fL (ref 80.0–100.0)
Platelets: 121 K/uL — ABNORMAL LOW (ref 150–400)
RBC: 4.66 MIL/uL (ref 4.22–5.81)
RDW: 13 % (ref 11.5–15.5)
WBC: 3.9 K/uL — ABNORMAL LOW (ref 4.0–10.5)
nRBC: 0 % (ref 0.0–0.2)

## 2023-12-19 LAB — LIPASE, BLOOD: Lipase: 49 U/L (ref 11–51)

## 2023-12-19 LAB — MAGNESIUM: Magnesium: 1.8 mg/dL (ref 1.7–2.4)

## 2023-12-19 MED ORDER — LACTATED RINGERS IV SOLN: INTRAVENOUS | Status: AC

## 2023-12-19 MED ORDER — HYDRALAZINE HCL 20 MG/ML IJ SOLN
10.0000 mg | INTRAMUSCULAR | Status: DC | PRN
  Administered 2023-12-20: 10 mg via INTRAVENOUS
  Filled 2023-12-19: qty 1

## 2023-12-19 MED ORDER — METHOCARBAMOL 1000 MG/10ML IJ SOLN
500.0000 mg | Freq: Three times a day (TID) | INTRAMUSCULAR | Status: DC | PRN
  Administered 2023-12-19 – 2023-12-20 (×3): 500 mg via INTRAVENOUS
  Filled 2023-12-19 (×3): qty 10

## 2023-12-19 MED ORDER — BISACODYL 10 MG RE SUPP
10.0000 mg | Freq: Once | RECTAL | Status: AC
  Administered 2023-12-19: 10 mg via RECTAL
  Filled 2023-12-19: qty 1

## 2023-12-19 NOTE — Plan of Care (Signed)

## 2023-12-19 NOTE — Progress Notes (Signed)
 PROGRESS NOTE  Austin [name] FMW:[id number] DOB: March 21, 1966 DOA: 12/17/2023 PCP: Pcp, No   LOS: 2 days   Brief narrative:  58 y.o. male with past medical history of congenital blindness, alcohol use disorder, alcohol associated pancreatitis, presented to the hospital with 1 week history of abdominal pain, nausea and vomiting.  Has baseline chronic abdominal discomfort described as gastritis by PCP likely secondary to alcohol and has history of pancreatic calcifications on CT scan likely chronic pancreatitis.  Patient also stated decreased oral tolerance.  In the ED, labs with unremarkable.  Lipase was elevated at 120 and CT scan showed acute on chronic pancreatitis with edema superimposed on chronic calcification.  Patient was then admitted hospital for further evaluation and treatment.   Assessment/Plan: Principal Problem:   Acute pancreatitis Active Problems:   Thrombocytopenia (HCC)   Alcohol use disorder, moderate, dependence (HCC)   Elevated blood pressure reading  Acute on chronic pancreatitis with ongoing abdominal pain. Secondary to oral alcohol use.  Continue antiemetics IV fluids analgesics and supportive care.  Patient is on clears and still has some nausea.  Will continue with clears.  Alcohol cessation is emphasized.  Lipase has improved to 49.  Right abdominal wall pain.  Looks like muscle spasm.  Has not had a bowel movement in few days.  Will try Dulcolax suppository.  Add methocarbamol  for muscle spasm.  Obtain abdominal x-ray.  Thrombocytopenia  Mild and likely secondary to alcohol use.  Counseling done.   Elevated blood pressure reading No definite history of hypertension.  Will add hydralazine .  Seems to be in quite some pain.  Hypokalemia.  Improved after replacement.  Latest potassium of 4.0.  Hyponatremia.  Likely secondary to alcohol abuse.  Continue with hydration.  Check levels in AM.  Sodium today at 130.   Alcohol use disorder, moderate, dependence with  mild withdrawal. Complains of mild sweating.  No history of DTs or prior complicated withdrawals.  Will continue to monitor and continue thiamine  folic acid , CIWA protocol.  Nicotine  dependence.  Continue nicotine  patch.   DVT prophylaxis: enoxaparin  (LOVENOX ) injection 40 mg Start: 12/17/23 2000   Disposition: home likely in 1-2 days.  Status is: Inpatient Remains inpatient appropriate because: acute pancreatitis,pending clinical improvement, CIWA protocol.    Code Status:     Code Status: Full Code  Family Communication: None   Consultants: None  Procedures: none  Anti-infectives:  none  Anti-infectives (From admission, onward)    None       Subjective: Today, patient was seen and examined at bedside.  Patient complains of mild nausea but able to tolerate clears.  Complains of new pain over the right abdominal wall on the lateral side.  Has not had a bowel movement for 3 to 4 days.  Denies any shortness of breath dyspnea or chest pain.  Objective: Vitals:   12/18/23 2030 12/19/23 0416  BP: (!) 166/96 (!) 166/100  Pulse: 75 69  Resp: 20 20  Temp:    SpO2: 100% 98%    Intake/Output Summary (Last 24 hours) at 12/19/2023 0943 Last data filed at 12/19/2023 0800 Gross per 24 hour  Intake 1800.22 ml  Output 3450 ml  Net -1649.78 ml   Filed Weights   12/17/23 1330 12/17/23 1836  Weight: 68 kg 63.7 kg   Body mass index is 21.99 kg/m.   Physical Exam:  GENERAL: Patient is alert awake and oriented. Not in obvious distress. HENT: No scleral pallor or icterus. Pupils equally reactive to light. Oral  mucosa is moist, blindness bilaterally, right eye prosthesis. NECK: is supple, no gross swelling noted. CHEST: Clear to auscultation. No crackles or wheezes.   CVS: S1 and S2 heard, no murmur. Regular rate and rhythm.  ABDOMEN: Soft, epigastric tenderness on palpation.  Mild muscle spasm and tenderness over the right abdominal wall on the lateral aspect.,  bowel  sounds are present. EXTREMITIES: No edema. CNS: Cranial nerves are intact. No focal motor deficits. SKIN: warm and dry without rashes.  Data Review: I have personally reviewed the following laboratory data and studies,  CBC: Recent Labs  Lab 12/17/23 1444 12/18/23 0520 12/19/23 0411  WBC 5.5 5.4 3.9*  NEUTROABS 2.4  --   --   HGB 14.5 14.2 14.6  HCT 42.1 42.7 42.5  MCV 92.5 93.4 91.2  PLT 131* 125* 121*   Basic Metabolic Panel: Recent Labs  Lab 12/17/23 1444 12/18/23 0520 12/19/23 0411  NA 134* 129* 130*  K 4.1 3.4* 4.0  CL 100 92* 95*  CO2 26 25 26   GLUCOSE 141* 132* 114*  BUN 9 <5* 6  CREATININE 0.98 0.77 0.96  CALCIUM  9.0 8.9 9.2  MG  --   --  1.8   Liver Function Tests: Recent Labs  Lab 12/17/23 1444 12/18/23 0520 12/19/23 0411  AST 38 34 33  ALT 31 28 28   ALKPHOS 96 89 88  BILITOT 0.8 0.8 1.2  PROT 7.0 7.0 7.0  ALBUMIN 3.9 3.6 3.8   Recent Labs  Lab 12/17/23 1444 12/19/23 0411  LIPASE 120* 49   No results for input(s): AMMONIA in the last 168 hours. Cardiac Enzymes: No results for input(s): CKTOTAL, CKMB, CKMBINDEX, TROPONINI in the last 168 hours. BNP (last 3 results) No results for input(s): BNP in the last 8760 hours.  ProBNP (last 3 results) No results for input(s): PROBNP in the last 8760 hours.  CBG: No results for input(s): GLUCAP in the last 168 hours. No results found for this or any previous visit (from the past 240 hours).   Studies: CT ABDOMEN PELVIS W CONTRAST Result Date: 12/17/2023 CLINICAL DATA:  Pancreatitis, acute, severe Patient reports generalized abdominal pain and vomiting. EXAM: CT ABDOMEN AND PELVIS WITH CONTRAST TECHNIQUE: Multidetector CT imaging of the abdomen and pelvis was performed using the standard protocol following bolus administration of intravenous contrast. RADIATION DOSE REDUCTION: This exam was performed according to the departmental dose-optimization program which includes automated  exposure control, adjustment of the mA and/or kV according to patient size and/or use of iterative reconstruction technique. CONTRAST:  OMNIPAQUE  IOHEXOL  300 MG/ML  SOLN COMPARISON:  Ultrasound 10/25/2023, CT 03/11/2022 FINDINGS: Lower chest: Scattered atelectasis in the lung bases. No confluent opacity. No pleural fluid. Hepatobiliary: Diffuse hepatic steatosis. No focal liver abnormality. Gallbladder physiologically distended, no calcified stone. No biliary dilatation. Pancreas: Edema and fat stranding about the head and uncinate process of the pancreas. Homogeneous enhancement without evidence of necrosis. No acute peripancreatic collection. Calcifications in the pancreatic head consistent with chronic pancreatitis. No ductal dilatation. Pancreatic body and tail are unremarkable. Spleen: Normal in size without focal abnormality. Adrenals/Urinary Tract: Normal adrenal glands. No hydronephrosis or perinephric edema. Homogeneous renal enhancement with symmetric excretion on delayed phase imaging. Urinary bladder is physiologically distended without wall thickening. Stomach/Bowel: Patulous distal esophagus with mild wall thickening at the gastroesophageal junction. Air in fluid distends the stomach. Mild wall thickening about the distal stomach and duodenum adjacent to pancreatic inflammation. No small bowel obstruction or inflammatory change. Moderate stool within the cecum.  Gaseous distension of transverse colon. No colonic inflammatory change. Vascular/Lymphatic: Aortic atherosclerosis. No aortic aneurysm. The portal, splenic, and mesenteric veins are patent. Small peripancreatic nodes, all subcentimeter short axis, typically reactive. Reproductive: Unremarkable prostate. Other: No significant free fluid or ascites. No focal fluid collection. No ascites. Musculoskeletal: Bilateral hip arthroplasties. There are no acute or suspicious osseous abnormalities. IMPRESSION: 1. Acute on chronic pancreatitis. No  evidence of pancreatic necrosis or acute peripancreatic collection. 2. Hepatic steatosis. 3. Patulous distal esophagus with mild wall thickening at the gastroesophageal junction, can be seen with reflux or esophagitis. Aortic Atherosclerosis (ICD10-I70.0). Electronically Signed   By: Andrea Gasman M.D.   On: 12/17/2023 16:53      Vernal Alstrom, MD  [facility] 12/19/2023  If 7PM-7AM, please contact night-coverage

## 2023-12-19 NOTE — TOC Initial Note (Signed)
 Transition of Care [facility]) - Initial/Assessment Note    Patient Details  Name: Austin [name] MRN: [id number] Date of Birth: Jan 10, 1966  [facility] CM/SW Contact:    Doneta Glenys DASEN, RN Phone Number: 12/19/2023, 11:21 AM  Clinical Narrative:                 Case Manager meet with patient in the room. PTA patient states lives in a house alone; Verified PCP is [place], [place] with [employer] on [address], and insurance; DME-cane, walker. [place]; No HH, oxygen ; Patient will have a friend or [employer] to transport home at discharge.  Patient is agreeable to substance abuse resources being added to AVS.Patient stated that he will get a friend or family member to read information. Patient has requested a new shower chair. CM will send out the request for shower chair to DME agency.  Expected Discharge Plan: Home/Self Care Barriers to Discharge: Continued Medical Work up   Patient Goals and CMS Choice Patient states their goals for this hospitalization and ongoing recovery are:: Home alone CMS Medicare.gov Compare Post Acute Care list provided to::  (NA) Choice offered to / list presented to : NA [place] ownership interest in [facility].provided to:: Parent NA    Expected Discharge Plan and Services In-house Referral: NA Discharge Planning Services: CM Consult Post Acute Care Choice: NA Living arrangements for the past 2 months: Single Family Home                 DME Arranged: N/A DME Agency: NA       HH Arranged: NA HH Agency: NA        Prior Living Arrangements/Services Living arrangements for the past 2 months: Single Family Home Lives with:: Self Patient language and need for interpreter reviewed:: Yes Do you feel safe going back to the place where you live?: Yes      Need for Family Participation in Patient Care: No (Comment) Care giver support system in place?: Yes (comment) Current home services: DME (cane, walker, [place]) Criminal  Activity/Legal Involvement Pertinent to Current Situation/Hospitalization: No - Comment as needed  Activities of Daily Living   ADL Screening (condition at time of admission) Independently performs ADLs?: Yes (appropriate for developmental age) Is the patient deaf or have difficulty hearing?: No Does the patient have difficulty seeing, even when wearing glasses/contacts?: Yes Does the patient have difficulty concentrating, remembering, or making decisions?: Yes  Permission Sought/Granted Permission sought to share information with : Case Manager Permission granted to share information with : Yes, Verbal Permission Granted              Emotional Assessment Appearance:: Appears stated age Attitude/Demeanor/Rapport: Engaged Affect (typically observed): Appropriate Orientation: : Oriented to Self, Oriented to Place, Oriented to  Time, Oriented to Situation Alcohol / Substance Use: Alcohol Use (Will ad resources to AVS) Psych Involvement: No (comment)  Admission diagnosis:  Acute pancreatitis [K85.90] Patient Active Problem List   Diagnosis Date Noted   Elevated blood pressure reading 12/17/2023   Pain due to onychomycosis of toenails of both feet 07/26/2022   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   Alcohol abuse 07/01/2022   Alcohol use disorder, severe, dependence (HCC) 07/01/2022   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   Alcohol abuse, daily use 03/12/2022   Alcohol use disorder, moderate, dependence (HCC) 03/12/2022   Alcohol intoxication with moderate or severe use disorder, uncomplicated (HCC) 03/04/2022   Hypomagnesemia 01/12/2022   Hypokalemia 01/12/2022  Alcohol withdrawal syndrome without complication (HCC) 09/07/2021   Thrombocytopenia (HCC) 09/07/2021   Elevated LFTs 09/07/2021   Passive suicidal ideations 09/07/2021   Acute alcoholic pancreatitis 08/22/2020   Hx of total hip arthroplasty, 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   History of total hip arthroplasty,  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   Acute pancreatitis 10/05/2019   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   Intractable nausea and vomiting 12/29/2018   Alcoholic pancreatitis 04/14/2018   Essential hypertension 04/14/2018   Blindness of both eyes 04/14/2018   Alcohol use 04/14/2018   Hyponatremia 04/14/2018   RUQ pain    Abnormal CT of the abdomen    History of corneal transplant 10/19/2012   Retained orthopedic hardware 08/07/2011    Class: Chronic   PCP:  Pcp, No Pharmacy:   [facility] [place], [place] - [address] [place] [address] Phone: [phone] Fax: [phone]  [facility] [address] [place] [address] Phone: [phone] Fax: [phone]     Social Drivers of Health (SDOH) Social History: SDOH Screenings   Food Insecurity: No Food Insecurity (12/17/2023)  Housing: Low Risk  (12/17/2023)  Transportation Needs: No Transportation Needs (12/17/2023)  Utilities: Not At Risk (12/17/2023)  Depression (PHQ2-9): Low Risk  (10/21/2022)  Recent Concern: Depression (PHQ2-9) - Medium Risk (08/10/2022)  Financial Resource Strain: Low Risk  (12/06/2023)   Received from [facility]  Tobacco Use: High Risk (12/17/2023)   SDOH Interventions:     Readmission Risk Interventions    12/19/2023   11:06 AM  Readmission Risk Prevention Plan  Post Dischage Appt Complete  Medication Screening Complete  Transportation Screening Complete

## 2023-12-19 NOTE — Plan of Care (Signed)
  Problem: Education: Goal: Knowledge of General Education information will improve Description: Including pain rating scale, medication(s)/side effects and non-pharmacologic comfort measures Outcome: Progressing   Problem: Health Behavior/Discharge Planning: Goal: Ability to manage health-related needs will improve Outcome: Progressing   Problem: Clinical Measurements: Goal: Ability to maintain clinical measurements within normal limits will improve Outcome: Progressing Goal: Will remain free from infection Outcome: Progressing Goal: Diagnostic test results will improve Outcome: Progressing Goal: Respiratory complications will improve Outcome: Progressing Goal: Cardiovascular complication will be avoided Outcome: Progressing   Problem: Activity: Goal: Risk for activity intolerance will decrease Outcome: Progressing   Problem: Nutrition: Goal: Adequate nutrition will be maintained Outcome: Progressing   Problem: Coping: Goal: Level of anxiety will decrease Outcome: Progressing   Problem: Elimination: Goal: Will not experience complications related to urinary retention Outcome: Progressing   Problem: Skin Integrity: Goal: Risk for impaired skin integrity will decrease Outcome: Progressing

## 2023-12-20 DIAGNOSIS — K859 Acute pancreatitis without necrosis or infection, unspecified: Secondary | ICD-10-CM | POA: Diagnosis not present

## 2023-12-20 LAB — COMPREHENSIVE METABOLIC PANEL WITH GFR
ALT: 29 U/L (ref 0–44)
AST: 46 U/L — ABNORMAL HIGH (ref 15–41)
Albumin: 3.8 g/dL (ref 3.5–5.0)
Alkaline Phosphatase: 85 U/L (ref 38–126)
Anion gap: 12 (ref 5–15)
BUN: 8 mg/dL (ref 6–20)
CO2: 26 mmol/L (ref 22–32)
Calcium: 9.5 mg/dL (ref 8.9–10.3)
Chloride: 92 mmol/L — ABNORMAL LOW (ref 98–111)
Creatinine, Ser: 0.97 mg/dL (ref 0.61–1.24)
GFR, Estimated: >60 mL/min (ref >60)
Glucose, Bld: 99 mg/dL (ref 70–99)
Potassium: 3.7 mmol/L (ref 3.5–5.1)
Sodium: 130 mmol/L — ABNORMAL LOW (ref 135–145)
Total Bilirubin: 0.9 mg/dL (ref 0.0–1.2)
Total Protein: 7.3 g/dL (ref 6.5–8.1)

## 2023-12-20 LAB — CBC
HCT: 40.6 % (ref 39.0–52.0)
Hemoglobin: 14 g/dL (ref 13.0–17.0)
MCH: 31.5 pg (ref 26.0–34.0)
MCHC: 34.5 g/dL (ref 30.0–36.0)
MCV: 91.2 fL (ref 80.0–100.0)
Platelets: 121 K/uL — ABNORMAL LOW (ref 150–400)
RBC: 4.45 MIL/uL (ref 4.22–5.81)
RDW: 12.8 % (ref 11.5–15.5)
WBC: 4.2 K/uL (ref 4.0–10.5)
nRBC: 0 % (ref 0.0–0.2)

## 2023-12-20 LAB — MAGNESIUM: Magnesium: 1.8 mg/dL (ref 1.7–2.4)

## 2023-12-20 MED ORDER — NICOTINE 14 MG/24HR TD PT24
14.0000 mg | MEDICATED_PATCH | TRANSDERMAL | 0 refills | Status: DC
Start: 2023-12-20 — End: 2024-04-08

## 2023-12-20 MED ORDER — ADULT MULTIVITAMIN W/MINERALS CH: 1.0000 | ORAL_TABLET | Freq: Every day | ORAL | Status: AC

## 2023-12-20 MED ORDER — AMLODIPINE BESYLATE 10 MG PO TABS: 10.0000 mg | ORAL_TABLET | Freq: Every day | ORAL | 11 refills | Status: DC

## 2023-12-20 MED ORDER — OXYCODONE HCL 5 MG PO TABS: 5.0000 mg | ORAL_TABLET | Freq: Four times a day (QID) | ORAL | 0 refills | Status: DC | PRN

## 2023-12-20 MED ORDER — VITAMIN B-1 100 MG PO TABS: 100.0000 mg | ORAL_TABLET | Freq: Every day | ORAL | 0 refills | Status: AC

## 2023-12-20 MED ORDER — FOLIC ACID 1 MG PO TABS: 1.0000 mg | ORAL_TABLET | Freq: Every day | ORAL | 0 refills | Status: AC

## 2023-12-20 NOTE — Discharge Summary (Signed)
 Physician Discharge Summary  Austin [name] FMW:[id number] DOB: May 22, 1966 DOA: 12/17/2023  PCP: Austin Penne DASEN, PA-C  Admit date: 12/17/2023 Discharge date: 12/20/2023  Admitted From: Home  Discharge disposition: Home   Recommendations for Outpatient Follow-Up:   Follow up with your primary care provider in one week.  Check CBC, CMP, magnesium  in the next visit Patient should be encouraged to quit alcohol and smoking.[name]   Discharge Diagnosis:   Principal Problem:   Acute pancreatitis Active Problems:   Thrombocytopenia (HCC)   Alcohol use disorder, moderate, dependence (HCC)   Elevated blood pressure reading    Discharge Condition: Improved.  Diet recommendation: Soft low-fat diet  Wound care: None.  Code status: Full.   History of Present Illness:   58 y.o. male with past medical history of congenital blindness, alcohol use disorder, alcohol associated pancreatitis, presented to the hospital with 1 week history of abdominal pain, nausea and vomiting. Has baseline chronic abdominal discomfort described as gastritis by PCP likely secondary to alcohol and has history of pancreatic calcifications on CT scan likely chronic pancreatitis. Patient also stated decreased oral tolerance. In the ED, labs with unremarkable. Lipase was elevated at 120 and CT scan showed acute on chronic pancreatitis with edema superimposed on chronic calcification. Patient was then admitted hospital for further evaluation and treatment.    Hospital Course:   Following conditions were addressed during hospitalization as listed below,  Acute on chronic pancreatitis  Secondary to oral alcohol use.  During hospitalization patient received antiemetics IV fluids analgesics and supportive care.   Lipase has improved to 49.  At this time patient has tolerated oral diet and has minimal pain and feels stable for disposition home.  Patient was extensively counseled regarding quitting alcohol.  He seems to  be motivated at this time.   Right abdominal wall pain.  Looks like muscle spasm.  Continue muscle relaxant.  Abdominal x-ray was negative for acute findings.   Thrombocytopenia  Mild and likely secondary to alcohol use.  Counseling done.  Latest platelet of 121   Elevated blood pressure reading likely hypertension.   Will start the patient on low-dose amlodipine  since he has remained high during the whole hospitalization.  Will need to titrate as outpatient with PCP.   Hypokalemia.  Improved after replacement.  Latest potassium of 3.7   Hyponatremia.  Likely secondary to alcohol abuse.  Received IV hydration during hospitalization.  Sodium prior to discharge was 130.  Recommend outpatient monitoring.   Alcohol use disorder, moderate, dependence with mild withdrawal. Patient had some anxiety and sweating which has resolved at this time.  Was on CIWA protocol.  Will continue thiamine  folic acid , and multivitamins on discharge.   Nicotine  dependence.  Continue nicotine  patch on discharge  Disposition.  At this time, patient is stable for disposition home with outpatient PCP follow-up  Medical Consultants:   None.  Procedures:    None Subjective:   Today, patient was seen and examined at bedside.  Patient complains of mild nausea but able to tolerate clears.  Complains of new pain over the right abdominal wall on the lateral side.  Has not had a bowel movement for 3 to 4 days.  Denies any shortness of breath dyspnea or chest pain.    Discharge Exam:   Vitals:   12/20/23 1316 12/20/23 1343  BP: (!) 150/88 (!) 167/95  Pulse: 91 (!) 103  Resp:    Temp:    SpO2:  100%   Vitals:  12/20/23 0613 12/20/23 1159 12/20/23 1316 12/20/23 1343  BP: (!) 159/95 (!) 174/103 (!) 150/88 (!) 167/95  Pulse: 64 67 91 (!) 103  Resp: 18 18    Temp: 98 F (36.7 C) 98.1 F (36.7 C)    TempSrc:  Oral    SpO2: 100% 97%  100%  Weight:      Height:       Body mass index is 21.99 kg/m.   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   No crackles or wheezes.  CVS: S1 &S2 heard. No murmur.  Regular rate and rhythm. Abdomen: Soft, nontender, nondistended.  Bowel sounds are heard.   Extremities: No cyanosis, clubbing or edema.  Peripheral pulses are palpable. Psych: Alert, awake and oriented, normal mood CNS:  No cranial nerve deficits.  Moves all extremities. Skin: Warm and dry.  No rashes noted.  The results of significant diagnostics from this hospitalization (including imaging, microbiology, ancillary and laboratory) are listed below for reference.     Diagnostic Studies:   CT ABDOMEN PELVIS W CONTRAST Result Date: 12/17/2023 CLINICAL DATA:  Pancreatitis, acute, severe Patient reports generalized abdominal pain and vomiting. EXAM: CT ABDOMEN AND PELVIS WITH CONTRAST TECHNIQUE: Multidetector CT imaging of the abdomen and pelvis was performed using the standard protocol following bolus administration of intravenous contrast. RADIATION DOSE REDUCTION: This exam was performed according to the departmental dose-optimization program which includes automated exposure control, adjustment of the mA and/or kV according to patient size and/or use of iterative reconstruction technique. CONTRAST:  OMNIPAQUE  IOHEXOL  300 MG/ML  SOLN COMPARISON:  Ultrasound 10/25/2023, CT 03/11/2022 FINDINGS: Lower chest: Scattered atelectasis in the lung bases. No confluent opacity. No pleural fluid. Hepatobiliary: Diffuse hepatic steatosis. No focal liver abnormality. Gallbladder physiologically distended, no calcified stone. No biliary dilatation. Pancreas: Edema and fat stranding about the head and uncinate process of the pancreas. Homogeneous enhancement without evidence of necrosis. No acute peripancreatic collection. Calcifications in the pancreatic head consistent with  chronic pancreatitis. No ductal dilatation. Pancreatic body and tail are unremarkable. Spleen: Normal in size without focal abnormality. Adrenals/Urinary Tract: Normal adrenal glands. No hydronephrosis or perinephric edema. Homogeneous renal enhancement with symmetric excretion on delayed phase imaging. Urinary bladder is physiologically distended without wall thickening. Stomach/Bowel: Patulous distal esophagus with mild wall thickening at the gastroesophageal junction. Air in fluid distends the stomach. Mild wall thickening about the distal stomach and duodenum adjacent to pancreatic inflammation. No small bowel obstruction or inflammatory change. Moderate stool within the cecum. Gaseous distension of transverse colon. No colonic inflammatory change. Vascular/Lymphatic: Aortic atherosclerosis. No aortic aneurysm. The portal, splenic, and mesenteric veins are patent. Small peripancreatic nodes, all subcentimeter short axis, typically reactive. Reproductive: Unremarkable prostate. Other: No significant free fluid or ascites. No focal fluid collection. No ascites. Musculoskeletal: Bilateral hip arthroplasties. There are no acute or suspicious osseous abnormalities. IMPRESSION: 1. Acute on chronic pancreatitis. No evidence of pancreatic necrosis or acute peripancreatic collection. 2. Hepatic steatosis. 3. Patulous distal esophagus with mild wall thickening at the gastroesophageal junction, can be seen with reflux or esophagitis. Aortic Atherosclerosis (ICD10-I70.0). Electronically Signed   By: Andrea Gasman M.D.   On: 12/17/2023 16:53     Labs:   Basic Metabolic Panel: Recent Labs  Lab 12/17/23 1444 12/18/23 0520 12/19/23 0411 12/20/23 0209  NA 134* 129* 130* 130*  K 4.1 3.4* 4.0 3.7  CL 100 92*  95* 92*  CO2 26 25 26 26   GLUCOSE 141* 132* 114* 99  BUN 9 <5* 6 8  CREATININE 0.98 0.77 0.96 0.97  CALCIUM  9.0 8.9 9.2 9.5  MG  --   --  1.8 1.8   GFR Estimated Creatinine Clearance: 74.8 mL/min (by  C-G formula based on SCr of 0.97 mg/dL). Liver Function Tests: Recent Labs  Lab 12/17/23 1444 12/18/23 0520 12/19/23 0411 12/20/23 0209  AST 38 34 33 46*  ALT 31 28 28 29   ALKPHOS 96 89 88 85  BILITOT 0.8 0.8 1.2 0.9  PROT 7.0 7.0 7.0 7.3  ALBUMIN 3.9 3.6 3.8 3.8   Recent Labs  Lab 12/17/23 1444 12/19/23 0411  LIPASE 120* 49   No results for input(s): AMMONIA in the last 168 hours. Coagulation profile No results for input(s): INR, PROTIME in the last 168 hours.  CBC: Recent Labs  Lab 12/17/23 1444 12/18/23 0520 12/19/23 0411 12/20/23 0209  WBC 5.5 5.4 3.9* 4.2  NEUTROABS 2.4  --   --   --   HGB 14.5 14.2 14.6 14.0  HCT 42.1 42.7 42.5 40.6  MCV 92.5 93.4 91.2 91.2  PLT 131* 125* 121* 121*   Cardiac Enzymes: No results for input(s): CKTOTAL, CKMB, CKMBINDEX, TROPONINI in the last 168 hours. BNP: Invalid input(s): POCBNP CBG: No results for input(s): GLUCAP in the last 168 hours. D-Dimer No results for input(s): DDIMER in the last 72 hours. Hgb A1c No results for input(s): HGBA1C in the last 72 hours. Lipid Profile No results for input(s): CHOL, HDL, LDLCALC, TRIG, CHOLHDL, LDLDIRECT in the last 72 hours. Thyroid  function studies No results for input(s): TSH, T4TOTAL, T3FREE, THYROIDAB in the last 72 hours.  Invalid input(s): FREET3 Anemia work up No results for input(s): VITAMINB12, FOLATE, FERRITIN, TIBC, IRON, RETICCTPCT in the last 72 hours. Microbiology No results found for this or any previous visit (from the past 240 hours).   Discharge Instructions:   Discharge Instructions     Call MD for:  persistant nausea and vomiting   Complete by: As directed    Call MD for:  severe uncontrolled pain   Complete by: As directed    Discharge instructions   Complete by: As directed    Follow-up with your primary care provider in 1 week.  Please do not drink alcohol.  Nicotine  patch has been  prescribed to help with quitting smoking.  Seek medical attention for worsening symptoms.   Increase activity slowly   Complete by: As directed       Allergies as of 12/20/2023       Reactions   Chlorhexidine  Rash, Other (See Comments)   Foley D/Ced this AM 01/08/2020        Medication List     STOP taking these medications    diphenhydrAMINE  25 MG tablet Commonly known as: BENADRYL        TAKE these medications    amLODipine  10 MG tablet Commonly known as: NORVASC  Take 1 tablet (10 mg total) by mouth daily.   cyclobenzaprine  10 MG tablet Commonly known as: FLEXERIL  Take 10 mg by mouth 3 (three) times daily as needed for muscle spasms.   folic acid  1 MG tablet Commonly known as: FOLVITE  Take 1 tablet (1 mg total) by mouth daily. Start taking on: December 21, 2023   multivitamin with minerals Tabs tablet Take 1 tablet by mouth daily. Start taking on: December 21, 2023   nicotine  14 mg/24hr patch Commonly known as: NICODERM CQ  -  dosed in mg/24 hours Place 1 patch (14 mg total) onto the skin daily.   omeprazole  40 MG capsule Commonly known as: PRILOSEC Take 40 mg by mouth daily.   ondansetron  4 MG disintegrating tablet Commonly known as: ZOFRAN -ODT Take 1 tablet (4 mg total) by mouth every 6 (six) hours as needed for nausea or vomiting.   oxyCODONE  5 MG immediate release tablet Commonly known as: Oxy IR/ROXICODONE  Take 1 tablet (5 mg total) by mouth every 6 (six) hours as needed for moderate pain (pain score 4-6) or severe pain (pain score 7-10).   thiamine  100 MG tablet Commonly known as: Vitamin B-1 Take 1 tablet (100 mg total) by mouth daily. Start taking on: December 21, 2023        Follow-up Information     McCoy, Penne [name], [place] Follow up in 1 week(s).   Specialty: Physician Assistant Contact information: [address] [place] [address] [phone]                  Time coordinating discharge: 39  minutes  Signed:  Hana Trippett  [facility] 12/20/2023, 3:37 PM

## 2024-01-27 ENCOUNTER — Ambulatory Visit: Admitting: Physician Assistant

## 2024-04-06 ENCOUNTER — Other Ambulatory Visit: Payer: Self-pay

## 2024-04-06 ENCOUNTER — Emergency Department (HOSPITAL_COMMUNITY)

## 2024-04-06 ENCOUNTER — Inpatient Hospital Stay (HOSPITAL_COMMUNITY)
Admission: EM | Admit: 2024-04-06 | Discharge: 2024-04-08 | DRG: 440 | Disposition: A | Discharge status: Home / Self Care | Attending: Internal Medicine | Admitting: Internal Medicine

## 2024-04-06 DIAGNOSIS — Z8249 Family history of ischemic heart disease and other diseases of the circulatory system: Secondary | ICD-10-CM

## 2024-04-06 DIAGNOSIS — D3502 Benign neoplasm of left adrenal gland: Secondary | ICD-10-CM | POA: Diagnosis present

## 2024-04-06 DIAGNOSIS — Z720 Tobacco use: Secondary | ICD-10-CM | POA: Diagnosis present

## 2024-04-06 DIAGNOSIS — K852 Alcohol induced acute pancreatitis without necrosis or infection: Principal | ICD-10-CM | POA: Diagnosis present

## 2024-04-06 DIAGNOSIS — F1020 Alcohol dependence, uncomplicated: Secondary | ICD-10-CM | POA: Diagnosis present

## 2024-04-06 DIAGNOSIS — F17290 Nicotine dependence, other tobacco product, uncomplicated: Secondary | ICD-10-CM | POA: Diagnosis present

## 2024-04-06 DIAGNOSIS — K859 Acute pancreatitis without necrosis or infection, unspecified: Secondary | ICD-10-CM | POA: Diagnosis present

## 2024-04-06 DIAGNOSIS — H543 Unqualified visual loss, both eyes: Secondary | ICD-10-CM | POA: Diagnosis present

## 2024-04-06 DIAGNOSIS — F1290 Cannabis use, unspecified, uncomplicated: Secondary | ICD-10-CM | POA: Diagnosis present

## 2024-04-06 DIAGNOSIS — I10 Essential (primary) hypertension: Secondary | ICD-10-CM | POA: Diagnosis present

## 2024-04-06 DIAGNOSIS — R739 Hyperglycemia, unspecified: Secondary | ICD-10-CM | POA: Diagnosis present

## 2024-04-06 DIAGNOSIS — K861 Other chronic pancreatitis: Secondary | ICD-10-CM | POA: Diagnosis not present

## 2024-04-06 DIAGNOSIS — Z833 Family history of diabetes mellitus: Secondary | ICD-10-CM | POA: Diagnosis not present

## 2024-04-06 DIAGNOSIS — Y900 Blood alcohol level of less than 20 mg/100 ml: Secondary | ICD-10-CM | POA: Diagnosis present

## 2024-04-06 DIAGNOSIS — K860 Alcohol-induced chronic pancreatitis: Secondary | ICD-10-CM | POA: Diagnosis present

## 2024-04-06 DIAGNOSIS — Z96643 Presence of artificial hip joint, bilateral: Secondary | ICD-10-CM | POA: Diagnosis present

## 2024-04-06 LAB — URINALYSIS, ROUTINE W REFLEX MICROSCOPIC
Bilirubin Urine: NEGATIVE
Glucose, UA: NEGATIVE mg/dL
Hgb urine dipstick: NEGATIVE
Ketones, ur: NEGATIVE mg/dL
Leukocytes,Ua: NEGATIVE
Nitrite: NEGATIVE
Protein, ur: NEGATIVE mg/dL
Specific Gravity, Urine: 1.026 (ref 1.005–1.030)
pH: 5 (ref 5.0–8.0)

## 2024-04-06 LAB — URINE DRUG SCREEN
Amphetamines: NEGATIVE
Barbiturates: NEGATIVE
Benzodiazepines: NEGATIVE
Cocaine: NEGATIVE
Fentanyl: NEGATIVE
Methadone Scn, Ur: NEGATIVE
Opiates: POSITIVE — AB
Tetrahydrocannabinol: POSITIVE — AB

## 2024-04-06 LAB — HEMOGLOBIN A1C
Hgb A1c MFr Bld: 6 % — ABNORMAL HIGH (ref 4.8–5.6)
Mean Plasma Glucose: 125.5 mg/dL

## 2024-04-06 LAB — COMPREHENSIVE METABOLIC PANEL WITH GFR
ALT: 73 U/L — ABNORMAL HIGH (ref 0–44)
AST: 180 U/L — ABNORMAL HIGH (ref 15–41)
Albumin: 4.3 g/dL (ref 3.5–5.0)
Alkaline Phosphatase: 170 U/L — ABNORMAL HIGH (ref 38–126)
Anion gap: 14 (ref 5–15)
BUN: 20 mg/dL (ref 6–20)
CO2: 24 mmol/L (ref 22–32)
Calcium: 9.2 mg/dL (ref 8.9–10.3)
Chloride: 103 mmol/L (ref 98–111)
Creatinine, Ser: 1.22 mg/dL (ref 0.61–1.24)
GFR, Estimated: >60 mL/min (ref >60)
Glucose, Bld: 217 mg/dL — ABNORMAL HIGH (ref 70–99)
Potassium: 3.9 mmol/L (ref 3.5–5.1)
Sodium: 141 mmol/L (ref 135–145)
Total Bilirubin: 0.4 mg/dL (ref 0.0–1.2)
Total Protein: 7.4 g/dL (ref 6.5–8.1)

## 2024-04-06 LAB — CBC
HCT: 42.1 % (ref 39.0–52.0)
Hemoglobin: 14.1 g/dL (ref 13.0–17.0)
MCH: 30.4 pg (ref 26.0–34.0)
MCHC: 33.5 g/dL (ref 30.0–36.0)
MCV: 90.7 fL (ref 80.0–100.0)
Platelets: 196 K/uL (ref 150–400)
RBC: 4.64 MIL/uL (ref 4.22–5.81)
RDW: 12.4 % (ref 11.5–15.5)
WBC: 5.8 K/uL (ref 4.0–10.5)
nRBC: 0 % (ref 0.0–0.2)

## 2024-04-06 LAB — TROPONIN T, HIGH SENSITIVITY: Troponin T High Sensitivity: <15 ng/L (ref 0–19)

## 2024-04-06 LAB — ETHANOL: Alcohol, Ethyl (B): <15 mg/dL (ref <15)

## 2024-04-06 LAB — LIPASE, BLOOD: Lipase: >2800 U/L — ABNORMAL HIGH (ref 11–51)

## 2024-04-06 LAB — HEPATITIS PANEL, ACUTE
HCV Ab: NONREACTIVE
Hep A IgM: NONREACTIVE
Hep B C IgM: NONREACTIVE
Hepatitis B Surface Ag: NONREACTIVE

## 2024-04-06 LAB — GLUCOSE, CAPILLARY: Glucose-Capillary: 109 mg/dL — ABNORMAL HIGH (ref 70–99)

## 2024-04-06 MED ORDER — HYDRALAZINE HCL 20 MG/ML IJ SOLN: 5.0000 mg | INTRAMUSCULAR | Status: DC | PRN

## 2024-04-06 MED ORDER — MORPHINE SULFATE (PF) 2 MG/ML IV SOLN
2.0000 mg | INTRAVENOUS | Status: DC | PRN
  Administered 2024-04-06 – 2024-04-07 (×4): 2 mg via INTRAVENOUS
  Filled 2024-04-06 (×4): qty 1

## 2024-04-06 MED ORDER — ACETAMINOPHEN 325 MG PO TABS: 650.0000 mg | ORAL_TABLET | Freq: Four times a day (QID) | ORAL | Status: DC | PRN

## 2024-04-06 MED ORDER — ONDANSETRON HCL 4 MG PO TABS
4.0000 mg | ORAL_TABLET | Freq: Four times a day (QID) | ORAL | Status: DC | PRN
  Administered 2024-04-07: 4 mg via ORAL
  Filled 2024-04-06: qty 1

## 2024-04-06 MED ORDER — OXYCODONE HCL 5 MG PO TABS
5.0000 mg | ORAL_TABLET | Freq: Four times a day (QID) | ORAL | Status: DC | PRN
  Administered 2024-04-06 – 2024-04-07 (×4): 5 mg via ORAL
  Filled 2024-04-06 (×5): qty 1

## 2024-04-06 MED ORDER — HYDROMORPHONE HCL 1 MG/ML IJ SOLN
0.5000 mg | Freq: Once | INTRAMUSCULAR | Status: AC
  Administered 2024-04-06: 0.5 mg via INTRAVENOUS
  Filled 2024-04-06: qty 1

## 2024-04-06 MED ORDER — METHOCARBAMOL 500 MG PO TABS
750.0000 mg | ORAL_TABLET | Freq: Every evening | ORAL | Status: DC | PRN
  Administered 2024-04-07 (×2): 750 mg via ORAL
  Filled 2024-04-06 (×2): qty 2

## 2024-04-06 MED ORDER — THIAMINE MONONITRATE 100 MG PO TABS
100.0000 mg | ORAL_TABLET | Freq: Every day | ORAL | Status: DC
  Administered 2024-04-06 – 2024-04-08 (×3): 100 mg via ORAL
  Filled 2024-04-06 (×3): qty 1

## 2024-04-06 MED ORDER — ADULT MULTIVITAMIN W/MINERALS CH
1.0000 | ORAL_TABLET | Freq: Every day | ORAL | Status: DC
  Administered 2024-04-06 – 2024-04-08 (×3): 1 via ORAL
  Filled 2024-04-06 (×3): qty 1

## 2024-04-06 MED ORDER — ACETAMINOPHEN 650 MG RE SUPP: 650.0000 mg | Freq: Four times a day (QID) | RECTAL | Status: DC | PRN

## 2024-04-06 MED ORDER — PANTOPRAZOLE SODIUM 40 MG IV SOLR: 40.0000 mg | Freq: Two times a day (BID) | INTRAVENOUS | Status: DC

## 2024-04-06 MED ORDER — FOLIC ACID 1 MG PO TABS
1.0000 mg | ORAL_TABLET | Freq: Every day | ORAL | Status: DC
  Administered 2024-04-06 – 2024-04-08 (×3): 1 mg via ORAL
  Filled 2024-04-06 (×3): qty 1

## 2024-04-06 MED ORDER — NICOTINE 14 MG/24HR TD PT24: 14.0000 mg | MEDICATED_PATCH | TRANSDERMAL | Status: DC

## 2024-04-06 MED ORDER — ONDANSETRON HCL 4 MG/2ML IJ SOLN
4.0000 mg | Freq: Once | INTRAMUSCULAR | Status: AC
  Administered 2024-04-06: 4 mg via INTRAVENOUS
  Filled 2024-04-06: qty 2

## 2024-04-06 MED ORDER — SUCRALFATE 1 G PO TABS
1.0000 g | ORAL_TABLET | Freq: Four times a day (QID) | ORAL | Status: DC
  Administered 2024-04-06 – 2024-04-08 (×8): 1 g via ORAL
  Filled 2024-04-06 (×9): qty 1

## 2024-04-06 MED ORDER — IOHEXOL 300 MG/ML  SOLN
100.0000 mL | Freq: Once | INTRAMUSCULAR | Status: AC | PRN
  Administered 2024-04-06: 100 mL via INTRAVENOUS

## 2024-04-06 MED ORDER — AMLODIPINE BESYLATE 10 MG PO TABS
10.0000 mg | ORAL_TABLET | Freq: Every day | ORAL | Status: DC
  Administered 2024-04-06 – 2024-04-08 (×3): 10 mg via ORAL
  Filled 2024-04-06 (×3): qty 1

## 2024-04-06 MED ORDER — MORPHINE SULFATE (PF) 4 MG/ML IV SOLN
4.0000 mg | Freq: Once | INTRAVENOUS | Status: AC
  Administered 2024-04-06: 4 mg via INTRAVENOUS
  Filled 2024-04-06: qty 1

## 2024-04-06 MED ORDER — SODIUM CHLORIDE 0.9 % IV BOLUS
500.0000 mL | Freq: Once | INTRAVENOUS | Status: AC
  Administered 2024-04-06: 500 mL via INTRAVENOUS

## 2024-04-06 MED ORDER — LORAZEPAM 1 MG PO TABS: 1.0000 mg | ORAL_TABLET | ORAL | Status: DC | PRN

## 2024-04-06 MED ORDER — ONDANSETRON HCL 4 MG/2ML IJ SOLN: 4.0000 mg | Freq: Four times a day (QID) | INTRAMUSCULAR | Status: DC | PRN

## 2024-04-06 MED ORDER — PANTOPRAZOLE SODIUM 40 MG IV SOLR
40.0000 mg | INTRAVENOUS | Status: DC
  Administered 2024-04-07 – 2024-04-08 (×2): 40 mg via INTRAVENOUS
  Filled 2024-04-06 (×2): qty 10

## 2024-04-06 MED ORDER — THIAMINE HCL 100 MG/ML IJ SOLN: 100.0000 mg | Freq: Every day | INTRAMUSCULAR | Status: DC

## 2024-04-06 MED ORDER — LORAZEPAM 2 MG/ML IJ SOLN: 1.0000 mg | INTRAMUSCULAR | Status: DC | PRN

## 2024-04-06 MED ORDER — LACTATED RINGERS IV SOLN: INTRAVENOUS | Status: AC

## 2024-04-06 MED ORDER — LACTATED RINGERS IV SOLN: INTRAVENOUS | Status: DC

## 2024-04-06 NOTE — TOC Initial Note (Signed)
 Transition of Care [facility]) - Initial/Assessment Note    Patient Details  Name: Austin [name] MRN: [id number] Date of Birth: 04-04-1966  Transition of [facility]) CM/SW Contact:    Sonda Manuella Quill, RN Phone Number: 04/06/2024, 5:57 PM  Clinical Narrative:                 IP CM consult for SA counseling/education; spoke w/ pt in room; pt said he lives at home; he plans to return at d/c; pt identified POC brother Ishmail Mcmanamon [phone]); pt said he is not sure if his family will be able to provide transportation, and he cannot afford to pay; he verified insurance/PCP; pt denied SDOH risks; he has cane for visually impaired; pt says he has shower chair, and BSC; pt said he does not have HH services, or home oxygen ; pt encouraged to contact family to arrange transportation; pt declined receiving resources for SA counseling/education; pt said he already has this information; IP CM will follow.  Expected Discharge Plan: Home/Self Care Barriers to Discharge: Continued Medical Work up   Patient Goals and CMS Choice Patient states their goals for this hospitalization and ongoing recovery are:: home          Expected Discharge Plan and Services   Discharge Planning Services: CM Consult   Living arrangements for the past 2 months: Single Family Home                 DME Arranged: N/A DME Agency: NA       HH Arranged: NA HH Agency: NA        Prior Living Arrangements/Services Living arrangements for the past 2 months: Single Family Home Lives with:: Self Patient language and need for interpreter reviewed:: Yes Do you feel safe going back to the place where you live?: Yes      Need for Family Participation in Patient Care: Yes (Comment) Care giver support system in place?: Yes (comment) Current home services: DME (cane for visually impaired) Criminal Activity/Legal Involvement Pertinent to Current Situation/Hospitalization: No - Comment as needed  Activities of Daily  Living   ADL Screening (condition at time of admission) Independently performs ADLs?: Yes (appropriate for developmental age) Is the patient deaf or have difficulty hearing?: No Does the patient have difficulty seeing, even when wearing glasses/contacts?: Yes (legally blind) Does the patient have difficulty concentrating, remembering, or making decisions?: No  Permission Sought/Granted Permission sought to share information with : Case Manager Permission granted to share information with : Yes, Verbal Permission Granted  Share Information with NAME: Case Manager     Permission granted to share info w Relationship: Greyden Besecker (brother) [phone]     Emotional Assessment Appearance:: Appears stated age Attitude/Demeanor/Rapport: Gracious Affect (typically observed): Accepting Orientation: : Oriented to Self, Oriented to Place, Oriented to  Time, Oriented to Situation Alcohol / Substance Use: Alcohol Use Psych Involvement: No (comment)  Admission diagnosis:  Acute pancreatitis [K85.90] Alcohol-induced acute pancreatitis without infection or necrosis [K85.20] Patient Active Problem List   Diagnosis Date Noted   Adrenal adenoma, left 04/06/2024   Hyperglycemia 04/06/2024   Tobacco use 04/06/2024   Marijuana use 04/06/2024   Elevated blood pressure reading 12/17/2023   Pain due to onychomycosis of toenails of both feet 07/26/2022   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   Alcohol abuse 07/01/2022   Alcohol use disorder, severe, dependence (HCC) 07/01/2022   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   Alcohol abuse, daily use 03/12/2022   Alcohol use disorder,  moderate, dependence (HCC) 03/12/2022   Alcohol intoxication with moderate or severe use disorder, uncomplicated (HCC) 03/04/2022   Hypomagnesemia 01/12/2022   Hypokalemia 01/12/2022   Alcohol withdrawal syndrome without complication (HCC) 09/07/2021   Thrombocytopenia 09/07/2021   Elevated LFTs  09/07/2021   Passive suicidal ideations 09/07/2021   Acute alcoholic pancreatitis 08/22/2020   Hx of total hip arthroplasty, 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   History of total hip arthroplasty, 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   Acute on chronic pancreatitis (HCC) 10/05/2019   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   Intractable nausea and vomiting 12/29/2018   Alcoholic pancreatitis 04/14/2018   Essential hypertension 04/14/2018   Blindness of both eyes 04/14/2018   Alcohol use 04/14/2018   Hyponatremia 04/14/2018   RUQ pain    Abnormal CT of the abdomen    History of corneal transplant 10/19/2012   Retained orthopedic hardware 08/07/2011    Class: Chronic   PCP:  Leron Penne DASEN, PA-C Pharmacy:   [facility] [place], [place] - [address] [facility] [address] [place] [address] Phone: [phone] Fax: [phone]  [facility] [address] [place] [address] Phone: [phone] Fax: [phone]     Social Drivers of Health (SDOH) Social History: SDOH Screenings   Food Insecurity: No Food Insecurity (04/06/2024)  Housing: Low Risk  (04/06/2024)  Transportation Needs: No Transportation Needs (04/06/2024)  Utilities: Not At Risk (04/06/2024)  Depression (PHQ2-9): Low Risk  (10/21/2022)  Recent Concern: Depression (PHQ2-9) - Medium Risk (08/10/2022)  Financial Resource Strain: Low Risk  (12/06/2023)   Received from [facility]  Tobacco Use: High Risk (04/03/2024)   Received from [facility]   SDOH Interventions: Food Insecurity Interventions: Intervention Not Indicated, Inpatient TOC Housing Interventions: Intervention Not Indicated, Inpatient TOC Transportation Interventions: Intervention Not Indicated, Inpatient TOC Utilities Interventions: Intervention Not Indicated, Inpatient TOC   Readmission Risk Interventions    04/06/2024    5:54 PM 12/19/2023   11:06 AM  Readmission Risk  Prevention Plan  Post Dischage Appt  Complete  Medication Screening  Complete  Transportation Screening Complete Complete  PCP or Specialist Appt within 5-7 Days Complete   Home Care Screening Complete   Medication Review (RN CM) Complete

## 2024-04-06 NOTE — Assessment & Plan Note (Signed)
 Continue amlodipine

## 2024-04-06 NOTE — H&P (Signed)
 History and Physical    PatientARIA PICKRELL [name]:[id number] DOB: May 07, 1966 DOA: 04/06/2024 DOS: the patient was seen and examined on 04/06/2024 PCP: Leron Penne DASEN, PA-C  Patient coming from: Home - lives alone; NOK: Austin [name], [phone]   Chief Complaint: Abdominal pain  HPI: Austin [name] is a 58 y.o. male with medical history significant of ETOH use d/o, glaucoma with chronic visual impairment, and alcoholic pancreatitis who presented on 10/31 with abdominal pain.  He reports last Sunday he had some bad cooking - a ribeye steak, 3 hours later he started feeling bad.  He went to work Monday and got sick, had to go home.  He was in so much pain he went to his PCP Tuesday.  CT Wednesday with pancreatitis, reported Thursday.  PCP said if things worsened he should come in.  He has had pain all week.  About 0100 he got up to the bathroom and drank broth and took pain pill but his R abdomen was hurting too much to sleep.  He ate cheese grits and took another pain pill but he had more pain, nausea, and SOB so he decided to call 911.  Last emesis was Thursday AM, has been belching and had nausea since then.  Last ETOH was Monday.  He drank heavily last weekend, particularly Sunday.  His entire R abdomen is swollen with pain all the way tot he shoulder blade.    ER Course:  Abdominal pain.  CT with acute on chronic pancreatitis.  Lipase >2800.     Review of Systems: As mentioned in the history of present illness. All other systems reviewed and are negative. Past Medical History:  Diagnosis Date   Alcohol abuse    Anxiety    Arthritis    Blind in both eyes    sees lights and shadows   Elevated LFTs    Foot and toe(s), blister    bilat feet, blistering between toes   GERD (gastroesophageal reflux disease)    Glaucoma    Glaucoma as birth trauma    Headache(784.0)    Insomnia    Pancreatitis    UTI (lower urinary tract infection) 08/04/2011   finishing abx tomorrow    Weight loss    25 lb wt loss since 08/2010   Wrist fracture, right    Past Surgical History:  Procedure Laterality Date   COLONOSCOPY  2006?   ESOPHAGOGASTRODUODENOSCOPY     ESOPHAGOGASTRODUODENOSCOPY (EGD) WITH PROPOFOL  N/A 05/09/2017   Procedure: ESOPHAGOGASTRODUODENOSCOPY (EGD) WITH PROPOFOL ;  Surgeon: Austin Victory LITTIE DOUGLAS, MD;  Location: [facility];  Service: Gastroenterology;  Laterality: N/A;   EYE SURGERY Left    EYE SURGERY Right    prostetic eye   foot srugery     removal of bone near small toe   FRACTURE SURGERY Right    wrist   HARDWARE REMOVAL  08/06/2011   Procedure: HARDWARE REMOVAL;  Surgeon: Austin LULLA Sage, MD;  Location: [facility];  Service: Orthopedics;  Laterality: Right;  Removal Deep Retained Hardware Right Distal Radius   MULTIPLE TOOTH EXTRACTIONS     for dentures   TOTAL HIP ARTHROPLASTY Left 06/06/2019   Procedure: LEFT TOTAL HIP ARTHROPLASTY-DIRECT ANTERIOR;  Surgeon: Austin Oneil BROCKS, MD;  Location: [facility] OR;  Service: Orthopedics;  Laterality: Left;   TOTAL HIP ARTHROPLASTY Right 01/07/2020   Procedure: RIGHT TOTAL HIP ARTHROPLASTY DIRECT ANTERIOR;  Surgeon: Austin Oneil BROCKS, MD;  Location: [facility];  Service: Orthopedics;  Laterality: Right;  UPPER GASTROINTESTINAL ENDOSCOPY     Social History:  reports that he has been smoking cigars. He has never used smokeless tobacco. He reports current alcohol use. He reports that he does not currently use drugs after having used the following drugs: Marijuana. Frequency: 7.00 times per week.  Allergies  Allergen Reactions   Chlorhexidine  Rash and Other (See Comments)    Foley D/Ced this AM 01/08/2020    Family History  Problem Relation Age of Onset   Diabetes Sister    Diabetes Maternal Aunt        aunt and uncles   Hypertension Sister        aunt and uncles maternal side   Colon cancer Neg Hx    Colon polyps Neg Hx    Esophageal cancer Neg Hx    Rectal cancer Neg Hx    Stomach cancer Neg Hx     Prior to Admission  medications   Medication Sig Start Date End Date Taking? Authorizing Provider  amLODipine  (NORVASC ) 10 MG tablet Take 1 tablet (10 mg total) by mouth daily. 12/20/23 12/19/24  Pokhrel, Laxman, MD  cyclobenzaprine  (FLEXERIL ) 10 MG tablet Take 10 mg by mouth 3 (three) times daily as needed for muscle spasms. 12/06/23   [provider]  Multiple Vitamin (MULTIVITAMIN WITH MINERALS) TABS tablet Take 1 tablet by mouth daily. 12/21/23   Pokhrel, Laxman, MD  nicotine  (NICODERM CQ  - DOSED IN MG/24 HOURS) 14 mg/24hr patch Place 1 patch (14 mg total) onto the skin daily. 12/20/23   Pokhrel, Laxman, MD  omeprazole  (PRILOSEC) 40 MG capsule Take 40 mg by mouth daily. 12/06/23   [provider]  ondansetron  (ZOFRAN -ODT) 4 MG disintegrating tablet Take 1 tablet (4 mg total) by mouth every 6 (six) hours as needed for nausea or vomiting. 10/25/23   [name], Austin H, PA-C  oxyCODONE  (OXY IR/ROXICODONE ) 5 MG immediate release tablet Take 1 tablet (5 mg total) by mouth every 6 (six) hours as needed for moderate pain (pain score 4-6) or severe pain (pain score 7-10). 12/20/23   Sonjia Held, MD    Physical Exam: Vitals:   04/06/24 1115 04/06/24 1130 04/06/24 1146 04/06/24 1209  BP:   (!) 143/93 135/85  Pulse: 62 65 65 63  Resp: (!) 8 16 16 16   Temp:   97.9 F (36.6 C) 98.7 F (37.1 C)  TempSrc:   Oral   SpO2: 98% 96% 98% 99%   General:  Appears calm and comfortable and is in NAD Eyes:  R globe prosthetic, L chronic visual impairment ENT:  grossly normal hearing, lips & tongue, mmm Neck:  no LAD, masses or thyromegaly Cardiovascular:  RRR, no m/r/g. No LE edema.  Respiratory:   CTA bilaterally with no wheezes/rales/rhonchi.  Normal respiratory effort. Abdomen:  soft, mild generalized TTP that is worse in epigastric region, ND Skin:  no rash or induration seen on limited exam Musculoskeletal:  grossly normal tone BUE/BLE, good ROM, no bony abnormality Psychiatric:  grossly normal mood and  affect, speech fluent and appropriate, AOx3 Neurologic:  CN 2-12 grossly intact, moves all extremities in coordinated fashion   Radiological Exams on Admission: Independently reviewed - see discussion in A/P where applicable  US  Abdomen Limited RUQ (LIVER/GB) Result Date: 04/06/2024 EXAM: Right Upper Quadrant Abdominal Ultrasound 04/06/2024 08:58:19 AM CLINICAL HISTORY: Right upper quadrant pain. TECHNIQUE: Real-time ultrasonography of the right upper quadrant of the abdomen was performed. COMPARISON: US  Abdomen 10/25/2023. FINDINGS: LIVER: The liver demonstrates normal echogenicity. No intrahepatic biliary  ductal dilatation. No evidence of mass. BILIARY SYSTEM: Gallbladder wall thickness measures 2.2 mm. No pericholecystic fluid. No cholelithiasis. Negative sonographic Murphy's sign. The common bile duct measures 3.9 mm. OTHER: No right upper quadrant ascites. IMPRESSION: 1. No significant abnormality identified. Electronically signed by: Ryan Salvage MD 04/06/2024 09:11 AM EDT RP Workstation: [id number]   DG Chest 2 View Result Date: 04/06/2024 EXAM: 2 VIEW(S) XRAY OF THE CHEST 04/06/2024 08:06:00 AM COMPARISON: 07/17/2023 CLINICAL HISTORY: chest pain FINDINGS: LUNGS AND PLEURA: Low lung volumes are present, causing crowding of the pulmonary vasculature. Airway thickening suggest bronchitis or reactive airways disease. Linear subsegmental atelectasis or scarring at the left lung base. No focal pulmonary opacity. No pulmonary edema. No pleural effusion. No pneumothorax. HEART AND MEDIASTINUM: No acute abnormality of the cardiac and mediastinal silhouettes. BONES AND SOFT TISSUES: No acute osseous abnormality. IMPRESSION: 1. Airway thickening suggestive of bronchitis or reactive airways disease. 2. Low lung volumes with vascular crowding. 3. Linear subsegmental atelectasis or scarring at the left lung base. Electronically signed by: Ryan Salvage MD 04/06/2024 09:10 AM EDT RP Workstation:  [id number]   CT ABDOMEN PELVIS W CONTRAST Result Date: 04/06/2024 EXAM: CT ABDOMEN AND PELVIS WITH CONTRAST 04/06/2024 08:05:22 AM TECHNIQUE: CT of the abdomen and pelvis was performed with the administration of 100 mL of iohexol  (OMNIPAQUE ) 300 MG/ML solution. Multiplanar reformatted images are provided for review. Automated exposure control, iterative reconstruction, and/or weight-based adjustment of the mA/kV was utilized to reduce the radiation dose to as low as reasonably achievable. COMPARISON: None available. CLINICAL HISTORY: Abdominal pain, acute, nonlocalized; RLQ abdominal pain. FINDINGS: LOWER CHEST: No acute abnormality. LIVER: The liver is unremarkable. GALLBLADDER AND BILE DUCTS: Gallbladder is unremarkable. No biliary ductal dilatation. SPLEEN: No acute abnormality. PANCREAS: Calcification of the head of the pancreas suggesting prior pancreatic inflammation. No pancreatic ductal dilatation. No acute inflammation. ADRENAL GLANDS: Nodule of the left adrenal gland measures 15 mm. On delayed imaging, the nodule demonstrates greater than 50 percent washout consistent with a benign adenoma. KIDNEYS, URETERS AND BLADDER: No stones in the kidneys or ureters. No hydronephrosis. No perinephric or periureteral stranding. Urinary bladder is unremarkable. GI AND BOWEL: Stomach demonstrates no acute abnormality. The appendix is not identified, but there are no secondary signs of appendicitis. No bowel obstruction or inflammation. PERITONEUM AND RETROPERITONEUM: No ascites. No free air. VASCULATURE: Atherosclerotic calcification of the aorta. LYMPH NODES: No lymphadenopathy. REPRODUCTIVE ORGANS: No acute abnormality. BONES AND SOFT TISSUES: No aggressive osseous lesion. Bilateral hip prosthetics. No focal soft tissue abnormality. IMPRESSION: 1. No acute findings in the abdomen or pelvis. 2. Left adrenal adenoma; no follow-up imaging recommended per CT washout characteristics. Electronically signed by: Norleen Boxer MD 04/06/2024 09:04 AM EDT RP Workstation: [id number]    EKG: Independently reviewed.  NSR with rate 74; nonspecific ST changes with no evidence of acute ischemia   Labs on Admission: I have personally reviewed the available labs and imaging studies at the time of the admission.  Pertinent labs:    Glucose 217 AP 170 Lipase >2800 AST 180/ALT 73 Troponin <15 Normal CBC Normal UA   Assessment and Plan: Principal Problem:   Acute on chronic pancreatitis (HCC) Active Problems:   Alcohol use disorder, severe, dependence (HCC)   Essential hypertension   Blindness of both eyes   Adrenal adenoma, left   Hyperglycemia   Tobacco use   Marijuana use    Assessment and Plan: * Acute on chronic pancreatitis (HCC) H/o both acute and chronic pancreatitis Presented with abdominal  pain, nausea > vomiting Seen on 10/29 (CareEverywhere) with imaging suggestive of acute on chronic pancreatitis Symptoms worsened but imaging actually appears to be better now Lipase is still markedly elevated Very likely acute on chronic pancreatitis related to ETOH Strict cessation encouraged Will admit to med surg Appears to be fairly mild clinically so will order CLD IVF hydration for at least the first 12 hours with LR  Pain control with oxycodone /morphine   Nausea control with Zofran   Alcohol use disorder, severe, dependence (HCC) Patient with chronic ETOH dependence, reports social drinking with friends for the game but drank Friday-Monday with reported heavy drinking on Sunday H/o withdrawal, suspect that he uses more regularly and heavily than his current history indicates BAL on admission: <15; reports he last drank Monday He is at risk for complications of withdrawal including seizures, DTs Will admit to med surg for now CIWA protocol Folate, thiamine , and MVI ordered Will provide symptom-triggered BZD (ativan  per CIWA protocol) only since the patient is able to communicate; is not  showing current signs of delirium TOC team consulted Will also check UDS (acknowledges THC use, denies other drugs) Elevated LFTs are likely related to alcoholism Consider offering a medication for Alcohol Use Disorder at the time of d/c, to include Disulfuram; Naltrexone; or Acamprosate.  Essential hypertension Continue amlodipine   Marijuana use Cessation encouraged; this should be encouraged on an ongoing basis UDS ordered   Tobacco use Encourage cessation.   This was discussed with the patient and should be reviewed on an ongoing basis.   Patch ordered   Hyperglycemia No reported h/o DM Will check A1c Suspect that this is an acute phase reaction to pancreatitis although A1c was 5.8 with pre-DM in 06/2022 so preDM vs. DM is a consideration  Adrenal adenoma, left Imaging indicates that this is a stable/benign lesion No follow-up imaging is recommended  Blindness of both eyes Chronic, unchanged       Advance Care Planning:   Code Status: Full Code - Code status was discussed with the patient at the time of admission.  The patient would want to receive full resuscitative measures at this time.    Consultants: None   Procedures: None   Antibiotics: None    DVT Prophylaxis: SCDs  Family Communication: None present; we spoke with his brother by telephone during the encounter  Severity of Illness: Admit - It is my clinical opinion that admission to INPATIENT is reasonable and necessary because of the expectation that this patient will require hospital care that crosses at least 2 midnights to treat this condition based on the medical complexity of the problems presented.  Given the aforementioned information, the predictability of an adverse outcome is felt to be significant.   Author: Delon Herald, MD 04/06/2024 2:10 PM  For on call review [email or web address].

## 2024-04-06 NOTE — ED Provider Triage Note (Signed)
 Emergency Medicine Provider Triage Evaluation Note  Austin [name] , a 58 y.o. male  was evaluated in triage.  Pt complains of right lower quadrant abdominal pain.  Patient reports this been ongoing for quite some time.  Endorsing nausea without vomiting.  Denies any diarrhea or fevers.  Endorsing a little bit of burning with urination.  Patient was seen by PCP on 10/28 and had outpatient CT scan obtained on 10/29 which showed acute on chronic pancreatitis, abnormally dilated appendix but no periappendiceal inflammation however could be indicative of early appendicitis.  Patient reports that he was placed on antibiotics for this.  States he has been taking them.  Review of Systems  Positive:  Negative:   Physical Exam  BP (!) 154/97 (BP Location: Right Arm)   Pulse 79   Temp 98.3 F (36.8 C) (Oral)   Resp (!) 21   SpO2 99%  Gen:   Awake, no distress   Resp:  Normal effort  MSK:   Moves extremities without difficulty  Other:    Medical Decision Making  Medically screening exam initiated at 6:01 AM.  Appropriate orders placed.  Key R Hoaglund was informed that the remainder of the evaluation will be completed by another provider, this initial triage assessment does not replace that evaluation, and the importance of remaining in the ED until their evaluation is complete.     Ruthell Lonni FALCON, PA-C 04/06/24 [phone]

## 2024-04-06 NOTE — ED Triage Notes (Signed)
 Pt BIB [facility] from home c/o sharp stabbing right sided abdominal pain that radiates to right shoulder and right flank. Ongoing over the past 5 days. Associated with nausea, emesis x1, and chills.

## 2024-04-06 NOTE — Assessment & Plan Note (Signed)
 Imaging indicates that this is a stable/benign lesion No follow-up imaging is recommended

## 2024-04-06 NOTE — Plan of Care (Signed)
  Problem: Pain Managment: Goal: General experience of comfort will improve and/or be controlled Outcome: Progressing   Problem: Safety: Goal: Ability to remain free from injury will improve Outcome: Progressing   Problem: Skin Integrity: Goal: Risk for impaired skin integrity will decrease Outcome: Progressing

## 2024-04-06 NOTE — Assessment & Plan Note (Signed)
-  Encourage cessation.   ?-This was discussed with the patient and should be reviewed on an ongoing basis.   ?-Patch ordered  ?

## 2024-04-06 NOTE — Assessment & Plan Note (Addendum)
 No reported h/o DM Will check A1c Suspect that this is an acute phase reaction to pancreatitis although A1c was 5.8 with pre-DM in 06/2022 so preDM vs. DM is a consideration

## 2024-04-06 NOTE — Assessment & Plan Note (Signed)
-  Cessation encouraged; this should be encouraged on an ongoing basis ?-UDS ordered ?

## 2024-04-06 NOTE — ED Triage Notes (Signed)
 Per note pt was seen at [facility] has a CT Abd with contrast on 10/29.

## 2024-04-06 NOTE — ED Provider Notes (Signed)
 [place] [facility] EMERGENCY DEPARTMENT AT [facility] Provider Note   CSN: [id number] Arrival date & time: 04/06/24  9457     Patient presents with: Abdominal Pain   Austin [name] is a 58 y.o. male with PMHx ETOH use disorder, HTN, blindness of both eyes, alcoholic pancreatitis,  who presents to ED concerned for right flank abdominal pain x5 days. Pain radiates around the side and towards the back. Patient also with an episode of vomiting a couple days ago which has since resolved although he continues to be nauseated. Patient also with diarrhea and states that it is related to the stool softeners that he was placed on recently. Patient stating that he went to another ED a couple of days ago with only the finding of an enlarged appendix without obvious surrounding signs of inflammation. Denies fever but endorses chills. Denies dysuria.   Patient is endorsing some intermittent, burning, lower/central chest pain. Chest pain happens at rest. Patient stating that his abdomen pain is making it difficult for him to take a full breath in, but it does not feel like dyspnea.   Patient stating that last ETOH consumption was 2 drinks 5 days ago. Patient stating that he is no longer a daily ETOH drinker.     Abdominal Pain      Prior to Admission medications   Medication Sig Start Date End Date Taking? Authorizing Provider  amLODipine  (NORVASC ) 10 MG tablet Take 1 tablet (10 mg total) by mouth daily. 12/20/23 12/19/24  Pokhrel, Laxman, MD  cyclobenzaprine  (FLEXERIL ) 10 MG tablet Take 10 mg by mouth 3 (three) times daily as needed for muscle spasms. 12/06/23   [provider]  Multiple Vitamin (MULTIVITAMIN WITH MINERALS) TABS tablet Take 1 tablet by mouth daily. 12/21/23   Pokhrel, Laxman, MD  nicotine  (NICODERM CQ  - DOSED IN MG/24 HOURS) 14 mg/24hr patch Place 1 patch (14 mg total) onto the skin daily. 12/20/23   Pokhrel, Laxman, MD  omeprazole  (PRILOSEC) 40 MG capsule Take 40 mg by mouth  daily. 12/06/23   [provider]  ondansetron  (ZOFRAN -ODT) 4 MG disintegrating tablet Take 1 tablet (4 mg total) by mouth every 6 (six) hours as needed for nausea or vomiting. 10/25/23   Prosperi, Christian H, PA-C  oxyCODONE  (OXY IR/ROXICODONE ) 5 MG immediate release tablet Take 1 tablet (5 mg total) by mouth every 6 (six) hours as needed for moderate pain (pain score 4-6) or severe pain (pain score 7-10). 12/20/23   Pokhrel, Vernal, MD    Allergies: Chlorhexidine     Review of Systems  Gastrointestinal:  Positive for abdominal pain.    Updated Vital Signs BP (!) 160/81   Pulse 67   Temp 98.3 F (36.8 C) (Oral)   Resp 13   SpO2 99%   Physical Exam Vitals and nursing note reviewed.  Constitutional:      General: He is not in acute distress.    Appearance: He is not ill-appearing or toxic-appearing.  HENT:     Head: Normocephalic and atraumatic.     Mouth/Throat:     Mouth: Mucous membranes are moist.     Pharynx: No posterior oropharyngeal erythema.  Eyes:     General: No scleral icterus.       Right eye: No discharge.        Left eye: No discharge.     Conjunctiva/sclera: Conjunctivae normal.  Cardiovascular:     Rate and Rhythm: Normal rate and regular rhythm.     Pulses: Normal pulses.  Heart sounds: Normal heart sounds. No murmur heard. Pulmonary:     Effort: Pulmonary effort is normal. No respiratory distress.     Breath sounds: Normal breath sounds. No wheezing, rhonchi or rales.  Abdominal:     General: Abdomen is flat. Bowel sounds are normal. There is no distension.     Palpations: Abdomen is soft. There is no mass.     Tenderness: There is abdominal tenderness.     Comments: Right flank tenderness to palpation  Musculoskeletal:     Right lower leg: No edema.     Left lower leg: No edema.  Skin:    General: Skin is warm and dry.     Findings: No rash.  Neurological:     General: No focal deficit present.     Mental Status: He is alert. Mental  status is at baseline.  Psychiatric:        Mood and Affect: Mood normal.     (all labs ordered are listed, but only abnormal results are displayed) Labs Reviewed  LIPASE, BLOOD - Abnormal; Notable for the following components:      Result Value   Lipase >2,800 (*)    All other components within normal limits  COMPREHENSIVE METABOLIC PANEL WITH GFR - Abnormal; Notable for the following components:   Glucose, Bld 217 (*)    AST 180 (*)    ALT 73 (*)    Alkaline Phosphatase 170 (*)    All other components within normal limits  CBC  URINALYSIS, ROUTINE W REFLEX MICROSCOPIC  ETHANOL  HEPATITIS PANEL, ACUTE  TROPONIN T, HIGH SENSITIVITY    EKG: EKG Interpretation Date/Time:  Friday April 06 2024 05:57:16 EDT Ventricular Rate:  74 PR Interval:  142 QRS Duration:  90 QT Interval:  391 QTC Calculation: 434 R Axis:   32  Text Interpretation: Sinus rhythm Borderline T abnormalities, inferior leads No significant change was found Confirmed by Carita Senior [phone]) on 04/06/2024 6:12:05 AM  Radiology: US  Abdomen Limited RUQ (LIVER/GB) Result Date: 04/06/2024 EXAM: Right Upper Quadrant Abdominal Ultrasound 04/06/2024 08:58:19 AM CLINICAL HISTORY: Right upper quadrant pain. TECHNIQUE: Real-time ultrasonography of the right upper quadrant of the abdomen was performed. COMPARISON: US  Abdomen 10/25/2023. FINDINGS: LIVER: The liver demonstrates normal echogenicity. No intrahepatic biliary ductal dilatation. No evidence of mass. BILIARY SYSTEM: Gallbladder wall thickness measures 2.2 mm. No pericholecystic fluid. No cholelithiasis. Negative sonographic Murphy's sign. The common bile duct measures 3.9 mm. OTHER: No right upper quadrant ascites. IMPRESSION: 1. No significant abnormality identified. Electronically signed by: Ryan Salvage MD 04/06/2024 09:11 AM EDT RP Workstation: [id number]   DG Chest 2 View Result Date: 04/06/2024 EXAM: 2 VIEW(S) XRAY OF THE CHEST 04/06/2024 08:06:00  AM COMPARISON: 07/17/2023 CLINICAL HISTORY: chest pain FINDINGS: LUNGS AND PLEURA: Low lung volumes are present, causing crowding of the pulmonary vasculature. Airway thickening suggest bronchitis or reactive airways disease. Linear subsegmental atelectasis or scarring at the left lung base. No focal pulmonary opacity. No pulmonary edema. No pleural effusion. No pneumothorax. HEART AND MEDIASTINUM: No acute abnormality of the cardiac and mediastinal silhouettes. BONES AND SOFT TISSUES: No acute osseous abnormality. IMPRESSION: 1. Airway thickening suggestive of bronchitis or reactive airways disease. 2. Low lung volumes with vascular crowding. 3. Linear subsegmental atelectasis or scarring at the left lung base. Electronically signed by: Ryan Salvage MD 04/06/2024 09:10 AM EDT RP Workstation: [id number]   CT ABDOMEN PELVIS W CONTRAST Result Date: 04/06/2024 EXAM: CT ABDOMEN AND PELVIS WITH CONTRAST 04/06/2024 08:05:22 AM TECHNIQUE: CT  of the abdomen and pelvis was performed with the administration of 100 mL of iohexol  (OMNIPAQUE ) 300 MG/ML solution. Multiplanar reformatted images are provided for review. Automated exposure control, iterative reconstruction, and/or weight-based adjustment of the mA/kV was utilized to reduce the radiation dose to as low as reasonably achievable. COMPARISON: None available. CLINICAL HISTORY: Abdominal pain, acute, nonlocalized; RLQ abdominal pain. FINDINGS: LOWER CHEST: No acute abnormality. LIVER: The liver is unremarkable. GALLBLADDER AND BILE DUCTS: Gallbladder is unremarkable. No biliary ductal dilatation. SPLEEN: No acute abnormality. PANCREAS: Calcification of the head of the pancreas suggesting prior pancreatic inflammation. No pancreatic ductal dilatation. No acute inflammation. ADRENAL GLANDS: Nodule of the left adrenal gland measures 15 mm. On delayed imaging, the nodule demonstrates greater than 50 percent washout consistent with a benign adenoma. KIDNEYS, URETERS  AND BLADDER: No stones in the kidneys or ureters. No hydronephrosis. No perinephric or periureteral stranding. Urinary bladder is unremarkable. GI AND BOWEL: Stomach demonstrates no acute abnormality. The appendix is not identified, but there are no secondary signs of appendicitis. No bowel obstruction or inflammation. PERITONEUM AND RETROPERITONEUM: No ascites. No free air. VASCULATURE: Atherosclerotic calcification of the aorta. LYMPH NODES: No lymphadenopathy. REPRODUCTIVE ORGANS: No acute abnormality. BONES AND SOFT TISSUES: No aggressive osseous lesion. Bilateral hip prosthetics. No focal soft tissue abnormality. IMPRESSION: 1. No acute findings in the abdomen or pelvis. 2. Left adrenal adenoma; no follow-up imaging recommended per CT washout characteristics. Electronically signed by: Norleen Boxer MD 04/06/2024 09:04 AM EDT RP Workstation: [id number]     Procedures   Medications Ordered in the ED  lactated ringers  infusion ( Intravenous New Bag/Given 04/06/24 0930)  HYDROmorphone  (DILAUDID ) injection 0.5 mg (has no administration in time range)  ondansetron  (ZOFRAN ) injection 4 mg (has no administration in time range)  ondansetron  (ZOFRAN ) injection 4 mg (4 mg Intravenous Given 04/06/24 0607)  sodium chloride  0.9 % bolus 500 mL (500 mLs Intravenous New Bag/Given 04/06/24 9391)  morphine  (PF) 4 MG/ML injection 4 mg (4 mg Intravenous Given 04/06/24 9391)  HYDROmorphone  (DILAUDID ) injection 0.5 mg (0.5 mg Intravenous Given 04/06/24 0709)  iohexol  (OMNIPAQUE ) 300 MG/ML solution 100 mL (100 mLs Intravenous Contrast Given 04/06/24 0744)                                    Medical Decision Making Amount and/or Complexity of Data Reviewed Labs: ordered. Radiology: ordered.  Risk Prescription drug management. Decision regarding hospitalization.    This patient presents to the ED for concern of abdominal pain, this involves an extensive number of treatment options, and is a complaint that  carries with it a high risk of complications and morbidity.  The differential diagnosis includes gastroenteritis, colitis, small bowel obstruction, appendicitis, cholecystitis, pancreatitis, nephrolithiasis, UTI, pyelonephritis   Co morbidities that complicate the patient evaluation  ETOH use disorder, HTN, blindness of both eyes, alcoholic pancreatitis   Additional history obtained:  10/28 CT scan: Abnormally dilated appendix, but no periappendiceal inflammation.  This could indicate early appendicitis, and appendiceal UCO, or could potentially be congenital.  Acute on chronic pancreatitis.  Fluid-filled loops of small bowel and colon may be due to diarrheal illness.   Problem List / ED Course / Critical interventions / Medication management  Patient presented for abdominal pain.  Physical exam with right flank tenderness to palpation.  Rest of physical exam reassuring.  Patient afebrile with stable vitals. I Ordered, and personally interpreted labs.  UA not concerning  for infection.  CBC without leukocytosis or anemia.  CMP with hyperglycemia 217.  Liver enzymes are also diffusely elevated.  Lipase >2,800.  Troponin within normal limits.  EtOH and hepatitis panel pending. I ordered imaging studies including CT Abd/Pelvis/RUQ US , and chest xray. I independently visualized and interpreted imaging and I agree with the radiologist interpretation of no acute process. I personally viewed and interpreted the EKG/cardiac monitored which showed an underlying rhythm of: sinus rhythm. Shared results with patient.  Answered all questions.  I believe patient's acute pancreatitis flare today is due to recent EtOH consumption.  Patient stating that he still has pain and nausea and would like to be admitted for further management.  I feel this is appropriate. I requested consultation with the Hospitalist on-call Dr. Will,  and discussed lab and imaging findings as well as pertinent plan - they agree to  admit patient.  I have reviewed the patients home medicines and have made adjustments as needed The patient has been appropriately medically screened and/or stabilized in the ED. I have low suspicion for any other emergent medical condition which would require further screening, evaluation or treatment in the ED or require inpatient management. At time of discharge the patient is hemodynamically stable and in no acute distress. I have discussed work-up results and diagnosis with patient and answered all questions. Patient is agreeable with discharge plan. We discussed strict return precautions for returning to the emergency department and they verbalized understanding.      Social Determinants of Health:  none       Final diagnoses:  Alcohol-induced acute pancreatitis without infection or necrosis    ED Discharge Orders     None          Hoy Nidia FALCON, [place] 04/06/24 9052    Charlyn Sora, MD 04/07/24 [phone]

## 2024-04-06 NOTE — Assessment & Plan Note (Signed)
 Patient with chronic ETOH dependence, reports social drinking with friends for the game but drank Friday-Monday with reported heavy drinking on Sunday H/o withdrawal, suspect that he uses more regularly and heavily than his current history indicates BAL on admission: <15; reports he last drank Monday He is at risk for complications of withdrawal including seizures, DTs Will admit to med surg for now CIWA protocol Folate, thiamine , and MVI ordered Will provide symptom-triggered BZD (ativan  per CIWA protocol) only since the patient is able to communicate; is not showing current signs of delirium TOC team consulted Will also check UDS (acknowledges THC use, denies other drugs) Elevated LFTs are likely related to alcoholism Consider offering a medication for Alcohol Use Disorder at the time of d/c, to include Disulfuram; Naltrexone; or Acamprosate.

## 2024-04-06 NOTE — Assessment & Plan Note (Signed)
Chronic,unchanged.

## 2024-04-06 NOTE — Assessment & Plan Note (Signed)
 H/o both acute and chronic pancreatitis Presented with abdominal pain, nausea > vomiting Seen on 10/29 ([facility]) with imaging suggestive of acute on chronic pancreatitis Symptoms worsened but imaging actually appears to be better now Lipase is still markedly elevated Very likely acute on chronic pancreatitis related to ETOH Strict cessation encouraged Will admit to med surg Appears to be fairly mild clinically so will order CLD IVF hydration for at least the first 12 hours with LR  Pain control with oxycodone /morphine   Nausea control with Zofran 

## 2024-04-07 DIAGNOSIS — K861 Other chronic pancreatitis: Secondary | ICD-10-CM | POA: Diagnosis not present

## 2024-04-07 DIAGNOSIS — K859 Acute pancreatitis without necrosis or infection, unspecified: Secondary | ICD-10-CM | POA: Diagnosis not present

## 2024-04-07 LAB — CBC
HCT: 35.5 % — ABNORMAL LOW (ref 39.0–52.0)
Hemoglobin: 12.2 g/dL — ABNORMAL LOW (ref 13.0–17.0)
MCH: 31.5 pg (ref 26.0–34.0)
MCHC: 34.4 g/dL (ref 30.0–36.0)
MCV: 91.7 fL (ref 80.0–100.0)
Platelets: 152 K/uL (ref 150–400)
RBC: 3.87 MIL/uL — ABNORMAL LOW (ref 4.22–5.81)
RDW: 12.7 % (ref 11.5–15.5)
WBC: 4.4 K/uL (ref 4.0–10.5)
nRBC: 0 % (ref 0.0–0.2)

## 2024-04-07 LAB — COMPREHENSIVE METABOLIC PANEL WITH GFR
ALT: 178 U/L — ABNORMAL HIGH (ref 0–44)
AST: 180 U/L — ABNORMAL HIGH (ref 15–41)
Albumin: 3.8 g/dL (ref 3.5–5.0)
Alkaline Phosphatase: 146 U/L — ABNORMAL HIGH (ref 38–126)
Anion gap: 8 (ref 5–15)
BUN: 6 mg/dL (ref 6–20)
CO2: 28 mmol/L (ref 22–32)
Calcium: 8.7 mg/dL — ABNORMAL LOW (ref 8.9–10.3)
Chloride: 99 mmol/L (ref 98–111)
Creatinine, Ser: 0.95 mg/dL (ref 0.61–1.24)
GFR, Estimated: >60 mL/min (ref >60)
Glucose, Bld: 109 mg/dL — ABNORMAL HIGH (ref 70–99)
Potassium: 3.7 mmol/L (ref 3.5–5.1)
Sodium: 136 mmol/L (ref 135–145)
Total Bilirubin: 0.7 mg/dL (ref 0.0–1.2)
Total Protein: 6.3 g/dL — ABNORMAL LOW (ref 6.5–8.1)

## 2024-04-07 LAB — LIPASE, BLOOD: Lipase: 115 U/L — ABNORMAL HIGH (ref 11–51)

## 2024-04-07 NOTE — Progress Notes (Signed)
 PROGRESS NOTE    Austin [name]  FMW:[id number] DOB: Aug 18, 1965 DOA: 04/06/2024 PCP: Leron Penne DASEN, PA-C   Brief Narrative:  58 y.o. male with medical history significant of ETOH use d/o, glaucoma with chronic visual impairment, and alcoholic pancreatitis who presented on 10/31 with abdominal pain.  He reports last Sunday he had some bad cooking - a ribeye steak, 3 hours later he started feeling bad.  He went to work Monday and got sick, had to go home.  He was in so much pain he went to his PCP Tuesday.  CT Wednesday with pancreatitis, reported Thursday.  PCP said if things worsened he should come in.  He has had pain all week.  About 0100 he got up to the bathroom and drank broth and took pain pill but his R abdomen was hurting too much to sleep.  He ate cheese grits and took another pain pill but he had more pain, nausea, and SOB so he decided to call 911.  Last emesis was Thursday AM, has been belching and had nausea since then.  Last ETOH was Monday.  He drank heavily last weekend, particularly Sunday.  His entire R abdomen is swollen with pain all the way tot he shoulder blade.   CT with acute on chronic pancreatitis.  Lipase >2800.     Assessment & Plan:   Principal Problem:   Acute on chronic pancreatitis (HCC) Active Problems:   Alcohol use disorder, severe, dependence (HCC)   Essential hypertension   Blindness of both eyes   Adrenal adenoma, left   Hyperglycemia   Tobacco use   Marijuana use    Acute on chronic pancreatitis (HCC) H/o both acute and chronic pancreatitis Presented with abdominal pain, nausea > vomiting Seen on 10/29 ([facility]) with imaging suggestive of acute on chronic pancreatitis Symptoms worsened but imaging actually appears to be better now Lipase is still markedly elevated, today's labs pending Very likely acute on chronic pancreatitis related to ETOH Strict cessation encouraged Will advance diet to soft diet Pain control with Oxy  Alcohol  use disorder, severe, dependence (HCC) Patient with chronic ETOH dependence, reports social drinking with friends for the game but drank Friday-Monday with reported heavy drinking on Sunday H/o withdrawal, suspect that he uses more regularly and heavily than his current history indicates BAL on admission: <15; reports he last drank Monday He is at risk for complications of withdrawal including seizures, Dts CIWA protocol Folate, thiamine , and MVI ordered Will provide symptom-triggered BZD (ativan  per CIWA protocol) only since the patient is able to communicate; is not showing current signs of delirium UDS positive for opiates and THC.   Essential hypertension Continue amlodipine    Marijuana use Cessation encouraged; this should be encouraged on an ongoing basis  Tobacco use Encourage cessation.   This was discussed with the patient and should be reviewed on an ongoing basis.   Patch ordered    Hyperglycemia CBG (last 3)  Recent Labs    04/06/24 2043  GLUCAP 109*    No reported h/o DM Will check A1c 6.0 Suspect that this is an acute phase reaction to pancreatitis although A1c was 5.8 with pre-DM in 06/2022 so preDM vs. DM is a consideration   Adrenal adenoma, left Imaging indicates that this is a stable/benign lesion No follow-up imaging is recommended   Blindness of both eyes Chronic, unchanged   Estimated body mass index is 21.99 kg/m as calculated from the following:   Height as of 12/17/23: 5' 7 (  1.702 m).   Weight as of 12/17/23: 63.7 kg.  DVT prophylaxis:lovenox  Code Status: full Family Communication: none Disposition Plan:  Status is: Inpatient Remains inpatient appropriate because: acute illness   Consultants: none  Procedures: none Antimicrobials: none  Subjective:  Feels better than yesterday pain improved requesting to start eating Objective: Vitals:   04/06/24 1146 04/06/24 1209 04/06/24 1923 04/07/24 0340  BP: (!) 143/93 135/85 (!) 147/90 (!)  145/90  Pulse: 65 63 61 62  Resp: 16 16 18 18   Temp: 97.9 F (36.6 C) 98.7 F (37.1 C) 98.4 F (36.9 C) 98.5 F (36.9 C)  TempSrc: Oral  Oral   SpO2: 98% 99% 100% 94%    Intake/Output Summary (Last 24 hours) at 04/07/2024 0901 Last data filed at 04/07/2024 0348 Gross per 24 hour  Intake 480 ml  Output 1300 ml  Net -820 ml   There were no vitals filed for this visit.  Examination:  General exam: Appears in no acute distress  respiratory system: Clear to auscultation. Respiratory effort normal. Cardiovascular system: S1 & S2 heard, RRR. No JVD, murmurs, rubs, gallops or clicks. No pedal edema. Gastrointestinal system: Soft decreased tenderness  Central nervous system: Alert and oriented. No focal neurological deficits. Extremities: No edema   Data Reviewed: I have personally reviewed following labs and imaging studies  CBC: Recent Labs  Lab 04/06/24 0558 04/07/24 0642  WBC 5.8 4.4  HGB 14.1 12.2*  HCT 42.1 35.5*  MCV 90.7 91.7  PLT 196 152   Basic Metabolic Panel: Recent Labs  Lab 04/06/24 0558 04/07/24 0642  NA 141 136  K 3.9 3.7  CL 103 99  CO2 24 28  GLUCOSE 217* 109*  BUN 20 6  CREATININE 1.22 0.95  CALCIUM  9.2 8.7*   GFR: CrCl cannot be calculated (Unknown ideal weight.). Liver Function Tests: Recent Labs  Lab 04/06/24 0558 04/07/24 0642  AST 180* 180*  ALT 73* 178*  ALKPHOS 170* 146*  BILITOT 0.4 0.7  PROT 7.4 6.3*  ALBUMIN 4.3 3.8   Recent Labs  Lab 04/06/24 0558  LIPASE >2,800*   No results for input(s): AMMONIA in the last 168 hours. Coagulation Profile: No results for input(s): INR, PROTIME in the last 168 hours. Cardiac Enzymes: No results for input(s): CKTOTAL, CKMB, CKMBINDEX, TROPONINI in the last 168 hours. BNP (last 3 results) No results for input(s): PROBNP in the last 8760 hours. HbA1C: Recent Labs    04/06/24 0558  HGBA1C 6.0*   CBG: Recent Labs  Lab 04/06/24 2043  GLUCAP 109*   Lipid  Profile: No results for input(s): CHOL, HDL, LDLCALC, TRIG, CHOLHDL, LDLDIRECT in the last 72 hours. Thyroid  Function Tests: No results for input(s): TSH, T4TOTAL, FREET4, T3FREE, THYROIDAB in the last 72 hours. Anemia Panel: No results for input(s): VITAMINB12, FOLATE, FERRITIN, TIBC, IRON, RETICCTPCT in the last 72 hours. Sepsis Labs: No results for input(s): PROCALCITON, LATICACIDVEN in the last 168 hours.  No results found for this or any previous visit (from the past 240 hours).       Radiology Studies: US  Abdomen Limited RUQ (LIVER/GB) Result Date: 04/06/2024 EXAM: Right Upper Quadrant Abdominal Ultrasound 04/06/2024 08:58:19 AM CLINICAL HISTORY: Right upper quadrant pain. TECHNIQUE: Real-time ultrasonography of the right upper quadrant of the abdomen was performed. COMPARISON: US  Abdomen 10/25/2023. FINDINGS: LIVER: The liver demonstrates normal echogenicity. No intrahepatic biliary ductal dilatation. No evidence of mass. BILIARY SYSTEM: Gallbladder wall thickness measures 2.2 mm. No pericholecystic fluid. No cholelithiasis. Negative sonographic Murphy's sign. The  common bile duct measures 3.9 mm. OTHER: No right upper quadrant ascites. IMPRESSION: 1. No significant abnormality identified. Electronically signed by: Ryan Salvage MD 04/06/2024 09:11 AM EDT RP Workstation: [id number]   DG Chest 2 View Result Date: 04/06/2024 EXAM: 2 VIEW(S) XRAY OF THE CHEST 04/06/2024 08:06:00 AM COMPARISON: 07/17/2023 CLINICAL HISTORY: chest pain FINDINGS: LUNGS AND PLEURA: Low lung volumes are present, causing crowding of the pulmonary vasculature. Airway thickening suggest bronchitis or reactive airways disease. Linear subsegmental atelectasis or scarring at the left lung base. No focal pulmonary opacity. No pulmonary edema. No pleural effusion. No pneumothorax. HEART AND MEDIASTINUM: No acute abnormality of the cardiac and mediastinal silhouettes. BONES AND  SOFT TISSUES: No acute osseous abnormality. IMPRESSION: 1. Airway thickening suggestive of bronchitis or reactive airways disease. 2. Low lung volumes with vascular crowding. 3. Linear subsegmental atelectasis or scarring at the left lung base. Electronically signed by: Ryan Salvage MD 04/06/2024 09:10 AM EDT RP Workstation: [id number]   CT ABDOMEN PELVIS W CONTRAST Result Date: 04/06/2024 EXAM: CT ABDOMEN AND PELVIS WITH CONTRAST 04/06/2024 08:05:22 AM TECHNIQUE: CT of the abdomen and pelvis was performed with the administration of 100 mL of iohexol  (OMNIPAQUE ) 300 MG/ML solution. Multiplanar reformatted images are provided for review. Automated exposure control, iterative reconstruction, and/or weight-based adjustment of the mA/kV was utilized to reduce the radiation dose to as low as reasonably achievable. COMPARISON: None available. CLINICAL HISTORY: Abdominal pain, acute, nonlocalized; RLQ abdominal pain. FINDINGS: LOWER CHEST: No acute abnormality. LIVER: The liver is unremarkable. GALLBLADDER AND BILE DUCTS: Gallbladder is unremarkable. No biliary ductal dilatation. SPLEEN: No acute abnormality. PANCREAS: Calcification of the head of the pancreas suggesting prior pancreatic inflammation. No pancreatic ductal dilatation. No acute inflammation. ADRENAL GLANDS: Nodule of the left adrenal gland measures 15 mm. On delayed imaging, the nodule demonstrates greater than 50 percent washout consistent with a benign adenoma. KIDNEYS, URETERS AND BLADDER: No stones in the kidneys or ureters. No hydronephrosis. No perinephric or periureteral stranding. Urinary bladder is unremarkable. GI AND BOWEL: Stomach demonstrates no acute abnormality. The appendix is not identified, but there are no secondary signs of appendicitis. No bowel obstruction or inflammation. PERITONEUM AND RETROPERITONEUM: No ascites. No free air. VASCULATURE: Atherosclerotic calcification of the aorta. LYMPH NODES: No lymphadenopathy.  REPRODUCTIVE ORGANS: No acute abnormality. BONES AND SOFT TISSUES: No aggressive osseous lesion. Bilateral hip prosthetics. No focal soft tissue abnormality. IMPRESSION: 1. No acute findings in the abdomen or pelvis. 2. Left adrenal adenoma; no follow-up imaging recommended per CT washout characteristics. Electronically signed by: Norleen Boxer MD 04/06/2024 09:04 AM EDT RP Workstation: [id number]     Scheduled Meds:  amLODipine   10 mg Oral Daily   folic acid   1 mg Oral Daily   multivitamin with minerals  1 tablet Oral Daily   pantoprazole  (PROTONIX ) IV  40 mg Intravenous Q24H   sucralfate   1 g Oral QID   thiamine   100 mg Oral Daily   Or   thiamine   100 mg Intravenous Daily   Continuous Infusions:  lactated ringers  75 mL/hr at 04/06/24 0930   lactated ringers  200 mL/hr at 04/07/24 0332     LOS: 1 day   Almarie KANDICE Hoots, MD 04/07/2024, 9:01 AM

## 2024-04-07 NOTE — Plan of Care (Signed)

## 2024-04-07 NOTE — Plan of Care (Signed)
  Problem: Education: Goal: Knowledge of General Education information will improve Description: Including pain rating scale, medication(s)/side effects and non-pharmacologic comfort measures Outcome: Progressing   Problem: Health Behavior/Discharge Planning: Goal: Ability to manage health-related needs will improve Outcome: Progressing   Problem: Clinical Measurements: Goal: Ability to maintain clinical measurements within normal limits will improve Outcome: Progressing Goal: Will remain free from infection Outcome: Progressing   Problem: Activity: Goal: Risk for activity intolerance will decrease Outcome: Progressing   Problem: Elimination: Goal: Will not experience complications related to bowel motility Outcome: Progressing Goal: Will not experience complications related to urinary retention Outcome: Progressing   Problem: Safety: Goal: Ability to remain free from injury will improve Outcome: Progressing   Problem: Skin Integrity: Goal: Risk for impaired skin integrity will decrease Outcome: Progressing   

## 2024-04-08 DIAGNOSIS — K861 Other chronic pancreatitis: Secondary | ICD-10-CM | POA: Diagnosis not present

## 2024-04-08 DIAGNOSIS — K859 Acute pancreatitis without necrosis or infection, unspecified: Secondary | ICD-10-CM | POA: Diagnosis not present

## 2024-04-08 LAB — CBC
HCT: 36.7 % — ABNORMAL LOW (ref 39.0–52.0)
Hemoglobin: 12.2 g/dL — ABNORMAL LOW (ref 13.0–17.0)
MCH: 30.8 pg (ref 26.0–34.0)
MCHC: 33.2 g/dL (ref 30.0–36.0)
MCV: 92.7 fL (ref 80.0–100.0)
Platelets: 160 K/uL (ref 150–400)
RBC: 3.96 MIL/uL — ABNORMAL LOW (ref 4.22–5.81)
RDW: 12.3 % (ref 11.5–15.5)
WBC: 4.2 K/uL (ref 4.0–10.5)
nRBC: 0 % (ref 0.0–0.2)

## 2024-04-08 LAB — COMPREHENSIVE METABOLIC PANEL WITH GFR
ALT: 117 U/L — ABNORMAL HIGH (ref 0–44)
AST: 75 U/L — ABNORMAL HIGH (ref 15–41)
Albumin: 3.7 g/dL (ref 3.5–5.0)
Alkaline Phosphatase: 130 U/L — ABNORMAL HIGH (ref 38–126)
Anion gap: 8 (ref 5–15)
BUN: 9 mg/dL (ref 6–20)
CO2: 26 mmol/L (ref 22–32)
Calcium: 9.2 mg/dL (ref 8.9–10.3)
Chloride: 99 mmol/L (ref 98–111)
Creatinine, Ser: 0.9 mg/dL (ref 0.61–1.24)
GFR, Estimated: >60 mL/min (ref >60)
Glucose, Bld: 113 mg/dL — ABNORMAL HIGH (ref 70–99)
Potassium: 3.9 mmol/L (ref 3.5–5.1)
Sodium: 133 mmol/L — ABNORMAL LOW (ref 135–145)
Total Bilirubin: 0.4 mg/dL (ref 0.0–1.2)
Total Protein: 6.5 g/dL (ref 6.5–8.1)

## 2024-04-08 LAB — LIPASE, BLOOD: Lipase: 84 U/L — ABNORMAL HIGH (ref 11–51)

## 2024-04-08 MED ORDER — GABAPENTIN 100 MG PO CAPS: 100.0000 mg | ORAL_CAPSULE | Freq: Two times a day (BID) | ORAL | 2 refills | Status: AC

## 2024-04-08 MED ORDER — FOLIC ACID 1 MG PO TABS: 1.0000 mg | ORAL_TABLET | Freq: Every day | ORAL | Status: AC

## 2024-04-08 MED ORDER — VITAMIN B-1 100 MG PO TABS: 100.0000 mg | ORAL_TABLET | Freq: Every day | ORAL | Status: AC

## 2024-04-08 MED ORDER — AMLODIPINE BESYLATE 10 MG PO TABS: 5.0000 mg | ORAL_TABLET | Freq: Every day | ORAL | 2 refills | Status: AC

## 2024-04-08 MED ORDER — ACETAMINOPHEN 325 MG PO TABS: 650.0000 mg | ORAL_TABLET | Freq: Four times a day (QID) | ORAL | Status: AC | PRN

## 2024-04-08 NOTE — Progress Notes (Signed)
 Patient provided discharge instructions to include medications and follow up care. Patient verbalizes understanding of instructions. Patient escorted to taxi by nursing staff.

## 2024-04-08 NOTE — Discharge Summary (Signed)
 Physician Discharge Summary  Austin [name] FMW:[id number] DOB: [birth year] DOA: 04/06/2024  PCP: Leron Penne DASEN, PA-C  Admit date: 04/06/2024 Discharge date: 04/08/2024  Admitted From: Home Disposition: Home   Recommendations for Outpatient Follow-up:  Follow up with PCP in 1-2 weeks Please obtain BMP/CBC in one week  Home Health: No Equipment/Devices none  Discharge Condition: Stable CODE STATUS: Full code Diet recommendation: Cardiac Brief/Interim Summary:  58 y.o. male with medical history significant of ETOH use d/o, glaucoma with chronic visual impairment, and alcoholic pancreatitis who presented on 10/31 with abdominal pain.  He reports last Sunday he had some bad cooking - a ribeye steak, 3 hours later he started feeling bad.  He went to work Monday and got sick, had to go home.  He was in so much pain he went to his PCP Tuesday.  CT Wednesday with pancreatitis, reported Thursday.  PCP said if things worsened he should come in.  He has had pain all week.  About 0100 he got up to the bathroom and drank broth and took pain pill but his R abdomen was hurting too much to sleep.  He ate cheese grits and took another pain pill but he had more pain, nausea, and SOB so he decided to call 911.  Last emesis was Thursday AM, has been belching and had nausea since then.  Last ETOH was Monday.  He drank heavily last weekend, particularly Sunday.  His entire R abdomen is swollen with pain all the way tot he shoulder blade.   CT with acute on chronic pancreatitis.  Lipase >2800.    Discharge Diagnoses:  Principal Problem:   Acute on chronic pancreatitis [facility]) Active Problems:   Alcohol use disorder, severe, dependence (HCC)   Essential hypertension   Blindness of both eyes   Adrenal adenoma, left   Hyperglycemia   Tobacco use   Marijuana use       Acute on chronic pancreatitis (HCC) H/o both acute and chronic pancreatitis Presented with abdominal pain, nausea > vomiting Seen on  10/29 ([facility]) with imaging suggestive of acute on chronic pancreatitis Symptoms worsened but imaging actually appears to be better now Lipase on discharge was 84 from over 2500 at the time of admission.  He was treated conservatively with n.p.o. IV fluids and pain control with improvement and was able to tolerate a diet prior to discharge.   Alcohol use disorder, severe, dependence (HCC) Patient with chronic ETOH dependence, reports social drinking with friends for the game but drank Friday-Monday with reported heavy drinking on Sunday H/o withdrawal, suspect that he uses more regularly and heavily than his current history indicates BAL on admission: <15; reports he last drank Monday He is at risk for complications of withdrawal including seizures, Dts CIWA protocol UDS positive for opiates and THC. Encouraged him to abstain from alcohol use given prescription for gabapentin .  Essential hypertension Continue amlodipine  at a lower dose of 5 mg daily   Marijuana use Cessation encouraged; this should be encouraged on an ongoing basis   Tobacco use Encourage cessation.   This was discussed with the patient and should be reviewed on an ongoing basis.   Patch ordered    Hyperglycemia CBG (last 3)  Recent Labs (last 2 labs)     Recent Labs    04/06/24 2043  GLUCAP 109*        No reported h/o DM Will check A1c 6.0 Suspect that this is an acute phase reaction to pancreatitis although A1c was 5.8  with pre-DM in 06/2022 so preDM vs. DM is a consideration   Adrenal adenoma, left Imaging indicates that this is a stable/benign lesion No follow-up imaging is recommended   Blindness of both eyes Chronic, unchanged   Estimated body mass index is 21.99 kg/m as calculated from the following:   Height as of 12/17/23: 5' 7 (1.702 m).   Weight as of 12/17/23: 63.7 kg.  Discharge Instructions  Discharge Instructions     Diet - low sodium heart healthy   Complete by: As  directed    Increase activity slowly   Complete by: As directed       Allergies as of 04/08/2024       Reactions   Chlorhexidine  Rash, Other (See Comments)   Foley D/Ced this AM 01/08/2020        Medication List     STOP taking these medications    nicotine  14 mg/24hr patch Commonly known as: NICODERM CQ  - dosed in mg/24 hours   ondansetron  4 MG disintegrating tablet Commonly known as: ZOFRAN -ODT   oxyCODONE  5 MG immediate release tablet Commonly known as: Oxy IR/ROXICODONE        TAKE these medications    acetaminophen  325 MG tablet Commonly known as: TYLENOL  Take 2 tablets (650 mg total) by mouth every 6 (six) hours as needed for mild pain (pain score 1-3) or fever (or Fever >/= 101).   amLODipine  10 MG tablet Commonly known as: NORVASC  Take 0.5 tablets (5 mg total) by mouth daily. What changed: how much to take   folic acid  1 MG tablet Commonly known as: FOLVITE  Take 1 tablet (1 mg total) by mouth daily.   gabapentin  100 MG capsule Commonly known as: Neurontin  Take 1 capsule (100 mg total) by mouth 2 (two) times daily.   methocarbamol  750 MG tablet Commonly known as: ROBAXIN  Take 750 mg by mouth at bedtime as needed for muscle spasms.   multivitamin with minerals Tabs tablet Take 1 tablet by mouth daily.   omeprazole  40 MG capsule Commonly known as: PRILOSEC Take 40 mg by mouth daily.   sucralfate  1 g tablet Commonly known as: CARAFATE  Take 1 g by mouth 4 (four) times daily.   thiamine  100 MG tablet Commonly known as: Vitamin B-1 Take 1 tablet (100 mg total) by mouth daily.        Follow-up Information     McCoy, Penne DASEN, PA-C Follow up.   Specialty: Physician Assistant Contact information: [address] [place] [address] [phone]                Allergies  Allergen Reactions   Chlorhexidine  Rash and Other (See Comments)    Foley D/Ced this AM 01/08/2020     Consultations: none   Procedures/Studies: US  Abdomen Limited RUQ (LIVER/GB) Result Date: 04/06/2024 EXAM: Right Upper Quadrant Abdominal Ultrasound 04/06/2024 08:58:19 AM CLINICAL HISTORY: Right upper quadrant pain. TECHNIQUE: Real-time ultrasonography of the right upper quadrant of the abdomen was performed. COMPARISON: US  Abdomen 10/25/2023. FINDINGS: LIVER: The liver demonstrates normal echogenicity. No intrahepatic biliary ductal dilatation. No evidence of mass. BILIARY SYSTEM: Gallbladder wall thickness measures 2.2 mm. No pericholecystic fluid. No cholelithiasis. Negative sonographic Murphy's sign. The common bile duct measures 3.9 mm. OTHER: No right upper quadrant ascites. IMPRESSION: 1. No significant abnormality identified. Electronically signed by: Ryan Salvage MD 04/06/2024 09:11 AM EDT RP Workstation: [id number]   DG Chest 2 View Result Date: 04/06/2024 EXAM: 2 VIEW(S) XRAY OF THE CHEST 04/06/2024 08:06:00 AM  COMPARISON: 07/17/2023 CLINICAL HISTORY: chest pain FINDINGS: LUNGS AND PLEURA: Low lung volumes are present, causing crowding of the pulmonary vasculature. Airway thickening suggest bronchitis or reactive airways disease. Linear subsegmental atelectasis or scarring at the left lung base. No focal pulmonary opacity. No pulmonary edema. No pleural effusion. No pneumothorax. HEART AND MEDIASTINUM: No acute abnormality of the cardiac and mediastinal silhouettes. BONES AND SOFT TISSUES: No acute osseous abnormality. IMPRESSION: 1. Airway thickening suggestive of bronchitis or reactive airways disease. 2. Low lung volumes with vascular crowding. 3. Linear subsegmental atelectasis or scarring at the left lung base. Electronically signed by: Ryan Salvage MD 04/06/2024 09:10 AM EDT RP Workstation: [id number]   CT ABDOMEN PELVIS W CONTRAST Result Date: 04/06/2024 EXAM: CT ABDOMEN AND PELVIS WITH CONTRAST 04/06/2024 08:05:22 AM TECHNIQUE: CT of the abdomen and pelvis was  performed with the administration of 100 mL of iohexol  (OMNIPAQUE ) 300 MG/ML solution. Multiplanar reformatted images are provided for review. Automated exposure control, iterative reconstruction, and/or weight-based adjustment of the mA/kV was utilized to reduce the radiation dose to as low as reasonably achievable. COMPARISON: None available. CLINICAL HISTORY: Abdominal pain, acute, nonlocalized; RLQ abdominal pain. FINDINGS: LOWER CHEST: No acute abnormality. LIVER: The liver is unremarkable. GALLBLADDER AND BILE DUCTS: Gallbladder is unremarkable. No biliary ductal dilatation. SPLEEN: No acute abnormality. PANCREAS: Calcification of the head of the pancreas suggesting prior pancreatic inflammation. No pancreatic ductal dilatation. No acute inflammation. ADRENAL GLANDS: Nodule of the left adrenal gland measures 15 mm. On delayed imaging, the nodule demonstrates greater than 50 percent washout consistent with a benign adenoma. KIDNEYS, URETERS AND BLADDER: No stones in the kidneys or ureters. No hydronephrosis. No perinephric or periureteral stranding. Urinary bladder is unremarkable. GI AND BOWEL: Stomach demonstrates no acute abnormality. The appendix is not identified, but there are no secondary signs of appendicitis. No bowel obstruction or inflammation. PERITONEUM AND RETROPERITONEUM: No ascites. No free air. VASCULATURE: Atherosclerotic calcification of the aorta. LYMPH NODES: No lymphadenopathy. REPRODUCTIVE ORGANS: No acute abnormality. BONES AND SOFT TISSUES: No aggressive osseous lesion. Bilateral hip prosthetics. No focal soft tissue abnormality. IMPRESSION: 1. No acute findings in the abdomen or pelvis. 2. Left adrenal adenoma; no follow-up imaging recommended per CT washout characteristics. Electronically signed by: Norleen Boxer MD 04/06/2024 09:04 AM EDT RP Workstation: [id number]   (Echo, Carotid, EGD, Colonoscopy, ERCP)    Subjective:  Awake alert ate regular diet anxious to go home no  increase in pain Discharge Exam: Vitals:   04/07/24 1956 04/08/24 0459  BP: (!) 149/86 137/88  Pulse: 78 66  Resp: 16 10  Temp: 98.4 F (36.9 C) 98.6 F (37 C)  SpO2: 100% 99%   Vitals:   04/07/24 0340 04/07/24 1216 04/07/24 1956 04/08/24 0459  BP: (!) 145/90 (!) 148/95 (!) 149/86 137/88  Pulse: 62 68 78 66  Resp: 18 18 16 10   Temp: 98.5 F (36.9 C) 98.7 F (37.1 C) 98.4 F (36.9 C) 98.6 F (37 C)  TempSrc:      SpO2: 94% 100% 100% 99%    General: Pt is alert, awake, not in acute distress Cardiovascular: RRR, S1/S2 +, no rubs, no gallops Respiratory: CTA bilaterally, no wheezing, no rhonchi Abdominal: Soft, NT, ND, bowel sounds + Extremities: no edema, no cyanosis    The results of significant diagnostics from this hospitalization (including imaging, microbiology, ancillary and laboratory) are listed below for reference.     Microbiology: No results found for this or any previous visit (from the past 240 hours).  Labs: BNP (last 3 results) No results for input(s): BNP in the last 8760 hours. Basic Metabolic Panel: Recent Labs  Lab 04/06/24 0558 04/07/24 0642 04/08/24 0612  NA 141 136 133*  K 3.9 3.7 3.9  CL 103 99 99  CO2 24 28 26   GLUCOSE 217* 109* 113*  BUN 20 6 9   CREATININE 1.22 0.95 0.90  CALCIUM  9.2 8.7* 9.2   Liver Function Tests: Recent Labs  Lab 04/06/24 0558 04/07/24 0642 04/08/24 0612  AST 180* 180* 75*  ALT 73* 178* 117*  ALKPHOS 170* 146* 130*  BILITOT 0.4 0.7 0.4  PROT 7.4 6.3* 6.5  ALBUMIN 4.3 3.8 3.7   Recent Labs  Lab 04/06/24 0558 04/07/24 0642 04/08/24 0612  LIPASE >2,800* 115* 84*   No results for input(s): AMMONIA in the last 168 hours. CBC: Recent Labs  Lab 04/06/24 0558 04/07/24 0642 04/08/24 0612  WBC 5.8 4.4 4.2  HGB 14.1 12.2* 12.2*  HCT 42.1 35.5* 36.7*  MCV 90.7 91.7 92.7  PLT 196 152 160   Cardiac Enzymes: No results for input(s): CKTOTAL, CKMB, CKMBINDEX, TROPONINI in the last 168  hours. BNP: Invalid input(s): POCBNP CBG: Recent Labs  Lab 04/06/24 2043  GLUCAP 109*   D-Dimer No results for input(s): DDIMER in the last 72 hours. Hgb A1c Recent Labs    04/06/24 0558  HGBA1C 6.0*   Lipid Profile No results for input(s): CHOL, HDL, LDLCALC, TRIG, CHOLHDL, LDLDIRECT in the last 72 hours. Thyroid  function studies No results for input(s): TSH, T4TOTAL, T3FREE, THYROIDAB in the last 72 hours.  Invalid input(s): FREET3 Anemia work up No results for input(s): VITAMINB12, FOLATE, FERRITIN, TIBC, IRON, RETICCTPCT in the last 72 hours. Urinalysis    Component Value Date/Time   COLORURINE YELLOW 04/06/2024 0631   APPEARANCEUR CLEAR 04/06/2024 0631   LABSPEC 1.026 04/06/2024 0631   PHURINE 5.0 04/06/2024 0631   GLUCOSEU NEGATIVE 04/06/2024 0631   HGBUR NEGATIVE 04/06/2024 0631   BILIRUBINUR NEGATIVE 04/06/2024 0631   KETONESUR NEGATIVE 04/06/2024 0631   PROTEINUR NEGATIVE 04/06/2024 0631   UROBILINOGEN 2.0 (H) 09/13/2016 1039   NITRITE NEGATIVE 04/06/2024 0631   LEUKOCYTESUR NEGATIVE 04/06/2024 0631   Sepsis Labs Recent Labs  Lab 04/06/24 0558 04/07/24 0642 04/08/24 0612  WBC 5.8 4.4 4.2   Microbiology No results found for this or any previous visit (from the past 240 hours).   Time coordinating discharge: 35 minutes SIGNED:   Almarie KANDICE Hoots, MD  [facility] 04/08/2024, 11:49 AM

## 2024-04-08 NOTE — Plan of Care (Signed)
  Problem: Education: Goal: Knowledge of General Education information will improve Description: Including pain rating scale, medication(s)/side effects and non-pharmacologic comfort measures Outcome: Progressing   Problem: Health Behavior/Discharge Planning: Goal: Ability to manage health-related needs will improve Outcome: Progressing   Problem: Clinical Measurements: Goal: Ability to maintain clinical measurements within normal limits will improve Outcome: Progressing Goal: Will remain free from infection Outcome: Progressing   Problem: Activity: Goal: Risk for activity intolerance will decrease Outcome: Progressing   Problem: Nutrition: Goal: Adequate nutrition will be maintained Outcome: Progressing   Problem: Coping: Goal: Level of anxiety will decrease Outcome: Progressing   Problem: Elimination: Goal: Will not experience complications related to bowel motility Outcome: Progressing Goal: Will not experience complications related to urinary retention Outcome: Progressing   Problem: Pain Managment: Goal: General experience of comfort will improve and/or be controlled Outcome: Progressing   Problem: Safety: Goal: Ability to remain free from injury will improve Outcome: Progressing   Problem: Skin Integrity: Goal: Risk for impaired skin integrity will decrease Outcome: Progressing

## 2024-04-08 NOTE — TOC Transition Note (Signed)
 Transition of Care [facility]) - Discharge Note   Patient Details  Name: Austin [name] MRN: [id number] Date of Birth: March 28, 1966  [facility]) CM/SW Contact:  Sonda Manuella Quill, RN Phone Number: 04/08/2024, 8:57 AM   Clinical Narrative:    D/C orders received; no IP CM needs.   Final next level of care: Home/Self Care Barriers to Discharge: No Barriers Identified   Patient Goals and CMS Choice Patient states their goals for this hospitalization and ongoing recovery are:: home          Discharge Placement                       Discharge Plan and Services Additional resources added to the After Visit Summary for     Discharge Planning Services: CM Consult            DME Arranged: N/A DME Agency: NA       HH Arranged: NA HH Agency: NA        Social Drivers of Health (SDOH) Interventions SDOH Screenings   Food Insecurity: No Food Insecurity (04/06/2024)  Housing: Low Risk  (04/06/2024)  Transportation Needs: No Transportation Needs (04/06/2024)  Utilities: Not At Risk (04/06/2024)  Depression (PHQ2-9): Low Risk  (10/21/2022)  Recent Concern: Depression (PHQ2-9) - Medium Risk (08/10/2022)  Financial Resource Strain: Low Risk  (12/06/2023)   Received from [facility]  Tobacco Use: High Risk (04/03/2024)   Received from [facility]     Readmission Risk Interventions    04/06/2024    5:54 PM 12/19/2023   11:06 AM  Readmission Risk Prevention Plan  Post Dischage Appt  Complete  Medication Screening  Complete  Transportation Screening Complete Complete  PCP or Specialist Appt within 5-7 Days Complete   Home Care Screening Complete   Medication Review (RN CM) Complete

## 2024-06-11 ENCOUNTER — Other Ambulatory Visit: Payer: Self-pay

## 2024-06-11 ENCOUNTER — Encounter: Payer: Self-pay | Admitting: Surgical

## 2024-06-11 ENCOUNTER — Ambulatory Visit: Admitting: Surgical

## 2024-06-11 DIAGNOSIS — M79605 Pain in left leg: Secondary | ICD-10-CM

## 2024-06-11 MED ORDER — TRAMADOL HCL 50 MG PO TABS: 50.0000 mg | ORAL_TABLET | Freq: Two times a day (BID) | ORAL | 0 refills | Status: AC | PRN

## 2024-06-17 ENCOUNTER — Encounter: Payer: Self-pay | Admitting: Surgical
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
## 2024-06-19 ENCOUNTER — Telehealth: Payer: Self-pay | Admitting: Surgical

## 2024-06-19 ENCOUNTER — Encounter: Payer: Self-pay | Admitting: Surgical

## 2024-06-19 ENCOUNTER — Other Ambulatory Visit

## 2024-06-19 NOTE — Telephone Encounter (Signed)
 FMLA forms received. IC,lmvm advising that we need a signed authorization and also $20.00 form fee. Advised pt to come by to pay and complete auth. Once received, I can complete form.

## 2024-06-20 ENCOUNTER — Telehealth: Payer: Self-pay | Admitting: Surgical

## 2024-06-20 NOTE — Telephone Encounter (Signed)
 Spoke with patient. He has no transportation to come in to sign auth and pay form fee. I accepted verbal authorization. Completed FMLA form and fax to number patient provided [phone]

## 2024-06-27 ENCOUNTER — Ambulatory Visit
Admission: RE | Admit: 2024-06-27 | Discharge: 2024-06-27 | Disposition: A | Source: Ambulatory Visit | Attending: Surgical

## 2024-06-27 DIAGNOSIS — M79605 Pain in left leg: Secondary | ICD-10-CM

## 2024-07-09 ENCOUNTER — Ambulatory Visit: Admitting: Surgical

## 2024-07-27 ENCOUNTER — Ambulatory Visit: Admitting: Surgical
